# Patient Record
Sex: Female | Born: 1960 | Race: White | Hispanic: No | Smoking: Current every day smoker
Health system: Southern US, Community
[De-identification: ages and names within clinical notes are randomized; demographics above are authoritative.]

## PROBLEM LIST (undated history)

## (undated) DIAGNOSIS — C801 Malignant (primary) neoplasm, unspecified: Secondary | ICD-10-CM

## (undated) DIAGNOSIS — M1990 Unspecified osteoarthritis, unspecified site: Secondary | ICD-10-CM

## (undated) DIAGNOSIS — J4489 Other specified chronic obstructive pulmonary disease: Secondary | ICD-10-CM

## (undated) DIAGNOSIS — Z720 Tobacco use: Secondary | ICD-10-CM

## (undated) DIAGNOSIS — I10 Essential (primary) hypertension: Secondary | ICD-10-CM

## (undated) DIAGNOSIS — N951 Menopausal and female climacteric states: Secondary | ICD-10-CM

## (undated) DIAGNOSIS — Z8709 Personal history of other diseases of the respiratory system: Secondary | ICD-10-CM

## (undated) DIAGNOSIS — D649 Anemia, unspecified: Secondary | ICD-10-CM

## (undated) DIAGNOSIS — J449 Chronic obstructive pulmonary disease, unspecified: Secondary | ICD-10-CM

## (undated) DIAGNOSIS — F419 Anxiety disorder, unspecified: Secondary | ICD-10-CM

## (undated) DIAGNOSIS — R0600 Dyspnea, unspecified: Secondary | ICD-10-CM

## (undated) DIAGNOSIS — R011 Cardiac murmur, unspecified: Secondary | ICD-10-CM

## (undated) DIAGNOSIS — F329 Major depressive disorder, single episode, unspecified: Secondary | ICD-10-CM

## (undated) DIAGNOSIS — F32A Depression, unspecified: Secondary | ICD-10-CM

## (undated) DIAGNOSIS — E7800 Pure hypercholesterolemia, unspecified: Secondary | ICD-10-CM

## (undated) HISTORY — PX: LYMPH NODE BIOPSY: SHX201

## (undated) HISTORY — DX: Tobacco use: Z72.0

## (undated) HISTORY — DX: Anxiety disorder, unspecified: F41.9

## (undated) HISTORY — DX: Other specified chronic obstructive pulmonary disease: J44.89

## (undated) HISTORY — DX: Essential (primary) hypertension: I10

## (undated) HISTORY — PX: CERVICAL CONE BIOPSY: SUR198

## (undated) HISTORY — PX: TONSILLECTOMY: SUR1361

## (undated) HISTORY — PX: LIPOMA EXCISION: SHX5283

## (undated) HISTORY — DX: Chronic obstructive pulmonary disease, unspecified: J44.9

## (undated) HISTORY — PX: DILATION AND CURETTAGE OF UTERUS: SHX78

## (undated) HISTORY — DX: Pure hypercholesterolemia, unspecified: E78.00

## (undated) HISTORY — DX: Menopausal and female climacteric states: N95.1

---

## 2005-07-07 ENCOUNTER — Emergency Department: Payer: Self-pay | Admitting: Internal Medicine

## 2007-11-13 ENCOUNTER — Ambulatory Visit: Payer: Self-pay | Admitting: Internal Medicine

## 2007-11-13 LAB — PULMONARY FUNCTION TEST

## 2010-05-11 ENCOUNTER — Ambulatory Visit: Payer: Self-pay | Admitting: Internal Medicine

## 2010-10-03 DIAGNOSIS — E119 Type 2 diabetes mellitus without complications: Secondary | ICD-10-CM | POA: Insufficient documentation

## 2011-05-23 ENCOUNTER — Encounter: Payer: Self-pay | Admitting: Internal Medicine

## 2011-05-23 ENCOUNTER — Ambulatory Visit (INDEPENDENT_AMBULATORY_CARE_PROVIDER_SITE_OTHER): Payer: PRIVATE HEALTH INSURANCE | Admitting: Internal Medicine

## 2011-05-23 DIAGNOSIS — I10 Essential (primary) hypertension: Secondary | ICD-10-CM

## 2011-05-23 DIAGNOSIS — R5381 Other malaise: Secondary | ICD-10-CM

## 2011-05-23 DIAGNOSIS — J449 Chronic obstructive pulmonary disease, unspecified: Secondary | ICD-10-CM

## 2011-05-23 DIAGNOSIS — E785 Hyperlipidemia, unspecified: Secondary | ICD-10-CM

## 2011-05-23 DIAGNOSIS — R5383 Other fatigue: Secondary | ICD-10-CM

## 2011-05-23 MED ORDER — CITALOPRAM HYDROBROMIDE 20 MG PO TABS: 20.0000 mg | ORAL_TABLET | Freq: Every day | ORAL | Status: DC

## 2011-05-23 MED ORDER — PREDNISONE (PAK) 10 MG PO TABS: ORAL_TABLET | ORAL | Status: AC

## 2011-05-23 MED ORDER — RAMIPRIL 10 MG PO TABS: 10.0000 mg | ORAL_TABLET | Freq: Every day | ORAL | Status: DC

## 2011-05-23 MED ORDER — METHYLPREDNISOLONE SODIUM SUCC 40 MG IJ SOLR: 40.0000 mg | Freq: Once | INTRAMUSCULAR | Status: DC

## 2011-05-23 MED ORDER — ALBUTEROL SULFATE HFA 108 (90 BASE) MCG/ACT IN AERS: 2.0000 | INHALATION_SPRAY | Freq: Four times a day (QID) | RESPIRATORY_TRACT | Status: DC | PRN

## 2011-05-23 MED ORDER — METHYLPREDNISOLONE ACETATE 40 MG/ML IJ SUSP
40.0000 mg | Freq: Once | INTRAMUSCULAR | Status: AC
  Administered 2011-05-23: 40 mg via INTRAMUSCULAR

## 2011-05-23 MED ORDER — FLUTICASONE-SALMETEROL 250-50 MCG/DOSE IN AEPB: 1.0000 | INHALATION_SPRAY | Freq: Two times a day (BID) | RESPIRATORY_TRACT | Status: DC

## 2011-05-23 MED ORDER — ALBUTEROL SULFATE (5 MG/ML) 0.5% IN NEBU: 2.5000 mg | INHALATION_SOLUTION | Freq: Four times a day (QID) | RESPIRATORY_TRACT | Status: DC | PRN

## 2011-05-23 MED ORDER — DOXYCYCLINE HYCLATE 100 MG PO TABS: 100.0000 mg | ORAL_TABLET | Freq: Two times a day (BID) | ORAL | Status: AC

## 2011-05-23 NOTE — Progress Notes (Signed)
  Subjective:    Patient ID: Christine Sparks, female    DOB: 12-Aug-1961, 50 y.o.   MRN: [id number]  HPI Current symptoms include acute dyspnea, cough productive of yellow sputum in small amounts and wheezing. Symptoms have been present since several weeks ago and have been unchanged. She denies dyspnea, cough, fatigue and increased sputum. Associated symptoms include poor exercise tolerance and wheezing.  This episode appears to have been triggered by heat. Treatments tried for the current exacerbation: albuterol inhaler. The patient has been having similar episodes for approximately 5 years.   Review of Systems  Constitutional: Positive for activity change and fatigue.  HENT: Negative for ear pain, congestion, neck pain and sinus pressure.   Eyes: Negative for pain.  Respiratory: Positive for cough, chest tightness, shortness of breath and wheezing.   Cardiovascular: Negative for chest pain, palpitations and leg swelling.  Gastrointestinal: Negative for constipation, blood in stool and abdominal distention.  Genitourinary: Negative for urgency and frequency.  Musculoskeletal: Negative for myalgias and back pain.  Skin: Negative for pallor and rash.  Neurological: Negative for dizziness, syncope and light-headedness.  Hematological: Negative for adenopathy. Does not bruise/bleed easily.  Psychiatric/Behavioral: Negative for confusion and sleep disturbance.       Objective:   Physical Exam  Constitutional: She is oriented to person, place, and time. She appears well-developed and well-nourished. No distress.  HENT:  Head: Normocephalic.  Eyes: EOM are normal. Pupils are equal, round, and reactive to light.  Neck: Neck supple. No JVD present. No thyromegaly present.  Cardiovascular: Normal rate, regular rhythm and normal heart sounds.   Pulmonary/Chest: Effort normal. No stridor. No respiratory distress. She has wheezes. She has no rales.       Diminished bs bilaterally  Abdominal: Soft.  Bowel sounds are normal.  Musculoskeletal: She exhibits no edema.  Lymphadenopathy:    She has no cervical adenopathy.  Neurological: She is alert and oriented to person, place, and time.  Skin: Skin is warm and dry. She is not diaphoretic.  Psychiatric: She has a normal mood and affect.          Assessment & Plan:  COPD  Exacerbation:  Steroids, bronchodilators, and doxycycline rx/d.  CXR ordered due to tobacco history.  Hypertension:  continue ramipril for goal 130/80. No changes today.

## 2011-05-27 ENCOUNTER — Ambulatory Visit: Payer: Self-pay | Admitting: Internal Medicine

## 2011-05-27 ENCOUNTER — Telehealth: Payer: Self-pay | Admitting: Internal Medicine

## 2011-05-27 MED ORDER — AZITHROMYCIN 500 MG PO TABS: 500.0000 mg | ORAL_TABLET | Freq: Every day | ORAL | Status: AC

## 2011-05-27 NOTE — Telephone Encounter (Signed)
Instead of the dixycyline, you can call her in azithromycin 500 mg one tablet daily for 7 days  #7

## 2011-05-27 NOTE — Telephone Encounter (Signed)
Patient notified- Rx called to pharmacy. 

## 2011-05-27 NOTE — Telephone Encounter (Signed)
Patient called and stated she can't take anymore of the doxycycline you prescribed on Monday because it has made her extremely constipated, then today she has had diarrhea.  She stated she still needs something called in for the sinus infection it was originally prescribed for.  Please advise

## 2011-05-30 ENCOUNTER — Encounter: Payer: Self-pay | Admitting: Internal Medicine

## 2011-06-17 ENCOUNTER — Encounter: Payer: Self-pay | Admitting: Internal Medicine

## 2011-07-06 ENCOUNTER — Other Ambulatory Visit: Payer: Self-pay | Admitting: Internal Medicine

## 2011-07-06 MED ORDER — LORAZEPAM 0.5 MG PO TABS: 0.5000 mg | ORAL_TABLET | Freq: Two times a day (BID) | ORAL | Status: DC | PRN

## 2011-07-27 ENCOUNTER — Other Ambulatory Visit: Payer: Self-pay | Admitting: Internal Medicine

## 2011-09-08 ENCOUNTER — Ambulatory Visit: Payer: Self-pay | Admitting: General Practice

## 2011-09-23 ENCOUNTER — Other Ambulatory Visit: Payer: Self-pay | Admitting: *Deleted

## 2011-09-23 MED ORDER — LORAZEPAM 0.5 MG PO TABS: 0.5000 mg | ORAL_TABLET | Freq: Two times a day (BID) | ORAL | Status: DC | PRN

## 2011-09-23 NOTE — Telephone Encounter (Signed)
rx called in to pharamcy 

## 2011-09-23 NOTE — Telephone Encounter (Signed)
Yes you can refill the ativan as listed in chart with #3 refills

## 2011-09-23 NOTE — Telephone Encounter (Signed)
Patient is requesting a refill of Ativan.  Is this okay to call in?

## 2011-11-07 ENCOUNTER — Other Ambulatory Visit: Payer: Self-pay | Admitting: *Deleted

## 2011-11-07 ENCOUNTER — Encounter: Payer: Self-pay | Admitting: Internal Medicine

## 2011-11-07 ENCOUNTER — Ambulatory Visit (INDEPENDENT_AMBULATORY_CARE_PROVIDER_SITE_OTHER): Payer: PRIVATE HEALTH INSURANCE | Admitting: Internal Medicine

## 2011-11-07 DIAGNOSIS — F419 Anxiety disorder, unspecified: Secondary | ICD-10-CM | POA: Insufficient documentation

## 2011-11-07 DIAGNOSIS — I10 Essential (primary) hypertension: Secondary | ICD-10-CM | POA: Insufficient documentation

## 2011-11-07 DIAGNOSIS — A499 Bacterial infection, unspecified: Secondary | ICD-10-CM

## 2011-11-07 DIAGNOSIS — N951 Menopausal and female climacteric states: Secondary | ICD-10-CM

## 2011-11-07 DIAGNOSIS — J329 Chronic sinusitis, unspecified: Secondary | ICD-10-CM | POA: Insufficient documentation

## 2011-11-07 DIAGNOSIS — B9689 Other specified bacterial agents as the cause of diseases classified elsewhere: Secondary | ICD-10-CM

## 2011-11-07 DIAGNOSIS — J4489 Other specified chronic obstructive pulmonary disease: Secondary | ICD-10-CM | POA: Insufficient documentation

## 2011-11-07 DIAGNOSIS — F17200 Nicotine dependence, unspecified, uncomplicated: Secondary | ICD-10-CM

## 2011-11-07 DIAGNOSIS — Z720 Tobacco use: Secondary | ICD-10-CM | POA: Insufficient documentation

## 2011-11-07 DIAGNOSIS — J449 Chronic obstructive pulmonary disease, unspecified: Secondary | ICD-10-CM | POA: Insufficient documentation

## 2011-11-07 DIAGNOSIS — E119 Type 2 diabetes mellitus without complications: Secondary | ICD-10-CM

## 2011-11-07 MED ORDER — AMOXICILLIN-POT CLAVULANATE 875-125 MG PO TABS: 1.0000 | ORAL_TABLET | Freq: Two times a day (BID) | ORAL | Status: AC

## 2011-11-07 MED ORDER — RAMIPRIL 10 MG PO TABS: 10.0000 mg | ORAL_TABLET | Freq: Every day | ORAL | Status: DC

## 2011-11-07 MED ORDER — BECLOMETHASONE DIPROPIONATE 80 MCG/ACT IN AERS: 1.0000 | INHALATION_SPRAY | Freq: Two times a day (BID) | RESPIRATORY_TRACT | Status: DC

## 2011-11-07 MED ORDER — PREDNISONE (PAK) 10 MG PO TABS: ORAL_TABLET | ORAL | Status: AC

## 2011-11-07 MED ORDER — LORAZEPAM 0.5 MG PO TABS: 0.5000 mg | ORAL_TABLET | Freq: Two times a day (BID) | ORAL | Status: DC | PRN

## 2011-11-07 NOTE — Assessment & Plan Note (Signed)
With congestion rhinitis conjunctivitis lalsting several weeks. Dictated by ongoing tobacco use. Prednisone given concurrent wheezing, Augmentin, and decongestants.

## 2011-11-07 NOTE — Progress Notes (Signed)
  Subjective:    Patient ID: Christine Sparks, female    DOB: 07/15/1961, 51 y.o.   MRN: [id number]  HPI  Christine Sparks is a 51 year old white female with a history of tobacco abuse and chronic bronchitis who presents with cough productive of clear sputum bowels and new onset conjunctivitis she was treated. She was treated in November for influenza by the health department doctor with apparently several rounds of antibiotics but not with Tamiflu. Patient does not remember what the final antibiotic was. Her other symptoms have improved but she continues to have a productive cough and rales. Since then she has become intolerant of all things scented and has had to remove them from her work area tissue resulting congestion and sneezing. She has no prior history of allergy testing. Current Outpatient Prescriptions on File Prior to Visit  Medication Sig Dispense Refill  . albuterol (PROVENTIL) (5 MG/ML) 0.5% nebulizer solution Take 2.5 mg by nebulization every 6 (six) hours as needed for wheezing.  20 mL  12  . albuterol (VENTOLIN HFA) 108 (90 BASE) MCG/ACT inhaler Inhale 2 puffs into the lungs every 6 (six) hours as needed.  3 Inhaler  3  . Norethindrone ([name] PO) Take 1 tablet by mouth daily.           Review of Systems  HENT: Positive for congestion, sneezing and postnasal drip.   Respiratory: Positive for cough and chest tightness.        Objective:   Physical Exam  Constitutional: She is oriented to person, place, and time. She appears well-developed and well-nourished.  HENT:  Mouth/Throat: Oropharynx is clear and moist.  Eyes: EOM are normal. Pupils are equal, round, and reactive to light. No scleral icterus.  Neck: Normal range of motion. Neck supple. No JVD present. No thyromegaly present.  Cardiovascular: Normal rate, regular rhythm, normal heart sounds and intact distal pulses.   Pulmonary/Chest: Effort normal. She has wheezes. She has rales.  Abdominal: Soft. Bowel sounds are normal. She  exhibits no mass. There is no tenderness.  Musculoskeletal: Normal range of motion. She exhibits no edema.  Lymphadenopathy:    She has no cervical adenopathy.  Neurological: She is alert and oriented to person, place, and time.  Skin: Skin is warm and dry.  Psychiatric: She has a normal mood and affect.          Assessment & Plan:  a

## 2011-11-07 NOTE — Assessment & Plan Note (Signed)
She was diagnosed with diabetes with a fasting glucose of 155 back in January 2012. However the time her hemoglobin A1c was 5.7. This will need to be repeated.

## 2011-11-07 NOTE — Assessment & Plan Note (Signed)
She continues to take the mellitus chronic by her gynecologist, for menopausal symptoms and understands the increased risk in using birth control/hormonal therapy while using tobacco products after age 51; specifically, DVTs and stroke.

## 2011-11-07 NOTE — Telephone Encounter (Signed)
Ativan called to pharmacy.

## 2011-11-07 NOTE — Assessment & Plan Note (Signed)
Secondary to chronic tobacco abuse with a history of asthma as an adolescent. She uses Symbicort but not regularly. Has not had pulmonary function tests in several years. She's not interested in pursuing this at this time

## 2011-11-07 NOTE — Patient Instructions (Signed)
You have chronic bronchitis and sinusitis/otitis   Try generic zyrtec (cetirizine) once daily at bedtime for allergies  Prednisone taper and augmentin for the sinuses and ears  Spiriva once daily and Qvar (twice daily) for the lungs

## 2011-11-07 NOTE — Assessment & Plan Note (Signed)
She has been repeatedly counseled on the risks of tobacco use and is not interested in quitting.

## 2011-11-10 ENCOUNTER — Telehealth: Payer: Self-pay | Admitting: Internal Medicine

## 2011-11-10 ENCOUNTER — Other Ambulatory Visit: Payer: Self-pay | Admitting: Internal Medicine

## 2011-11-10 DIAGNOSIS — E569 Vitamin deficiency, unspecified: Secondary | ICD-10-CM

## 2011-11-10 MED ORDER — ERGOCALCIFEROL 1.25 MG (50000 UT) PO CAPS: 50000.0000 [IU] | ORAL_CAPSULE | ORAL | Status: AC

## 2011-11-10 NOTE — Telephone Encounter (Signed)
Recent labs were fine except vitamin D which was very low at 14 normal is 30 or above I would like to prescribe him a dose of vitamin D for her to take weekly for the next 2 months and basically throughout the winter followed by over-the-counter vitamin D 1000 units daily after a two-month period I will send a prescription to her drugstore.

## 2011-11-11 NOTE — Telephone Encounter (Signed)
Left message asking patient to return my call.

## 2011-11-23 NOTE — Telephone Encounter (Signed)
Patient notified

## 2012-01-23 ENCOUNTER — Telehealth: Payer: Self-pay | Admitting: Internal Medicine

## 2012-01-23 ENCOUNTER — Other Ambulatory Visit: Payer: Self-pay | Admitting: Internal Medicine

## 2012-01-23 MED ORDER — LORAZEPAM 0.5 MG PO TABS: 0.5000 mg | ORAL_TABLET | Freq: Two times a day (BID) | ORAL | Status: DC | PRN

## 2012-01-23 NOTE — Telephone Encounter (Signed)
Refill in epic,  Please phone in

## 2012-01-23 NOTE — Telephone Encounter (Signed)
Rx called to pharmacy

## 2012-01-23 NOTE — Telephone Encounter (Signed)
[phone] Pt called needs refill on adivant she has been out since yesterday She contacted her pharmacy   [facility] Please advise pt Pt is completely out of meds

## 2012-05-31 ENCOUNTER — Other Ambulatory Visit: Payer: Self-pay | Admitting: *Deleted

## 2012-05-31 NOTE — Telephone Encounter (Signed)
Refill on ventolin HFA 108 (90 base MCG act inhaler),Lexapro 20 mg, Ativan 0.5 mg and Ramipril 10 mg.

## 2012-06-01 ENCOUNTER — Other Ambulatory Visit: Payer: Self-pay | Admitting: Internal Medicine

## 2012-06-01 MED ORDER — CITALOPRAM HYDROBROMIDE 20 MG PO TABS: 20.0000 mg | ORAL_TABLET | Freq: Every day | ORAL | Status: DC

## 2012-06-01 MED ORDER — ALBUTEROL SULFATE HFA 108 (90 BASE) MCG/ACT IN AERS: 2.0000 | INHALATION_SPRAY | Freq: Four times a day (QID) | RESPIRATORY_TRACT | Status: DC | PRN

## 2012-06-01 MED ORDER — RAMIPRIL 10 MG PO TABS: 10.0000 mg | ORAL_TABLET | Freq: Every day | ORAL | Status: DC

## 2012-06-01 MED ORDER — LORAZEPAM 0.5 MG PO TABS: 0.5000 mg | ORAL_TABLET | Freq: Two times a day (BID) | ORAL | Status: DC | PRN

## 2012-06-01 NOTE — Addendum Note (Signed)
Addended by: Duncan Dull on: 06/01/2012 05:21 PM   Modules accepted: Orders

## 2012-06-01 NOTE — Telephone Encounter (Signed)
All refills authorized,  Lorazepam will print out

## 2012-08-09 ENCOUNTER — Ambulatory Visit (INDEPENDENT_AMBULATORY_CARE_PROVIDER_SITE_OTHER): Payer: PRIVATE HEALTH INSURANCE | Admitting: Internal Medicine

## 2012-08-09 ENCOUNTER — Ambulatory Visit: Payer: Self-pay | Admitting: Internal Medicine

## 2012-08-09 ENCOUNTER — Encounter: Payer: Self-pay | Admitting: Internal Medicine

## 2012-08-09 VITALS — BP 120/60 | HR 84 | Temp 98.4°F | Resp 12 | Ht 65.0 in | Wt 154.0 lb

## 2012-08-09 DIAGNOSIS — I10 Essential (primary) hypertension: Secondary | ICD-10-CM

## 2012-08-09 DIAGNOSIS — R059 Cough, unspecified: Secondary | ICD-10-CM

## 2012-08-09 DIAGNOSIS — F17200 Nicotine dependence, unspecified, uncomplicated: Secondary | ICD-10-CM

## 2012-08-09 DIAGNOSIS — R05 Cough: Secondary | ICD-10-CM

## 2012-08-09 DIAGNOSIS — M2550 Pain in unspecified joint: Secondary | ICD-10-CM

## 2012-08-09 DIAGNOSIS — F411 Generalized anxiety disorder: Secondary | ICD-10-CM

## 2012-08-09 DIAGNOSIS — E119 Type 2 diabetes mellitus without complications: Secondary | ICD-10-CM

## 2012-08-09 DIAGNOSIS — Z720 Tobacco use: Secondary | ICD-10-CM

## 2012-08-09 DIAGNOSIS — F419 Anxiety disorder, unspecified: Secondary | ICD-10-CM

## 2012-08-09 DIAGNOSIS — J449 Chronic obstructive pulmonary disease, unspecified: Secondary | ICD-10-CM

## 2012-08-09 MED ORDER — LORAZEPAM 0.5 MG PO TABS: 0.5000 mg | ORAL_TABLET | Freq: Two times a day (BID) | ORAL | Status: DC | PRN

## 2012-08-09 NOTE — Progress Notes (Addendum)
Patient ID: Christine Sparks, female   DOB: 08-24-1961, 51 y.o.   MRN: [id number]  Subjective:     Christine Sparks is a 51 y.o. female and is here for a comprehensive physical exam. The patient reports Having mammogram  and PAP to be done by DeFrancesco.  Last PAP 2012,  On progesterone [name] by DeFrancesco.  Some joint pain knees and PIPS and DIPS since painting her hosue.   Marland Kitchen  History   Social History  . Marital Status: Divorced    Spouse Name: N/A    Number of Children: N/A  . Years of Education: N/A   Occupational History  . Not on file.   Social History Main Topics  . Smoking status: Current Every Day Smoker -- 1.0 packs/day    Types: Cigarettes  . Smokeless tobacco: Never Used  . Alcohol Use: Yes     Comment: occasionally  . Drug Use: No  . Sexually Active: Not on file   Other Topics Concern  . Not on file   Social History Narrative  . No narrative on file   Health Maintenance  Topic Date Due  . Pap Smear  02/22/1977  . Mammogram  02/22/2009  . Colonoscopy  02/22/2009  . Influenza Vaccine  06/03/2013  . Tetanus/tdap  06/09/2022    The following portions of the patient's history were reviewed and updated as appropriate: allergies, current medications, past family history, past medical history, past social history, past surgical history and problem list.  Review of Systems A comprehensive review of systems was negative except for: Musculoskeletal: positive for stiff joints   Objective:      General appearance: alert, cooperative and appears stated age Ears: normal TM's and external ear canals both ears Throat: lips, mucosa, and tongue normal; teeth and gums normal Neck: no adenopathy, no carotid bruit, supple, symmetrical, trachea midline and thyroid not enlarged, symmetric, no tenderness/mass/nodules Back: symmetric, no curvature. ROM normal. No CVA tenderness. Lungs: clear to auscultation bilaterally Heart: regular rate and rhythm, S1, S2 normal, no murmur, click, rub or  gallop Abdomen: soft, non-tender; bowel sounds normal; no masses,  no organomegaly Pulses: 2+ and symmetric Skin: Skin color, texture, turgor normal. No rashes or lesions Lymph nodes: Cervical, supraclavicular, and axillary nodes normal.   Assessment:    Joint pain She has no signs of synovitis on exam. She does have Heberden's nodes and enlarged DIPs of both hands. Reassurance provided that her current symptoms appear to OA and rheumatoid arthritis  Hypertension Controlled on current regimen. No changes today.  Diabetes mellitus type 2, uncomplicated Well-controlled on diet alone. No changes today.  COPD (chronic obstructive pulmonary disease) The pulmonary function tests several years ago, with ongoing tobacco use. Chest x-ray done today shows COPD but no focal infiltrates are masses. Patient again counseled the risks of continued tobacco abuse urged to quit smoking  Anxiety Managed with citalopram and lorazepam.  Tobacco abuse Risks of continued tobacco use were discussed. She is not currently interested in tobacco cessation.    Updated Medication List Outpatient Encounter Prescriptions as of 08/09/2012  Medication Sig Dispense Refill  . albuterol (VENTOLIN HFA) 108 (90 BASE) MCG/ACT inhaler Inhale 2 puffs into the lungs every 6 (six) hours as needed.  3 Inhaler  3  . beclomethasone (QVAR) 80 MCG/ACT inhaler Inhale 1 puff into the lungs 2 (two) times daily.  1 Inhaler  12  . citalopram (CELEXA) 20 MG tablet Take 1 tablet (20 mg total) by mouth daily.  90 tablet  3  . citalopram (CELEXA) 20 MG tablet Take 1 tablet (20 mg total) by mouth daily.  30 tablet  3  . ergocalciferol (DRISDOL) 50000 UNITS capsule Take 1 capsule (50,000 Units total) by mouth once a week.  4 capsule  1  . LORazepam (ATIVAN) 0.5 MG tablet Take 1 tablet (0.5 mg total) by mouth 2 (two) times daily as needed. For anxiety  60 tablet  5  . Norethindrone ([name] PO) Take 1 tablet by mouth daily.        .  ramipril (ALTACE) 10 MG tablet Take 1 tablet (10 mg total) by mouth daily.  90 tablet  3  . [DISCONTINUED] LORazepam (ATIVAN) 0.5 MG tablet Take 1 tablet (0.5 mg total) by mouth 2 (two) times daily as needed. For anxiety  60 tablet  5  . albuterol (PROVENTIL) (5 MG/ML) 0.5% nebulizer solution Take 2.5 mg by nebulization every 6 (six) hours as needed for wheezing.  20 mL  12

## 2012-08-10 ENCOUNTER — Telehealth: Payer: Self-pay | Admitting: Internal Medicine

## 2012-08-10 NOTE — Telephone Encounter (Signed)
Patient notified via work phone .

## 2012-08-10 NOTE — Telephone Encounter (Signed)
Her chest x ray  Suggests COPD/emphysema,  i recommend she quit smoking

## 2012-08-11 ENCOUNTER — Encounter: Payer: Self-pay | Admitting: Internal Medicine

## 2012-08-11 DIAGNOSIS — M2550 Pain in unspecified joint: Secondary | ICD-10-CM | POA: Insufficient documentation

## 2012-08-11 NOTE — Assessment & Plan Note (Signed)
Managed with citalopram and lorazepam.

## 2012-08-11 NOTE — Assessment & Plan Note (Signed)
Risks of continued tobacco use were discussed. She is not currently interested in tobacco cessation.    

## 2012-08-11 NOTE — Assessment & Plan Note (Addendum)
The pulmonary function tests several years ago, with ongoing tobacco use. Chest x-ray done today shows COPD but no focal infiltrates are masses. Patient again counseled the risks of continued tobacco abuse urged to quit smoking

## 2012-08-11 NOTE — Assessment & Plan Note (Signed)
Well-controlled on diet alone. No changes today.

## 2012-08-11 NOTE — Assessment & Plan Note (Signed)
Controlled on current regimen. No changes today 

## 2012-08-11 NOTE — Assessment & Plan Note (Signed)
She has no signs of synovitis on exam. She does have Heberden's nodes and enlarged DIPs of both hands. Reassurance provided that her current symptoms appear to OA and rheumatoid arthritis

## 2012-08-14 ENCOUNTER — Telehealth: Payer: Self-pay | Admitting: Internal Medicine

## 2012-08-14 MED ORDER — LORAZEPAM 0.5 MG PO TABS
0.5000 mg | ORAL_TABLET | Freq: Two times a day (BID) | ORAL | Status: DC | PRN
Start: 2012-08-14 — End: 2012-12-03

## 2012-08-14 MED ORDER — RAMIPRIL 10 MG PO TABS: 10.0000 mg | ORAL_TABLET | Freq: Every day | ORAL | Status: DC

## 2012-08-14 MED ORDER — CITALOPRAM HYDROBROMIDE 20 MG PO TABS: 20.0000 mg | ORAL_TABLET | Freq: Every day | ORAL | Status: DC

## 2012-08-14 NOTE — Telephone Encounter (Signed)
Ok to refill,  Authorized in epic 

## 2012-08-14 NOTE — Telephone Encounter (Signed)
Refill on Celexa 20 mg, Ramipril 10 mg and Lorazepam 0.5 mg

## 2012-08-23 ENCOUNTER — Telehealth: Payer: Self-pay | Admitting: Internal Medicine

## 2012-08-23 NOTE — Telephone Encounter (Signed)
Her labs were normal except for her vitamin D level which was low at 23. He needs to take 2000 units of vitamin D 3 daily throughout the winter

## 2012-08-23 NOTE — Telephone Encounter (Signed)
Called patient home number, it was disconnected. Letter sent to patient.

## 2012-08-24 ENCOUNTER — Encounter: Payer: Self-pay | Admitting: Internal Medicine

## 2012-11-20 ENCOUNTER — Telehealth: Payer: Self-pay | Admitting: Internal Medicine

## 2012-11-20 MED ORDER — CITALOPRAM HYDROBROMIDE 20 MG PO TABS: 20.0000 mg | ORAL_TABLET | Freq: Every day | ORAL | Status: DC

## 2012-11-20 MED ORDER — RAMIPRIL 10 MG PO TABS: 10.0000 mg | ORAL_TABLET | Freq: Every day | ORAL | Status: DC

## 2012-11-20 NOTE — Telephone Encounter (Signed)
Rx sent 

## 2012-11-20 NOTE — Telephone Encounter (Signed)
Refill on Ramapril and Generic of Celexa. [facility] [place]

## 2012-11-26 ENCOUNTER — Telehealth: Payer: Self-pay | Admitting: Internal Medicine

## 2012-11-26 MED ORDER — RAMIPRIL 10 MG PO TABS: 10.0000 mg | ORAL_TABLET | Freq: Every day | ORAL | Status: DC

## 2012-11-26 NOTE — Telephone Encounter (Signed)
Pt calling to set up appt for her 6 mth BP follow up.  Pt states she needs refill on her BP medication now.  Did advise pt protocol is for her to call her pharmacy and have them fax the request for the refill.

## 2012-11-26 NOTE — Telephone Encounter (Signed)
Med filled.  

## 2012-12-03 ENCOUNTER — Other Ambulatory Visit: Payer: Self-pay | Admitting: Internal Medicine

## 2012-12-03 NOTE — Telephone Encounter (Signed)
Ok to refill,  Authorized in epic 

## 2012-12-03 NOTE — Telephone Encounter (Signed)
Pt called back to see about her lorazapam   [facility] Pt is completely out of her meds

## 2012-12-03 NOTE — Telephone Encounter (Signed)
Pt is completely out of Lorazapam and is needing a refill. [facility] [place]

## 2012-12-03 NOTE — Telephone Encounter (Signed)
Called into pharmacy as directed.

## 2012-12-03 NOTE — Telephone Encounter (Signed)
Received refill electronically from pharmacy. Is it okay to refill medication? 

## 2013-02-22 ENCOUNTER — Ambulatory Visit (INDEPENDENT_AMBULATORY_CARE_PROVIDER_SITE_OTHER): Payer: PRIVATE HEALTH INSURANCE | Admitting: Internal Medicine

## 2013-02-22 ENCOUNTER — Other Ambulatory Visit: Payer: Self-pay | Admitting: *Deleted

## 2013-02-22 ENCOUNTER — Encounter: Payer: Self-pay | Admitting: Internal Medicine

## 2013-02-22 VITALS — BP 138/76 | HR 81 | Temp 98.6°F | Resp 16 | Wt 155.5 lb

## 2013-02-22 DIAGNOSIS — N951 Menopausal and female climacteric states: Secondary | ICD-10-CM

## 2013-02-22 DIAGNOSIS — D649 Anemia, unspecified: Secondary | ICD-10-CM

## 2013-02-22 DIAGNOSIS — Z8541 Personal history of malignant neoplasm of cervix uteri: Secondary | ICD-10-CM

## 2013-02-22 DIAGNOSIS — Z720 Tobacco use: Secondary | ICD-10-CM

## 2013-02-22 DIAGNOSIS — F17200 Nicotine dependence, unspecified, uncomplicated: Secondary | ICD-10-CM

## 2013-02-22 DIAGNOSIS — R51 Headache: Secondary | ICD-10-CM

## 2013-02-22 DIAGNOSIS — R5383 Other fatigue: Secondary | ICD-10-CM

## 2013-02-22 DIAGNOSIS — J449 Chronic obstructive pulmonary disease, unspecified: Secondary | ICD-10-CM

## 2013-02-22 DIAGNOSIS — E538 Deficiency of other specified B group vitamins: Secondary | ICD-10-CM

## 2013-02-22 DIAGNOSIS — E119 Type 2 diabetes mellitus without complications: Secondary | ICD-10-CM

## 2013-02-22 DIAGNOSIS — I10 Essential (primary) hypertension: Secondary | ICD-10-CM

## 2013-02-22 DIAGNOSIS — D62 Acute posthemorrhagic anemia: Secondary | ICD-10-CM

## 2013-02-22 LAB — CBC WITH DIFFERENTIAL/PLATELET
Basophils Absolute: 0.1 10*3/uL (ref 0.0–0.1)
Eosinophils Relative: 3 % (ref 0.0–5.0)
Monocytes Absolute: 0.7 10*3/uL (ref 0.1–1.0)
Monocytes Relative: 8.8 % (ref 3.0–12.0)
Neutrophils Relative %: 57.7 % (ref 43.0–77.0)
Platelets: 412 10*3/uL — ABNORMAL HIGH (ref 150.0–400.0)
RDW: 13.8 % (ref 11.5–14.6)
WBC: 7.6 10*3/uL (ref 4.5–10.5)

## 2013-02-22 LAB — COMPREHENSIVE METABOLIC PANEL
Albumin: 4.1 g/dL (ref 3.5–5.2)
BUN: 11 mg/dL (ref 6–23)
CO2: 27 mEq/L (ref 19–32)
Calcium: 9.5 mg/dL (ref 8.4–10.5)
Chloride: 104 mEq/L (ref 96–112)
Glucose, Bld: 90 mg/dL (ref 70–99)
Potassium: 4.5 mEq/L (ref 3.5–5.1)
Total Protein: 6.6 g/dL (ref 6.0–8.3)

## 2013-02-22 LAB — SEDIMENTATION RATE: Sed Rate: 9 mm/hr (ref 0–22)

## 2013-02-22 LAB — TSH: TSH: 1.14 u[IU]/mL (ref 0.35–5.50)

## 2013-02-22 LAB — VITAMIN B12: Vitamin B-12: 139 pg/mL — ABNORMAL LOW (ref 211–911)

## 2013-02-22 MED ORDER — VARENICLINE TARTRATE 0.5 MG X 11 & 1 MG X 42 PO MISC: ORAL | Status: DC

## 2013-02-22 NOTE — Progress Notes (Signed)
Patient ID: Christine Sparks, female   DOB: 07/12/1961, 52 y.o.   MRN: [id number]   Patient Active Problem List   Diagnosis Date Noted  . History of cervical cancer 02/24/2013  . Joint pain 08/11/2012  . COPD (chronic obstructive pulmonary disease) 11/07/2011  . Anxiety   . Tobacco abuse   . Hypertension   . Menopausal syndrome   . Diabetes mellitus type 2, uncomplicated 10/03/2010    Subjective:  CC:   Chief Complaint  Patient presents with  . Follow-up     6 month    HPI:   Christine Sparks a 52 y.o. female who presents 6 month checkup.  Multiple issues to discuss.  Had GYN evaluation by DeFrancesco for pelvic exam and because of bloating had an ultrasound of ovaries,  which was normal.  Did not get her concerns addressed , however about her bi monthly weeklong episodes of bloating and fatigue  Accompanied by headaches, , which she reports feel similar to her prior premenstrual dysphoric episodes. The week occurs every other month  During the last week of her (progesterone only) birth control  Pack.   She has been taking  Camilla since 2006 for management of endometriosis and continues to smoke   2) Nonhealing skin lesion on right buttock near the natal cleft.  Shehad a skin biopsy of a  nonhealing lesion on right natal cleft that has been present for 3 years. She was initially treated by Dr. Orson Aloe with abx which caused diarrhea x 3 weeks,  And is now on her second round of famcyclovir that he has prescribed for herpes simplex.  She has not been siexaully active for years and has never had a genital ulcer or oral ulcer  3) Tobacco abuse.  She had a chest x ray several months ago whic noted hyperinflammation of lungs. For persistent cough.  She is contemplating quitting smoking.       Past Medical History  Diagnosis Date  . Anxiety   . Bronchitis, chronic obstructive   . Tobacco abuse   . Hypertension   . Menopausal syndrome   . Diabetes mellitus jan 2012    a1c 5.7, fasting  glu 155    History reviewed. No pertinent past surgical history.     The following portions of the patient's history were reviewed and updated as appropriate: Allergies, current medications, and problem list.    Review of Systems:   12 Pt  review of systems was negative except those addressed in the HPI,     History   Social History  . Marital Status: Divorced    Spouse Name: N/A    Number of Children: N/A  . Years of Education: N/A   Occupational History  . Not on file.   Social History Main Topics  . Smoking status: Current Every Day Smoker -- 1.00 packs/day    Types: Cigarettes  . Smokeless tobacco: Never Used  . Alcohol Use: Yes     Comment: occasionally  . Drug Use: No  . Sexually Active: Not on file   Other Topics Concern  . Not on file   Social History Narrative  . No narrative on file    Objective:  BP 138/76  Pulse 81  Temp(Src) 98.6 F (37 C) (Oral)  Resp 16  Wt 155 lb 8 oz (70.534 kg)  BMI 25.88 kg/m2  SpO2 98%  General appearance: alert, cooperative and appears stated age Ears: normal TM's and external ear canals both ears Throat: lips, mucosa,  and tongue normal; teeth and gums normal Neck: no adenopathy, no carotid bruit, supple, symmetrical, trachea midline and thyroid not enlarged, symmetric, no tenderness/mass/nodules Back: symmetric, no curvature. ROM normal. No CVA tenderness. Lungs: clear to auscultation bilaterally Heart: regular rate and rhythm, S1, S2 normal, no murmur, click, rub or gallop Abdomen: soft, non-tender; bowel sounds normal; no masses,  no organomegaly Pulses: 2+ and symmetric Skin: Skin color, texture, turgor normal. One small shallow ulcer right buttock lymph nodes: Cervical, supraclavicular, and axillary nodes normal.  Assessment and Plan:  Tobacco abuse The patient was counseled on the dangers of tobacco use, and was advised to quit.  Reviewed strategies to maximize success, including pharmacotherapy (with  Chantix).  COPD (chronic obstructive pulmonary disease) Suggested by CXR, no recent PFTS done per patient ambivalence. She does not have daily symptoms and uses albuterol rarely.   Menopausal syndrome Trial of diuretic for bloating and fatigue discussed.  If no imrovement suggest a trial of camilla suspension.   History of cervical cancer Annual PAP smears done by Dr. Audery Amel Dec 2013  Diabetes mellitus type 2, uncomplicated She has no had no subsequent fastings over 125,  And A1c remains low.  No meds . contineu ACE iNhibitor, annual eye exams, and monitor lipids.   Hypertension Well controlled on current regimen. Renal function stable, no changes today.   A total of 40 minutes was spent with patient more than half of which was spent in counseling, reviewing records from other prviders and coordination of care. Updated Medication List Outpatient Encounter Prescriptions as of 02/22/2013  Medication Sig Dispense Refill  . albuterol (VENTOLIN HFA) 108 (90 BASE) MCG/ACT inhaler Inhale 2 puffs into the lungs every 6 (six) hours as needed.  3 Inhaler  3  . citalopram (CELEXA) 20 MG tablet Take 1 tablet (20 mg total) by mouth daily.  30 tablet  3  . citalopram (CELEXA) 20 MG tablet Take 1 tablet (20 mg total) by mouth daily.  90 tablet  0  . LORazepam (ATIVAN) 0.5 MG tablet TAKE 1 TABLET BY MOUTH TWICE A DAY AS NEEDED  60 tablet  4  . Norethindrone ([name] PO) Take 1 tablet by mouth daily.        . ramipril (ALTACE) 10 MG tablet Take 1 tablet (10 mg total) by mouth daily.  90 tablet  0  . albuterol (PROVENTIL) (5 MG/ML) 0.5% nebulizer solution Take 2.5 mg by nebulization every 6 (six) hours as needed for wheezing.  20 mL  12  . beclomethasone (QVAR) 80 MCG/ACT inhaler Inhale 1 puff into the lungs 2 (two) times daily.  1 Inhaler  12  . varenicline (CHANTIX STARTING MONTH PAK) 0.5 MG X 11 & 1 MG X 42 tablet Take one 0.5 mg tablet by mouth once daily for 3 days, then increase to one 0.5 mg  tablet twice daily for 4 days, then increase to one 1 mg tablet twice daily.  53 tablet  0   No facility-administered encounter medications on file as of 02/22/2013.     Orders Placed This Encounter  Procedures  . TSH  . Iron and TIBC  . Ferritin  . Comprehensive metabolic panel  . Vitamin B12  . CBC with Differential  . Sedimentation rate  . Estradiol  . HM PAP SMEAR    No Follow-up on file.

## 2013-02-22 NOTE — Telephone Encounter (Signed)
Please advise to Ativan refill.

## 2013-02-22 NOTE — Patient Instructions (Addendum)
Before we stop the [place],  Try taking a diuretic (TBD) the week you anticipated the bloating and fatigue.  If this works,  We will continjue, If not,  We will suspend the [place]  For two months if tolerated

## 2013-02-23 LAB — ESTRADIOL: Estradiol: <11.8 pg/mL

## 2013-02-24 ENCOUNTER — Encounter: Payer: Self-pay | Admitting: Internal Medicine

## 2013-02-24 DIAGNOSIS — E538 Deficiency of other specified B group vitamins: Secondary | ICD-10-CM | POA: Insufficient documentation

## 2013-02-24 DIAGNOSIS — Z8541 Personal history of malignant neoplasm of cervix uteri: Secondary | ICD-10-CM | POA: Insufficient documentation

## 2013-02-24 NOTE — Addendum Note (Signed)
Addended by: Sherlene Shams on: 02/24/2013 01:53 PM   Modules accepted: Orders

## 2013-02-24 NOTE — Assessment & Plan Note (Signed)
She has no had no subsequent fastings over 125,  And A1c remains low.  No meds . contineu ACE iNhibitor, annual eye exams, and monitor lipids.

## 2013-02-24 NOTE — Assessment & Plan Note (Signed)
Trial of diuretic for bloating and fatigue discussed.  If no imrovement suggest a trial of camilla suspension.

## 2013-02-24 NOTE — Assessment & Plan Note (Signed)
Without anemia.  Needs supplementation and folic acid level checked.

## 2013-02-24 NOTE — Assessment & Plan Note (Signed)
The patient was counseled on the dangers of tobacco use, and was advised to quit.  Reviewed strategies to maximize success, including pharmacotherapy (with Chantix). 

## 2013-02-24 NOTE — Assessment & Plan Note (Signed)
Annual PAP smears done by Dr. Audery Amel Dec 2013

## 2013-02-24 NOTE — Assessment & Plan Note (Addendum)
Suggested by CXR, no recent PFTS done per patient ambivalence. She does not have daily symptoms and uses albuterol rarely.

## 2013-02-24 NOTE — Assessment & Plan Note (Signed)
Well controlled on current regimen. Renal function stable, no changes today. 

## 2013-02-25 MED ORDER — LORAZEPAM 0.5 MG PO TABS: ORAL_TABLET | ORAL | Status: DC

## 2013-02-25 MED ORDER — ALBUTEROL SULFATE HFA 108 (90 BASE) MCG/ACT IN AERS: 2.0000 | INHALATION_SPRAY | Freq: Four times a day (QID) | RESPIRATORY_TRACT | Status: DC | PRN

## 2013-02-25 MED ORDER — RAMIPRIL 10 MG PO TABS: 10.0000 mg | ORAL_TABLET | Freq: Every day | ORAL | Status: DC

## 2013-02-25 MED ORDER — CITALOPRAM HYDROBROMIDE 20 MG PO TABS: 20.0000 mg | ORAL_TABLET | Freq: Every day | ORAL | Status: DC

## 2013-02-26 NOTE — Telephone Encounter (Signed)
Medication faxed

## 2013-03-19 ENCOUNTER — Other Ambulatory Visit (INDEPENDENT_AMBULATORY_CARE_PROVIDER_SITE_OTHER): Payer: PRIVATE HEALTH INSURANCE

## 2013-03-19 ENCOUNTER — Ambulatory Visit (INDEPENDENT_AMBULATORY_CARE_PROVIDER_SITE_OTHER): Payer: PRIVATE HEALTH INSURANCE | Admitting: *Deleted

## 2013-03-19 DIAGNOSIS — E538 Deficiency of other specified B group vitamins: Secondary | ICD-10-CM

## 2013-03-19 MED ORDER — CYANOCOBALAMIN 1000 MCG/ML IJ SOLN
1000.0000 ug | Freq: Once | INTRAMUSCULAR | Status: AC
  Administered 2013-03-19: 1000 ug via INTRAMUSCULAR

## 2013-03-20 LAB — FOLATE RBC: RBC Folate: 731 ng/mL (ref >=366)

## 2013-03-26 ENCOUNTER — Ambulatory Visit (INDEPENDENT_AMBULATORY_CARE_PROVIDER_SITE_OTHER): Payer: PRIVATE HEALTH INSURANCE | Admitting: *Deleted

## 2013-03-26 DIAGNOSIS — E538 Deficiency of other specified B group vitamins: Secondary | ICD-10-CM

## 2013-03-26 MED ORDER — CYANOCOBALAMIN 1000 MCG/ML IJ SOLN
1000.0000 ug | Freq: Once | INTRAMUSCULAR | Status: AC
  Administered 2013-03-26: 1000 ug via INTRAMUSCULAR

## 2013-04-02 ENCOUNTER — Ambulatory Visit (INDEPENDENT_AMBULATORY_CARE_PROVIDER_SITE_OTHER): Payer: PRIVATE HEALTH INSURANCE | Admitting: *Deleted

## 2013-04-02 DIAGNOSIS — D649 Anemia, unspecified: Secondary | ICD-10-CM

## 2013-04-02 MED ORDER — CYANOCOBALAMIN 1000 MCG/ML IJ SOLN
1000.0000 ug | Freq: Once | INTRAMUSCULAR | Status: AC
  Administered 2013-04-02: 1000 ug via INTRAMUSCULAR

## 2013-04-22 ENCOUNTER — Telehealth: Payer: Self-pay | Admitting: Internal Medicine

## 2013-04-22 MED ORDER — RAMIPRIL 10 MG PO TABS: 10.0000 mg | ORAL_TABLET | Freq: Every day | ORAL | Status: DC

## 2013-04-22 MED ORDER — LORAZEPAM 0.5 MG PO TABS: ORAL_TABLET | ORAL | Status: DC

## 2013-04-22 MED ORDER — SPIRONOLACTONE 25 MG PO TABS: ORAL_TABLET | ORAL | Status: DC

## 2013-04-22 NOTE — Telephone Encounter (Signed)
Pt stated she went to drug store yesterday to pick up meds and [facility] they didn't have any refills for her and told her to call dr office Pt needs refill on all meds Also needed new rx for water retnention

## 2013-04-22 NOTE — Telephone Encounter (Signed)
Diuretic,  Spironolactone  sent to pharmacy. Directions once a day  as needed for fluid retention  Ok to refill  Lorazepam Authorized in epic and printed

## 2013-04-22 NOTE — Telephone Encounter (Signed)
Patient states she needs refill on ativan pharmacy states one refill remaining. Refill on BP med completed. Patient also stated you were going to prescribe an diuretic her last visit read in AV notes (Trial of diuretic for bloating and fatigue discussed. If no imrovement suggest a trial of camilla suspension) this is note from 02/22/13 please advise as to fill.

## 2013-04-23 NOTE — Telephone Encounter (Signed)
Patient notified as requested. 

## 2013-06-15 ENCOUNTER — Other Ambulatory Visit: Payer: Self-pay | Admitting: Internal Medicine

## 2013-08-12 ENCOUNTER — Telehealth: Payer: Self-pay | Admitting: Internal Medicine

## 2013-08-12 MED ORDER — LORAZEPAM 0.5 MG PO TABS: ORAL_TABLET | ORAL | Status: DC

## 2013-08-12 MED ORDER — SPIRONOLACTONE 25 MG PO TABS: ORAL_TABLET | ORAL | Status: DC

## 2013-08-12 MED ORDER — CITALOPRAM HYDROBROMIDE 20 MG PO TABS: 20.0000 mg | ORAL_TABLET | Freq: Every day | ORAL | Status: DC

## 2013-08-12 MED ORDER — RAMIPRIL 10 MG PO CAPS: 10.0000 mg | ORAL_CAPSULE | Freq: Every day | ORAL | Status: DC

## 2013-08-12 NOTE — Telephone Encounter (Signed)
Last OV 5/14 can we refill meds till CPE .

## 2013-08-12 NOTE — Telephone Encounter (Signed)
Patient notified

## 2013-08-12 NOTE — Telephone Encounter (Signed)
Pt just scheduled CPE for December but will need refills on B/P meds and Antidepressants now because she will run out by the end of this month.

## 2013-08-12 NOTE — Telephone Encounter (Signed)
Ok to refill,,  i sent in most meds per note

## 2013-09-04 ENCOUNTER — Telehealth: Payer: Self-pay | Admitting: *Deleted

## 2013-09-04 NOTE — Telephone Encounter (Signed)
Patient called and stated pharmacy did not have refills called pharmacy refills are at pharmacy waiting on patient.

## 2013-09-30 ENCOUNTER — Encounter: Payer: PRIVATE HEALTH INSURANCE | Admitting: Internal Medicine

## 2013-10-21 ENCOUNTER — Telehealth: Payer: Self-pay | Admitting: *Deleted

## 2013-10-22 ENCOUNTER — Encounter: Payer: PRIVATE HEALTH INSURANCE | Admitting: Internal Medicine

## 2013-10-22 NOTE — Telephone Encounter (Signed)
Error

## 2013-11-06 ENCOUNTER — Encounter: Payer: PRIVATE HEALTH INSURANCE | Admitting: Internal Medicine

## 2013-11-25 ENCOUNTER — Encounter: Payer: Self-pay | Admitting: Internal Medicine

## 2013-11-25 ENCOUNTER — Ambulatory Visit (INDEPENDENT_AMBULATORY_CARE_PROVIDER_SITE_OTHER): Payer: PRIVATE HEALTH INSURANCE | Admitting: Internal Medicine

## 2013-11-25 ENCOUNTER — Encounter (INDEPENDENT_AMBULATORY_CARE_PROVIDER_SITE_OTHER): Payer: Self-pay

## 2013-11-25 VITALS — BP 148/78 | HR 74 | Temp 97.7°F | Resp 16 | Wt 156.5 lb

## 2013-11-25 DIAGNOSIS — F419 Anxiety disorder, unspecified: Secondary | ICD-10-CM

## 2013-11-25 DIAGNOSIS — I10 Essential (primary) hypertension: Secondary | ICD-10-CM

## 2013-11-25 DIAGNOSIS — N951 Menopausal and female climacteric states: Secondary | ICD-10-CM

## 2013-11-25 DIAGNOSIS — Z0000 Encounter for general adult medical examination without abnormal findings: Secondary | ICD-10-CM

## 2013-11-25 DIAGNOSIS — Z1239 Encounter for other screening for malignant neoplasm of breast: Secondary | ICD-10-CM

## 2013-11-25 DIAGNOSIS — F17200 Nicotine dependence, unspecified, uncomplicated: Secondary | ICD-10-CM

## 2013-11-25 DIAGNOSIS — E119 Type 2 diabetes mellitus without complications: Secondary | ICD-10-CM

## 2013-11-25 DIAGNOSIS — Z23 Encounter for immunization: Secondary | ICD-10-CM

## 2013-11-25 DIAGNOSIS — Z1211 Encounter for screening for malignant neoplasm of colon: Secondary | ICD-10-CM

## 2013-11-25 DIAGNOSIS — F411 Generalized anxiety disorder: Secondary | ICD-10-CM

## 2013-11-25 DIAGNOSIS — Z720 Tobacco use: Secondary | ICD-10-CM

## 2013-11-25 MED ORDER — CITALOPRAM HYDROBROMIDE 40 MG PO TABS: 20.0000 mg | ORAL_TABLET | Freq: Every day | ORAL | Status: DC

## 2013-11-25 NOTE — Progress Notes (Signed)
Patient ID: Christine Sparks, female   DOB: [birth year], 53 y.o.   MRN: [id number]  Patient Active Problem List   Diagnosis Date Noted  . History of cervical cancer 02/24/2013  . B12 deficiency 02/24/2013  . Joint pain 08/11/2012  . COPD (chronic obstructive pulmonary disease) 11/07/2011  . Anxiety   . Tobacco abuse   . Hypertension   . Menopausal syndrome   . Diabetes mellitus type 2, uncomplicated 25/63/8937    Subjective:  CC:   Chief Complaint  Patient presents with  . Annual Exam    HPI:   Christine Sparks is a 53 y.o. female who presents for   Annual nongyn exam today. Had pelvic and breast exam by Dr D eFrancesco who also ordered her mammogram.  HOwever her  mammogram postponed due to work issues.   Had an emotional breakdown at work 2 week ago.  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  She changed desks which has helped. Became tearful today discussing the amount of stress she feels when recipients yell at her.  Anxiety/Depression:  Started Lexapro since 01/13/2005 when brother died .  currenlty on citalopram.  Not sure it is helping anymore.  Having to use lorazepam tw to three times daily  lately citalopram want s to increase it to 40 mg daily   Past Medical History  Diagnosis Date  . Anxiety   . Bronchitis, chronic obstructive   . Tobacco abuse   . Hypertension   . Menopausal syndrome   . Diabetes mellitus jan 2012    a1c 5.7, fasting glu 155    History reviewed. No pertinent past surgical history.     The following portions of the patient's history were reviewed and updated as appropriate: Allergies, current medications, and problem list.    Review of Systems:   Patient denies headache, fevers, malaise, unintentional weight loss, skin rash, Sparks pain, sinus congestion and sinus pain, sore throat, dysphagia,  hemoptysis , cough, dyspnea, wheezing, chest pain, palpitations, orthopnea, edema, abdominal pain, nausea, melena, diarrhea, constipation, flank  pain, dysuria, hematuria, urinary  Frequency, nocturia, numbness, tingling, seizures,  Focal weakness, Loss of consciousness,  Tremor, insomnia, depression, anxiety, and suicidal ideation.     History   Social History  . Marital Status: Divorced    Spouse Name: N/A    Number of Children: N/A  . Years of Education: N/A   Occupational History  . Not on file.   Social History Main Topics  . Smoking status: Current Every Day Smoker -- 1.00 packs/day    Types: Cigarettes  . Smokeless tobacco: Never Used  . Alcohol Use: Yes     Comment: occasionally  . Drug Use: No  . Sexual Activity: Not on file   Other Topics Concern  . Not on file   Social History Narrative  . No narrative on file    Objective:  Filed Vitals:   11/25/13 1608  BP: 148/78  Pulse: 74  Temp: 97.7 F (36.5 C)  Resp: 16     General appearance: alert, cooperative and appears stated age Ears: normal TM's and external ear canals both ears Throat: lips, mucosa, and tongue normal; teeth and gums normal Neck: no adenopathy, no carotid bruit, supple, symmetrical, trachea midline and thyroid not enlarged, symmetric, no tenderness/mass/nodules Back: symmetric, no curvature. ROM normal. No CVA tenderness. Lungs: clear to auscultation bilaterally Heart: regular rate and rhythm, S1, S2 normal, no murmur, click, rub or gallop Abdomen: soft, non-tender; bowel sounds normal; no masses,  no organomegaly Pulses: 2+ and symmetric Skin: Skin color, texture, turgor normal. No rashes or lesions Lymph nodes: Cervical, supraclavicular, and axillary nodes normal.  Assessment and Plan:  Tobacco abuse She was givne Chanitx rx after last visit but never started it.  Too anxious currently to consider quitting smoking .  Encouragement given   Menopausal syndrome Not a candidate for hormonal therapy due to tobacco abuse.   Anxiety Recommended trial of buspar given increased use of lorazepam,  But wants to try increasing  citalopram first to 40 mg daily for 30 days.   Diabetes mellitus type 2, uncomplicated By fasting glucose previously.  Low glycemic diet discussed,  Repeat A1c due.   Hypertension Well controlled on current regimen. Renal function due, no changes today.    Encounter for preventive health examination Annual comprehensive exam was done excluding breast, pelvic and PAP smear. All screenings have been addressed .    Updated Medication List Outpatient Encounter Prescriptions as of 11/25/2013  Medication Sig  . albuterol (VENTOLIN HFA) 108 (90 BASE) MCG/ACT inhaler Inhale 2 puffs into the lungs every 6 (six) hours as needed.  . citalopram (CELEXA) 40 MG tablet Take 0.5 tablets (20 mg total) by mouth daily.  Marland Kitchen LORazepam (ATIVAN) 0.5 MG tablet TAKE 1 TABLET BY MOUTH TWICE A DAY AS NEEDED  . Norethindrone ([name] PO) Take 1 tablet by mouth daily.    . ramipril (ALTACE) 10 MG capsule Take 1 capsule (10 mg total) by mouth daily.  Marland Kitchen spironolactone (ALDACTONE) 25 MG tablet TAKE 1 TABLET BY MOUTH EVERY DAY AS NEEDED FOR FLUID RETENTION  . [DISCONTINUED] citalopram (CELEXA) 20 MG tablet Take 1 tablet (20 mg total) by mouth daily.  . [DISCONTINUED] albuterol (PROVENTIL) (5 MG/ML) 0.5% nebulizer solution Take 2.5 mg by nebulization every 6 (six) hours as needed for wheezing.  . [DISCONTINUED] beclomethasone (QVAR) 80 MCG/ACT inhaler Inhale 1 puff into the lungs 2 (two) times daily.  . [DISCONTINUED] citalopram (CELEXA) 20 MG tablet Take 1 tablet (20 mg total) by mouth daily.  . [DISCONTINUED] ramipril (ALTACE) 10 MG tablet Take 1 tablet (10 mg total) by mouth daily.  . [DISCONTINUED] varenicline (CHANTIX STARTING MONTH PAK) 0.5 MG X 11 & 1 MG X 42 tablet Take one 0.5 mg tablet by mouth once daily for 3 days, then increase to one 0.5 mg tablet twice daily for 4 days, then increase to one 1 mg tablet twice daily.

## 2013-11-25 NOTE — Patient Instructions (Addendum)
We are increasing your citalopram to 40 mg daily immediately for your anxiety .   If you anxiety level does not respond in 30 days,  call me and we will try Plan B (buspirone [place] for anxiety)  Mammogram ordered    You are due for fasting labs ASAP

## 2013-11-26 ENCOUNTER — Encounter: Payer: Self-pay | Admitting: Internal Medicine

## 2013-11-26 ENCOUNTER — Telehealth: Payer: Self-pay | Admitting: Internal Medicine

## 2013-11-26 DIAGNOSIS — Z0000 Encounter for general adult medical examination without abnormal findings: Secondary | ICD-10-CM | POA: Insufficient documentation

## 2013-11-26 NOTE — Assessment & Plan Note (Signed)
She was givne Chanitx rx after last visit but never started it.  Too anxious currently to consider quitting smoking .  Encouragement given

## 2013-11-26 NOTE — Telephone Encounter (Signed)
Relevant patient education mailed to patient.  

## 2013-11-26 NOTE — Assessment & Plan Note (Signed)
Annual comprehensive exam was done excluding breast, pelvic and PAP smear. All screenings have been addressed .  

## 2013-11-26 NOTE — Assessment & Plan Note (Signed)
Recommended trial of buspar given increased use of lorazepam,  But wants to try increasing citalopram first to 40 mg daily for 30 days.

## 2013-11-26 NOTE — Assessment & Plan Note (Signed)
By fasting glucose previously.  Low glycemic diet discussed,  Repeat A1c due.

## 2013-11-26 NOTE — Assessment & Plan Note (Signed)
Well controlled on current regimen. Renal function due.,   no changes today. 

## 2013-11-26 NOTE — Assessment & Plan Note (Signed)
Not a candidate for hormonal therapy due to tobacco abuse.

## 2013-12-02 ENCOUNTER — Encounter: Payer: Self-pay | Admitting: Emergency Medicine

## 2013-12-20 ENCOUNTER — Telehealth: Payer: Self-pay | Admitting: Internal Medicine

## 2013-12-20 DIAGNOSIS — E559 Vitamin D deficiency, unspecified: Secondary | ICD-10-CM | POA: Insufficient documentation

## 2013-12-20 MED ORDER — ERGOCALCIFEROL 1.25 MG (50000 UT) PO CAPS: 50000.0000 [IU] | ORAL_CAPSULE | ORAL | Status: DC

## 2013-12-20 NOTE — Telephone Encounter (Signed)
No, the enema explains it. No more labs needed

## 2013-12-20 NOTE — Telephone Encounter (Signed)
Patient notified.,

## 2013-12-20 NOTE — Telephone Encounter (Signed)
Left message for patient to return call to office. 

## 2013-12-20 NOTE — Telephone Encounter (Signed)
Left message for patient to return my call.

## 2013-12-20 NOTE — Telephone Encounter (Signed)
Patient notified of results and said she has been taking enema for constipation , would like to know if she still needs repeat labs.

## 2013-12-20 NOTE — Telephone Encounter (Signed)
Labs are mostly normal including cholesterol,  But her phosphate is a little bit elevated AND her vitamin D is low.  Has she been taking laxatives or working out really strenuously? If not,  I would like to repeat the panel in [redacted] weeks along with the A1c they did not run (does she need  letter for CMET and A1c  For county to run tests?).  I will send rx for the low Vit D:  Drisdol 50,000 units weekly for one month, then start OTC D3,  1000 units units daily

## 2014-01-13 ENCOUNTER — Ambulatory Visit: Payer: Self-pay | Admitting: Internal Medicine

## 2014-01-18 ENCOUNTER — Other Ambulatory Visit: Payer: Self-pay | Admitting: Internal Medicine

## 2014-01-24 ENCOUNTER — Other Ambulatory Visit: Payer: Self-pay | Admitting: Internal Medicine

## 2014-02-18 ENCOUNTER — Encounter: Payer: Self-pay | Admitting: Internal Medicine

## 2014-02-24 ENCOUNTER — Other Ambulatory Visit: Payer: Self-pay | Admitting: Internal Medicine

## 2014-02-25 ENCOUNTER — Telehealth: Payer: Self-pay | Admitting: Internal Medicine

## 2014-02-25 NOTE — Telephone Encounter (Signed)
Pt states she has been without her lorazepam x2 days and is having side effects.  States pharmacy has faxed request and she recently had her cpe with Dr. Derrel Nip.  Asking if she can pick up lorazepam by lunchtime today.

## 2014-02-25 NOTE — Telephone Encounter (Signed)
Refill request received today. It has already been sent to Dr. Derrel Nip for approval, see other refill encounter note

## 2014-02-25 NOTE — Telephone Encounter (Signed)
Last visit 11/25/13, ok refill?

## 2014-02-25 NOTE — Telephone Encounter (Signed)
error 

## 2014-02-25 NOTE — Telephone Encounter (Signed)
Patient called to check on refill status. Patient stated that she is completely out of meds and really needs to get this refill today. Please call patient when rx ready/msn

## 2014-02-26 NOTE — Telephone Encounter (Signed)
Ok to refill,  printed rx .  Patient is free to find another doctor. They will all have the same 48 hour refill policy

## 2014-02-26 NOTE — Telephone Encounter (Signed)
Please advise ok to fill 

## 2014-02-26 NOTE — Telephone Encounter (Signed)
The patient stated she is feeling withdrawals and she is needing her medication called to the pharmacy today. She stated after this is over she will be changing doctors she will not go through this wait again.

## 2014-02-27 NOTE — Telephone Encounter (Signed)
Refill faxed 02/27/14

## 2014-03-06 ENCOUNTER — Encounter: Payer: Self-pay | Admitting: Internal Medicine

## 2014-05-20 ENCOUNTER — Telehealth: Payer: Self-pay | Admitting: Internal Medicine

## 2014-05-20 NOTE — Telephone Encounter (Addendum)
Patient dismissed from [facility] by Deborra Medina MD , effective March 06, 2014. Dismissal letter sent out by certified / registered mail. DAJ  Received signed domestic return receipt verifying delivery of certified letter on May 26, 2014. Article number [id number] DAJ

## 2014-06-17 ENCOUNTER — Other Ambulatory Visit: Payer: Self-pay | Admitting: Internal Medicine

## 2014-08-20 ENCOUNTER — Other Ambulatory Visit: Payer: Self-pay | Admitting: Internal Medicine

## 2014-09-29 ENCOUNTER — Other Ambulatory Visit: Payer: Self-pay | Admitting: Internal Medicine

## 2014-09-30 ENCOUNTER — Other Ambulatory Visit: Payer: Self-pay | Admitting: Internal Medicine

## 2015-05-06 ENCOUNTER — Other Ambulatory Visit: Payer: Self-pay | Admitting: Nurse Practitioner

## 2015-05-06 DIAGNOSIS — R319 Hematuria, unspecified: Secondary | ICD-10-CM

## 2015-05-12 ENCOUNTER — Ambulatory Visit
Admission: RE | Admit: 2015-05-12 | Discharge: 2015-05-12 | Disposition: A | Discharge status: Home / Self Care | Payer: PRIVATE HEALTH INSURANCE | Source: Ambulatory Visit | Attending: Nurse Practitioner | Admitting: Nurse Practitioner

## 2015-05-12 DIAGNOSIS — R319 Hematuria, unspecified: Secondary | ICD-10-CM | POA: Diagnosis not present

## 2015-06-15 ENCOUNTER — Other Ambulatory Visit: Payer: Self-pay | Admitting: Internal Medicine

## 2015-06-15 ENCOUNTER — Other Ambulatory Visit: Payer: Self-pay | Admitting: Nurse Practitioner

## 2015-06-15 DIAGNOSIS — Z1231 Encounter for screening mammogram for malignant neoplasm of breast: Secondary | ICD-10-CM

## 2015-06-22 ENCOUNTER — Ambulatory Visit
Admission: RE | Admit: 2015-06-22 | Discharge: 2015-06-22 | Disposition: A | Discharge status: Home / Self Care | Payer: PRIVATE HEALTH INSURANCE | Source: Ambulatory Visit | Attending: Nurse Practitioner | Admitting: Nurse Practitioner

## 2015-06-22 DIAGNOSIS — Z1231 Encounter for screening mammogram for malignant neoplasm of breast: Secondary | ICD-10-CM | POA: Insufficient documentation

## 2015-10-01 ENCOUNTER — Encounter: Payer: Self-pay | Admitting: Obstetrics and Gynecology

## 2015-12-02 ENCOUNTER — Ambulatory Visit: Payer: Self-pay

## 2015-12-02 DIAGNOSIS — Z299 Encounter for prophylactic measures, unspecified: Secondary | ICD-10-CM

## 2015-12-02 NOTE — Progress Notes (Unsigned)
Patient ID: Christine Sparks, female   DOB: 10/14/1960, 55 y.o.   MRN: [id number] Patient came in to have blood drawn per [facility] order.  Blood was drawn from the right arm without any incident.

## 2015-12-03 LAB — CMP12+LP+TP+TSH+6AC+CBC/D/PLT
ALBUMIN: 4.2 g/dL (ref 3.5–5.5)
ALK PHOS: 73 IU/L (ref 39–117)
ALT: 16 IU/L (ref 0–32)
AST: 20 IU/L (ref 0–40)
Albumin/Globulin Ratio: 1.8 (ref 1.1–2.5)
BASOS: 2 %
BILIRUBIN TOTAL: 0.3 mg/dL (ref 0.0–1.2)
BUN/Creatinine Ratio: 10 (ref 9–23)
BUN: 7 mg/dL (ref 6–24)
Basophils Absolute: 0.1 10*3/uL (ref 0.0–0.2)
Calcium: 9.7 mg/dL (ref 8.7–10.2)
Chloride: 97 mmol/L (ref 96–106)
Chol/HDL Ratio: 3.5 ratio units (ref 0.0–4.4)
Cholesterol, Total: 219 mg/dL — ABNORMAL HIGH (ref 100–199)
Creatinine, Ser: 0.68 mg/dL (ref 0.57–1.00)
EOS (ABSOLUTE): 0.3 10*3/uL (ref 0.0–0.4)
EOS: 5 %
Estimated CHD Risk: 0.6 times avg. (ref 0.0–1.0)
Free Thyroxine Index: 2.5 (ref 1.2–4.9)
GFR calc non Af Amer: 98 mL/min/{1.73_m2} (ref >59)
GFR, EST AFRICAN AMERICAN: 113 mL/min/{1.73_m2} (ref >59)
GGT: 33 IU/L (ref 0–60)
GLOBULIN, TOTAL: 2.4 g/dL (ref 1.5–4.5)
Glucose: 78 mg/dL (ref 65–99)
HDL: 63 mg/dL (ref >39)
HEMATOCRIT: 41.3 % (ref 34.0–46.6)
Hemoglobin: 13.9 g/dL (ref 11.1–15.9)
IMMATURE GRANS (ABS): 0 10*3/uL (ref 0.0–0.1)
IRON: 158 ug/dL (ref 27–159)
Immature Granulocytes: 0 %
LDH: 175 IU/L (ref 119–226)
LDL CALC: 117 mg/dL — AB (ref 0–99)
LYMPHS: 35 %
Lymphocytes Absolute: 1.9 10*3/uL (ref 0.7–3.1)
MCH: 31.9 pg (ref 26.6–33.0)
MCHC: 33.7 g/dL (ref 31.5–35.7)
MCV: 95 fL (ref 79–97)
MONOCYTES: 9 %
Monocytes Absolute: 0.5 10*3/uL (ref 0.1–0.9)
NEUTROS ABS: 2.5 10*3/uL (ref 1.4–7.0)
Neutrophils: 49 %
PLATELETS: 498 10*3/uL — AB (ref 150–379)
Phosphorus: 4.3 mg/dL (ref 2.5–4.5)
Potassium: 5.4 mmol/L — ABNORMAL HIGH (ref 3.5–5.2)
RBC: 4.36 x10E6/uL (ref 3.77–5.28)
RDW: 13.2 % (ref 12.3–15.4)
SODIUM: 138 mmol/L (ref 134–144)
T3 UPTAKE RATIO: 33 % (ref 24–39)
T4, Total: 7.6 ug/dL (ref 4.5–12.0)
TRIGLYCERIDES: 194 mg/dL — AB (ref 0–149)
TSH: 1.34 u[IU]/mL (ref 0.450–4.500)
Total Protein: 6.6 g/dL (ref 6.0–8.5)
Uric Acid: 4.9 mg/dL (ref 2.5–7.1)
VLDL CHOLESTEROL CAL: 39 mg/dL (ref 5–40)
WBC: 5.2 10*3/uL (ref 3.4–10.8)

## 2015-12-04 NOTE — Progress Notes (Signed)
Lab results were faxed to Kasandra Knudsen, NP at [facility] per patient's request.

## 2015-12-16 ENCOUNTER — Encounter: Payer: Self-pay | Admitting: Obstetrics and Gynecology

## 2016-03-04 ENCOUNTER — Other Ambulatory Visit: Payer: Self-pay

## 2016-03-04 DIAGNOSIS — Z299 Encounter for prophylactic measures, unspecified: Secondary | ICD-10-CM

## 2016-03-05 LAB — CMP12+LP+TP+TSH+6AC+CBC/D/PLT
A/G RATIO: 1.8 (ref 1.2–2.2)
ALK PHOS: 52 IU/L (ref 39–117)
ALT: 15 IU/L (ref 0–32)
AST: 15 IU/L (ref 0–40)
Albumin: 4.2 g/dL (ref 3.5–5.5)
BILIRUBIN TOTAL: 0.4 mg/dL (ref 0.0–1.2)
BUN / CREAT RATIO: 13 (ref 9–23)
BUN: 11 mg/dL (ref 6–24)
Basophils Absolute: 0.1 10*3/uL (ref 0.0–0.2)
Basos: 1 %
CHOL/HDL RATIO: 3 ratio (ref 0.0–4.4)
CREATININE: 0.88 mg/dL (ref 0.57–1.00)
Calcium: 9.4 mg/dL (ref 8.7–10.2)
Chloride: 99 mmol/L (ref 96–106)
Cholesterol, Total: 194 mg/dL (ref 100–199)
EOS (ABSOLUTE): 0.2 10*3/uL (ref 0.0–0.4)
EOS: 5 %
Estimated CHD Risk: <0.5 times avg. (ref 0.0–1.0)
Free Thyroxine Index: 2 (ref 1.2–4.9)
GFR, EST AFRICAN AMERICAN: 84 mL/min/{1.73_m2} (ref >59)
GFR, EST NON AFRICAN AMERICAN: 73 mL/min/{1.73_m2} (ref >59)
GGT: 18 IU/L (ref 0–60)
GLUCOSE: 84 mg/dL (ref 65–99)
Globulin, Total: 2.3 g/dL (ref 1.5–4.5)
HDL: 65 mg/dL (ref >39)
HEMATOCRIT: 42.6 % (ref 34.0–46.6)
HEMOGLOBIN: 13.8 g/dL (ref 11.1–15.9)
IMMATURE GRANS (ABS): 0 10*3/uL (ref 0.0–0.1)
Immature Granulocytes: 0 %
Iron: 122 ug/dL (ref 27–159)
LDH: 161 IU/L (ref 119–226)
LDL CALC: 107 mg/dL — AB (ref 0–99)
Lymphocytes Absolute: 1.5 10*3/uL (ref 0.7–3.1)
Lymphs: 28 %
MCH: 31.6 pg (ref 26.6–33.0)
MCHC: 32.4 g/dL (ref 31.5–35.7)
MCV: 98 fL — AB (ref 79–97)
MONOCYTES: 9 %
Monocytes Absolute: 0.5 10*3/uL (ref 0.1–0.9)
NEUTROS ABS: 3.1 10*3/uL (ref 1.4–7.0)
Neutrophils: 57 %
PHOSPHORUS: 4 mg/dL (ref 2.5–4.5)
POTASSIUM: 4.5 mmol/L (ref 3.5–5.2)
Platelets: 497 10*3/uL — ABNORMAL HIGH (ref 150–379)
RBC: 4.37 x10E6/uL (ref 3.77–5.28)
RDW: 13.5 % (ref 12.3–15.4)
Sodium: 139 mmol/L (ref 134–144)
T3 Uptake Ratio: 30 % (ref 24–39)
T4, Total: 6.7 ug/dL (ref 4.5–12.0)
TSH: 1.23 u[IU]/mL (ref 0.450–4.500)
Total Protein: 6.5 g/dL (ref 6.0–8.5)
Triglycerides: 109 mg/dL (ref 0–149)
URIC ACID: 3.2 mg/dL (ref 2.5–7.1)
VLDL CHOLESTEROL CAL: 22 mg/dL (ref 5–40)
WBC: 5.4 10*3/uL (ref 3.4–10.8)

## 2016-03-22 NOTE — Progress Notes (Signed)
Patient came in to have labs done per [facility] request.

## 2016-05-25 ENCOUNTER — Other Ambulatory Visit: Payer: Self-pay | Admitting: Nurse Practitioner

## 2016-05-25 DIAGNOSIS — Z1231 Encounter for screening mammogram for malignant neoplasm of breast: Secondary | ICD-10-CM

## 2016-06-22 ENCOUNTER — Ambulatory Visit
Admission: RE | Admit: 2016-06-22 | Discharge: 2016-06-22 | Disposition: A | Discharge status: Home / Self Care | Payer: Managed Care, Other (non HMO) | Source: Ambulatory Visit | Attending: Nurse Practitioner | Admitting: Nurse Practitioner

## 2016-06-22 ENCOUNTER — Other Ambulatory Visit: Payer: Self-pay | Admitting: Nurse Practitioner

## 2016-06-22 DIAGNOSIS — Z1231 Encounter for screening mammogram for malignant neoplasm of breast: Secondary | ICD-10-CM | POA: Diagnosis not present

## 2016-06-22 DIAGNOSIS — R928 Other abnormal and inconclusive findings on diagnostic imaging of breast: Secondary | ICD-10-CM | POA: Insufficient documentation

## 2016-06-30 ENCOUNTER — Other Ambulatory Visit: Payer: Self-pay | Admitting: Nurse Practitioner

## 2016-06-30 DIAGNOSIS — R922 Inconclusive mammogram: Secondary | ICD-10-CM

## 2016-06-30 DIAGNOSIS — N6022 Fibroadenosis of left breast: Secondary | ICD-10-CM

## 2016-07-04 NOTE — Progress Notes (Signed)
ANNUAL PREVENTATIVE CARE GYN  ENCOUNTER NOTE  Subjective:       Christine Sparks is a 55 y.o. No obstetric history on file. female here for a routine annual gynecologic exam.  Current complaints: 1.   None 2. Severe hot flashes  Patient presents for annual physical. She was last seen in December 2015. No major interval health issues except for worsening hot flashes which are intolerable. She is interested in proceeding with a trial of HRT therapy. Recent mammography was abnormal and she is scheduled for follow-up ultrasound and possible biopsy.  Bowel function is normal. Bladder function is notable for occasional stress incontinence as well as some urinary urgency. She does have nocturia 2. Patient continues to smoke.   Gynecologic History No LMP recorded. Patient is postmenopausal. Contraception: post menopausal status Last Pap: 09/2014 neg/neg. Results were: normal Last mammogram: birad 0 06/2016 possible distortion left breast u/s and dx scheduled for 07/13/2016. Results were: abn  Obstetric History OB History  No data available    Past Medical History:  Diagnosis Date  . Anxiety   . Bronchitis, chronic obstructive ([place])   . Diabetes mellitus jan 2012   a1c 5.7, fasting glu 155  . Hypertension   . Menopausal syndrome   . Tobacco abuse     No past surgical history on file.  Current Outpatient Prescriptions on File Prior to Visit  Medication Sig Dispense Refill  . citalopram (CELEXA) 40 MG tablet TAKE 0.5 TABLETS (20 MG TOTAL) BY MOUTH DAILY. 30 tablet 2  . LORazepam (ATIVAN) 0.5 MG tablet TAKE 1 TABLET BY MOUTH TWICE A DAY AS NEEDED 60 tablet 3  . Norethindrone ([name] PO) Take 1 tablet by mouth daily.      . ramipril (ALTACE) 10 MG capsule TAKE 1 CAPSULE (10 MG TOTAL) BY MOUTH DAILY. 90 capsule 2  . spironolactone (ALDACTONE) 25 MG tablet TAKE 1 TABLET BY MOUTH EVERY DAY AS NEEDED FOR FLUID RETENTION 30 tablet 1  . VENTOLIN HFA 108 (90 BASE) MCG/ACT inhaler INHALE 2 PUFFS  BY MOUTH EVERY 6 HOURS ASNEEDED 18 g 0  . Vitamin D, Ergocalciferol, (DRISDOL) 50000 UNITS CAPS capsule TAKE 1 CAPSULE (50,000 UNITS TOTAL) BY MOUTH ONCE A WEEK. 4 capsule 5   No current facility-administered medications on file prior to visit.     No Known Allergies  Social History   Social History  . Marital status: Divorced    Spouse name: N/A  . Number of children: N/A  . Years of education: N/A   Occupational History  . Not on file.   Social History Main Topics  . Smoking status: Current Every Day Smoker    Packs/day: 1.00    Types: Cigarettes  . Smokeless tobacco: Never Used  . Alcohol use Yes     Comment: occasionally  . Drug use: No  . Sexual activity: Not on file   Other Topics Concern  . Not on file   Social History Narrative  . No narrative on file    Family History  Problem Relation Age of Onset  . Cancer Mother     melanoma  . Cancer Father     Lung Cancer  . Early death Brother   . Heart disease Brother   . Breast cancer Paternal Aunt 51    The following portions of the patient's history were reviewed and updated as appropriate: allergies, current medications, past family history, past medical history, past social history, past surgical history and problem list.  Review  of Systems ROS Review of Systems - General ROS: negative for - chills, fatigue, fever, hot flashes, night sweats, weight gain or weight loss Psychological ROS: negative for - anxiety, decreased libido, depression, mood swings, physical abuse or sexual abuse Ophthalmic ROS: negative for - blurry vision, eye pain or loss of vision ENT ROS: negative for - headaches, hearing change, visual changes or vocal changes Allergy and Immunology ROS: negative for - hives, itchy/watery eyes or seasonal allergies Hematological and Lymphatic ROS: negative for - bleeding problems, bruising, swollen lymph nodes or weight loss Endocrine ROS: negative for - galactorrhea, hair pattern changes, hot  flashes, malaise/lethargy, mood swings, palpitations, polydipsia/polyuria, skin changes, temperature intolerance or unexpected weight changes Breast ROS: negative for - new or changing breast lumps or nipple discharge Respiratory ROS: negative for - cough or shortness of breath Cardiovascular ROS: negative for - chest pain, irregular heartbeat, palpitations or shortness of breath Gastrointestinal ROS: no abdominal pain, change in bowel habits, or black or bloody stools Genito-Urinary ROS: no dysuria, trouble voiding, or hematuria Musculoskeletal ROS: negative for - joint pain or joint stiffness Neurological ROS: negative for - bowel and bladder control changes Dermatological ROS: negative for rash and skin lesion changes   Objective:   BP 131/67   Pulse 76   Ht 5\' 6"  (1.676 m)   Wt 146 lb 8 oz (66.5 kg)   BMI 23.65 kg/m  CONSTITUTIONAL: Well-developed, well-nourished female in no acute distress.  PSYCHIATRIC: Normal mood and affect. Normal behavior. Normal judgment and thought content. [place]: Alert and oriented to person, place, and time. Normal muscle tone coordination. No cranial nerve deficit noted. HENT:  Normocephalic, atraumatic, External right and left ear normal. Oropharynx is clear and moist EYES: Conjunctivae and EOM are normal. Pupils are equal, round, and reactive to light. No scleral icterus.  NECK: Normal range of motion, supple, no masses.  Normal thyroid.  SKIN: Skin is warm and dry. No rash noted. Not diaphoretic. No erythema. No pallor. CARDIOVASCULAR: Normal heart rate noted, regular rhythm, no murmur. RESPIRATORY: Right lower lobe wheezes; breath sounds distant. Effort and breath sounds normal, no problems with respiration noted. BREASTS: Symmetric in size. No masses, skin changes, nipple drainage, or lymphadenopathy. ABDOMEN: Soft, normal bowel sounds, no distention noted.  No tenderness, rebound or guarding.  BLADDER: Normal PELVIC:  External Genitalia:  Normal  BUS: Normal  Vagina: Moderate to severe atrophy; introitus stenotic, single digit exam done  Cervix: Post-cone changes noted; nontender; mobile  Uterus: Normal; midplane, normal size and shape, nontender  Adnexa: Normal; nonpalpable and nontender  RV: External hemorrhoids, nonthrombosed, No Rectal Masses and Normal Sphincter tone  MUSCULOSKELETAL: Normal range of motion. No tenderness.  No cyanosis, clubbing, or edema.  2+ distal pulses. LYMPHATIC: No Axillary, Supraclavicular, or Inguinal Adenopathy.    Assessment:   Annual gynecologic examination 55 y.o. Contraception: post menopausal status Overweight Problem List Items Addressed This Visit    Tobacco abuse   Menopausal syndrome    Other Visit Diagnoses    Well woman exam with routine gynecological exam    -  Primary   Screening for colon cancer       History of endometriosis       Uterine leiomyoma, unspecified location       History of cone biopsy of cervix       Vaginal atrophy        External hemorrhoids, asymptomatic Vasomotor symptoms, moderate to severe; patient requests HRT trial Plan:  Pap: due 2018 Mammogram: thru  pcp Stool Guaiac Testing:  Ordered Labs: thru pcp Routine preventative health maintenance measures emphasized: Exercise/Diet/Weight control, Tobacco Warnings and Alcohol/Substance use risks Trial of estradiol 0.5 mg daily and Prometrium 100 mg daily Return in 6 weeks for follow-up Return to [place], CMA  Brayton Mars, MD  Note: This dictation was prepared with Dragon dictation along with smaller phrase technology. Any transcriptional errors that result from this process are unintentional.

## 2016-07-07 ENCOUNTER — Encounter: Payer: Self-pay | Admitting: Obstetrics and Gynecology

## 2016-07-07 ENCOUNTER — Ambulatory Visit (INDEPENDENT_AMBULATORY_CARE_PROVIDER_SITE_OTHER): Payer: Managed Care, Other (non HMO) | Admitting: Obstetrics and Gynecology

## 2016-07-07 VITALS — BP 131/67 | HR 76 | Ht 66.0 in | Wt 146.5 lb

## 2016-07-07 DIAGNOSIS — Z01419 Encounter for gynecological examination (general) (routine) without abnormal findings: Secondary | ICD-10-CM | POA: Insufficient documentation

## 2016-07-07 DIAGNOSIS — Z9889 Other specified postprocedural states: Secondary | ICD-10-CM | POA: Insufficient documentation

## 2016-07-07 DIAGNOSIS — Z720 Tobacco use: Secondary | ICD-10-CM

## 2016-07-07 DIAGNOSIS — K644 Residual hemorrhoidal skin tags: Secondary | ICD-10-CM | POA: Diagnosis not present

## 2016-07-07 DIAGNOSIS — D259 Leiomyoma of uterus, unspecified: Secondary | ICD-10-CM | POA: Diagnosis not present

## 2016-07-07 DIAGNOSIS — N952 Postmenopausal atrophic vaginitis: Secondary | ICD-10-CM | POA: Insufficient documentation

## 2016-07-07 DIAGNOSIS — Z1211 Encounter for screening for malignant neoplasm of colon: Secondary | ICD-10-CM | POA: Diagnosis not present

## 2016-07-07 DIAGNOSIS — Z8742 Personal history of other diseases of the female genital tract: Secondary | ICD-10-CM | POA: Diagnosis not present

## 2016-07-07 DIAGNOSIS — N951 Menopausal and female climacteric states: Secondary | ICD-10-CM

## 2016-07-07 MED ORDER — ESTRADIOL 0.5 MG PO TABS: 0.5000 mg | ORAL_TABLET | Freq: Every day | ORAL | 6 refills | Status: DC

## 2016-07-07 MED ORDER — PROGESTERONE MICRONIZED 100 MG PO CAPS: 100.0000 mg | ORAL_CAPSULE | Freq: Every day | ORAL | 6 refills | Status: DC

## 2016-07-07 NOTE — Patient Instructions (Signed)
1. No Pap smear is performed today 2. Mammogram follow-up is already scheduled 3. Stool guaiac cards are given for colon cancer screening 4. Recommend calcium with vitamin D supplementation 600 mg twice a day 5. Continue with healthy eating and exercise 6. Smoking cessation strongly encouraged 7. Trial of HRT in the form of estradiol 0.5 mg daily and Prometrium 100 mg daily 8. Return in 6 weeks for follow-up on HRT 9. Return in 1 year for annual exam

## 2016-07-11 ENCOUNTER — Other Ambulatory Visit: Payer: Self-pay

## 2016-07-11 DIAGNOSIS — Z299 Encounter for prophylactic measures, unspecified: Secondary | ICD-10-CM

## 2016-07-11 NOTE — Progress Notes (Signed)
Patient came in to have blood drawn for testing. 

## 2016-07-12 LAB — CMP12+LP+TP+TSH+6AC+CBC/D/PLT
A/G RATIO: 1.9 (ref 1.2–2.2)
ALK PHOS: 57 IU/L (ref 39–117)
ALT: 12 IU/L (ref 0–32)
AST: 19 IU/L (ref 0–40)
Albumin: 4.6 g/dL (ref 3.5–5.5)
BASOS: 1 %
BUN/Creatinine Ratio: 17 (ref 9–23)
BUN: 17 mg/dL (ref 6–24)
Basophils Absolute: 0.1 10*3/uL (ref 0.0–0.2)
Bilirubin Total: 0.3 mg/dL (ref 0.0–1.2)
CALCIUM: 9.4 mg/dL (ref 8.7–10.2)
CHOL/HDL RATIO: 3.5 ratio (ref 0.0–4.4)
Chloride: 96 mmol/L (ref 96–106)
Cholesterol, Total: 215 mg/dL — ABNORMAL HIGH (ref 100–199)
Creatinine, Ser: 0.98 mg/dL (ref 0.57–1.00)
EOS (ABSOLUTE): 0.3 10*3/uL (ref 0.0–0.4)
EOS: 3 %
Estimated CHD Risk: 0.6 times avg. (ref 0.0–1.0)
Free Thyroxine Index: 1.7 (ref 1.2–4.9)
GFR calc non Af Amer: 64 mL/min/{1.73_m2} (ref >59)
GFR, EST AFRICAN AMERICAN: 74 mL/min/{1.73_m2} (ref >59)
GGT: 18 IU/L (ref 0–60)
GLOBULIN, TOTAL: 2.4 g/dL (ref 1.5–4.5)
GLUCOSE: 95 mg/dL (ref 65–99)
HDL: 61 mg/dL (ref >39)
HEMATOCRIT: 41.4 % (ref 34.0–46.6)
HEMOGLOBIN: 13.5 g/dL (ref 11.1–15.9)
IMMATURE GRANS (ABS): 0 10*3/uL (ref 0.0–0.1)
Immature Granulocytes: 0 %
Iron: 118 ug/dL (ref 27–159)
LDH: 181 IU/L (ref 119–226)
LDL CALC: 126 mg/dL — AB (ref 0–99)
LYMPHS: 28 %
Lymphocytes Absolute: 2.5 10*3/uL (ref 0.7–3.1)
MCH: 31.3 pg (ref 26.6–33.0)
MCHC: 32.6 g/dL (ref 31.5–35.7)
MCV: 96 fL (ref 79–97)
MONOCYTES: 8 %
Monocytes Absolute: 0.7 10*3/uL (ref 0.1–0.9)
NEUTROS ABS: 5.4 10*3/uL (ref 1.4–7.0)
Neutrophils: 60 %
POTASSIUM: 4.7 mmol/L (ref 3.5–5.2)
Phosphorus: 5.1 mg/dL — ABNORMAL HIGH (ref 2.5–4.5)
Platelets: 500 10*3/uL — ABNORMAL HIGH (ref 150–379)
RBC: 4.32 x10E6/uL (ref 3.77–5.28)
RDW: 13.1 % (ref 12.3–15.4)
SODIUM: 138 mmol/L (ref 134–144)
T3 Uptake Ratio: 27 % (ref 24–39)
T4, Total: 6.4 ug/dL (ref 4.5–12.0)
TRIGLYCERIDES: 139 mg/dL (ref 0–149)
TSH: 1.03 u[IU]/mL (ref 0.450–4.500)
Total Protein: 7 g/dL (ref 6.0–8.5)
URIC ACID: 3.7 mg/dL (ref 2.5–7.1)
VLDL Cholesterol Cal: 28 mg/dL (ref 5–40)
WBC: 8.9 10*3/uL (ref 3.4–10.8)

## 2016-07-13 ENCOUNTER — Ambulatory Visit
Admission: RE | Admit: 2016-07-13 | Discharge: 2016-07-13 | Disposition: A | Discharge status: Home / Self Care | Payer: Managed Care, Other (non HMO) | Source: Ambulatory Visit | Attending: Nurse Practitioner | Admitting: Nurse Practitioner

## 2016-07-13 DIAGNOSIS — N6022 Fibroadenosis of left breast: Secondary | ICD-10-CM | POA: Diagnosis present

## 2016-07-14 ENCOUNTER — Other Ambulatory Visit: Payer: Self-pay | Admitting: Nurse Practitioner

## 2016-07-14 DIAGNOSIS — R928 Other abnormal and inconclusive findings on diagnostic imaging of breast: Secondary | ICD-10-CM

## 2016-07-26 ENCOUNTER — Ambulatory Visit
Admission: RE | Admit: 2016-07-26 | Discharge: 2016-07-26 | Disposition: A | Discharge status: Home / Self Care | Payer: Managed Care, Other (non HMO) | Source: Ambulatory Visit | Attending: Nurse Practitioner | Admitting: Nurse Practitioner

## 2016-07-26 DIAGNOSIS — R928 Other abnormal and inconclusive findings on diagnostic imaging of breast: Secondary | ICD-10-CM

## 2016-07-26 HISTORY — PX: BREAST BIOPSY: SHX20

## 2016-07-28 ENCOUNTER — Encounter: Payer: Self-pay | Admitting: *Deleted

## 2016-08-04 ENCOUNTER — Ambulatory Visit (INDEPENDENT_AMBULATORY_CARE_PROVIDER_SITE_OTHER): Payer: Managed Care, Other (non HMO) | Admitting: General Surgery

## 2016-08-04 ENCOUNTER — Encounter: Payer: Self-pay | Admitting: General Surgery

## 2016-08-04 ENCOUNTER — Other Ambulatory Visit: Payer: Self-pay | Admitting: General Surgery

## 2016-08-04 ENCOUNTER — Encounter: Payer: Self-pay | Admitting: *Deleted

## 2016-08-04 VITALS — BP 124/72 | HR 74 | Resp 12 | Ht 66.0 in | Wt 145.0 lb

## 2016-08-04 DIAGNOSIS — N6489 Other specified disorders of breast: Secondary | ICD-10-CM

## 2016-08-04 DIAGNOSIS — N6320 Unspecified lump in the left breast, unspecified quadrant: Secondary | ICD-10-CM | POA: Diagnosis not present

## 2016-08-04 NOTE — Progress Notes (Signed)
Patient ID: Christine Sparks, female   DOB: 08/25/1961, 55 y.o.   MRN: [id number]  Chief Complaint  Patient presents with  . Other    mammogram     HPI Christine Sparks is a 55 y.o. female who presents for a breast evaluation. The most recent mammogram was done on 06/22/2016 and added views on 07/13/2016. Left breast biopsy performed on 07/26/2016. Patient does perform regular self breast checks and gets regular mammograms.  No breast symptoms. Denies any abdominal complaints.     I have reviewed the history of present illness with the patient.  HPI  Past Medical History:  Diagnosis Date  . Anxiety   . Bronchitis, chronic obstructive ([place])   . Diabetes mellitus jan 2012   a1c 5.7, fasting glu 155  . High cholesterol   . Hypertension   . Menopausal syndrome   . Tobacco abuse     Past Surgical History:  Procedure Laterality Date  . CERVICAL CONE BIOPSY    . DILATION AND CURETTAGE OF UTERUS    . LYMPH NODE BIOPSY    . TONSILLECTOMY      Family History  Problem Relation Age of Onset  . Cancer Mother     melanoma  . Cancer Father     Lung Cancer  . Early death Brother   . Heart disease Brother   . Breast cancer Paternal Aunt 34  . Colon cancer Other   . Ovarian cancer Neg Hx   . Diabetes Neg Hx     Social History Social History  Substance Use Topics  . Smoking status: Current Every Day Smoker    Packs/day: 0.50    Types: Cigarettes  . Smokeless tobacco: Never Used  . Alcohol use Yes     Comment: occasionally    No Known Allergies  Current Outpatient Prescriptions  Medication Sig Dispense Refill  . citalopram (CELEXA) 40 MG tablet TAKE 0.5 TABLETS (20 MG TOTAL) BY MOUTH DAILY. 30 tablet 2  . estradiol (ESTRACE) 0.5 MG tablet Take 1 tablet (0.5 mg total) by mouth daily. 30 tablet 6  . fenofibrate (TRICOR) 145 MG tablet Take 145 mg by mouth daily.    Marland Kitchen LORazepam (ATIVAN) 0.5 MG tablet TAKE 1 TABLET BY MOUTH TWICE A DAY AS NEEDED 60 tablet 3  . progesterone  (PROMETRIUM) 100 MG capsule Take 1 capsule (100 mg total) by mouth daily. 30 capsule 6  . ramipril (ALTACE) 10 MG capsule TAKE 1 CAPSULE (10 MG TOTAL) BY MOUTH DAILY. 90 capsule 2  . VENTOLIN HFA 108 (90 BASE) MCG/ACT inhaler INHALE 2 PUFFS BY MOUTH EVERY 6 HOURS ASNEEDED 18 g 0   No current facility-administered medications for this visit.     Review of Systems Review of Systems  Constitutional: Negative.   Respiratory: Negative.   Cardiovascular: Negative.   Gastrointestinal: Negative.     Blood pressure 124/72, pulse 74, resp. rate 12, height 5\' 6"  (1.676 m), weight 145 lb (65.8 kg).  Physical Exam Physical Exam  Constitutional: She is oriented to person, place, and time. She appears well-developed and well-nourished.  Eyes: Conjunctivae are normal. No scleral icterus.  Neck: Neck supple.  Cardiovascular: Normal rate, regular rhythm and normal heart sounds.   Pulmonary/Chest: Effort normal. Right breast exhibits no inverted nipple, no mass, no nipple discharge, no skin change and no tenderness. Left breast exhibits no inverted nipple, no mass, no nipple discharge, no skin change and no tenderness.    Abdominal: Soft. Bowel sounds are normal.  There is no hepatomegaly. There is no tenderness.  Lymphadenopathy:    She has no cervical adenopathy.    She has no axillary adenopathy.  Neurological: She is alert and oriented to person, place, and time.  Skin: Skin is warm and dry.    Data Reviewed Mammogram results from 07/13/16 Breast biopsy results from 07/26/16  Assessment    Complex sclerosing lesion of left breast. Petra Kuba of this finding explained fully to pt.     Plan    Discussed risk of associated cancer with this type of lesion. Current recommendations state to perform excision, however, close follow-up is also an option.   Patient wishes to proceed with excision.    This information has been scribed by Gaspar Cola CMA.      Tajuan Dufault  G 08/04/2016, 12:17 PM

## 2016-08-04 NOTE — Progress Notes (Unsigned)
Patient's surgery has been scheduled for 08-17-16 at [facility].

## 2016-08-05 ENCOUNTER — Telehealth: Payer: Self-pay

## 2016-08-05 NOTE — Telephone Encounter (Signed)
Call to patient to give time and location for surgery. Unable to leave message.

## 2016-08-05 NOTE — Telephone Encounter (Signed)
-----   Message from Sherrie Sport sent at 08/05/2016 10:18 AM EDT ----- Christine Sparks is set to arrive at [facility] on 08/17/2016 at 11:15am  Thanks  ----- Message ----- From: Dominga Ferry, CMA Sent: 08/04/2016   3:29 PM To: Lesly Rubenstein, LPN, #  Patient needs to be scheduled for a left breast wire loc. Surgery is scheduled for 08-17-16 at 1 pm. Please let me know what time patient needs to arrive at [facility]. Thanks.

## 2016-08-09 NOTE — Telephone Encounter (Signed)
Patient notified of arrival time and location.  

## 2016-08-11 ENCOUNTER — Other Ambulatory Visit: Payer: Self-pay

## 2016-08-12 ENCOUNTER — Encounter
Admission: RE | Admit: 2016-08-12 | Discharge: 2016-08-12 | Disposition: A | Discharge status: Home / Self Care | Payer: Managed Care, Other (non HMO) | Source: Ambulatory Visit | Attending: General Surgery | Admitting: General Surgery

## 2016-08-12 DIAGNOSIS — E7800 Pure hypercholesterolemia, unspecified: Secondary | ICD-10-CM | POA: Diagnosis not present

## 2016-08-12 DIAGNOSIS — I10 Essential (primary) hypertension: Secondary | ICD-10-CM | POA: Diagnosis not present

## 2016-08-12 DIAGNOSIS — Z8249 Family history of ischemic heart disease and other diseases of the circulatory system: Secondary | ICD-10-CM | POA: Diagnosis not present

## 2016-08-12 DIAGNOSIS — F419 Anxiety disorder, unspecified: Secondary | ICD-10-CM | POA: Diagnosis not present

## 2016-08-12 DIAGNOSIS — F17210 Nicotine dependence, cigarettes, uncomplicated: Secondary | ICD-10-CM | POA: Diagnosis not present

## 2016-08-12 DIAGNOSIS — N6022 Fibroadenosis of left breast: Secondary | ICD-10-CM | POA: Diagnosis not present

## 2016-08-12 DIAGNOSIS — J449 Chronic obstructive pulmonary disease, unspecified: Secondary | ICD-10-CM | POA: Diagnosis not present

## 2016-08-12 DIAGNOSIS — Z801 Family history of malignant neoplasm of trachea, bronchus and lung: Secondary | ICD-10-CM | POA: Diagnosis not present

## 2016-08-12 DIAGNOSIS — M1990 Unspecified osteoarthritis, unspecified site: Secondary | ICD-10-CM | POA: Diagnosis not present

## 2016-08-12 DIAGNOSIS — D649 Anemia, unspecified: Secondary | ICD-10-CM | POA: Diagnosis not present

## 2016-08-12 DIAGNOSIS — Z803 Family history of malignant neoplasm of breast: Secondary | ICD-10-CM | POA: Diagnosis not present

## 2016-08-12 DIAGNOSIS — Z808 Family history of malignant neoplasm of other organs or systems: Secondary | ICD-10-CM | POA: Diagnosis not present

## 2016-08-12 DIAGNOSIS — F329 Major depressive disorder, single episode, unspecified: Secondary | ICD-10-CM | POA: Diagnosis not present

## 2016-08-12 DIAGNOSIS — E119 Type 2 diabetes mellitus without complications: Secondary | ICD-10-CM | POA: Diagnosis not present

## 2016-08-12 DIAGNOSIS — Z800 Family history of malignant neoplasm of digestive organs: Secondary | ICD-10-CM | POA: Diagnosis not present

## 2016-08-12 HISTORY — DX: Depression, unspecified: F32.A

## 2016-08-12 HISTORY — DX: Anemia, unspecified: D64.9

## 2016-08-12 HISTORY — DX: Personal history of other diseases of the respiratory system: Z87.09

## 2016-08-12 HISTORY — DX: Malignant (primary) neoplasm, unspecified: C80.1

## 2016-08-12 HISTORY — DX: Cardiac murmur, unspecified: R01.1

## 2016-08-12 HISTORY — DX: Unspecified osteoarthritis, unspecified site: M19.90

## 2016-08-12 HISTORY — DX: Dyspnea, unspecified: R06.00

## 2016-08-12 HISTORY — DX: Major depressive disorder, single episode, unspecified: F32.9

## 2016-08-12 NOTE — Patient Instructions (Signed)
  Your procedure is scheduled [id number] 15, 2017 (Wednesday) Report to [facility] ARRIVAL TIME 11:00 AM  Remember: Instructions that are not followed completely may result in serious medical risk, up to and including death, or upon the discretion of your surgeon and anesthesiologist your surgery may need to be rescheduled.    _x___ 1. Do not eat food or drink liquids after midnight. No gum chewing or hard candies.     __x__ 2. No Alcohol for 24 hours before or after surgery.   __x__3. No Smoking for 24 prior to surgery.   ____  4. Bring all medications with you on the day of surgery if instructed.    __x__ 5. Notify your doctor if there is any change in your medical condition     (cold, fever, infections).     Do not wear jewelry, make-up, hairpins, clips or nail polish.  Do not wear lotions, powders, or perfumes. You may wear deodorant.  Do not shave 48 hours prior to surgery. Men may shave face and neck.  Do not bring valuables to the hospital.    [facility] is not responsible for any belongings or valuables.               Contacts, dentures or bridgework may not be worn into surgery.  Leave your suitcase in the car. After surgery it may be brought to your room.  For patients admitted to the hospital, discharge time is determined by your treatment team.   Patients discharged the day of surgery will not be allowed to drive home.    Please read over the following fact sheets that you were given:   [employer] Preparing for Surgery and or MRSA Information   _x___ Take these medicines the morning of surgery with A SIP OF WATER:    1. Ramipril  2. Citalopram  3.  4.  5.  6.  ____Fleets enema or Magnesium Citrate as directed.   _x___ Use CHG Soap or sage wipes as directed on instruction sheet   _x___ Use inhalers on the day of surgery and bring to hospital day of surgery (Use Ventolin inhaler the morning of surgery and bring to hospital)  ____ Stop metformin  2 days prior to surgery    ____ Take 1/2 of usual insulin dose the night before surgery and none on the morning of surgery            _x___ Stop aspirin or coumadin, or plavix (NO ASPIRIN)  x__ Stop Anti-inflammatories such as Advil, Aleve, Ibuprofen, Motrin, Naproxen,          Naprosyn, Goodies powders or aspirin products. Ok to take Tylenol.   ____ Stop supplements until after surgery.    ____ Bring C-Pap to the hospital.

## 2016-08-15 ENCOUNTER — Encounter
Admission: RE | Admit: 2016-08-15 | Discharge: 2016-08-15 | Disposition: A | Discharge status: Home / Self Care | Payer: Managed Care, Other (non HMO) | Source: Ambulatory Visit | Attending: General Surgery | Admitting: General Surgery

## 2016-08-15 DIAGNOSIS — N6022 Fibroadenosis of left breast: Secondary | ICD-10-CM | POA: Diagnosis not present

## 2016-08-16 MED ORDER — CEFAZOLIN SODIUM-DEXTROSE 2-4 GM/100ML-% IV SOLN
2.0000 g | INTRAVENOUS | Status: AC
  Administered 2016-08-17: 2 g via INTRAVENOUS

## 2016-08-17 ENCOUNTER — Ambulatory Visit
Admission: RE | Admit: 2016-08-17 | Discharge: 2016-08-17 | Disposition: A | Discharge status: Home / Self Care | Payer: Managed Care, Other (non HMO) | Source: Ambulatory Visit | Attending: General Surgery | Admitting: General Surgery

## 2016-08-17 ENCOUNTER — Ambulatory Visit: Payer: Managed Care, Other (non HMO) | Admitting: Anesthesiology

## 2016-08-17 ENCOUNTER — Encounter
Admission: RE | Disposition: A | Discharge status: Home / Self Care | Payer: Self-pay | Source: Ambulatory Visit | Attending: General Surgery

## 2016-08-17 ENCOUNTER — Encounter: Payer: Self-pay | Admitting: General Surgery

## 2016-08-17 DIAGNOSIS — Z800 Family history of malignant neoplasm of digestive organs: Secondary | ICD-10-CM | POA: Insufficient documentation

## 2016-08-17 DIAGNOSIS — N6489 Other specified disorders of breast: Secondary | ICD-10-CM | POA: Diagnosis not present

## 2016-08-17 DIAGNOSIS — N6320 Unspecified lump in the left breast, unspecified quadrant: Secondary | ICD-10-CM

## 2016-08-17 DIAGNOSIS — I10 Essential (primary) hypertension: Secondary | ICD-10-CM | POA: Insufficient documentation

## 2016-08-17 DIAGNOSIS — E7800 Pure hypercholesterolemia, unspecified: Secondary | ICD-10-CM | POA: Insufficient documentation

## 2016-08-17 DIAGNOSIS — Z808 Family history of malignant neoplasm of other organs or systems: Secondary | ICD-10-CM | POA: Insufficient documentation

## 2016-08-17 DIAGNOSIS — Z803 Family history of malignant neoplasm of breast: Secondary | ICD-10-CM | POA: Insufficient documentation

## 2016-08-17 DIAGNOSIS — F419 Anxiety disorder, unspecified: Secondary | ICD-10-CM | POA: Insufficient documentation

## 2016-08-17 DIAGNOSIS — M1990 Unspecified osteoarthritis, unspecified site: Secondary | ICD-10-CM | POA: Insufficient documentation

## 2016-08-17 DIAGNOSIS — F17210 Nicotine dependence, cigarettes, uncomplicated: Secondary | ICD-10-CM | POA: Insufficient documentation

## 2016-08-17 DIAGNOSIS — N6022 Fibroadenosis of left breast: Secondary | ICD-10-CM | POA: Diagnosis not present

## 2016-08-17 DIAGNOSIS — F329 Major depressive disorder, single episode, unspecified: Secondary | ICD-10-CM | POA: Insufficient documentation

## 2016-08-17 DIAGNOSIS — J449 Chronic obstructive pulmonary disease, unspecified: Secondary | ICD-10-CM | POA: Insufficient documentation

## 2016-08-17 DIAGNOSIS — D649 Anemia, unspecified: Secondary | ICD-10-CM | POA: Insufficient documentation

## 2016-08-17 DIAGNOSIS — Z8249 Family history of ischemic heart disease and other diseases of the circulatory system: Secondary | ICD-10-CM | POA: Insufficient documentation

## 2016-08-17 DIAGNOSIS — E119 Type 2 diabetes mellitus without complications: Secondary | ICD-10-CM | POA: Insufficient documentation

## 2016-08-17 DIAGNOSIS — Z801 Family history of malignant neoplasm of trachea, bronchus and lung: Secondary | ICD-10-CM | POA: Insufficient documentation

## 2016-08-17 HISTORY — PX: EXCISION / BIOPSY BREAST / NIPPLE / DUCT: SUR469

## 2016-08-17 HISTORY — PX: BREAST LUMPECTOMY WITH NEEDLE LOCALIZATION: SHX5759

## 2016-08-17 HISTORY — PX: BREAST LUMPECTOMY: SHX2

## 2016-08-17 LAB — GLUCOSE, CAPILLARY: GLUCOSE-CAPILLARY: 68 mg/dL (ref 65–99)

## 2016-08-17 SURGERY — BREAST LUMPECTOMY WITH NEEDLE LOCALIZATION
Anesthesia: General | Laterality: Left | Wound class: Clean

## 2016-08-17 MED ORDER — ACETAMINOPHEN 10 MG/ML IV SOLN
INTRAVENOUS | Status: DC | PRN
  Administered 2016-08-17: 1000 mg via INTRAVENOUS

## 2016-08-17 MED ORDER — PROPOFOL 10 MG/ML IV BOLUS
INTRAVENOUS | Status: DC | PRN
  Administered 2016-08-17: 200 mg via INTRAVENOUS

## 2016-08-17 MED ORDER — OXYCODONE HCL 5 MG/5ML PO SOLN: 5.0000 mg | Freq: Once | ORAL | Status: AC | PRN

## 2016-08-17 MED ORDER — LIDOCAINE HCL (PF) 1 % IJ SOLN
INTRAMUSCULAR | Status: AC
Start: 2016-08-17 — End: 2016-08-17
  Filled 2016-08-17: qty 30

## 2016-08-17 MED ORDER — PROMETHAZINE HCL 25 MG/ML IJ SOLN: 6.2500 mg | INTRAMUSCULAR | Status: DC | PRN

## 2016-08-17 MED ORDER — CHLORHEXIDINE GLUCONATE CLOTH 2 % EX PADS
6.0000 | MEDICATED_PAD | Freq: Once | CUTANEOUS | Status: DC
Start: 2016-08-17 — End: 2016-08-17

## 2016-08-17 MED ORDER — BUPIVACAINE HCL (PF) 0.5 % IJ SOLN
INTRAMUSCULAR | Status: DC | PRN
  Administered 2016-08-17: 20 mL

## 2016-08-17 MED ORDER — TRAMADOL HCL 50 MG PO TABS: 50.0000 mg | ORAL_TABLET | Freq: Four times a day (QID) | ORAL | 0 refills | Status: DC | PRN

## 2016-08-17 MED ORDER — FENTANYL CITRATE (PF) 100 MCG/2ML IJ SOLN
25.0000 ug | INTRAMUSCULAR | Status: DC | PRN
  Administered 2016-08-17 (×2): 25 ug via INTRAVENOUS
  Administered 2016-08-17: 50 ug via INTRAVENOUS

## 2016-08-17 MED ORDER — ACETAMINOPHEN 10 MG/ML IV SOLN
INTRAVENOUS | Status: AC
  Filled 2016-08-17: qty 100

## 2016-08-17 MED ORDER — ONDANSETRON HCL 4 MG/2ML IJ SOLN
INTRAMUSCULAR | Status: DC | PRN
  Administered 2016-08-17: 4 mg via INTRAVENOUS

## 2016-08-17 MED ORDER — CEFAZOLIN SODIUM-DEXTROSE 2-4 GM/100ML-% IV SOLN
INTRAVENOUS | Status: AC
  Administered 2016-08-17: 2 g via INTRAVENOUS
  Filled 2016-08-17: qty 100

## 2016-08-17 MED ORDER — EPHEDRINE SULFATE 50 MG/ML IJ SOLN
INTRAMUSCULAR | Status: DC | PRN
  Administered 2016-08-17 (×2): 5 mg via INTRAVENOUS

## 2016-08-17 MED ORDER — SODIUM CHLORIDE 0.9 % IV SOLN
INTRAVENOUS | Status: DC
Start: 2016-08-17 — End: 2016-08-17

## 2016-08-17 MED ORDER — BUPIVACAINE HCL (PF) 0.5 % IJ SOLN
INTRAMUSCULAR | Status: AC
  Filled 2016-08-17: qty 30

## 2016-08-17 MED ORDER — FENTANYL CITRATE (PF) 100 MCG/2ML IJ SOLN
INTRAMUSCULAR | Status: AC
  Filled 2016-08-17: qty 2

## 2016-08-17 MED ORDER — FAMOTIDINE 20 MG PO TABS
ORAL_TABLET | ORAL | Status: AC
  Administered 2016-08-17: 20 mg via ORAL
  Filled 2016-08-17: qty 1

## 2016-08-17 MED ORDER — MEPERIDINE HCL 25 MG/ML IJ SOLN: 6.2500 mg | INTRAMUSCULAR | Status: DC | PRN

## 2016-08-17 MED ORDER — DEXAMETHASONE SODIUM PHOSPHATE 10 MG/ML IJ SOLN
INTRAMUSCULAR | Status: DC | PRN
  Administered 2016-08-17: 5 mg via INTRAVENOUS

## 2016-08-17 MED ORDER — LACTATED RINGERS IV SOLN
INTRAVENOUS | Status: DC
  Administered 2016-08-17: 13:00:00 via INTRAVENOUS

## 2016-08-17 MED ORDER — FENTANYL CITRATE (PF) 100 MCG/2ML IJ SOLN
INTRAMUSCULAR | Status: DC | PRN
  Administered 2016-08-17: 50 ug via INTRAVENOUS

## 2016-08-17 MED ORDER — FAMOTIDINE 20 MG PO TABS
20.0000 mg | ORAL_TABLET | Freq: Once | ORAL | Status: AC
  Administered 2016-08-17: 20 mg via ORAL

## 2016-08-17 MED ORDER — OXYCODONE HCL 5 MG PO TABS
ORAL_TABLET | ORAL | Status: AC
  Filled 2016-08-17: qty 1

## 2016-08-17 MED ORDER — OXYCODONE HCL 5 MG PO TABS
5.0000 mg | ORAL_TABLET | Freq: Once | ORAL | Status: AC | PRN
  Administered 2016-08-17: 5 mg via ORAL

## 2016-08-17 MED ORDER — MIDAZOLAM HCL 2 MG/2ML IJ SOLN
INTRAMUSCULAR | Status: DC | PRN
  Administered 2016-08-17: 1 mg via INTRAVENOUS

## 2016-08-17 MED ORDER — LIDOCAINE HCL (CARDIAC) 20 MG/ML IV SOLN
INTRAVENOUS | Status: DC | PRN
  Administered 2016-08-17: 30 mg via INTRAVENOUS

## 2016-08-17 SURGICAL SUPPLY — 36 items
BLADE SURG 15 STRL SS SAFETY (BLADE) ×3 IMPLANT
CANISTER SUCT 1200ML W/VALVE (MISCELLANEOUS) ×3 IMPLANT
CHLORAPREP W/TINT 26ML (MISCELLANEOUS) ×3 IMPLANT
CNTNR SPEC 2.5X3XGRAD LEK (MISCELLANEOUS)
CONT SPEC 4OZ STER OR WHT (MISCELLANEOUS)
CONTAINER SPEC 2.5X3XGRAD LEK (MISCELLANEOUS) IMPLANT
COVER PROBE FLX POLY STRL (MISCELLANEOUS) ×3 IMPLANT
DERMABOND ADVANCED (GAUZE/BANDAGES/DRESSINGS) ×2
DERMABOND ADVANCED .7 DNX12 (GAUZE/BANDAGES/DRESSINGS) ×1 IMPLANT
DEVICE DUBIN SPECIMEN MAMMOGRA (MISCELLANEOUS) ×3 IMPLANT
DEVICE LOCALIZATION ULTRAWIRE (WIRE) IMPLANT
DEVICE ULTRAWIRE LOCAL 19X9 (WIRE) IMPLANT
DRAPE LAPAROTOMY 100X77 ABD (DRAPES) ×3 IMPLANT
ELECT REM PT RETURN 9FT ADLT (ELECTROSURGICAL) ×3
ELECTRODE REM PT RTRN 9FT ADLT (ELECTROSURGICAL) ×1 IMPLANT
GLOVE BIO SURGEON STRL SZ7 (GLOVE) ×18 IMPLANT
GOWN STRL REUS W/ TWL LRG LVL3 (GOWN DISPOSABLE) ×3 IMPLANT
GOWN STRL REUS W/TWL LRG LVL3 (GOWN DISPOSABLE) ×6
HARMONIC SCALPEL FOCUS (MISCELLANEOUS) IMPLANT
KIT RM TURNOVER STRD PROC AR (KITS) ×3 IMPLANT
LABEL OR SOLS (LABEL) ×3 IMPLANT
MARGIN MAP 10MM (MISCELLANEOUS) ×3 IMPLANT
NDL SAFETY 22GX1.5 (NEEDLE) ×3 IMPLANT
NEEDLE HYPO 25X1 1.5 SAFETY (NEEDLE) ×3 IMPLANT
PACK BASIN MINOR ARMC (MISCELLANEOUS) ×3 IMPLANT
SUT ETH BLK MONO 3 0 FS 1 12/B (SUTURE) ×3 IMPLANT
SUT VIC AB 2-0 CT1 (SUTURE) ×3 IMPLANT
SUT VIC AB 3-0 54X BRD REEL (SUTURE) ×1 IMPLANT
SUT VIC AB 3-0 BRD 54 (SUTURE) ×2
SUT VIC AB 3-0 SH 27 (SUTURE) ×2
SUT VIC AB 3-0 SH 27X BRD (SUTURE) ×1 IMPLANT
SUT VIC AB 4-0 FS2 27 (SUTURE) ×3 IMPLANT
SYR CONTROL 10ML (SYRINGE) ×3 IMPLANT
ULTRAWIRE LOCAL DEVICE 19X9 (WIRE)
ULTRAWIRE LOCALIZATION DEVICE (WIRE)
WATER STERILE IRR 1000ML POUR (IV SOLUTION) ×3 IMPLANT

## 2016-08-17 NOTE — OR Nursing (Signed)
Patient's chart is marked as her having diabetes but patient denies being diabetic and has never been treated for diabetes.

## 2016-08-17 NOTE — Transfer of Care (Signed)
Immediate Anesthesia Transfer of Care Note  Patient: Christine Sparks  Procedure(s) Performed: Procedure(s): BREAST LUMPECTOMY WITH NEEDLE LOCALIZATION (Left)  Patient Location: PACU  Anesthesia Type:General  Level of Consciousness: awake, alert  and oriented  Airway & Oxygen Therapy: Patient Spontanous Breathing and Patient connected to face mask oxygen  Post-op Assessment: Report given to RN and Post -op Vital signs reviewed and stable  Post vital signs: Reviewed and stable  Last Vitals:  Vitals:   08/17/16 1221 08/17/16 1408  BP: 111/68 (!) (P) 119/97  Pulse: 68 (P) 76  Resp: 14 (P) 11  Temp: 36.5 C (P) 36.3 C    Last Pain:  Vitals:   08/17/16 1221  TempSrc: Tympanic         Complications: No apparent anesthesia complications

## 2016-08-17 NOTE — Discharge Instructions (Signed)

## 2016-08-17 NOTE — Anesthesia Procedure Notes (Signed)
Procedure Name: LMA Insertion Date/Time: 08/17/2016 1:12 PM Performed by: Kennon Holter Pre-anesthesia Checklist: Patient identified, Patient being monitored, Timeout performed, Emergency Drugs available and Suction available Patient Re-evaluated:Patient Re-evaluated prior to inductionOxygen Delivery Method: Circle system utilized Preoxygenation: Pre-oxygenation with 100% oxygen Intubation Type: IV induction Ventilation: Mask ventilation without difficulty LMA: LMA inserted LMA Size: 4.0 Tube type: Oral Number of attempts: 1 Placement Confirmation: positive ETCO2 and breath sounds checked- equal and bilateral Tube secured with: Tape Dental Injury: Teeth and Oropharynx as per pre-operative assessment

## 2016-08-17 NOTE — H&P (View-Only) (Signed)
Patient ID: Christine Sparks, female   DOB: [birth year], 55 y.o.   MRN: [id number]  Chief Complaint  Patient presents with  . Other    mammogram     HPI Christine Sparks is a 55 y.o. female who presents for a breast evaluation. The most recent mammogram was done on 06/22/2016 and added views on 07/13/2016. Left breast biopsy performed on 07/26/2016. Patient does perform regular self breast checks and gets regular mammograms.  No breast symptoms. Denies any abdominal complaints.     I have reviewed the history of present illness with the patient.  HPI  Past Medical History:  Diagnosis Date  . Anxiety   . Bronchitis, chronic obstructive ([place])   . Diabetes mellitus jan 2012   a1c 5.7, fasting glu 155  . High cholesterol   . Hypertension   . Menopausal syndrome   . Tobacco abuse     Past Surgical History:  Procedure Laterality Date  . CERVICAL CONE BIOPSY    . DILATION AND CURETTAGE OF UTERUS    . LYMPH NODE BIOPSY    . TONSILLECTOMY      Family History  Problem Relation Age of Onset  . Cancer Mother     melanoma  . Cancer Father     Lung Cancer  . Early death Brother   . Heart disease Brother   . Breast cancer Paternal Aunt 53  . Colon cancer Other   . Ovarian cancer Neg Hx   . Diabetes Neg Hx     Social History Social History  Substance Use Topics  . Smoking status: Current Every Day Smoker    Packs/day: 0.50    Types: Cigarettes  . Smokeless tobacco: Never Used  . Alcohol use Yes     Comment: occasionally    No Known Allergies  Current Outpatient Prescriptions  Medication Sig Dispense Refill  . citalopram (CELEXA) 40 MG tablet TAKE 0.5 TABLETS (20 MG TOTAL) BY MOUTH DAILY. 30 tablet 2  . estradiol (ESTRACE) 0.5 MG tablet Take 1 tablet (0.5 mg total) by mouth daily. 30 tablet 6  . fenofibrate (TRICOR) 145 MG tablet Take 145 mg by mouth daily.    Marland Kitchen LORazepam (ATIVAN) 0.5 MG tablet TAKE 1 TABLET BY MOUTH TWICE A DAY AS NEEDED 60 tablet 3  . progesterone  (PROMETRIUM) 100 MG capsule Take 1 capsule (100 mg total) by mouth daily. 30 capsule 6  . ramipril (ALTACE) 10 MG capsule TAKE 1 CAPSULE (10 MG TOTAL) BY MOUTH DAILY. 90 capsule 2  . VENTOLIN HFA 108 (90 BASE) MCG/ACT inhaler INHALE 2 PUFFS BY MOUTH EVERY 6 HOURS ASNEEDED 18 g 0   No current facility-administered medications for this visit.     Review of Systems Review of Systems  Constitutional: Negative.   Respiratory: Negative.   Cardiovascular: Negative.   Gastrointestinal: Negative.     Blood pressure 124/72, pulse 74, resp. rate 12, height 5\' 6"  (1.676 m), weight 145 lb (65.8 kg).  Physical Exam Physical Exam  Constitutional: She is oriented to person, place, and time. She appears well-developed and well-nourished.  Eyes: Conjunctivae are normal. No scleral icterus.  Neck: Neck supple.  Cardiovascular: Normal rate, regular rhythm and normal heart sounds.   Pulmonary/Chest: Effort normal. Right breast exhibits no inverted nipple, no mass, no nipple discharge, no skin change and no tenderness. Left breast exhibits no inverted nipple, no mass, no nipple discharge, no skin change and no tenderness.    Abdominal: Soft. Bowel sounds are normal.  There is no hepatomegaly. There is no tenderness.  Lymphadenopathy:    She has no cervical adenopathy.    She has no axillary adenopathy.  Neurological: She is alert and oriented to person, place, and time.  Skin: Skin is warm and dry.    Data Reviewed Mammogram results from 07/13/16 Breast biopsy results from 07/26/16  Assessment    Complex sclerosing lesion of left breast. Petra Kuba of this finding explained fully to pt.     Plan    Discussed risk of associated cancer with this type of lesion. Current recommendations state to perform excision, however, close follow-up is also an option.   Patient wishes to proceed with excision.    This information has been scribed by Gaspar Cola CMA.      SANKAR,SEEPLAPUTHUR  G 08/04/2016, 12:17 PM

## 2016-08-17 NOTE — Anesthesia Preprocedure Evaluation (Signed)
Anesthesia Evaluation  Patient identified by MRN, date of birth, ID band Patient awake    Reviewed: Allergy & Precautions, NPO status , Patient's Chart, lab work & pertinent test results  History of Anesthesia Complications Negative for: history of anesthetic complications  Airway Mallampati: II  TM Distance: >3 FB Neck ROM: Full    Dental  (+) Poor Dentition   Pulmonary neg sleep apnea, COPD,  COPD inhaler, Current Smoker,    breath sounds clear to auscultation- rhonchi (-) wheezing      Cardiovascular Exercise Tolerance: Good hypertension, Pt. on medications (-) CAD and (-) Past MI  Rhythm:Regular Rate:Normal - Systolic murmurs and - Diastolic murmurs    Neuro/Psych Anxiety Depression negative neurological ROS     GI/Hepatic negative GI ROS, Neg liver ROS,   Endo/Other  neg diabetes  Renal/GU negative Renal ROS     Musculoskeletal  (+) Arthritis ,   Abdominal (+) - obese,   Peds  Hematology  (+) anemia ,   Anesthesia Other Findings Past Medical History: No date: Anemia No date: Anxiety No date: Arthritis No date: Bronchitis, chronic obstructive (HCC) No date: Cancer [facility])     Comment: pre-cancerous cells No date: Depression jan 2012: Diabetes mellitus     Comment: a1c 5.7, fasting glu 155 No date: Dyspnea     Comment: with exertion No date: H/O bronchitis No date: Heart murmur No date: High cholesterol No date: Hypertension No date: Menopausal syndrome No date: Tobacco abuse   Reproductive/Obstetrics                             Anesthesia Physical Anesthesia Plan  ASA: II  Anesthesia Plan: General   Post-op Pain Management:    Induction: Intravenous  Airway Management Planned: LMA  Additional Equipment:   Intra-op Plan:   Post-operative Plan:   Informed Consent: I have reviewed the patients History and Physical, chart, labs and discussed the procedure  including the risks, benefits and alternatives for the proposed anesthesia with the patient or authorized representative who has indicated his/her understanding and acceptance.   Dental advisory given  Plan Discussed with: CRNA and Anesthesiologist  Anesthesia Plan Comments:         Anesthesia Quick Evaluation

## 2016-08-17 NOTE — Interval H&P Note (Signed)
History and Physical Interval Note:  08/17/2016 12:35 PM  Christine Sparks  has presented today for surgery, with the diagnosis of left breast complex lesion.  The various methods of treatment have been discussed with the patient and family. After consideration of risks, benefits and other options for treatment, the patient has consented to  Procedure(s): BREAST LUMPECTOMY WITH NEEDLE LOCALIZATION (Left) as a surgical intervention .  The patient's history has been reviewed, patient examined, no change in status, stable for surgery.  I have reviewed the patient's chart and labs.  Questions were answered to the patient's satisfaction.     SANKAR,SEEPLAPUTHUR G

## 2016-08-17 NOTE — Anesthesia Postprocedure Evaluation (Signed)
Anesthesia Post Note  Patient: Christine Sparks  Procedure(s) Performed: Procedure(s) (LRB): BREAST LUMPECTOMY WITH NEEDLE LOCALIZATION (Left)  Patient location during evaluation: PACU Anesthesia Type: General Level of consciousness: awake and alert and oriented Pain management: pain level controlled Vital Signs Assessment: post-procedure vital signs reviewed and stable Respiratory status: spontaneous breathing, nonlabored ventilation and respiratory function stable Cardiovascular status: blood pressure returned to baseline and stable Postop Assessment: no signs of nausea or vomiting Anesthetic complications: no    Last Vitals:  Vitals:   08/17/16 1408 08/17/16 1424  BP: (!) 119/97 107/77  Pulse: 76 67  Resp: 11 20  Temp: 36.3 C     Last Pain:  Vitals:   08/17/16 1424  TempSrc:   PainSc: 4                  Mackson Botz

## 2016-08-17 NOTE — Progress Notes (Signed)
Pt states pain is better only hurts when she moves   No drainage noted

## 2016-08-17 NOTE — Op Note (Signed)
Preop diagnosis: Complex sclerosing lesion left breast  Post op diagnosis: Same  Operation: Left breast lumpectomy with wire localization  Surgeon: Mckinley Jewel  Assistant:     Anesthesia: Gen.  Complications: None  EBL: Minimal  Drains: None  Description: Patient underwent localization of the biopsy site in the inner aspect of the left breast. She was subsequently brought the operating room and put to sleep with an LMA and timeout performed. Left breast were prepped and draped as sterile field. The wire was placed with the notation that the actual biopsy site was biceps sent centimeter above the wire was noted to discuss displacement of the previously placed clip. This was noted. A curvilinear incision was mapped out towards the medial end of the left breast but lateral to the wire entrance. 20 mL of 0.5% Marcaine was instilled for analgesia. Skin incision was made in the skin and subcutaneous tissue were elevated on both sides. The subcutaneous space the wire was freed from the skin. Using this as a guide and based on the images sent with the localization lumpectomy was completed to include the upper all the tissue above the wire. An biopsy cavity was palpable within the excised tissue. Pathology is reviewed the specimen mammogram subsequently and felt that the biopsy side was adequately removed. The wound was irrigated and hemostasis obtained with cautery. The deeper tissues were closed with interrupted 2-0 Vicryl. Subcutaneous tissue was closed with running 3-0 Vicryl. Skin was closed with a running subcuticular 4-0 Vicryl stitch covered with Dermabond. Procedure was well-tolerated and she was subsequently extubated and returned recovery room stable condition.

## 2016-08-18 ENCOUNTER — Encounter: Payer: Managed Care, Other (non HMO) | Admitting: Obstetrics and Gynecology

## 2016-08-19 LAB — SURGICAL PATHOLOGY

## 2016-08-23 ENCOUNTER — Telehealth: Payer: Self-pay | Admitting: *Deleted

## 2016-08-23 NOTE — Telephone Encounter (Signed)
Patient called and wanted to know results from surgery on 08/17/16. Thanks.

## 2016-08-30 ENCOUNTER — Ambulatory Visit (INDEPENDENT_AMBULATORY_CARE_PROVIDER_SITE_OTHER): Payer: Managed Care, Other (non HMO) | Admitting: General Surgery

## 2016-08-30 ENCOUNTER — Encounter: Payer: Self-pay | Admitting: General Surgery

## 2016-08-30 VITALS — BP 126/60 | HR 78 | Resp 14 | Ht 66.0 in | Wt 146.0 lb

## 2016-08-30 DIAGNOSIS — N6092 Unspecified benign mammary dysplasia of left breast: Secondary | ICD-10-CM

## 2016-08-30 DIAGNOSIS — N6320 Unspecified lump in the left breast, unspecified quadrant: Secondary | ICD-10-CM

## 2016-08-30 NOTE — Patient Instructions (Signed)
Return in 5 months with left diagnostic mammogram

## 2016-08-30 NOTE — Progress Notes (Signed)
Patient ID: Christine Sparks, female   DOB: 1960/10/17, 55 y.o.   MRN: [id number]  Chief Complaint  Patient presents with  . Follow-up    left breast lumpectomy    HPI Christine Sparks is a 55 y.o. female here today for her follow up left breast lumpectomy done on 08/17/16. Patient states area was sore but currently has no other complaints.  I have reviewed the history of present illness with the patient.  HPI  Past Medical History:  Diagnosis Date  . Anemia   . Anxiety   . Arthritis   . Bronchitis, chronic obstructive ([place])   . Cancer (HCC)    pre-cancerous cells  . Depression   . Diabetes mellitus jan 2012   a1c 5.7, fasting glu 155  . Dyspnea    with exertion  . H/O bronchitis   . Heart murmur   . High cholesterol   . Hypertension   . Menopausal syndrome   . Tobacco abuse     Past Surgical History:  Procedure Laterality Date  . BREAST LUMPECTOMY WITH NEEDLE LOCALIZATION Left 08/17/2016   Procedure: BREAST LUMPECTOMY WITH NEEDLE LOCALIZATION;  Surgeon: Christene Lye, MD;  Location: [facility] ORS;  Service: General;  Laterality: Left;  . BREAST SURGERY Left   . CERVICAL CONE BIOPSY    . DILATION AND CURETTAGE OF UTERUS    . LIPOMA EXCISION Right    benign tumor right leg  . LYMPH NODE BIOPSY    . TONSILLECTOMY      Family History  Problem Relation Age of Onset  . Cancer Mother     melanoma  . Cancer Father     Lung Cancer  . Early death Brother   . Heart disease Brother   . Breast cancer Paternal Aunt 47  . Colon cancer Other   . Ovarian cancer Neg Hx   . Diabetes Neg Hx     Social History Social History  Substance Use Topics  . Smoking status: Current Every Day Smoker    Packs/day: 0.50    Types: Cigarettes  . Smokeless tobacco: Never Used  . Alcohol use Yes     Comment: occasionally    No Known Allergies  Current Outpatient Prescriptions  Medication Sig Dispense Refill  . citalopram (CELEXA) 40 MG tablet TAKE 0.5 TABLETS (20 MG TOTAL) BY  MOUTH DAILY. (Patient taking differently: Take 40mg s every morning) 30 tablet 2  . estradiol (ESTRACE) 0.5 MG tablet Take 1 tablet (0.5 mg total) by mouth daily. 30 tablet 6  . fenofibrate (TRICOR) 145 MG tablet Take 145 mg by mouth daily.    Marland Kitchen ibuprofen (ADVIL,MOTRIN) 200 MG tablet Take 200 mg by mouth every 6 (six) hours as needed for headache or moderate pain.    Marland Kitchen LORazepam (ATIVAN) 0.5 MG tablet TAKE 1 TABLET BY MOUTH TWICE A DAY AS NEEDED (Patient taking differently: Take 0.5mg s every morning) 60 tablet 3  . progesterone (PROMETRIUM) 100 MG capsule Take 1 capsule (100 mg total) by mouth daily. 30 capsule 6  . ramipril (ALTACE) 10 MG capsule TAKE 1 CAPSULE (10 MG TOTAL) BY MOUTH DAILY. 90 capsule 2  . traMADol (ULTRAM) 50 MG tablet Take 1 tablet (50 mg total) by mouth every 6 (six) hours as needed. 20 tablet 0  . VENTOLIN HFA 108 (90 BASE) MCG/ACT inhaler INHALE 2 PUFFS BY MOUTH EVERY 6 HOURS ASNEEDED 18 g 0   No current facility-administered medications for this visit.     Review of Systems  Review of Systems  Constitutional: Negative.   Respiratory: Negative.   Cardiovascular: Negative.     Blood pressure 126/60, pulse 78, resp. rate 14, height 5\' 6"  (1.676 m), weight 146 lb (66.2 kg).  Physical Exam Physical Exam  Constitutional: She is oriented to person, place, and time. She appears well-developed and well-nourished.  Pulmonary/Chest:    Neurological: She is alert and oriented to person, place, and time.  Skin: Skin is warm and dry.    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 Anti-hormonal therapy risks and benefits discussed. Given history of severe hot flashes will hold on instituting antihormonal therapy for now Advised to stop current hormonal replacement. Dr  Enzo Bi was informed of this and he agrees     This information has been scribed by Gaspar Cola CMA.   Jesusmanuel Erbes G 08/30/2016, 9:29 AM

## 2016-11-21 ENCOUNTER — Other Ambulatory Visit: Payer: Self-pay

## 2016-11-21 DIAGNOSIS — N6092 Unspecified benign mammary dysplasia of left breast: Secondary | ICD-10-CM

## 2016-11-21 DIAGNOSIS — Z299 Encounter for prophylactic measures, unspecified: Secondary | ICD-10-CM

## 2016-11-21 NOTE — Progress Notes (Signed)
Patient came in to have blood drawn for testing per Evern Bio, NP-C orders.

## 2016-11-22 LAB — CMP12+LP+TP+TSH+6AC+CBC/D/PLT
ALK PHOS: 65 IU/L (ref 39–117)
ALT: 14 IU/L (ref 0–32)
AST: 17 IU/L (ref 0–40)
Albumin/Globulin Ratio: 2 (ref 1.2–2.2)
Albumin: 4.3 g/dL (ref 3.5–5.5)
BASOS: 1 %
BILIRUBIN TOTAL: 0.3 mg/dL (ref 0.0–1.2)
BUN / CREAT RATIO: 12 (ref 9–23)
BUN: 10 mg/dL (ref 6–24)
Basophils Absolute: 0.1 10*3/uL (ref 0.0–0.2)
CHLORIDE: 101 mmol/L (ref 96–106)
CHOLESTEROL TOTAL: 197 mg/dL (ref 100–199)
Calcium: 9.7 mg/dL (ref 8.7–10.2)
Chol/HDL Ratio: 3 ratio units (ref 0.0–4.4)
Creatinine, Ser: 0.81 mg/dL (ref 0.57–1.00)
EOS (ABSOLUTE): 0.3 10*3/uL (ref 0.0–0.4)
EOS: 4 %
FREE THYROXINE INDEX: 1.7 (ref 1.2–4.9)
GFR calc non Af Amer: 81 mL/min/{1.73_m2} (ref >59)
GFR, EST AFRICAN AMERICAN: 93 mL/min/{1.73_m2} (ref >59)
GGT: 18 IU/L (ref 0–60)
GLUCOSE: 79 mg/dL (ref 65–99)
Globulin, Total: 2.1 g/dL (ref 1.5–4.5)
HDL: 65 mg/dL (ref >39)
HEMATOCRIT: 41.6 % (ref 34.0–46.6)
HEMOGLOBIN: 14 g/dL (ref 11.1–15.9)
IMMATURE GRANS (ABS): 0 10*3/uL (ref 0.0–0.1)
Immature Granulocytes: 0 %
Iron: 127 ug/dL (ref 27–159)
LDH: 175 IU/L (ref 119–226)
LDL CALC: 104 mg/dL — AB (ref 0–99)
LYMPHS ABS: 1.7 10*3/uL (ref 0.7–3.1)
Lymphs: 27 %
MCH: 31.5 pg (ref 26.6–33.0)
MCHC: 33.7 g/dL (ref 31.5–35.7)
MCV: 94 fL (ref 79–97)
MONOCYTES: 10 %
Monocytes Absolute: 0.6 10*3/uL (ref 0.1–0.9)
Neutrophils Absolute: 3.6 10*3/uL (ref 1.4–7.0)
Neutrophils: 58 %
Phosphorus: 4.4 mg/dL (ref 2.5–4.5)
Platelets: 448 10*3/uL — ABNORMAL HIGH (ref 150–379)
Potassium: 5.3 mmol/L — ABNORMAL HIGH (ref 3.5–5.2)
RBC: 4.44 x10E6/uL (ref 3.77–5.28)
RDW: 13.5 % (ref 12.3–15.4)
SODIUM: 143 mmol/L (ref 134–144)
T3 Uptake Ratio: 28 % (ref 24–39)
T4, Total: 6 ug/dL (ref 4.5–12.0)
TSH: 1.97 u[IU]/mL (ref 0.450–4.500)
Total Protein: 6.4 g/dL (ref 6.0–8.5)
Triglycerides: 140 mg/dL (ref 0–149)
URIC ACID: 3.9 mg/dL (ref 2.5–7.1)
VLDL CHOLESTEROL CAL: 28 mg/dL (ref 5–40)
WBC: 6.3 10*3/uL (ref 3.4–10.8)

## 2016-11-22 LAB — VITAMIN D 25 HYDROXY (VIT D DEFICIENCY, FRACTURES): VIT D 25 HYDROXY: 13.7 ng/mL — AB (ref 30.0–100.0)

## 2017-01-30 ENCOUNTER — Ambulatory Visit
Admission: RE | Admit: 2017-01-30 | Discharge: 2017-01-30 | Disposition: A | Discharge status: Home / Self Care | Payer: Managed Care, Other (non HMO) | Source: Ambulatory Visit | Attending: General Surgery | Admitting: General Surgery

## 2017-01-30 DIAGNOSIS — N6092 Unspecified benign mammary dysplasia of left breast: Secondary | ICD-10-CM

## 2017-01-31 ENCOUNTER — Ambulatory Visit: Payer: Self-pay | Admitting: Physician Assistant

## 2017-01-31 VITALS — BP 140/60 | HR 80 | Ht 66.0 in | Wt 147.0 lb

## 2017-01-31 DIAGNOSIS — Z0189 Encounter for other specified special examinations: Principal | ICD-10-CM

## 2017-01-31 DIAGNOSIS — Z008 Encounter for other general examination: Secondary | ICD-10-CM

## 2017-01-31 NOTE — Progress Notes (Signed)
Patient came in to have her Biometric screening done.  Patient expressed that she already had her physical done with her primary physician.

## 2017-02-07 ENCOUNTER — Ambulatory Visit: Payer: Managed Care, Other (non HMO) | Admitting: General Surgery

## 2017-02-28 ENCOUNTER — Encounter: Payer: Self-pay | Admitting: General Surgery

## 2017-02-28 ENCOUNTER — Ambulatory Visit (INDEPENDENT_AMBULATORY_CARE_PROVIDER_SITE_OTHER): Payer: Managed Care, Other (non HMO) | Admitting: General Surgery

## 2017-02-28 VITALS — BP 140/78 | HR 82 | Resp 14 | Ht 67.0 in | Wt 151.0 lb

## 2017-02-28 DIAGNOSIS — N6092 Unspecified benign mammary dysplasia of left breast: Secondary | ICD-10-CM

## 2017-02-28 MED ORDER — TAMOXIFEN CITRATE 20 MG PO TABS: 20.0000 mg | ORAL_TABLET | Freq: Every day | ORAL | 3 refills | Status: DC

## 2017-02-28 NOTE — Patient Instructions (Signed)
Patient to return in four months bilateral screening mammogram.

## 2017-02-28 NOTE — Progress Notes (Signed)
Patient ID: Christine Sparks, female   DOB: 12/30/1960, 56 y.o.   MRN: [id number]  Chief Complaint  Patient presents with  . Follow-up    HPI Christine Sparks is a 56 y.o. female who presents for a breast evaluation. The most recent mammogram was done on 01/30/2017.  Patient does perform regular self breast checks and gets regular mammograms done.   Patient reports doing well, reports stopping hormones before her surgery, complains of continued hot flashes.  HPI  Past Medical History:  Diagnosis Date  . Anemia   . Anxiety   . Arthritis   . Bronchitis, chronic obstructive ([place])   . Cancer (HCC)    pre-cancerous cells  . Depression   . Diabetes mellitus jan 2012   a1c 5.7, fasting glu 155  . Dyspnea    with exertion  . H/O bronchitis   . Heart murmur   . High cholesterol   . Hypertension   . Menopausal syndrome   . Tobacco abuse     Past Surgical History:  Procedure Laterality Date  . BREAST BIOPSY Left 07/26/2016   neg/ ADH/stereo  . BREAST LUMPECTOMY Left 08/17/2016   ADH  . BREAST LUMPECTOMY WITH NEEDLE LOCALIZATION Left 08/17/2016   Procedure: BREAST LUMPECTOMY WITH NEEDLE LOCALIZATION;  Surgeon: Christene Lye, MD;  Location: [facility] ORS;  Service: General;  Laterality: Left;  . CERVICAL CONE BIOPSY    . DILATION AND CURETTAGE OF UTERUS    . LIPOMA EXCISION Right    benign tumor right leg  . LYMPH NODE BIOPSY    . TONSILLECTOMY      Family History  Problem Relation Age of Onset  . Cancer Mother        melanoma  . Cancer Father        Lung Cancer  . Early death Brother   . Heart disease Brother   . Breast cancer Paternal Aunt 55  . Colon cancer Other   . Ovarian cancer Neg Hx   . Diabetes Neg Hx     Social History Social History  Substance Use Topics  . Smoking status: Current Every Day Smoker    Packs/day: 0.50    Types: Cigarettes  . Smokeless tobacco: Never Used  . Alcohol use Yes     Comment: occasionally    No Known Allergies  Current  Outpatient Prescriptions  Medication Sig Dispense Refill  . citalopram (CELEXA) 40 MG tablet TAKE 0.5 TABLETS (20 MG TOTAL) BY MOUTH DAILY. (Patient taking differently: Take 40mg s every morning) 30 tablet 2  . estradiol (ESTRACE) 0.5 MG tablet Take 1 tablet (0.5 mg total) by mouth daily. 30 tablet 6  . fenofibrate (TRICOR) 145 MG tablet Take 145 mg by mouth daily.    Marland Kitchen ibuprofen (ADVIL,MOTRIN) 200 MG tablet Take 200 mg by mouth every 6 (six) hours as needed for headache or moderate pain.    Marland Kitchen LORazepam (ATIVAN) 0.5 MG tablet TAKE 1 TABLET BY MOUTH TWICE A DAY AS NEEDED (Patient taking differently: Take 0.5mg s every morning) 60 tablet 3  . progesterone (PROMETRIUM) 100 MG capsule Take 1 capsule (100 mg total) by mouth daily. 30 capsule 6  . ramipril (ALTACE) 10 MG capsule TAKE 1 CAPSULE (10 MG TOTAL) BY MOUTH DAILY. 90 capsule 2  . traMADol (ULTRAM) 50 MG tablet Take 1 tablet (50 mg total) by mouth every 6 (six) hours as needed. 20 tablet 0  . VENTOLIN HFA 108 (90 BASE) MCG/ACT inhaler INHALE 2 PUFFS BY MOUTH EVERY  6 HOURS ASNEEDED 18 g 0  . tamoxifen (NOLVADEX) 20 MG tablet Take 1 tablet (20 mg total) by mouth daily. 90 tablet 3   No current facility-administered medications for this visit.     Review of Systems Review of Systems  Constitutional: Negative.   Respiratory: Negative.   Cardiovascular: Negative.     Blood pressure 140/78, pulse 82, resp. rate 14, height 5\' 7"  (1.702 m), weight 151 lb (68.5 kg).  Physical Exam Physical Exam  Constitutional: She is oriented to person, place, and time. She appears well-developed and well-nourished.  Eyes: Conjunctivae are normal. No scleral icterus.  Neck: Neck supple.  Cardiovascular: Normal rate, regular rhythm and normal heart sounds.   Pulmonary/Chest: Effort normal and breath sounds normal. Right breast exhibits no inverted nipple, no mass, no nipple discharge, no skin change and no tenderness. Left breast exhibits no inverted nipple, no  mass, no nipple discharge, no skin change and no tenderness.  Abdominal: Soft. Bowel sounds are normal. There is no tenderness.  Lymphadenopathy:    She has no cervical adenopathy.    She has no axillary adenopathy.  Neurological: She is alert and oriented to person, place, and time.  Skin: Skin is warm and dry.    Data Reviewed Mammogram reviewed   Assessment    History of atypical ductal hyperplasia. She has decided to try Tamoxifen as preventive Rx. Risks and benefits discussed     Plan    Patient to return in four months bilateral screening mammogram. The patient is aware to call back for any questions or concerns. Tamoxifen daily RX sent.  HPI, Physical Exam, Assessment and Plan have been scribed under the direction and in the presence of Mckinley Jewel, MD  Gaspar Cola, CMA I have completed the exam and reviewed the above documentation for accuracy and completeness.  I agree with the above.  Haematologist has been used and any errors in dictation or transcription are unintentional.  Seeplaputhur G. Jamal Collin, M.D., F.A.C.S.   Junie Panning G 03/02/2017, 9:12 AM

## 2017-03-10 ENCOUNTER — Other Ambulatory Visit: Payer: Self-pay

## 2017-03-10 DIAGNOSIS — Z299 Encounter for prophylactic measures, unspecified: Secondary | ICD-10-CM

## 2017-03-10 NOTE — Progress Notes (Signed)
Patient came in to have blood drawn for testing per Chelsa Boswell's orders.

## 2017-03-11 LAB — CMP12+LP+TP+TSH+6AC+CBC/D/PLT
ALK PHOS: 66 IU/L (ref 39–117)
ALT: 13 IU/L (ref 0–32)
AST: 19 IU/L (ref 0–40)
Albumin/Globulin Ratio: 2 (ref 1.2–2.2)
Albumin: 4.3 g/dL (ref 3.5–5.5)
BASOS: 1 %
BUN/Creatinine Ratio: 19 (ref 9–23)
BUN: 13 mg/dL (ref 6–24)
Basophils Absolute: 0 10*3/uL (ref 0.0–0.2)
Bilirubin Total: 0.3 mg/dL (ref 0.0–1.2)
CALCIUM: 9.3 mg/dL (ref 8.7–10.2)
CHLORIDE: 98 mmol/L (ref 96–106)
CHOL/HDL RATIO: 3.1 ratio (ref 0.0–4.4)
CREATININE: 0.69 mg/dL (ref 0.57–1.00)
Cholesterol, Total: 191 mg/dL (ref 100–199)
EOS (ABSOLUTE): 0.2 10*3/uL (ref 0.0–0.4)
Eos: 3 %
Estimated CHD Risk: <0.5 times avg. (ref 0.0–1.0)
Free Thyroxine Index: 1.6 (ref 1.2–4.9)
GFR, EST AFRICAN AMERICAN: 111 mL/min/{1.73_m2} (ref >59)
GFR, EST NON AFRICAN AMERICAN: 96 mL/min/{1.73_m2} (ref >59)
GGT: 18 IU/L (ref 0–60)
GLUCOSE: 104 mg/dL — AB (ref 65–99)
Globulin, Total: 2.1 g/dL (ref 1.5–4.5)
HDL: 61 mg/dL (ref >39)
HEMATOCRIT: 40.9 % (ref 34.0–46.6)
Hemoglobin: 13.4 g/dL (ref 11.1–15.9)
IMMATURE GRANS (ABS): 0 10*3/uL (ref 0.0–0.1)
Immature Granulocytes: 0 %
Iron: 102 ug/dL (ref 27–159)
LDH: 182 IU/L (ref 119–226)
LDL CALC: 102 mg/dL — AB (ref 0–99)
LYMPHS: 26 %
Lymphocytes Absolute: 2.1 10*3/uL (ref 0.7–3.1)
MCH: 31.5 pg (ref 26.6–33.0)
MCHC: 32.8 g/dL (ref 31.5–35.7)
MCV: 96 fL (ref 79–97)
MONOCYTES: 8 %
Monocytes Absolute: 0.6 10*3/uL (ref 0.1–0.9)
NEUTROS ABS: 5.2 10*3/uL (ref 1.4–7.0)
Neutrophils: 62 %
PHOSPHORUS: 4.5 mg/dL (ref 2.5–4.5)
POTASSIUM: 4.6 mmol/L (ref 3.5–5.2)
Platelets: 445 10*3/uL — ABNORMAL HIGH (ref 150–379)
RBC: 4.25 x10E6/uL (ref 3.77–5.28)
RDW: 13.6 % (ref 12.3–15.4)
SODIUM: 134 mmol/L (ref 134–144)
T3 Uptake Ratio: 29 % (ref 24–39)
T4 TOTAL: 5.4 ug/dL (ref 4.5–12.0)
TSH: 1.13 u[IU]/mL (ref 0.450–4.500)
Total Protein: 6.4 g/dL (ref 6.0–8.5)
Triglycerides: 138 mg/dL (ref 0–149)
URIC ACID: 3.7 mg/dL (ref 2.5–7.1)
VLDL Cholesterol Cal: 28 mg/dL (ref 5–40)
WBC: 8.2 10*3/uL (ref 3.4–10.8)

## 2017-03-11 LAB — HGB A1C W/O EAG: Hgb A1c MFr Bld: 5.4 % (ref 4.8–5.6)

## 2017-03-11 LAB — VITAMIN D 25 HYDROXY (VIT D DEFICIENCY, FRACTURES): VIT D 25 HYDROXY: 22.1 ng/mL — AB (ref 30.0–100.0)

## 2017-04-24 ENCOUNTER — Other Ambulatory Visit: Payer: Self-pay

## 2017-04-24 DIAGNOSIS — Z1231 Encounter for screening mammogram for malignant neoplasm of breast: Secondary | ICD-10-CM

## 2017-06-21 ENCOUNTER — Other Ambulatory Visit: Payer: Self-pay

## 2017-06-21 DIAGNOSIS — Z299 Encounter for prophylactic measures, unspecified: Secondary | ICD-10-CM

## 2017-06-21 NOTE — Progress Notes (Signed)
Patient came in to have blood drawn for testing per [facility] orders.

## 2017-06-23 LAB — CMP12+LP+TP+TSH+6AC+CBC/D/PLT
ALBUMIN: 4.6 g/dL (ref 3.5–5.5)
ALT: 13 IU/L (ref 0–32)
AST: 22 IU/L (ref 0–40)
Albumin/Globulin Ratio: 2.2 (ref 1.2–2.2)
Alkaline Phosphatase: 71 IU/L (ref 39–117)
BASOS ABS: 0 10*3/uL (ref 0.0–0.2)
BUN/Creatinine Ratio: 11 (ref 9–23)
BUN: 8 mg/dL (ref 6–24)
Basos: 1 %
Bilirubin Total: 0.3 mg/dL (ref 0.0–1.2)
CREATININE: 0.71 mg/dL (ref 0.57–1.00)
Calcium: 9.2 mg/dL (ref 8.7–10.2)
Chloride: 103 mmol/L (ref 96–106)
Chol/HDL Ratio: 3.3 ratio (ref 0.0–4.4)
Cholesterol, Total: 207 mg/dL — ABNORMAL HIGH (ref 100–199)
EOS (ABSOLUTE): 0.2 10*3/uL (ref 0.0–0.4)
Eos: 4 %
Estimated CHD Risk: <0.5 times avg. (ref 0.0–1.0)
Free Thyroxine Index: 1.9 (ref 1.2–4.9)
GFR calc Af Amer: 109 mL/min/{1.73_m2} (ref >59)
GFR, EST NON AFRICAN AMERICAN: 94 mL/min/{1.73_m2} (ref >59)
GGT: 22 IU/L (ref 0–60)
GLOBULIN, TOTAL: 2.1 g/dL (ref 1.5–4.5)
GLUCOSE: 79 mg/dL (ref 65–99)
HDL: 62 mg/dL (ref >39)
Hematocrit: 41.7 % (ref 34.0–46.6)
Hemoglobin: 14.1 g/dL (ref 11.1–15.9)
IRON: 115 ug/dL (ref 27–159)
Immature Grans (Abs): 0 10*3/uL (ref 0.0–0.1)
Immature Granulocytes: 0 %
LDH: 182 IU/L (ref 119–226)
LDL Calculated: 118 mg/dL — ABNORMAL HIGH (ref 0–99)
LYMPHS ABS: 1.6 10*3/uL (ref 0.7–3.1)
Lymphs: 29 %
MCH: 32 pg (ref 26.6–33.0)
MCHC: 33.8 g/dL (ref 31.5–35.7)
MCV: 95 fL (ref 79–97)
MONOS ABS: 0.6 10*3/uL (ref 0.1–0.9)
Monocytes: 11 %
Neutrophils Absolute: 3.1 10*3/uL (ref 1.4–7.0)
Neutrophils: 55 %
PHOSPHORUS: 3.8 mg/dL (ref 2.5–4.5)
PLATELETS: 483 10*3/uL — AB (ref 150–379)
POTASSIUM: 4.8 mmol/L (ref 3.5–5.2)
RBC: 4.41 x10E6/uL (ref 3.77–5.28)
RDW: 13.6 % (ref 12.3–15.4)
Sodium: 140 mmol/L (ref 134–144)
T3 UPTAKE RATIO: 29 % (ref 24–39)
T4 TOTAL: 6.5 ug/dL (ref 4.5–12.0)
TOTAL PROTEIN: 6.7 g/dL (ref 6.0–8.5)
TSH: 1.81 u[IU]/mL (ref 0.450–4.500)
Triglycerides: 133 mg/dL (ref 0–149)
URIC ACID: 3.7 mg/dL (ref 2.5–7.1)
VLDL Cholesterol Cal: 27 mg/dL (ref 5–40)
WBC: 5.5 10*3/uL (ref 3.4–10.8)

## 2017-06-23 LAB — VITAMIN D 25 HYDROXY (VIT D DEFICIENCY, FRACTURES): Vit D, 25-Hydroxy: 21.8 ng/mL — ABNORMAL LOW (ref 30.0–100.0)

## 2017-06-23 LAB — HGB A1C W/O EAG: HEMOGLOBIN A1C: 5.2 % (ref 4.8–5.6)

## 2017-06-26 ENCOUNTER — Ambulatory Visit
Admission: RE | Admit: 2017-06-26 | Discharge: 2017-06-26 | Disposition: A | Discharge status: Home / Self Care | Payer: Managed Care, Other (non HMO) | Source: Ambulatory Visit | Attending: General Surgery | Admitting: General Surgery

## 2017-06-26 DIAGNOSIS — Z1231 Encounter for screening mammogram for malignant neoplasm of breast: Secondary | ICD-10-CM | POA: Insufficient documentation

## 2017-07-04 ENCOUNTER — Telehealth: Payer: Self-pay | Admitting: *Deleted

## 2017-07-04 NOTE — Telephone Encounter (Signed)
Patient called stated that she had her mammogram and everything was fine. I did advise her that Dr.Sankar still likes to follow up with his patients to do a breast exam and scheduled future mammogram appointments but patient still wanted to cancel appointment and not reschedule.

## 2017-07-06 ENCOUNTER — Ambulatory Visit: Payer: Managed Care, Other (non HMO) | Admitting: General Surgery

## 2017-12-29 ENCOUNTER — Other Ambulatory Visit: Payer: Self-pay

## 2017-12-29 DIAGNOSIS — E785 Hyperlipidemia, unspecified: Secondary | ICD-10-CM

## 2017-12-29 DIAGNOSIS — R5383 Other fatigue: Secondary | ICD-10-CM

## 2017-12-29 DIAGNOSIS — I10 Essential (primary) hypertension: Secondary | ICD-10-CM

## 2017-12-29 DIAGNOSIS — R7301 Impaired fasting glucose: Secondary | ICD-10-CM

## 2017-12-30 LAB — CBC WITH DIFFERENTIAL/PLATELET
BASOS ABS: 0.1 10*3/uL (ref 0.0–0.2)
BASOS: 1 %
EOS (ABSOLUTE): 0.3 10*3/uL (ref 0.0–0.4)
Eos: 4 %
Hematocrit: 39.7 % (ref 34.0–46.6)
Hemoglobin: 13.2 g/dL (ref 11.1–15.9)
IMMATURE GRANULOCYTES: 0 %
Immature Grans (Abs): 0 10*3/uL (ref 0.0–0.1)
LYMPHS: 30 %
Lymphocytes Absolute: 2.1 10*3/uL (ref 0.7–3.1)
MCH: 31.4 pg (ref 26.6–33.0)
MCHC: 33.2 g/dL (ref 31.5–35.7)
MCV: 95 fL (ref 79–97)
Monocytes Absolute: 0.6 10*3/uL (ref 0.1–0.9)
Monocytes: 9 %
NEUTROS PCT: 56 %
Neutrophils Absolute: 3.8 10*3/uL (ref 1.4–7.0)
PLATELETS: 522 10*3/uL — AB (ref 150–379)
RBC: 4.2 x10E6/uL (ref 3.77–5.28)
RDW: 13.7 % (ref 12.3–15.4)
WBC: 6.8 10*3/uL (ref 3.4–10.8)

## 2017-12-30 LAB — COMPREHENSIVE METABOLIC PANEL
A/G RATIO: 2 (ref 1.2–2.2)
ALT: 9 IU/L (ref 0–32)
AST: 14 IU/L (ref 0–40)
Albumin: 4.2 g/dL (ref 3.5–5.5)
Alkaline Phosphatase: 66 IU/L (ref 39–117)
BUN/Creatinine Ratio: 10 (ref 9–23)
BUN: 8 mg/dL (ref 6–24)
Bilirubin Total: 0.4 mg/dL (ref 0.0–1.2)
CALCIUM: 9.6 mg/dL (ref 8.7–10.2)
CO2: 24 mmol/L (ref 20–29)
CREATININE: 0.84 mg/dL (ref 0.57–1.00)
Chloride: 102 mmol/L (ref 96–106)
GFR, EST AFRICAN AMERICAN: 89 mL/min/{1.73_m2} (ref >59)
GFR, EST NON AFRICAN AMERICAN: 77 mL/min/{1.73_m2} (ref >59)
GLOBULIN, TOTAL: 2.1 g/dL (ref 1.5–4.5)
Glucose: 104 mg/dL — ABNORMAL HIGH (ref 65–99)
POTASSIUM: 4.4 mmol/L (ref 3.5–5.2)
Sodium: 137 mmol/L (ref 134–144)
TOTAL PROTEIN: 6.3 g/dL (ref 6.0–8.5)

## 2017-12-30 LAB — LIPID PANEL
CHOL/HDL RATIO: 2.9 ratio (ref 0.0–4.4)
CHOLESTEROL TOTAL: 182 mg/dL (ref 100–199)
HDL: 62 mg/dL (ref >39)
LDL Calculated: 106 mg/dL — ABNORMAL HIGH (ref 0–99)
TRIGLYCERIDES: 70 mg/dL (ref 0–149)
VLDL Cholesterol Cal: 14 mg/dL (ref 5–40)

## 2017-12-30 LAB — HGB A1C W/O EAG: HEMOGLOBIN A1C: 5.5 % (ref 4.8–5.6)

## 2017-12-30 LAB — TSH: TSH: 1.98 u[IU]/mL (ref 0.450–4.500)

## 2018-02-06 ENCOUNTER — Other Ambulatory Visit: Payer: Self-pay | Admitting: Nurse Practitioner

## 2018-02-06 DIAGNOSIS — Z1231 Encounter for screening mammogram for malignant neoplasm of breast: Secondary | ICD-10-CM

## 2018-04-28 IMAGING — MG 2D DIGITAL SCREENING BILATERAL MAMMOGRAM WITH CAD AND ADJUNCT TO
8 of 12 series · 8 of 28 positions shown · non-contrast
Comparison: Previous exam(s).

CLINICAL DATA: Screening.

EXAM:
2D DIGITAL SCREENING BILATERAL MAMMOGRAM WITH CAD AND ADJUNCT TOMO

[L MLO]
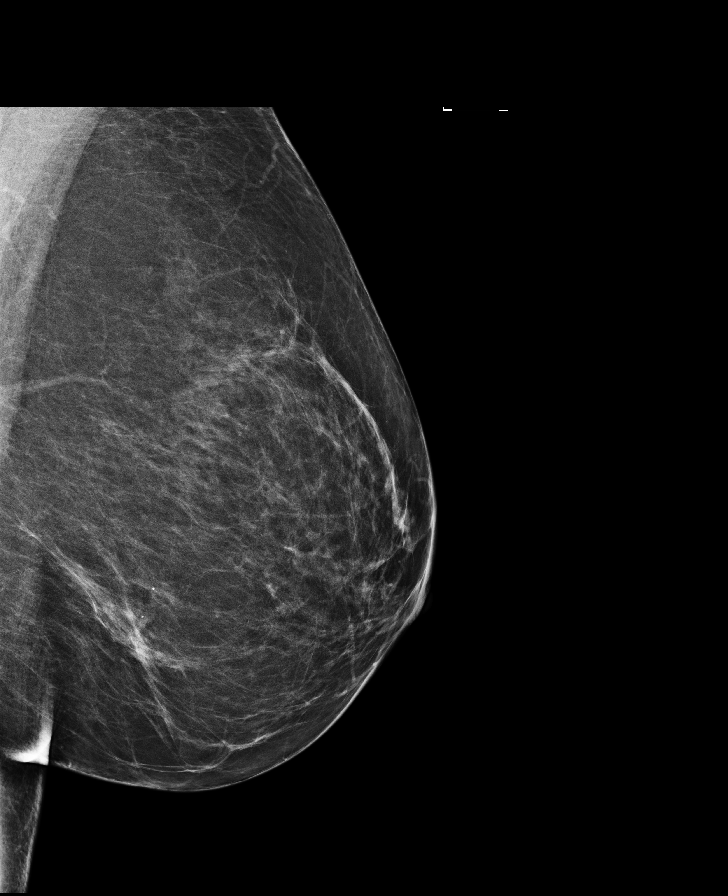

[R CC]
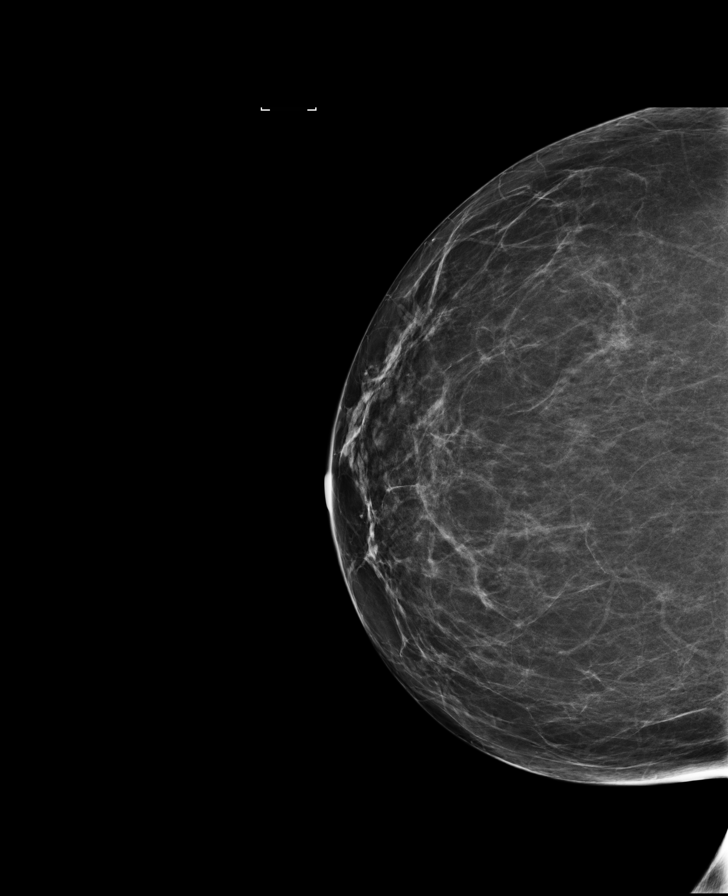

[L CC synth-2D]
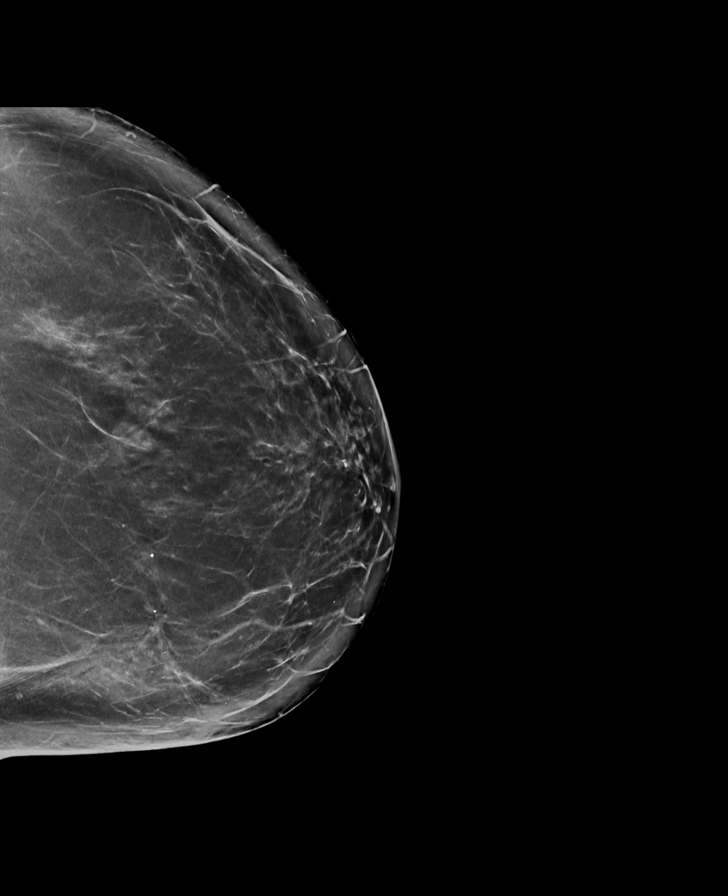

[L CC]
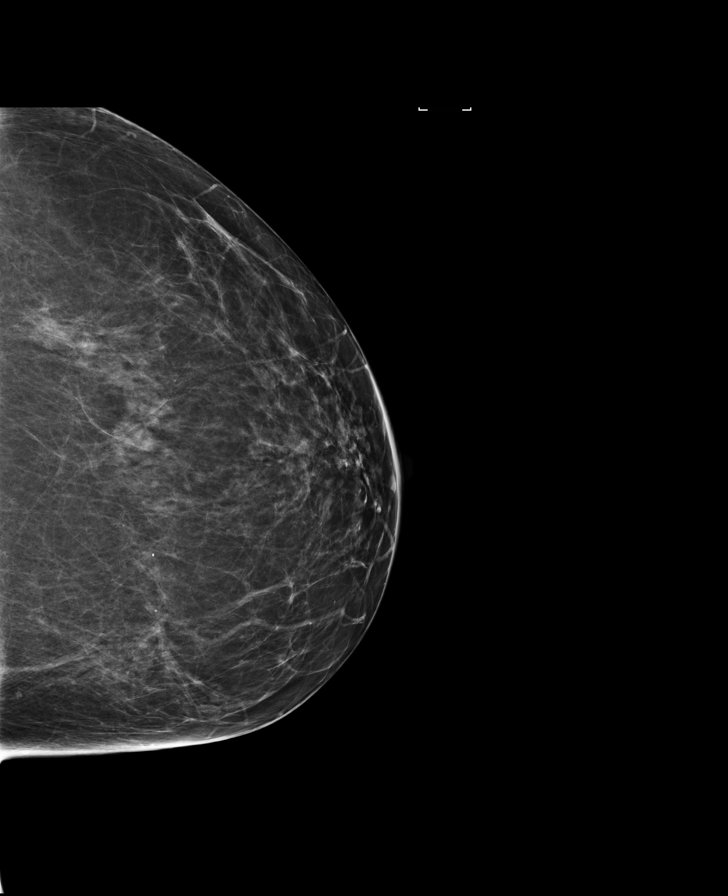

[R MLO synth-2D]
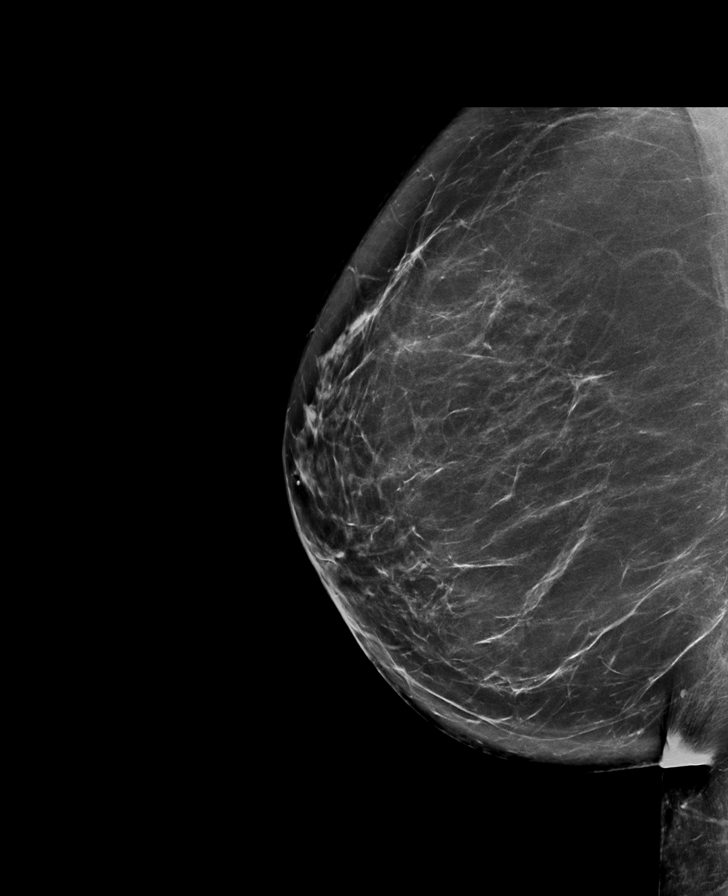

[R MLO]
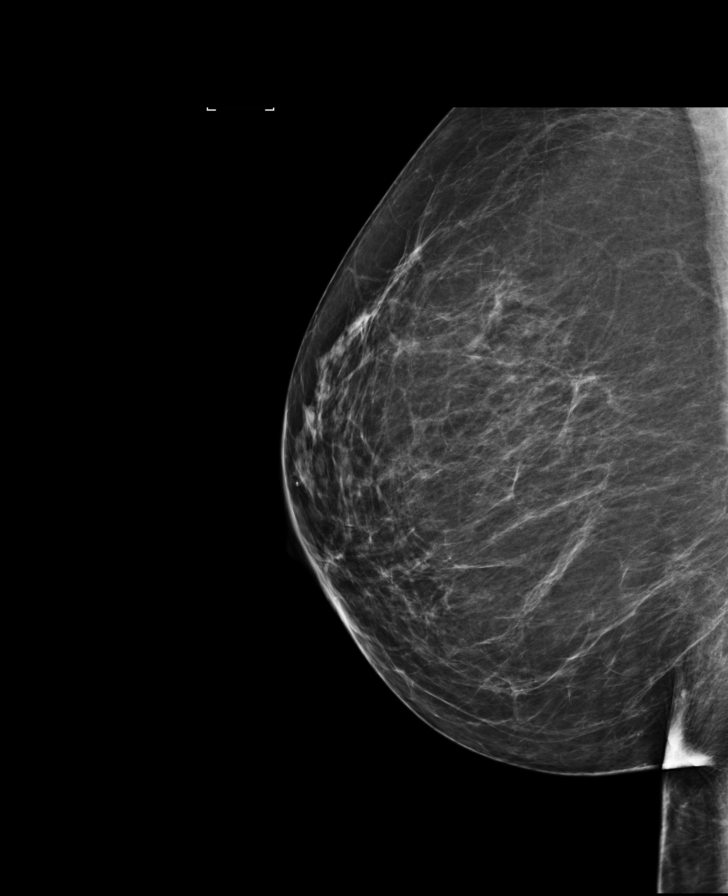

[L MLO synth-2D]
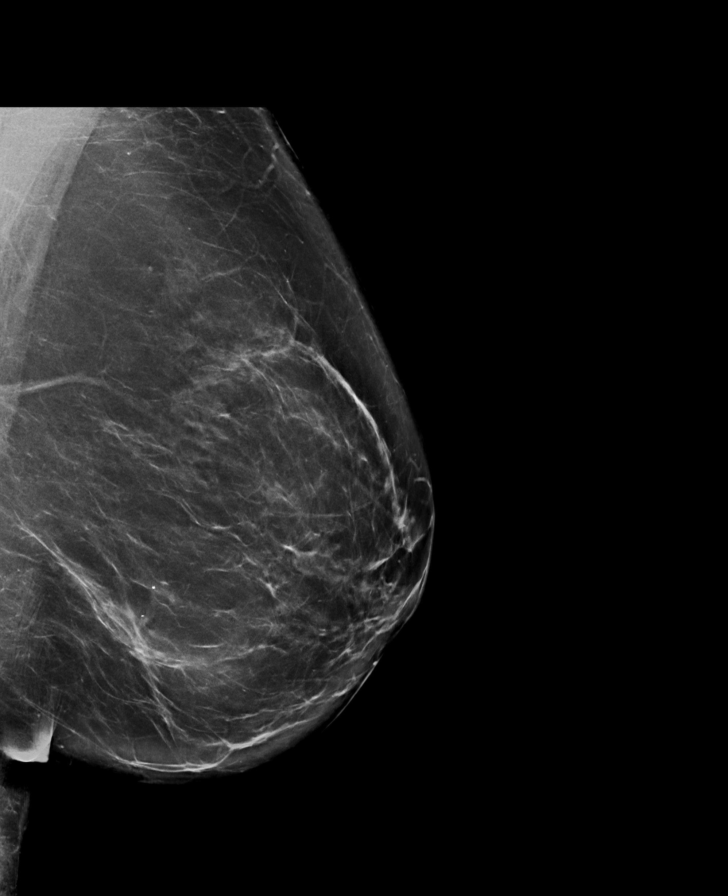

[R CC synth-2D]
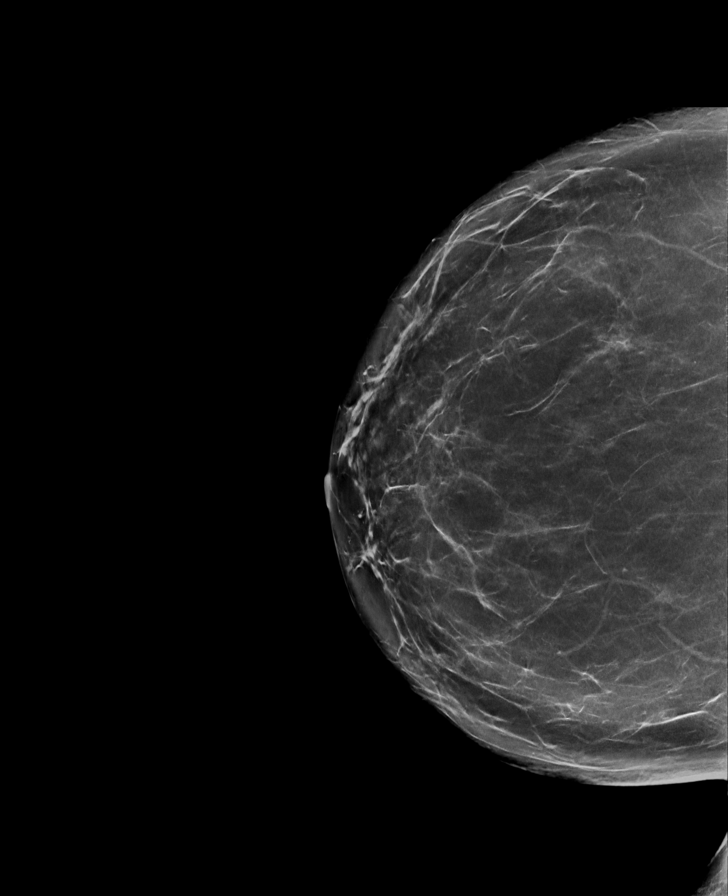

[8 of 28 positions shown; findings below may reference images not displayed]

ACR Breast Density Category b: There are scattered areas of
fibroglandular density.
FINDINGS: In the left breast, possible distortion warrants further evaluation.
In the right breast, no findings suspicious for malignancy. Images
were processed with CAD.
IMPRESSION: Further evaluation is suggested for possible distortion in the left
breast.

RECOMMENDATION:
Diagnostic mammogram and possibly ultrasound of the left breast.
(Code:[id number])

The patient will be contacted regarding the findings, and additional
imaging will be scheduled.

BI-RADS CATEGORY  0: Incomplete. Need additional imaging evaluation
and/or prior mammograms for comparison.

## 2018-05-11 ENCOUNTER — Other Ambulatory Visit: Payer: Self-pay

## 2018-05-11 DIAGNOSIS — R5383 Other fatigue: Secondary | ICD-10-CM

## 2018-05-11 DIAGNOSIS — E785 Hyperlipidemia, unspecified: Secondary | ICD-10-CM

## 2018-05-11 DIAGNOSIS — R7301 Impaired fasting glucose: Secondary | ICD-10-CM

## 2018-05-12 LAB — CBC WITH DIFFERENTIAL/PLATELET
BASOS ABS: 0.1 10*3/uL (ref 0.0–0.2)
Basos: 1 %
EOS (ABSOLUTE): 0.2 10*3/uL (ref 0.0–0.4)
Eos: 4 %
Hematocrit: 41.5 % (ref 34.0–46.6)
Hemoglobin: 13.7 g/dL (ref 11.1–15.9)
Immature Grans (Abs): 0 10*3/uL (ref 0.0–0.1)
Immature Granulocytes: 1 %
LYMPHS ABS: 1.5 10*3/uL (ref 0.7–3.1)
Lymphs: 26 %
MCH: 30.6 pg (ref 26.6–33.0)
MCHC: 33 g/dL (ref 31.5–35.7)
MCV: 93 fL (ref 79–97)
MONOS ABS: 0.5 10*3/uL (ref 0.1–0.9)
Monocytes: 9 %
Neutrophils Absolute: 3.4 10*3/uL (ref 1.4–7.0)
Neutrophils: 59 %
Platelets: 481 10*3/uL — ABNORMAL HIGH (ref 150–450)
RBC: 4.47 x10E6/uL (ref 3.77–5.28)
RDW: 12.5 % (ref 12.3–15.4)
WBC: 5.8 10*3/uL (ref 3.4–10.8)

## 2018-05-12 LAB — LIPID PANEL
Chol/HDL Ratio: 3.6 ratio (ref 0.0–4.4)
Cholesterol, Total: 206 mg/dL — ABNORMAL HIGH (ref 100–199)
HDL: 57 mg/dL (ref >39)
LDL Calculated: 117 mg/dL — ABNORMAL HIGH (ref 0–99)
Triglycerides: 162 mg/dL — ABNORMAL HIGH (ref 0–149)
VLDL CHOLESTEROL CAL: 32 mg/dL (ref 5–40)

## 2018-05-12 LAB — COMPREHENSIVE METABOLIC PANEL
ALK PHOS: 64 IU/L (ref 39–117)
ALT: 16 IU/L (ref 0–32)
AST: 20 IU/L (ref 0–40)
Albumin/Globulin Ratio: 1.9 (ref 1.2–2.2)
Albumin: 4.2 g/dL (ref 3.5–5.5)
BUN/Creatinine Ratio: 13 (ref 9–23)
BUN: 10 mg/dL (ref 6–24)
Bilirubin Total: 0.3 mg/dL (ref 0.0–1.2)
CO2: 24 mmol/L (ref 20–29)
CREATININE: 0.8 mg/dL (ref 0.57–1.00)
Calcium: 9.2 mg/dL (ref 8.7–10.2)
Chloride: 103 mmol/L (ref 96–106)
GFR calc Af Amer: 93 mL/min/{1.73_m2} (ref >59)
GFR calc non Af Amer: 81 mL/min/{1.73_m2} (ref >59)
GLUCOSE: 79 mg/dL (ref 65–99)
Globulin, Total: 2.2 g/dL (ref 1.5–4.5)
Potassium: 4.5 mmol/L (ref 3.5–5.2)
SODIUM: 139 mmol/L (ref 134–144)
Total Protein: 6.4 g/dL (ref 6.0–8.5)

## 2018-05-12 LAB — HGB A1C W/O EAG: HEMOGLOBIN A1C: 5.6 % (ref 4.8–5.6)

## 2018-05-12 LAB — TSH: TSH: 1.35 u[IU]/mL (ref 0.450–4.500)

## 2018-06-27 ENCOUNTER — Ambulatory Visit
Admission: RE | Admit: 2018-06-27 | Discharge: 2018-06-27 | Disposition: A | Discharge status: Home / Self Care | Payer: Managed Care, Other (non HMO) | Source: Ambulatory Visit | Attending: Nurse Practitioner | Admitting: Nurse Practitioner

## 2018-06-27 DIAGNOSIS — Z1231 Encounter for screening mammogram for malignant neoplasm of breast: Secondary | ICD-10-CM | POA: Insufficient documentation

## 2018-08-24 ENCOUNTER — Other Ambulatory Visit: Payer: Self-pay

## 2018-08-24 DIAGNOSIS — I10 Essential (primary) hypertension: Secondary | ICD-10-CM

## 2018-08-24 DIAGNOSIS — R5383 Other fatigue: Secondary | ICD-10-CM

## 2018-08-24 DIAGNOSIS — E785 Hyperlipidemia, unspecified: Secondary | ICD-10-CM

## 2018-08-24 DIAGNOSIS — R7301 Impaired fasting glucose: Secondary | ICD-10-CM

## 2018-08-25 LAB — LIPID PANEL WITH LDL/HDL RATIO
Cholesterol, Total: 209 mg/dL — ABNORMAL HIGH (ref 100–199)
HDL: 63 mg/dL (ref >39)
LDL Calculated: 122 mg/dL — ABNORMAL HIGH (ref 0–99)
LDl/HDL Ratio: 1.9 ratio (ref 0.0–3.2)
Triglycerides: 118 mg/dL (ref 0–149)
VLDL CHOLESTEROL CAL: 24 mg/dL (ref 5–40)

## 2018-08-25 LAB — COMPREHENSIVE METABOLIC PANEL
ALT: 13 IU/L (ref 0–32)
AST: 19 IU/L (ref 0–40)
Albumin/Globulin Ratio: 2 (ref 1.2–2.2)
Albumin: 4.5 g/dL (ref 3.5–5.5)
Alkaline Phosphatase: 72 IU/L (ref 39–117)
BILIRUBIN TOTAL: 0.3 mg/dL (ref 0.0–1.2)
BUN/Creatinine Ratio: 10 (ref 9–23)
BUN: 8 mg/dL (ref 6–24)
CHLORIDE: 100 mmol/L (ref 96–106)
CO2: 22 mmol/L (ref 20–29)
CREATININE: 0.77 mg/dL (ref 0.57–1.00)
Calcium: 9.7 mg/dL (ref 8.7–10.2)
GFR calc non Af Amer: 85 mL/min/{1.73_m2} (ref >59)
GFR, EST AFRICAN AMERICAN: 98 mL/min/{1.73_m2} (ref >59)
GLUCOSE: 71 mg/dL (ref 65–99)
Globulin, Total: 2.2 g/dL (ref 1.5–4.5)
Potassium: 5 mmol/L (ref 3.5–5.2)
Sodium: 140 mmol/L (ref 134–144)
TOTAL PROTEIN: 6.7 g/dL (ref 6.0–8.5)

## 2018-08-25 LAB — HGB A1C W/O EAG: HEMOGLOBIN A1C: 5.5 % (ref 4.8–5.6)

## 2018-08-25 LAB — TSH: TSH: 1.7 u[IU]/mL (ref 0.450–4.500)

## 2018-08-27 NOTE — Progress Notes (Signed)
Lab Results from 08/24/18 have been routed to the ordering Provider Evern Bio NP 08/27/18 -[place]

## 2018-12-31 ENCOUNTER — Other Ambulatory Visit: Payer: Managed Care, Other (non HMO)

## 2019-01-01 ENCOUNTER — Other Ambulatory Visit: Payer: Managed Care, Other (non HMO)

## 2019-01-01 ENCOUNTER — Other Ambulatory Visit: Payer: Self-pay

## 2019-01-01 DIAGNOSIS — R5383 Other fatigue: Secondary | ICD-10-CM | POA: Diagnosis not present

## 2019-01-01 DIAGNOSIS — E785 Hyperlipidemia, unspecified: Secondary | ICD-10-CM | POA: Diagnosis not present

## 2019-01-01 DIAGNOSIS — E119 Type 2 diabetes mellitus without complications: Secondary | ICD-10-CM

## 2019-01-01 DIAGNOSIS — I10 Essential (primary) hypertension: Secondary | ICD-10-CM

## 2019-01-02 LAB — CBC WITH DIFFERENTIAL/PLATELET
Basophils Absolute: 0.1 10*3/uL (ref 0.0–0.2)
Basos: 1 %
EOS (ABSOLUTE): 0.2 10*3/uL (ref 0.0–0.4)
EOS: 3 %
HEMOGLOBIN: 14.5 g/dL (ref 11.1–15.9)
Hematocrit: 43.1 % (ref 34.0–46.6)
Immature Grans (Abs): 0 10*3/uL (ref 0.0–0.1)
Immature Granulocytes: 0 %
LYMPHS: 17 %
Lymphocytes Absolute: 1.4 10*3/uL (ref 0.7–3.1)
MCH: 31.5 pg (ref 26.6–33.0)
MCHC: 33.6 g/dL (ref 31.5–35.7)
MCV: 94 fL (ref 79–97)
MONOS ABS: 0.7 10*3/uL (ref 0.1–0.9)
Monocytes: 9 %
NEUTROS PCT: 70 %
Neutrophils Absolute: 5.7 10*3/uL (ref 1.4–7.0)
PLATELETS: 500 10*3/uL — AB (ref 150–450)
RBC: 4.61 x10E6/uL (ref 3.77–5.28)
RDW: 12.6 % (ref 11.7–15.4)
WBC: 8.2 10*3/uL (ref 3.4–10.8)

## 2019-01-02 LAB — COMPREHENSIVE METABOLIC PANEL
ALBUMIN: 4.6 g/dL (ref 3.8–4.9)
ALT: 18 IU/L (ref 0–32)
AST: 21 IU/L (ref 0–40)
Albumin/Globulin Ratio: 2 (ref 1.2–2.2)
Alkaline Phosphatase: 72 IU/L (ref 39–117)
BUN/Creatinine Ratio: 12 (ref 9–23)
BUN: 9 mg/dL (ref 6–24)
Bilirubin Total: 0.6 mg/dL (ref 0.0–1.2)
CO2: 20 mmol/L (ref 20–29)
Calcium: 9.6 mg/dL (ref 8.7–10.2)
Chloride: 99 mmol/L (ref 96–106)
Creatinine, Ser: 0.73 mg/dL (ref 0.57–1.00)
GFR calc non Af Amer: 90 mL/min/{1.73_m2} (ref >59)
GFR, EST AFRICAN AMERICAN: 104 mL/min/{1.73_m2} (ref >59)
Globulin, Total: 2.3 g/dL (ref 1.5–4.5)
Glucose: 76 mg/dL (ref 65–99)
Potassium: 5.3 mmol/L — ABNORMAL HIGH (ref 3.5–5.2)
Sodium: 139 mmol/L (ref 134–144)
Total Protein: 6.9 g/dL (ref 6.0–8.5)

## 2019-01-02 LAB — LIPID PANEL WITH LDL/HDL RATIO
Cholesterol, Total: 220 mg/dL — ABNORMAL HIGH (ref 100–199)
HDL: 78 mg/dL (ref >39)
LDL Calculated: 129 mg/dL — ABNORMAL HIGH (ref 0–99)
LDL/HDL RATIO: 1.7 ratio (ref 0.0–3.2)
Triglycerides: 67 mg/dL (ref 0–149)
VLDL Cholesterol Cal: 13 mg/dL (ref 5–40)

## 2019-01-02 LAB — TSH: TSH: 1.67 u[IU]/mL (ref 0.450–4.500)

## 2019-01-02 LAB — HGB A1C W/O EAG: Hgb A1c MFr Bld: 5.2 % (ref 4.8–5.6)

## 2019-01-24 NOTE — Addendum Note (Signed)
Addended by: Judie Petit on: 01/24/2019 11:49 AM   Modules accepted: Level of Service

## 2019-03-06 ENCOUNTER — Ambulatory Visit: Payer: Self-pay | Admitting: Adult Health

## 2019-03-06 ENCOUNTER — Ambulatory Visit: Payer: Managed Care, Other (non HMO) | Admitting: Adult Health

## 2019-05-03 ENCOUNTER — Encounter: Payer: Self-pay | Admitting: Adult Health

## 2019-05-03 ENCOUNTER — Ambulatory Visit: Payer: Managed Care, Other (non HMO) | Admitting: Adult Health

## 2019-05-03 ENCOUNTER — Other Ambulatory Visit: Payer: Managed Care, Other (non HMO)

## 2019-05-03 ENCOUNTER — Other Ambulatory Visit: Payer: Self-pay

## 2019-05-03 VITALS — BP 103/76 | HR 72 | Temp 97.8°F | Resp 18 | Ht 66.0 in | Wt 140.0 lb

## 2019-05-03 DIAGNOSIS — E785 Hyperlipidemia, unspecified: Secondary | ICD-10-CM | POA: Diagnosis not present

## 2019-05-03 DIAGNOSIS — R5383 Other fatigue: Secondary | ICD-10-CM | POA: Diagnosis not present

## 2019-05-03 DIAGNOSIS — H60391 Other infective otitis externa, right ear: Secondary | ICD-10-CM | POA: Diagnosis not present

## 2019-05-03 DIAGNOSIS — Z008 Encounter for other general examination: Secondary | ICD-10-CM | POA: Diagnosis not present

## 2019-05-03 DIAGNOSIS — R7303 Prediabetes: Secondary | ICD-10-CM | POA: Diagnosis not present

## 2019-05-03 DIAGNOSIS — Z0189 Encounter for other specified special examinations: Secondary | ICD-10-CM | POA: Diagnosis not present

## 2019-05-03 DIAGNOSIS — E559 Vitamin D deficiency, unspecified: Secondary | ICD-10-CM

## 2019-05-03 MED ORDER — NEOMYCIN-POLYMYXIN-HC 3.5-10000-1 OT SOLN: 3.0000 [drp] | Freq: Three times a day (TID) | OTIC | 0 refills | Status: DC

## 2019-05-03 NOTE — Progress Notes (Signed)
Lab draw with orders only. Results will be sent to the ordering provider.

## 2019-05-03 NOTE — Progress Notes (Signed)
Labs completed in lab draw from PCP orders.

## 2019-05-03 NOTE — Progress Notes (Signed)
[facility]  Christine Sparks DOB: 58 y.o. MRN: [id number]  Subjective:  Here for Biometric Screen/brief exam Patient is a 58 year old female in no acute distress who comes to the clinic for her biometric screening with brief physical.  She reports that she is doing well, she does have pain in her right ear on the skin when she touches it.  She thinks that maybe she scratched her ear last week.  She denies drainage or any moderate to severe pain. She is a smoker and does not desire to stop at this time. Patient works as a Research scientist (physical sciences) at the [employer] and reports that it is much lower now since they are not seeing clients due to COVID-19. She tries to eat a healthy diet and maintain a healthy lifestyle though reports that this could use improvement.  Patient  denies any fever, body aches,chills, rash, chest pain, shortness of breath, nausea, vomiting, or diarrhea.  Patient reports that she has a primary care provider Margurite Auerbach nurse practitioner who she sees for her female health maintenance and other routine labs, she sees this person regularly.  She denies any other concerns at this visit.  Objective: Blood pressure 103/76, pulse 72, temperature 97.8 F (36.6 C), temperature source Temporal, resp. rate 18, height 5\' 6"  (1.676 m), weight 140 lb (63.5 kg), SpO2 97 %. NAD, well-developed well-nourished HEENT: Within normal limits except right ear with mild edema and ear canal and mild erythema, there is a small scratch mark evident on lower canal.  Tympanic membranes bilaterally are within normal limits. Neck: Normal, supple, no adenopathy Heart: Regular rate and rhythm Lungs: Clear to auscultation without any adventitious lung sounds  Assessment: 1. Encounter for other general examination   2. Encounter for biometric screening   3. Infective otitis externa of right ear       Plan:  Meds ordered this encounter   Medications  . neomycin-polymyxin-hydrocortisone (CORTISPORIN) OTIC solution    Sig: Place 3 drops into the right ear 3 (three) times daily.    Dispense:  10 mL    Refill:  0  Keep ear clean and dry. No Q-tips.  Advised patient call the office or your primary care doctor for an appointment if no improvement within 72 hours or if any symptoms change or worsen at any time  Advised ER or urgent Care if after hours or on weekend. Call 911 for emergency symptoms at any time.Patinet verbalized understanding of all instructions given/reviewed and treatment plan and has no further questions or concerns at this time.     Fasting glucose and lipids. Discussed with patient that today's visit here is a limited biometric screening visit (not a comprehensive exam or management of any chronic problems) Discussed some health issues, including healthy eating habits and exercise. Encouraged to follow-up with PCP for annual comprehensive preventive and wellness care (and if applicable, any chronic issues). Questions invited and answered.  Follow up with primary care as needed for chronic and maintenance health care- can be seen in this employee clinic for acute care.

## 2019-05-03 NOTE — Patient Instructions (Signed)
Hydrocortisone; Neomycin; Polymyxin B ear solution What is this medicine? HYDROCORTISONE; NEOMYCIN; and POLYMYXIN B (hye droe KOR ti sone; nee oh MYE sin; pol i MIX in B) is used to treat ear infections. This medicine may be used for other purposes; ask your health care provider or pharmacist if you have questions. COMMON BRAND NAME(S): AK-Spore HC, AK-Spore HC Otic, Antibiotic Otic, Cortisporin, Cortomycin, Oti-Sone, Oticin HC, Otimar, Otocidin What should I tell my health care provider before I take this medicine? They need to know if you have any of these conditions:  any other active infections  chronic ear infections or fluid in the ear  perforated ear drum  an unusual or allergic reaction to hydrocortisone, neomycin, polymyxin B, sulfites, other medicines, foods, dyes, or preservatives  pregnant or trying to get pregnant  breast-feeding How should I use this medicine? This medicine is only for use in the ears. Follow the directions on the prescription label. Wash hands before and after use. Clean your ear of any fluid that can be easily removed. Do not insert any object or swab into the ear canal. Gently warm the bottle by holding it in the hand for 1 to 2 minutes. Lie down on your side with the infected ear facing upward. Try not to touch the tip of the dropper to your ear, fingertips, or other surface. Squeeze the bottle gently to put the prescribed number of drops in the ear canal. Stay in this position for 30 to 60 seconds to help the drops soak into the ear. Repeat the steps for the other ear if both ears are infected. Do not use your medicine more often than directed. Finish the full course of medicine prescribed by your doctor or health care professional even if you think your condition is better. Talk to your pediatrician regarding the use of this medicine in children. While this drug may be prescribed for selected conditions, precautions do apply. Overdosage: If you think you  have taken too much of this medicine contact a poison control center or emergency room at once. NOTE: This medicine is only for you. Do not share this medicine with others. What if I miss a dose? If you miss a dose, use it as soon as you can. If it is almost time for your next dose, use only that dose. Do not take double or extra doses. What may interact with this medicine? Interactions are not expected. Do not use other ear products without talking to your doctor or health care professional. This list may not describe all possible interactions. Give your health care provider a list of all the medicines, herbs, non-prescription drugs, or dietary supplements you use. Also tell them if you smoke, drink alcohol, or use illegal drugs. Some items may interact with your medicine. What should I watch for while using this medicine? Tell your doctor or health care professional if your ear infection does not get better in a few days. Do not use longer than 10 days unless instructed by your doctor or health care professional. If rash or allergic reaction occurs, stop the product immediately and contact your physician. It is important that you keep the infected ear(s) clean and dry. When bathing, try not to get the infected ear(s) wet. Do not go swimming unless your doctor or health care professional has told you otherwise. To prevent the spread of infection, do not share ear products, or share towels and washcloths with anyone else. What side effects may I notice from receiving this  medicine? Side effects that you should report to your doctor or health care professional as soon as possible:  rash  red, itchy, dry scaly skin at the affected site  worsening ear pain Side effects that usually do not require medical attention (report to your doctor or health care professional if they continue or are bothersome):  abnormal sensation in the ear  burning or stinging while putting the drops in the ear This  list may not describe all possible side effects. Call your doctor for medical advice about side effects. You may report side effects to FDA at 1-800-FDA-1088. Where should I keep my medicine? Keep out of the reach of children. Store at room temperature between 15 and 25 degrees C (59 and 77 degrees F). Do not freeze. Throw away any unused medicine after the expiration date. NOTE: This sheet is a summary. It may not cover all possible information. If you have questions about this medicine, talk to your doctor, pharmacist, or health care provider.  2020 Elsevier/Gold Standard (2015-03-10 15:13:48) Health Maintenance, Female Adopting a healthy lifestyle and getting preventive care are important in promoting health and wellness. Ask your health care provider about:  The right schedule for you to have regular tests and exams.  Things you can do on your own to prevent diseases and keep yourself healthy. What should I know about diet, weight, and exercise? Eat a healthy diet   Eat a diet that includes plenty of vegetables, fruits, low-fat dairy products, and lean protein.  Do not eat a lot of foods that are high in solid fats, added sugars, or sodium. Maintain a healthy weight Body mass index (BMI) is used to identify weight problems. It estimates body fat based on height and weight. Your health care provider can help determine your BMI and help you achieve or maintain a healthy weight. Get regular exercise Get regular exercise. This is one of the most important things you can do for your health. Most adults should:  Exercise for at least 150 minutes each week. The exercise should increase your heart rate and make you sweat (moderate-intensity exercise).  Do strengthening exercises at least twice a week. This is in addition to the moderate-intensity exercise.  Spend less time sitting. Even light physical activity can be beneficial. Watch cholesterol and blood lipids Have your blood tested for  lipids and cholesterol at 60 years of age, then have this test every 5 years. Have your cholesterol levels checked more often if:  Your lipid or cholesterol levels are high.  You are older than 60 years of age.  You are at high risk for heart disease. What should I know about cancer screening? Depending on your health history and family history, you may need to have cancer screening at various ages. This may include screening for:  Breast cancer.  Cervical cancer.  Colorectal cancer.  Skin cancer.  Lung cancer. What should I know about heart disease, diabetes, and high blood pressure? Blood pressure and heart disease  High blood pressure causes heart disease and increases the risk of stroke. This is more likely to develop in people who have high blood pressure readings, are of African descent, or are overweight.  Have your blood pressure checked: ? Every 3-5 years if you are 7-16 years of age. ? Every year if you are 49 years old or older. Diabetes Have regular diabetes screenings. This checks your fasting blood sugar level. Have the screening done:  Once every three years after age 67 if  you are at a normal weight and have a low risk for diabetes.  More often and at a younger age if you are overweight or have a high risk for diabetes. What should I know about preventing infection? Hepatitis B If you have a higher risk for hepatitis B, you should be screened for this virus. Talk with your health care provider to find out if you are at risk for hepatitis B infection. Hepatitis C Testing is recommended for:  Everyone born from [birth year] through 1967.  Anyone with known risk factors for hepatitis C. Sexually transmitted infections (STIs)  Get screened for STIs, including gonorrhea and chlamydia, if: ? You are sexually active and are younger than 60 years of age. ? You are older than 60 years of age and your health care provider tells you that you are at risk for this type of  infection. ? Your sexual activity has changed since you were last screened, and you are at increased risk for chlamydia or gonorrhea. Ask your health care provider if you are at risk.  Ask your health care provider about whether you are at high risk for HIV. Your health care provider may recommend a prescription medicine to help prevent HIV infection. If you choose to take medicine to prevent HIV, you should first get tested for HIV. You should then be tested every 3 months for as long as you are taking the medicine. Pregnancy  If you are about to stop having your period (premenopausal) and you may become pregnant, seek counseling before you get pregnant.  Take 400 to 800 micrograms (mcg) of folic acid every day if you become pregnant.  Ask for birth control (contraception) if you want to prevent pregnancy. Osteoporosis and menopause Osteoporosis is a disease in which the bones lose minerals and strength with aging. This can result in bone fractures. If you are 57 years old or older, or if you are at risk for osteoporosis and fractures, ask your health care provider if you should:  Be screened for bone loss.  Take a calcium or vitamin D supplement to lower your risk of fractures.  Be given hormone replacement therapy (HRT) to treat symptoms of menopause. Follow these instructions at home: Lifestyle  Do not use any products that contain nicotine or tobacco, such as cigarettes, e-cigarettes, and chewing tobacco. If you need help quitting, ask your health care provider.  Do not use street drugs.  Do not share needles.  Ask your health care provider for help if you need support or information about quitting drugs. Alcohol use  Do not drink alcohol if: ? Your health care provider tells you not to drink. ? You are pregnant, may be pregnant, or are planning to become pregnant.  If you drink alcohol: ? Limit how much you use to 0-1 drink a day. ? Limit intake if you are breastfeeding.   Be aware of how much alcohol is in your drink. In the [place] one drink equals one 12 oz bottle of beer (355 mL), one 5 oz glass of wine (148 mL), or one 1 oz glass of hard liquor (44 mL). General instructions  Schedule regular health, dental, and eye exams.  Stay current with your vaccines.  Tell your health care provider if: ? You often feel depressed. ? You have ever been abused or do not feel safe at home. Summary  Adopting a healthy lifestyle and getting preventive care are important in promoting health and wellness.  Follow your health care provider's  instructions about healthy diet, exercising, and getting tested or screened for diseases.  Follow your health care provider's instructions on monitoring your cholesterol and blood pressure. This information is not intended to replace advice given to you by your health care provider. Make sure you discuss any questions you have with your health care provider. Document Released: 04/04/2011 Document Revised: 09/12/2018 Document Reviewed: 09/12/2018 Elsevier Patient Education  2020 [place]. Otitis Externa  Otitis externa is an infection of the outer ear canal. The outer ear canal is the area between the outside of the ear and the eardrum. Otitis externa is sometimes called swimmer's ear. What are the causes? Common causes of this condition include:  Swimming in dirty water.  Moisture in the ear.  An injury to the inside of the ear.  An object stuck in the ear.  A cut or scrape on the outside of the ear. What increases the risk? You are more likely to get this condition if you go swimming often. What are the signs or symptoms?  Itching in the ear. This is often the first symptom.  Swelling of the ear.  Redness in the ear.  Ear pain. The pain may get worse when you pull on your ear.  Pus coming from the ear. How is this treated? This condition may be treated with:  Antibiotic ear drops. These are often given for  10-14 days.  Medicines to reduce itching and swelling. Follow these instructions at home:  If you were given antibiotic ear drops, use them as told by your doctor. Do not stop using them even if your condition gets better.  Take over-the-counter and prescription medicines only as told by your doctor.  Avoid getting water in your ears as told by your doctor. You may be told to avoid swimming or water sports for a few days.  Keep all follow-up visits as told by your doctor. This is important. How is this prevented?  Keep your ears dry. Use the corner of a towel to dry your ears after you swim or bathe.  Try not to scratch or put things in your ear. Doing these things makes it easier for germs to grow in your ear.  Avoid swimming in lakes, dirty water, or pools that may not have the right amount of a chemical called chlorine. Contact a doctor if:  You have a fever.  Your ear is still red, swollen, or painful after 3 days.  You still have pus coming from your ear after 3 days.  Your redness, swelling, or pain gets worse.  You have a really bad headache.  You have redness, swelling, pain, or tenderness behind your ear. Summary  Otitis externa is an infection of the outer ear canal.  Symptoms include pain, redness, and swelling of the ear.  If you were given antibiotic ear drops, use them as told by your doctor. Do not stop using them even if your condition gets better.  Try not to scratch or put things in your ear. This information is not intended to replace advice given to you by your health care provider. Make sure you discuss any questions you have with your health care provider. Document Released: 03/07/2008 Document Revised: 02/23/2018 Document Reviewed: 02/23/2018 Elsevier Patient Education  2020 [employer].

## 2019-05-04 LAB — COMPREHENSIVE METABOLIC PANEL
ALT: 22 IU/L (ref 0–32)
AST: 25 IU/L (ref 0–40)
Albumin/Globulin Ratio: 2.6 — ABNORMAL HIGH (ref 1.2–2.2)
Albumin: 4.7 g/dL (ref 3.8–4.9)
Alkaline Phosphatase: 96 IU/L (ref 39–117)
BUN/Creatinine Ratio: 10 — ABNORMAL LOW (ref 12–28)
BUN: 6 mg/dL — ABNORMAL LOW (ref 8–27)
Bilirubin Total: 0.3 mg/dL (ref 0.0–1.2)
CO2: 22 mmol/L (ref 20–29)
Calcium: 9.6 mg/dL (ref 8.7–10.3)
Chloride: 99 mmol/L (ref 96–106)
Creatinine, Ser: 0.63 mg/dL (ref 0.57–1.00)
GFR calc Af Amer: 113 mL/min/{1.73_m2} (ref >59)
GFR calc non Af Amer: 98 mL/min/{1.73_m2} (ref >59)
Globulin, Total: 1.8 g/dL (ref 1.5–4.5)
Glucose: 70 mg/dL (ref 65–99)
Potassium: 4.7 mmol/L (ref 3.5–5.2)
Sodium: 139 mmol/L (ref 134–144)
Total Protein: 6.5 g/dL (ref 6.0–8.5)

## 2019-05-04 LAB — LIPID PANEL
Chol/HDL Ratio: 2.3 ratio (ref 0.0–4.4)
Cholesterol, Total: 144 mg/dL (ref 100–199)
HDL: 62 mg/dL (ref >39)
LDL Calculated: 59 mg/dL (ref 0–99)
Triglycerides: 117 mg/dL (ref 0–149)
VLDL Cholesterol Cal: 23 mg/dL (ref 5–40)

## 2019-05-04 LAB — VITAMIN D 25 HYDROXY (VIT D DEFICIENCY, FRACTURES): Vit D, 25-Hydroxy: 33.8 ng/mL (ref 30.0–100.0)

## 2019-05-04 LAB — TSH: TSH: 1.12 u[IU]/mL (ref 0.450–4.500)

## 2019-05-04 LAB — HGB A1C W/O EAG: Hgb A1c MFr Bld: 5.5 % (ref 4.8–5.6)

## 2019-05-10 ENCOUNTER — Other Ambulatory Visit: Payer: Self-pay | Admitting: Nurse Practitioner

## 2019-05-10 DIAGNOSIS — Z1231 Encounter for screening mammogram for malignant neoplasm of breast: Secondary | ICD-10-CM

## 2019-08-06 ENCOUNTER — Ambulatory Visit
Admission: RE | Admit: 2019-08-06 | Discharge: 2019-08-06 | Disposition: A | Discharge status: Home / Self Care | Payer: Managed Care, Other (non HMO) | Source: Ambulatory Visit | Attending: Nurse Practitioner | Admitting: Nurse Practitioner

## 2019-08-06 DIAGNOSIS — Z1231 Encounter for screening mammogram for malignant neoplasm of breast: Secondary | ICD-10-CM | POA: Diagnosis present

## 2019-10-02 ENCOUNTER — Other Ambulatory Visit: Payer: Managed Care, Other (non HMO)

## 2019-10-02 ENCOUNTER — Other Ambulatory Visit: Payer: Self-pay

## 2019-10-02 DIAGNOSIS — R7301 Impaired fasting glucose: Secondary | ICD-10-CM | POA: Diagnosis not present

## 2019-10-02 DIAGNOSIS — I10 Essential (primary) hypertension: Secondary | ICD-10-CM

## 2019-10-02 DIAGNOSIS — R5383 Other fatigue: Secondary | ICD-10-CM | POA: Diagnosis not present

## 2019-10-02 DIAGNOSIS — E785 Hyperlipidemia, unspecified: Secondary | ICD-10-CM | POA: Diagnosis not present

## 2019-10-02 NOTE — Addendum Note (Signed)
Addended by: Margurite Auerbach B on: 10/02/2019 09:06 AM   Modules accepted: Level of Service

## 2019-10-24 LAB — CBC WITH DIFFERENTIAL/PLATELET
Basophils Absolute: 0.1 10*3/uL (ref 0.0–0.2)
Basos: 1 %
EOS (ABSOLUTE): 0.2 10*3/uL (ref 0.0–0.4)
Eos: 1 %
Hematocrit: 42.8 % (ref 34.0–46.6)
Hemoglobin: 14.7 g/dL (ref 11.1–15.9)
Immature Grans (Abs): 0.1 10*3/uL (ref 0.0–0.1)
Immature Granulocytes: 1 %
Lymphocytes Absolute: 1.7 10*3/uL (ref 0.7–3.1)
Lymphs: 12 %
MCH: 32.9 pg (ref 26.6–33.0)
MCHC: 34.3 g/dL (ref 31.5–35.7)
MCV: 96 fL (ref 79–97)
Monocytes Absolute: 1 10*3/uL — ABNORMAL HIGH (ref 0.1–0.9)
Monocytes: 7 %
Neutrophils Absolute: 10.5 10*3/uL — ABNORMAL HIGH (ref 1.4–7.0)
Neutrophils: 78 %
Platelets: 461 10*3/uL — ABNORMAL HIGH (ref 150–450)
RBC: 4.47 x10E6/uL (ref 3.77–5.28)
RDW: 12.6 % (ref 11.7–15.4)
WBC: 13.5 10*3/uL — ABNORMAL HIGH (ref 3.4–10.8)

## 2019-10-24 LAB — COMPREHENSIVE METABOLIC PANEL
ALT: 24 IU/L (ref 0–32)
AST: 29 IU/L (ref 0–40)
Albumin/Globulin Ratio: 2.3 — ABNORMAL HIGH (ref 1.2–2.2)
Albumin: 4.5 g/dL (ref 3.8–4.9)
Alkaline Phosphatase: 102 IU/L (ref 39–117)
BUN/Creatinine Ratio: 13 (ref 12–28)
BUN: 9 mg/dL (ref 8–27)
Bilirubin Total: 0.5 mg/dL (ref 0.0–1.2)
CO2: 23 mmol/L (ref 20–29)
Calcium: 9.5 mg/dL (ref 8.7–10.3)
Chloride: 100 mmol/L (ref 96–106)
Creatinine, Ser: 0.71 mg/dL (ref 0.57–1.00)
GFR calc Af Amer: 107 mL/min/{1.73_m2} (ref >59)
GFR calc non Af Amer: 93 mL/min/{1.73_m2} (ref >59)
Globulin, Total: 2 g/dL (ref 1.5–4.5)
Glucose: 89 mg/dL (ref 65–99)
Potassium: 4.6 mmol/L (ref 3.5–5.2)
Sodium: 136 mmol/L (ref 134–144)
Total Protein: 6.5 g/dL (ref 6.0–8.5)

## 2019-10-24 LAB — HGB A1C W/O EAG: Hgb A1c MFr Bld: 5.4 % (ref 4.8–5.6)

## 2019-10-24 LAB — LIPID PANEL
Chol/HDL Ratio: 2.4 ratio (ref 0.0–4.4)
Cholesterol, Total: 178 mg/dL (ref 100–199)
HDL: 73 mg/dL (ref >39)
LDL Chol Calc (NIH): 85 mg/dL (ref 0–99)
Triglycerides: 116 mg/dL (ref 0–149)
VLDL Cholesterol Cal: 20 mg/dL (ref 5–40)

## 2019-10-24 LAB — TSH: TSH: 1.34 u[IU]/mL (ref 0.450–4.500)

## 2020-02-05 ENCOUNTER — Other Ambulatory Visit: Payer: Managed Care, Other (non HMO)

## 2020-02-05 ENCOUNTER — Other Ambulatory Visit: Payer: Self-pay

## 2020-02-05 DIAGNOSIS — E785 Hyperlipidemia, unspecified: Secondary | ICD-10-CM | POA: Diagnosis not present

## 2020-02-05 DIAGNOSIS — E559 Vitamin D deficiency, unspecified: Secondary | ICD-10-CM

## 2020-02-05 DIAGNOSIS — E119 Type 2 diabetes mellitus without complications: Secondary | ICD-10-CM | POA: Diagnosis not present

## 2020-02-06 LAB — COMPREHENSIVE METABOLIC PANEL
ALT: 22 IU/L (ref 0–32)
AST: 24 IU/L (ref 0–40)
Albumin/Globulin Ratio: 2 (ref 1.2–2.2)
Albumin: 4.3 g/dL (ref 3.8–4.9)
Alkaline Phosphatase: 95 IU/L (ref 39–117)
BUN/Creatinine Ratio: 11 — ABNORMAL LOW (ref 12–28)
BUN: 8 mg/dL (ref 8–27)
Bilirubin Total: 0.4 mg/dL (ref 0.0–1.2)
CO2: 21 mmol/L (ref 20–29)
Calcium: 9.3 mg/dL (ref 8.7–10.3)
Chloride: 101 mmol/L (ref 96–106)
Creatinine, Ser: 0.7 mg/dL (ref 0.57–1.00)
GFR calc Af Amer: 109 mL/min/{1.73_m2} (ref >59)
GFR calc non Af Amer: 94 mL/min/{1.73_m2} (ref >59)
Globulin, Total: 2.1 g/dL (ref 1.5–4.5)
Glucose: 93 mg/dL (ref 65–99)
Potassium: 4.7 mmol/L (ref 3.5–5.2)
Sodium: 138 mmol/L (ref 134–144)
Total Protein: 6.4 g/dL (ref 6.0–8.5)

## 2020-02-06 LAB — LIPID PANEL
Chol/HDL Ratio: 2.3 ratio (ref 0.0–4.4)
Cholesterol, Total: 141 mg/dL (ref 100–199)
HDL: 62 mg/dL (ref >39)
LDL Chol Calc (NIH): 64 mg/dL (ref 0–99)
Triglycerides: 77 mg/dL (ref 0–149)
VLDL Cholesterol Cal: 15 mg/dL (ref 5–40)

## 2020-02-06 LAB — TSH: TSH: 1.16 u[IU]/mL (ref 0.450–4.500)

## 2020-02-06 LAB — VITAMIN D 25 HYDROXY (VIT D DEFICIENCY, FRACTURES): Vit D, 25-Hydroxy: 21.4 ng/mL — ABNORMAL LOW (ref 30.0–100.0)

## 2020-02-06 LAB — HGB A1C W/O EAG: Hgb A1c MFr Bld: 5.5 % (ref 4.8–5.6)

## 2020-02-17 ENCOUNTER — Other Ambulatory Visit: Payer: Self-pay | Admitting: Nurse Practitioner

## 2020-02-17 DIAGNOSIS — Z1231 Encounter for screening mammogram for malignant neoplasm of breast: Secondary | ICD-10-CM

## 2020-03-23 ENCOUNTER — Ambulatory Visit: Payer: Managed Care, Other (non HMO) | Admitting: Physician Assistant

## 2020-03-23 ENCOUNTER — Other Ambulatory Visit: Payer: Self-pay

## 2020-03-23 VITALS — BP 144/70 | HR 76 | Temp 98.1°F | Resp 16 | Ht 65.0 in | Wt 132.0 lb

## 2020-03-23 DIAGNOSIS — S80812A Abrasion, left lower leg, initial encounter: Secondary | ICD-10-CM

## 2020-03-23 MED ORDER — MUPIROCIN 2 % EX OINT: 1.0000 "application " | TOPICAL_OINTMENT | Freq: Three times a day (TID) | CUTANEOUS | 0 refills | Status: DC

## 2020-03-23 NOTE — Progress Notes (Signed)
° °  Subjective:    Patient ID: Christine Sparks, female    DOB: 02/28/1961, 59 y.o.   MRN: [id number]  HPI  59 yo F walking in the yard over the weekend. Later recognized tiny skin injury left medial lower leg she suspects was in insect bite. Has been irritated and itchy- trying not to scratch . Peripheral mild tenderness. No fever or malaise, drainage or discharge from injury site.  No hx of recognized allergies. No systemic response Questions need for antibiotics  Obvious smoker- clears chest with cough. Smoked on way here in the car. No SOB exacerbation  Review of Systems As noted above    Objective:   Physical Exam  Female patient slightly anxious about less than 0.5 cm injury medial surface left lower leg. Appears to have been scratched with abrasion resulting, and now healing. Minimal pale pink halo surrounding . No discharge or draining. Mild tenderness to palpation, no swelling Skin is dry on bilateral LEs , no edema  Ambulatory without hesitation- wearing ballet flats   Lungs bilateral mild wheeze, requested cough non-productive- stats per usual per patient      Assessment & Plan:  Skin injury left lower extremity- insect bite suspected, scratch abrasion  Benadryl or comparable Itch stick prn Tylenol or ibuprofen if desired  Mupirocin 2 % oint with occlusive bandaid to minimize scratch trauma for a few days.  skin injury  Discussed buy back, 1/2 cigarette techniques to cut back cigarettes encouraged.  To notify PCP if increase in irritation, evidence of infection or swelling , streaking , discomfort develops left lower leg

## 2020-03-23 NOTE — Progress Notes (Deleted)
59 yo F working in the yard over the weekend Became aware of tiny skin injury medial left lower leg late in the day- suspects insect bite origin Has been itchy and mildly tender. Has used no topical Rx Complains of localized tenderness Has not used tylenol or ibuprofen

## 2020-03-23 NOTE — Progress Notes (Deleted)
Patient ID: Christine Sparks, female   DOB: March 21, 1961, 59 y.o.   MRN: [id number]

## 2020-03-23 NOTE — Progress Notes (Deleted)
  Subjective:    Patient ID: Christine Sparks, female    DOB: 1961-09-24, 59 y.o.   MRN: [id number]      Review of Systems     Objective:   Physical Exam        Assessment & Plan:

## 2020-03-23 NOTE — Progress Notes (Deleted)
  Subjective:     Patient ID: Christine Sparks, female   DOB: 1961-04-16, 59 y.o.   MRN: [id number]  HPI   Review of Systems     Objective:   Physical Exam     Assessment:     ***    Plan:     ***

## 2020-09-02 ENCOUNTER — Other Ambulatory Visit: Payer: Self-pay

## 2020-09-02 ENCOUNTER — Ambulatory Visit
Admission: RE | Admit: 2020-09-02 | Discharge: 2020-09-02 | Disposition: A | Discharge status: Home / Self Care | Payer: Managed Care, Other (non HMO) | Source: Ambulatory Visit | Attending: Nurse Practitioner | Admitting: Nurse Practitioner

## 2020-09-02 DIAGNOSIS — Z1231 Encounter for screening mammogram for malignant neoplasm of breast: Secondary | ICD-10-CM | POA: Diagnosis present

## 2020-10-07 ENCOUNTER — Telehealth: Payer: Managed Care, Other (non HMO) | Admitting: Nurse Practitioner

## 2020-10-07 ENCOUNTER — Encounter: Payer: Self-pay | Admitting: Nurse Practitioner

## 2020-10-07 DIAGNOSIS — J209 Acute bronchitis, unspecified: Secondary | ICD-10-CM | POA: Diagnosis not present

## 2020-10-07 MED ORDER — BENZONATATE 200 MG PO CAPS: 200.0000 mg | ORAL_CAPSULE | Freq: Two times a day (BID) | ORAL | 0 refills | Status: DC | PRN

## 2020-10-07 MED ORDER — FLUTICASONE PROPIONATE 50 MCG/ACT NA SUSP: 2.0000 | Freq: Every day | NASAL | 6 refills | Status: DC

## 2020-10-07 MED ORDER — AZITHROMYCIN 250 MG PO TABS: ORAL_TABLET | ORAL | 0 refills | Status: DC

## 2020-10-07 NOTE — Progress Notes (Signed)
Virtual Visit via Video Note  I connected with Christine Sparks on 10/07/20 at  9:00 AM EST by a video enabled telemedicine application and verified that I am speaking with the correct person using two identifiers. (name and DOB)  Location: Patient: home Provider: [facility]   I discussed the limitations of evaluation and management by telemedicine. Patient can not come to the clinic due to her symptoms and the Clinic being inside the hospital and not allowing anyone with Covid symptoms to enter. The patient expressed understanding and agreed to proceed.  Past Medical History:  Diagnosis Date  . Anemia   . Anxiety   . Arthritis   . Bronchitis, chronic obstructive (HCC)   . Depression   . Diabetes mellitus jan 2012   a1c 5.7, fasting glu 155  . Dyspnea    with exertion  . H/O bronchitis   . Heart murmur   . High cholesterol   . Hypertension   . Menopausal syndrome   . Tobacco abuse    Past Surgical History:  Procedure Laterality Date  . BREAST BIOPSY Left 07/26/2016   complex sclerosing lesion. Coil shape marker  . BREAST LUMPECTOMY Left 08/17/2016   complex sclerosing lesion with focal ADH. Clear margins. The coil shaped biopsy marker remains   . BREAST LUMPECTOMY WITH NEEDLE LOCALIZATION Left 08/17/2016   Procedure: BREAST LUMPECTOMY WITH NEEDLE LOCALIZATION;  Surgeon: Kieth Brightly, MD;  Location: [facility];  Service: General;  Laterality: Left;  . CERVICAL CONE BIOPSY    . DILATION AND CURETTAGE OF UTERUS    . EXCISION / BIOPSY BREAST / NIPPLE / DUCT Left 08/17/2016   complex sclerosing lesion removed  . LIPOMA EXCISION Right    benign tumor right leg  . LYMPH NODE BIOPSY    . TONSILLECTOMY     Current Outpatient Medications on File Prior to Visit  Medication Sig Dispense Refill  . cetirizine (ZYRTEC) 10 MG chewable tablet Chew 10 mg by mouth daily. (Patient not taking: Reported on 03/23/2020)    . citalopram (CELEXA) 40 MG tablet TAKE 0.5 TABLETS (20 MG TOTAL)  BY MOUTH DAILY. (Patient taking differently: Take 40mg s every morning) 30 tablet 2  . escitalopram (LEXAPRO) 5 MG tablet     . INCRUSE ELLIPTA 62.5 MCG/INH AEPB     . LORazepam (ATIVAN) 0.5 MG tablet TAKE 1 TABLET BY MOUTH TWICE A DAY AS NEEDED (Patient taking differently: Take 0.5mg s every morning) 60 tablet 3  . mupirocin ointment (BACTROBAN) 2 % Apply 1 application topically 3 (three) times daily. 22 g 0  . neomycin-polymyxin-hydrocortisone (CORTISPORIN) OTIC solution Place 3 drops into the right ear 3 (three) times daily. (Patient not taking: Reported on 03/23/2020) 10 mL 0  . ramipril (ALTACE) 10 MG capsule TAKE 1 CAPSULE (10 MG TOTAL) BY MOUTH DAILY. 90 capsule 2  . rosuvastatin (CRESTOR) 5 MG tablet Take by mouth.    . VENTOLIN HFA 108 (90 BASE) MCG/ACT inhaler INHALE 2 PUFFS BY MOUTH EVERY 6 HOURS ASNEEDED 18 g 0   No current facility-administered medications on file prior to visit.   No Known Allergies   History of Present Illness: Sx onset 6 days ago and include cough, aching, congestion, sinus congestion. She describes the cough as productive with yellow sputum. Patient denies fever, chills, n/v/d, chest pain or shortness of breath. She reports treating her sx with ibuprofen, albuterol inhaler, increasing fluid intake.    Observations/Objective: Patient reports having had her Covid vaccine and the booster. On  video she appears in NAD, she is alert and oriented. She answers questions appropriately. She did have to cough during the visit and it was a congested cough.   Assessment and Plan: 60 y.o. female with congestion and productive cough that started 6 days ago and has had mild improvement but now sputum is yellow. Will Rx antibiotic, Flonase and cough medication.   Follow Up Instructions:    I discussed the assessment and treatment plan with the patient. The patient was provided an opportunity to ask questions and all were answered. The patient agreed with the plan and  demonstrated an understanding of the instructions.   The patient was advised to call back or seek an in-person evaluation if the symptoms worsen or if the condition fails to improve as anticipated.  I provided 10 minutes of video time during this encounter.   Debroah Baller, NP

## 2020-10-07 NOTE — Patient Instructions (Addendum)
Thank you for allowing me to do a virtual visit with you today. I hope you will be feeling better very soon. Be sure you are drinking plenty of fluids. Take the antibiotic with food. If your symptoms worsen call the Clinic or go to Urgent Care for further evaluation.  Acute Bronchitis, Adult  Acute bronchitis is when air tubes in the lungs (bronchi) suddenly get swollen. The condition can make it hard for you to breathe. In adults, acute bronchitis usually goes away within 2 weeks. A cough caused by bronchitis may last up to 3 weeks. Smoking, allergies, and asthma can make the condition worse. What are the causes? This condition is caused by:  Cold and flu viruses. The most common cause of this condition is the virus that causes the common cold.  Bacteria.  Substances that irritate the lungs, including: ? Smoke from cigarettes and other types of tobacco. ? Dust and pollen. ? Fumes from chemicals, gases, or burned fuel. ? Other materials that pollute indoor or outdoor air.  Close contact with someone who has acute bronchitis. What increases the risk? The following factors may make you more likely to develop this condition:  A weak body's defense system. This is also called the immune system.  Any condition that affects your lungs and breathing, such as asthma. What are the signs or symptoms? Symptoms of this condition include:  A cough.  Coughing up clear, yellow, or green mucus.  Wheezing.  Chest congestion.  Shortness of breath.  A fever.  Body aches.  Chills.  A sore throat. How is this treated? Acute bronchitis may go away over time without treatment. Your doctor may recommend:  Drinking more fluids.  Taking a medicine for a fever or cough.  Using a device that gets medicine into your lungs (inhaler).  Using a vaporizer or a humidifier. These are machines that add water or moisture in the air to help with coughing and poor breathing. Follow these instructions  at home:  Activity  Get a lot of rest.  Avoid places where there are fumes from chemicals.  Return to your normal activities as told by your doctor. Ask your doctor what activities are safe for you. Lifestyle  Drink enough fluids to keep your pee (urine) pale yellow.  Do not drink alcohol.  Do not use any products that contain nicotine or tobacco, such as cigarettes, e-cigarettes, and chewing tobacco. If you need help quitting, ask your doctor. Be aware that: ? Your bronchitis will get worse if you smoke or breathe in other people's smoke (secondhand smoke). ? Your lungs will heal faster if you quit smoking. General instructions  Take over-the-counter and prescription medicines only as told by your doctor.  Use an inhaler, cool mist vaporizer, or humidifier as told by your doctor.  Rinse your mouth often with salt water. To make salt water, dissolve -1 tsp (3-6 g) of salt in 1 cup (237 mL) of warm water.  Keep all follow-up visits as told by your doctor. This is important. How is this prevented? To lower your risk of getting this condition again:  Wash your hands often with soap and water. If soap and water are not available, use hand sanitizer.  Avoid contact with people who have cold symptoms.  Try not to touch your mouth, nose, or eyes with your hands.  Make sure to get the flu shot every year. Contact a doctor if:  Your symptoms do not get better in 2 weeks.  You vomit  more than once or twice.  You have symptoms of loss of fluid from your body (dehydration). These include: ? Dark urine. ? Dry skin or eyes. ? Increased thirst. ? Headaches. ? Confusion. ? Muscle cramps. Get help right away if:  You cough up blood.  You have chest pain.  You have very bad shortness of breath.  You become dehydrated.  You faint or keep feeling like you are going to faint.  You keep vomiting.  You have a very bad headache.  Your fever or chills get worse. These  symptoms may be an emergency. Do not wait to see if the symptoms will go away. Get medical help right away. Call your local emergency services (911 in the [place] Do not drive yourself to the hospital. Summary  Acute bronchitis is when air tubes in the lungs (bronchi) suddenly get swollen. In adults, acute bronchitis usually goes away within 2 weeks.  Take over-the-counter and prescription medicines only as told by your doctor.  Drink enough fluid to keep your pee (urine) pale yellow.  Contact a doctor if your symptoms do not improve after 2 weeks of treatment.  Get help right away if you cough up blood, faint, or have chest pain or shortness of breath. This information is not intended to replace advice given to you by your health care provider. Make sure you discuss any questions you have with your health care provider. Document Revised: 04/12/2019 Document Reviewed: 04/12/2019 Elsevier Patient Education  2020 Elsevier Inc.  Antibiotic Medicine, Adult  Antibiotic medicines treat infections caused by a type of germ called bacteria. They work by killing the bacteria that make you sick. When do I need to take antibiotics? You often need these medicines to treat bacterial infections, such as:  A urinary tract infection (UTI).  Strep throat.  Meningitis. This affects the spinal cord and brain.  A bad lung infection. You may start the medicines while your doctor waits for tests to come back. When the tests come back, your doctor may change or stop your medicine. When are antibiotics not needed? You do not need these medicines for most common illnesses, such as:  A cold.  The flu.  A sore throat. Antibiotics are not always needed for all infections caused by bacteria. Do not ask for these medicines, or take them, when they are not needed. What are the risks of taking antibiotics? Most antibiotics can cause an infection called Clostridioides difficile (C. diff). This causes watery  poop (diarrhea). Let your doctor know right away if:  You have watery poop while taking an antibiotic.  You have watery poop after you stop taking an antibiotic. The illness can happen weeks after you stop the medicine. You also have a risk of getting an infection in the future that antibiotics cannot treat (antibiotic-resistant infection). This type of infection can be dangerous. What else should I know about taking antibiotics?   You need to take the entire prescription. ? Take the medicine for as long as told by your doctor. ? Do not stop taking it even if you start to feel better.  Try not to miss any doses. If you miss a dose, call your doctor.  Birth control pills may not work. If you take birth control pills: ? Keep on taking them. ? Use a second form of birth control, such as a condom. Do this for as long as told by your doctor.  Ask your doctor: ? How long to wait in between doses. ?  If you should take the medicine with food. ? If there is anything you should stay away from while taking the antibiotic, such as:  Food.  Drinks.  Medicines. ? If there are any side effects you should watch for.  Only take the medicines that your doctor told you to take. Do not take medicines that were given to someone else.  Drink a large glass of water with the medicine.  Ask the pharmacist for a tool to measure the medicine, such as: ? A syringe. ? A cup. ? A spoon.  Throw away any extra medicine. Contact a doctor if:  You get worse.  You have new joint pain or muscle aches after starting the medicine.  You have side effects from the medicine, such as: ? Stomach pain. ? Watery poop. ? Feeling sick to your stomach (nausea). Get help right away if:  You have signs of a very bad allergic reaction. If this happens, stop taking the medicine right away. Signs may include: ? Hives. These are raised, itchy, red bumps on the skin. ? Skin rash. ? Trouble  breathing. ? Wheezing. ? Swelling. ? Feeling dizzy. ? Throwing up (vomiting).  Your pee (urine) is dark, or is the color of blood.  Your skin turns yellow.  You bruise easily.  You bleed easily.  You have very bad watery poop and cramps in your belly.  You have a very bad headache. Summary  Antibiotics are often used to treat infections caused by bacteria.  Only take these medicines when needed.  Let your doctor know if you have watery poop while taking an antibiotic.  You need to take the entire prescription. This information is not intended to replace advice given to you by your health care provider. Make sure you discuss any questions you have with your health care provider. Document Revised: 10/26/2018 Document Reviewed: 09/21/2016 Elsevier Patient Education  [place].  Probiotics Probiotics are the good bacteria and yeasts that live in your body and keep your digestive system healthy. Probiotics also help your body's defense system (immune system) and protect your body against the growth of harmful bacteria. Your health care provider may recommend taking a probiotic if you are taking antibiotics or have certain medical conditions, such as:  Diarrhea.  Constipation.  Irritable bowel syndrome.  Lung infections.  Yeast infections.  Acne, eczema, and other skin conditions.  Frequent urinary tract infections. What affects the balance of bacteria in my body? The balance of good bacteria in your body can be affected by:  Antibiotic medicines. These medicines treat infections caused by bacteria. Unfortunately, they may kill the good bacteria in your body as well as the bad bacteria.  Certain medical conditions. Conditions related to an imbalance of bacteria include: ? Stomach and intestine (gastrointestinal) infections. ? Lung infections. ? Skin infections. ? Vaginal infections. ? Inflammatory bowel diseases. ? Stomach ulcers (gastric ulcers). ? Tooth  decay and gum disease (periodontal disease).  Stress.  Poor diet. What type of probiotic is right for me? Probiotics contain different types of bacteria (strains). Strains commonly found in probiotics include:  Lactobacillus.  Saccharomyces.  Bifidobacterium. Specific strains have been shown to be more effective for certain health conditions. Ask your health care provider which strain or strains you should use and how often. Probiotics come in many different forms, strain combinations, and strengths. Some may need to be refrigerated. Always read the label for storage and usage instructions. Certain foods, such as yogurt, contain probiotics. Probiotics can  also be bought as a supplement at a pharmacy, health food store, or grocery store. Talk to your health care provider before starting any supplement. What are the side effects of probiotics? Some people have side effects when taking probiotics. Side effects are usually temporary and may include:  Gas.  Bloating.  Cramping. Serious side effects are rare. Follow these instructions at home:   If you are taking probiotics with antibiotics: ? Wait at least 2 hours between taking your medicine and the probiotic. ? Eat foods high in fiber, such as whole grains, beans, and vegetables. These foods can help good bacteria grow. ? Avoid certain foods as told by your health care provider. Summary  Probiotics are the good bacteria and yeasts that live in your body and keep you and your digestive system healthy.  Certain foods, such as yogurt, contain probiotics.  Probiotics can be taken as supplements. They can be bought at a pharmacy, health food store, or grocery store. They come in many different forms, strain combinations, and strengths.  Be sure to talk with your health care provider before taking a probiotic supplement. This information is not intended to replace advice given to you by your health care provider. Make sure you discuss  any questions you have with your health care provider. Document Revised: 06/08/2018 Document Reviewed: 10/04/2017 Elsevier Patient Education  2020 [employer].

## 2020-11-23 ENCOUNTER — Other Ambulatory Visit: Payer: Managed Care, Other (non HMO)

## 2020-11-27 ENCOUNTER — Other Ambulatory Visit: Payer: Managed Care, Other (non HMO)

## 2020-11-27 ENCOUNTER — Other Ambulatory Visit: Payer: Self-pay

## 2020-11-27 DIAGNOSIS — R5383 Other fatigue: Secondary | ICD-10-CM

## 2020-11-27 DIAGNOSIS — I10 Essential (primary) hypertension: Secondary | ICD-10-CM

## 2020-11-27 DIAGNOSIS — R7303 Prediabetes: Secondary | ICD-10-CM | POA: Diagnosis not present

## 2020-11-27 DIAGNOSIS — E785 Hyperlipidemia, unspecified: Secondary | ICD-10-CM | POA: Diagnosis not present

## 2020-11-27 DIAGNOSIS — E559 Vitamin D deficiency, unspecified: Secondary | ICD-10-CM

## 2020-11-30 LAB — VITAMIN D 25 HYDROXY (VIT D DEFICIENCY, FRACTURES): Vit D, 25-Hydroxy: 18.9 ng/mL — ABNORMAL LOW (ref 30.0–100.0)

## 2020-11-30 LAB — CBC WITH DIFFERENTIAL/PLATELET
Basophils Absolute: 0.1 10*3/uL (ref 0.0–0.2)
Basos: 1 %
EOS (ABSOLUTE): 0.2 10*3/uL (ref 0.0–0.4)
Eos: 3 %
Hematocrit: 41.7 % (ref 34.0–46.6)
Hemoglobin: 14 g/dL (ref 11.1–15.9)
Immature Grans (Abs): 0 10*3/uL (ref 0.0–0.1)
Immature Granulocytes: 0 %
Lymphocytes Absolute: 1.7 10*3/uL (ref 0.7–3.1)
Lymphs: 20 %
MCH: 32.7 pg (ref 26.6–33.0)
MCHC: 33.6 g/dL (ref 31.5–35.7)
MCV: 97 fL (ref 79–97)
Monocytes Absolute: 0.8 10*3/uL (ref 0.1–0.9)
Monocytes: 9 %
Neutrophils Absolute: 5.4 10*3/uL (ref 1.4–7.0)
Neutrophils: 67 %
Platelets: 464 10*3/uL — ABNORMAL HIGH (ref 150–450)
RBC: 4.28 x10E6/uL (ref 3.77–5.28)
RDW: 12.2 % (ref 11.7–15.4)
WBC: 8.1 10*3/uL (ref 3.4–10.8)

## 2020-11-30 LAB — COMPREHENSIVE METABOLIC PANEL
ALT: 23 IU/L (ref 0–32)
AST: 32 IU/L (ref 0–40)
Albumin/Globulin Ratio: 2.4 — ABNORMAL HIGH (ref 1.2–2.2)
Albumin: 4.4 g/dL (ref 3.8–4.8)
Alkaline Phosphatase: 93 IU/L (ref 44–121)
BUN/Creatinine Ratio: 12 (ref 12–28)
BUN: 8 mg/dL (ref 8–27)
Bilirubin Total: 0.2 mg/dL (ref 0.0–1.2)
CO2: 21 mmol/L (ref 20–29)
Calcium: 9.5 mg/dL (ref 8.7–10.3)
Chloride: 98 mmol/L (ref 96–106)
Creatinine, Ser: 0.66 mg/dL (ref 0.57–1.00)
GFR calc Af Amer: 110 mL/min/{1.73_m2} (ref >59)
GFR calc non Af Amer: 96 mL/min/{1.73_m2} (ref >59)
Globulin, Total: 1.8 g/dL (ref 1.5–4.5)
Glucose: 75 mg/dL (ref 65–99)
Potassium: 4.6 mmol/L (ref 3.5–5.2)
Sodium: 136 mmol/L (ref 134–144)
Total Protein: 6.2 g/dL (ref 6.0–8.5)

## 2020-11-30 LAB — LIPID PANEL
Chol/HDL Ratio: 2.3 ratio (ref 0.0–4.4)
Cholesterol, Total: 172 mg/dL (ref 100–199)
HDL: 75 mg/dL (ref >39)
LDL Chol Calc (NIH): 80 mg/dL (ref 0–99)
Triglycerides: 92 mg/dL (ref 0–149)
VLDL Cholesterol Cal: 17 mg/dL (ref 5–40)

## 2020-11-30 LAB — TSH: TSH: 1.14 u[IU]/mL (ref 0.450–4.500)

## 2020-11-30 LAB — HGB A1C W/O EAG: Hgb A1c MFr Bld: 5.1 % (ref 4.8–5.6)

## 2021-09-14 ENCOUNTER — Other Ambulatory Visit: Payer: Self-pay | Admitting: Nurse Practitioner

## 2021-09-14 DIAGNOSIS — Z1231 Encounter for screening mammogram for malignant neoplasm of breast: Secondary | ICD-10-CM

## 2021-12-23 ENCOUNTER — Other Ambulatory Visit: Payer: Self-pay

## 2021-12-23 ENCOUNTER — Ambulatory Visit
Admission: RE | Admit: 2021-12-23 | Discharge: 2021-12-23 | Disposition: A | Discharge status: Home / Self Care | Payer: Managed Care, Other (non HMO) | Source: Ambulatory Visit | Attending: Nurse Practitioner | Admitting: Nurse Practitioner

## 2021-12-23 DIAGNOSIS — Z1231 Encounter for screening mammogram for malignant neoplasm of breast: Secondary | ICD-10-CM | POA: Diagnosis not present

## 2021-12-24 ENCOUNTER — Other Ambulatory Visit: Payer: Self-pay | Admitting: Nurse Practitioner

## 2021-12-24 DIAGNOSIS — N6489 Other specified disorders of breast: Secondary | ICD-10-CM

## 2021-12-24 DIAGNOSIS — R928 Other abnormal and inconclusive findings on diagnostic imaging of breast: Secondary | ICD-10-CM

## 2022-01-12 ENCOUNTER — Other Ambulatory Visit: Payer: Managed Care, Other (non HMO)

## 2022-01-12 ENCOUNTER — Inpatient Hospital Stay: Admission: RE | Admit: 2022-01-12 | Payer: Managed Care, Other (non HMO) | Source: Ambulatory Visit

## 2022-01-28 ENCOUNTER — Ambulatory Visit
Admission: RE | Admit: 2022-01-28 | Discharge: 2022-01-28 | Disposition: A | Discharge status: Home / Self Care | Payer: Managed Care, Other (non HMO) | Source: Ambulatory Visit | Attending: Nurse Practitioner | Admitting: Nurse Practitioner

## 2022-01-28 DIAGNOSIS — N6489 Other specified disorders of breast: Secondary | ICD-10-CM | POA: Insufficient documentation

## 2022-01-28 DIAGNOSIS — R928 Other abnormal and inconclusive findings on diagnostic imaging of breast: Secondary | ICD-10-CM | POA: Insufficient documentation

## 2022-09-15 LAB — LIPID PANEL
Cholesterol: 162 (ref 0–200)
HDL: 85 — AB (ref 35–70)
LDL Cholesterol: 65
Triglycerides: 59 (ref 40–160)

## 2022-09-15 LAB — BASIC METABOLIC PANEL WITH GFR
BUN: 6 (ref 4–21)
CO2: 22 (ref 13–22)
Chloride: 95 — AB (ref 99–108)
Creatinine: 0.7 (ref 0.5–1.1)
Glucose: 133
Potassium: 4.9 meq/L (ref 3.5–5.1)
Sodium: 133 — AB (ref 137–147)

## 2022-09-15 LAB — HEMOGLOBIN A1C: Hemoglobin A1C: 5.3

## 2022-09-15 LAB — CBC: RBC: 4.36 (ref 3.87–5.11)

## 2022-09-15 LAB — HEPATIC FUNCTION PANEL
ALT: 37 U/L — AB (ref 7–35)
AST: 45 — AB (ref 13–35)
Alkaline Phosphatase: 116 (ref 25–125)
Bilirubin, Total: 0.8

## 2022-09-15 LAB — CBC AND DIFFERENTIAL
HCT: 42 (ref 36–46)
Hemoglobin: 14.8 (ref 12.0–16.0)
Platelets: 463 10*3/uL — AB (ref 150–400)
WBC: 6.2

## 2022-09-15 LAB — TSH: TSH: 1.05 (ref 0.41–5.90)

## 2022-09-15 LAB — COMPREHENSIVE METABOLIC PANEL WITH GFR: Calcium: 9.8 (ref 8.7–10.7)

## 2022-12-13 ENCOUNTER — Other Ambulatory Visit: Payer: Self-pay | Admitting: Nurse Practitioner

## 2023-01-04 ENCOUNTER — Other Ambulatory Visit: Payer: Self-pay | Admitting: Nurse Practitioner

## 2023-01-09 ENCOUNTER — Other Ambulatory Visit: Payer: Managed Care, Other (non HMO)

## 2023-01-09 DIAGNOSIS — E119 Type 2 diabetes mellitus without complications: Secondary | ICD-10-CM

## 2023-01-09 DIAGNOSIS — E785 Hyperlipidemia, unspecified: Secondary | ICD-10-CM

## 2023-01-09 DIAGNOSIS — I10 Essential (primary) hypertension: Secondary | ICD-10-CM

## 2023-01-10 ENCOUNTER — Ambulatory Visit: Payer: Managed Care, Other (non HMO) | Admitting: Nurse Practitioner

## 2023-01-10 ENCOUNTER — Encounter: Payer: Self-pay | Admitting: Nurse Practitioner

## 2023-01-10 VITALS — BP 130/70 | HR 106 | Ht 64.0 in | Wt 126.8 lb

## 2023-01-10 DIAGNOSIS — F419 Anxiety disorder, unspecified: Secondary | ICD-10-CM | POA: Diagnosis not present

## 2023-01-10 DIAGNOSIS — K5790 Diverticulosis of intestine, part unspecified, without perforation or abscess without bleeding: Secondary | ICD-10-CM | POA: Diagnosis not present

## 2023-01-10 DIAGNOSIS — J301 Allergic rhinitis due to pollen: Secondary | ICD-10-CM

## 2023-01-10 DIAGNOSIS — E871 Hypo-osmolality and hyponatremia: Secondary | ICD-10-CM | POA: Diagnosis not present

## 2023-01-10 HISTORY — DX: Allergic rhinitis due to pollen: J30.1

## 2023-01-10 LAB — LIPID PANEL
Chol/HDL Ratio: 1.8 ratio (ref 0.0–4.4)
Cholesterol, Total: 161 mg/dL (ref 100–199)
HDL: 90 mg/dL (ref >39)
LDL Chol Calc (NIH): 59 mg/dL (ref 0–99)
Triglycerides: 59 mg/dL (ref 0–149)
VLDL Cholesterol Cal: 12 mg/dL (ref 5–40)

## 2023-01-10 LAB — CMP14+EGFR
ALT: 20 IU/L (ref 0–32)
AST: 20 IU/L (ref 0–40)
Albumin/Globulin Ratio: 2.3 — ABNORMAL HIGH (ref 1.2–2.2)
Albumin: 4.2 g/dL (ref 3.9–4.9)
Alkaline Phosphatase: 95 IU/L (ref 44–121)
BUN/Creatinine Ratio: 8 — ABNORMAL LOW (ref 12–28)
BUN: 5 mg/dL — ABNORMAL LOW (ref 8–27)
Bilirubin Total: 0.6 mg/dL (ref 0.0–1.2)
CO2: 23 mmol/L (ref 20–29)
Calcium: 9.6 mg/dL (ref 8.7–10.3)
Chloride: 94 mmol/L — ABNORMAL LOW (ref 96–106)
Creatinine, Ser: 0.62 mg/dL (ref 0.57–1.00)
Globulin, Total: 1.8 g/dL (ref 1.5–4.5)
Glucose: 128 mg/dL — ABNORMAL HIGH (ref 70–99)
Potassium: 4.8 mmol/L (ref 3.5–5.2)
Sodium: 131 mmol/L — ABNORMAL LOW (ref 134–144)
Total Protein: 6 g/dL (ref 6.0–8.5)
eGFR: 100 mL/min/{1.73_m2} (ref >59)

## 2023-01-10 LAB — CBC WITH DIFFERENTIAL
Basophils Absolute: 0.1 10*3/uL (ref 0.0–0.2)
Basos: 1 %
EOS (ABSOLUTE): 0.1 10*3/uL (ref 0.0–0.4)
Eos: 1 %
Hematocrit: 40.4 % (ref 34.0–46.6)
Hemoglobin: 13.9 g/dL (ref 11.1–15.9)
Immature Grans (Abs): 0 10*3/uL (ref 0.0–0.1)
Immature Granulocytes: 0 %
Lymphocytes Absolute: 1.4 10*3/uL (ref 0.7–3.1)
Lymphs: 19 %
MCH: 33.4 pg — ABNORMAL HIGH (ref 26.6–33.0)
MCHC: 34.4 g/dL (ref 31.5–35.7)
MCV: 97 fL (ref 79–97)
Monocytes Absolute: 0.6 10*3/uL (ref 0.1–0.9)
Monocytes: 9 %
Neutrophils Absolute: 5.1 10*3/uL (ref 1.4–7.0)
Neutrophils: 70 %
RBC: 4.16 x10E6/uL (ref 3.77–5.28)
RDW: 12.3 % (ref 11.7–15.4)
WBC: 7.3 10*3/uL (ref 3.4–10.8)

## 2023-01-10 LAB — TSH: TSH: 1.11 u[IU]/mL (ref 0.450–4.500)

## 2023-01-10 LAB — HEMOGLOBIN A1C
Est. average glucose Bld gHb Est-mCnc: 103 mg/dL
Hgb A1c MFr Bld: 5.2 % (ref 4.8–5.6)

## 2023-01-10 MED ORDER — IPRATROPIUM-ALBUTEROL 0.5-2.5 (3) MG/3ML IN SOLN: 3.0000 mL | Freq: Four times a day (QID) | RESPIRATORY_TRACT | 3 refills | Status: DC | PRN

## 2023-01-10 MED ORDER — NICOTINE 21 MG/24HR TD PT24: 21.0000 mg | MEDICATED_PATCH | TRANSDERMAL | 1 refills | Status: DC

## 2023-01-10 MED ORDER — METHYLPREDNISOLONE 4 MG PO TBPK: ORAL_TABLET | ORAL | 0 refills | Status: DC

## 2023-01-10 MED ORDER — AZITHROMYCIN 500 MG PO TABS: 500.0000 mg | ORAL_TABLET | Freq: Every day | ORAL | 0 refills | Status: DC

## 2023-01-10 NOTE — Progress Notes (Signed)
Established Patient Office Visit  Subjective:  Patient ID: Christine Sparks, female    DOB: 12-27-1960  Age: 62 y.o. MRN: [id number]  Chief Complaint  Patient presents with   Annual Exam    CPE    6 month follow up and we will discuss labs.  Pt having bronchitis symptoms.      No other concerns at this time.   Past Medical History:  Diagnosis Date   Anemia    Anxiety    Arthritis    Bronchitis, chronic obstructive    Depression    Diabetes mellitus jan 2012   a1c 5.7, fasting glu 155   Dyspnea    with exertion   H/O bronchitis    Heart murmur    High cholesterol    Hypertension    Menopausal syndrome    Tobacco abuse     Past Surgical History:  Procedure Laterality Date   BREAST BIOPSY Left 07/26/2016   complex sclerosing lesion. Coil shape marker   BREAST LUMPECTOMY Left 08/17/2016   complex sclerosing lesion with focal ADH. Clear margins. The coil shaped biopsy marker remains    BREAST LUMPECTOMY WITH NEEDLE LOCALIZATION Left 08/17/2016   Procedure: BREAST LUMPECTOMY WITH NEEDLE LOCALIZATION;  Surgeon: Kieth Brightly, MD;  Location: [facility];  Service: General;  Laterality: Left;   CERVICAL CONE BIOPSY     DILATION AND CURETTAGE OF UTERUS     EXCISION / BIOPSY BREAST / NIPPLE / DUCT Left 08/17/2016   complex sclerosing lesion removed   LIPOMA EXCISION Right    benign tumor right leg   LYMPH NODE BIOPSY     TONSILLECTOMY      Social History   Socioeconomic History   Marital status: Divorced    Spouse name: Not on file   Number of children: Not on file   Years of education: Not on file   Highest education level: Not on file  Occupational History   Not on file  Tobacco Use   Smoking status: Every Day    Packs/day: .5    Types: Cigarettes   Smokeless tobacco: Never  Substance and Sexual Activity   Alcohol use: Yes    Comment: occasionally   Drug use: No   Sexual activity: Not Currently    Birth control/protection: Post-menopausal  Other  Topics Concern   Not on file  Social History Narrative   Not on file   Social Determinants of Health   Financial Resource Strain: Not on file  Food Insecurity: Not on file  Transportation Needs: Not on file  Physical Activity: Not on file  Stress: Not on file  Social Connections: Not on file  Intimate Partner Violence: Not on file    Family History  Problem Relation Age of Onset   Cancer Mother        melanoma   Cancer Father        Lung Cancer   Early death Brother    Heart disease Brother    Breast cancer Paternal Aunt 61   Colon cancer Other    Ovarian cancer Neg Hx    Diabetes Neg Hx     No Known Allergies  Review of Systems  Constitutional: Negative.   HENT:  Positive for congestion.   Eyes: Negative.   Respiratory:  Positive for cough, sputum production, shortness of breath and wheezing.   Cardiovascular: Negative.   Gastrointestinal: Negative.   Genitourinary: Negative.   Musculoskeletal: Negative.   Skin: Negative.   Neurological:  Negative.   Endo/Heme/Allergies:  Bruises/bleeds easily.  Psychiatric/Behavioral:  The patient is nervous/anxious and has insomnia.        Objective:   BP 130/70   Pulse (!) 106   Ht 5\' 4"  (1.626 m)   Wt 126 lb 12.8 oz (57.5 kg)   SpO2 94%   BMI 21.77 kg/m   Vitals:   01/10/23 1333  BP: 130/70  Pulse: (!) 106  Height: 5\' 4"  (1.626 m)  Weight: 126 lb 12.8 oz (57.5 kg)  SpO2: 94%  BMI (Calculated): 21.75    Physical Exam Vitals reviewed.  Constitutional:      Appearance: Normal appearance.  HENT:     Head: Normocephalic.     Nose: Congestion present.     Mouth/Throat:     Mouth: Mucous membranes are moist.  Eyes:     Pupils: Pupils are equal, round, and reactive to light.  Cardiovascular:     Rate and Rhythm: Normal rate and regular rhythm.  Pulmonary:     Effort: Pulmonary effort is normal.     Breath sounds: Normal breath sounds.  Abdominal:     General: Bowel sounds are normal.     Palpations:  Abdomen is soft.  Musculoskeletal:        General: Normal range of motion.     Cervical back: Normal range of motion and neck supple.  Skin:    General: Skin is warm and dry.  Neurological:     Mental Status: She is alert and oriented to person, place, and time.  Psychiatric:        Mood and Affect: Mood normal.        Behavior: Behavior normal.      No results found for any visits on 01/10/23.      Assessment & Plan:   Problem List Items Addressed This Visit   None   No follow-ups on file.   Total time spent: 35 minutes  Orson Eva, NP  01/10/2023

## 2023-01-10 NOTE — Addendum Note (Signed)
Addended by: Orson Eva on: 01/10/2023 01:58 PM   Modules accepted: Orders

## 2023-01-10 NOTE — Patient Instructions (Signed)
1) Bronchitis tx,  pt will call back CXR 2) Include gatorade for sodium x 7 days, repeat BMP in 7 days 3) Follow up appt in 6 months, fasting labs prior

## 2023-01-10 NOTE — Addendum Note (Signed)
Addended by: Orson Eva on: 01/10/2023 02:09 PM   Modules accepted: Orders

## 2023-01-12 ENCOUNTER — Telehealth: Payer: Self-pay

## 2023-01-12 NOTE — Telephone Encounter (Signed)
Pt called and left vm regarding rx zithromax causing severe diarrhea & asked if she can cut rx in half to take it? She also asked about refill on rx lorazepam, please advise

## 2023-02-10 ENCOUNTER — Other Ambulatory Visit: Payer: Self-pay | Admitting: Nurse Practitioner

## 2023-04-05 ENCOUNTER — Other Ambulatory Visit: Payer: Self-pay | Admitting: Nurse Practitioner

## 2023-04-05 ENCOUNTER — Telehealth: Payer: Self-pay | Admitting: Nurse Practitioner

## 2023-04-05 MED ORDER — RAMIPRIL 10 MG PO CAPS: 10.0000 mg | ORAL_CAPSULE | Freq: Every day | ORAL | 1 refills | Status: DC

## 2023-04-05 NOTE — Telephone Encounter (Signed)
Patient called needing ramipril sent to [facility].

## 2023-04-05 NOTE — Telephone Encounter (Signed)
Duplicate req

## 2023-04-18 ENCOUNTER — Other Ambulatory Visit: Payer: Self-pay | Admitting: Nurse Practitioner

## 2023-05-10 ENCOUNTER — Other Ambulatory Visit: Payer: Self-pay | Admitting: Nurse Practitioner

## 2023-05-10 DIAGNOSIS — F419 Anxiety disorder, unspecified: Secondary | ICD-10-CM

## 2023-05-11 ENCOUNTER — Other Ambulatory Visit: Payer: Self-pay | Admitting: Nurse Practitioner

## 2023-05-11 MED ORDER — LORAZEPAM 0.5 MG PO TABS
0.5000 mg | ORAL_TABLET | Freq: Two times a day (BID) | ORAL | 0 refills | Status: DC | PRN
Start: 2023-05-11 — End: 2023-05-26

## 2023-05-12 ENCOUNTER — Telehealth: Payer: Self-pay | Admitting: Nurse Practitioner

## 2023-05-12 NOTE — Telephone Encounter (Signed)
Patient left VM questioning why her medicine was sent in for #30 when she usually gets #60 a month. I assume she is referring to her lorazepam as this was the most recently sent in medication. Chelsa is no longer at our practice and Dr. Welton Flakes sent in the #30. Need to call patient and explain this to her.

## 2023-05-25 ENCOUNTER — Other Ambulatory Visit: Payer: Self-pay | Admitting: Internal Medicine

## 2023-05-25 DIAGNOSIS — F419 Anxiety disorder, unspecified: Secondary | ICD-10-CM

## 2023-05-26 ENCOUNTER — Other Ambulatory Visit: Payer: Self-pay

## 2023-05-26 ENCOUNTER — Other Ambulatory Visit: Payer: Self-pay | Admitting: Internal Medicine

## 2023-05-26 DIAGNOSIS — F419 Anxiety disorder, unspecified: Secondary | ICD-10-CM

## 2023-05-30 ENCOUNTER — Ambulatory Visit: Payer: Managed Care, Other (non HMO) | Admitting: Internal Medicine

## 2023-05-31 ENCOUNTER — Ambulatory Visit: Payer: Managed Care, Other (non HMO) | Admitting: Cardiology

## 2023-06-01 ENCOUNTER — Encounter: Payer: Self-pay | Admitting: Internal Medicine

## 2023-06-01 ENCOUNTER — Ambulatory Visit: Payer: Managed Care, Other (non HMO) | Admitting: Internal Medicine

## 2023-06-01 VITALS — BP 132/74 | HR 66 | Ht 64.5 in | Wt 123.8 lb

## 2023-06-01 DIAGNOSIS — J438 Other emphysema: Secondary | ICD-10-CM

## 2023-06-01 DIAGNOSIS — F419 Anxiety disorder, unspecified: Secondary | ICD-10-CM | POA: Diagnosis not present

## 2023-06-01 DIAGNOSIS — F17210 Nicotine dependence, cigarettes, uncomplicated: Secondary | ICD-10-CM | POA: Insufficient documentation

## 2023-06-01 DIAGNOSIS — Z1231 Encounter for screening mammogram for malignant neoplasm of breast: Secondary | ICD-10-CM

## 2023-06-01 DIAGNOSIS — E782 Mixed hyperlipidemia: Secondary | ICD-10-CM | POA: Insufficient documentation

## 2023-06-01 DIAGNOSIS — I10 Essential (primary) hypertension: Secondary | ICD-10-CM | POA: Diagnosis not present

## 2023-06-01 DIAGNOSIS — J301 Allergic rhinitis due to pollen: Secondary | ICD-10-CM

## 2023-06-01 MED ORDER — LORAZEPAM 0.5 MG PO TABS: ORAL_TABLET | ORAL | 0 refills | Status: DC

## 2023-06-01 MED ORDER — CITALOPRAM HYDROBROMIDE 40 MG PO TABS
40.0000 mg | ORAL_TABLET | Freq: Every day | ORAL | 1 refills | Status: DC
Start: 2023-06-01 — End: 2023-06-28

## 2023-06-01 MED ORDER — BUSPIRONE HCL 15 MG PO TABS
15.0000 mg | ORAL_TABLET | Freq: Three times a day (TID) | ORAL | 3 refills | Status: DC
Start: 2023-06-01 — End: 2023-06-22

## 2023-06-01 NOTE — Progress Notes (Signed)
Established Patient Office Visit  Subjective:  Patient ID: Christine Sparks, female    DOB: 08-18-1961  Age: 62 y.o. MRN: [id number]  Chief Complaint  Patient presents with   Follow-up    Discuss medications    Patient comes in for her follow-up and to discuss her medications.  Patient has longstanding history of anxiety and reports that she has been taking lorazepam 0.5 mg twice a day for several years.  She is also on citalopram 40 mg and BuSpar.  But reports that she has to take her lorazepam, understands the habit-forming and addiction potential of this medicine.  Patient agrees to taper it down to only 1 tablet/day and only for severe breakthrough anxiety. She agrees to take her citalopram 40 mg/day regularly and increase her BuSpar to 15 mg twice a day. To schedule her mammogram. She continues to smoke cigarettes. Denies chest pain or palpitations, uses her inhalers regularly.    No other concerns at this time.   Past Medical History:  Diagnosis Date   Anemia    Anxiety    Arthritis    Bronchitis, chronic obstructive    Depression    Diabetes mellitus jan 2012   a1c 5.7, fasting glu 155   Dyspnea    with exertion   H/O bronchitis    Heart murmur    High cholesterol    Hypertension    Menopausal syndrome    Tobacco abuse     Past Surgical History:  Procedure Laterality Date   BREAST BIOPSY Left 07/26/2016   complex sclerosing lesion. Coil shape marker   BREAST LUMPECTOMY Left 08/17/2016   complex sclerosing lesion with focal ADH. Clear margins. The coil shaped biopsy marker remains    BREAST LUMPECTOMY WITH NEEDLE LOCALIZATION Left 08/17/2016   Procedure: BREAST LUMPECTOMY WITH NEEDLE LOCALIZATION;  Surgeon: Kieth Brightly, MD;  Location: [facility] ORS;  Service: General;  Laterality: Left;   CERVICAL CONE BIOPSY     DILATION AND CURETTAGE OF UTERUS     EXCISION / BIOPSY BREAST / NIPPLE / DUCT Left 08/17/2016   complex sclerosing lesion removed   LIPOMA  EXCISION Right    benign tumor right leg   LYMPH NODE BIOPSY     TONSILLECTOMY      Social History   Socioeconomic History   Marital status: Divorced    Spouse name: Not on file   Number of children: Not on file   Years of education: Not on file   Highest education level: Not on file  Occupational History   Not on file  Tobacco Use   Smoking status: Every Day    Current packs/day: 0.50    Types: Cigarettes   Smokeless tobacco: Never  Substance and Sexual Activity   Alcohol use: Yes    Comment: occasionally   Drug use: No   Sexual activity: Not Currently    Birth control/protection: Post-menopausal  Other Topics Concern   Not on file  Social History Narrative   Not on file   Social Determinants of Health   Financial Resource Strain: Not on file  Food Insecurity: Not on file  Transportation Needs: Not on file  Physical Activity: Not on file  Stress: Not on file  Social Connections: Not on file  Intimate Partner Violence: Not on file    Family History  Problem Relation Age of Onset   Cancer Mother        melanoma   Cancer Father  Lung Cancer   Early death Brother    Heart disease Brother    Breast cancer Paternal Aunt 22   Colon cancer Other    Ovarian cancer Neg Hx    Diabetes Neg Hx     No Known Allergies  Review of Systems  Constitutional: Negative.  Negative for chills, fever, malaise/fatigue and weight loss.  HENT: Negative.    Eyes: Negative.   Respiratory: Negative.  Negative for cough and shortness of breath.   Cardiovascular: Negative.  Negative for chest pain, palpitations and leg swelling.  Gastrointestinal: Negative.  Negative for abdominal pain, constipation, diarrhea, heartburn, nausea and vomiting.  Genitourinary: Negative.  Negative for dysuria and flank pain.  Musculoskeletal: Negative.  Negative for joint pain and myalgias.  Skin: Negative.   Neurological: Negative.  Negative for dizziness, sensory change, speech change and  headaches.  Endo/Heme/Allergies: Negative.   Psychiatric/Behavioral:  Negative for depression, substance abuse and suicidal ideas. The patient is nervous/anxious.        Objective:   BP 132/74   Pulse 66   Ht 5' 4.5" (1.638 m)   Wt 123 lb 12.8 oz (56.2 kg)   SpO2 100%   BMI 20.92 kg/m   Vitals:   06/01/23 1106  BP: 132/74  Pulse: 66  Height: 5' 4.5" (1.638 m)  Weight: 123 lb 12.8 oz (56.2 kg)  SpO2: 100%  BMI (Calculated): 20.93    Physical Exam Vitals and nursing note reviewed.  Constitutional:      Appearance: Normal appearance.  HENT:     Head: Normocephalic and atraumatic.     Nose: Nose normal.     Mouth/Throat:     Mouth: Mucous membranes are moist.     Pharynx: Oropharynx is clear.  Eyes:     Conjunctiva/sclera: Conjunctivae normal.     Pupils: Pupils are equal, round, and reactive to light.  Cardiovascular:     Rate and Rhythm: Normal rate and regular rhythm.     Pulses: Normal pulses.     Heart sounds: Normal heart sounds. No murmur heard. Pulmonary:     Effort: Pulmonary effort is normal.     Breath sounds: Normal breath sounds. No wheezing.  Abdominal:     General: Bowel sounds are normal.     Palpations: Abdomen is soft.     Tenderness: There is no abdominal tenderness. There is no right CVA tenderness or left CVA tenderness.  Musculoskeletal:        General: Normal range of motion.     Cervical back: Normal range of motion.     Right lower leg: No edema.     Left lower leg: No edema.  Skin:    General: Skin is warm and dry.  Neurological:     General: No focal deficit present.     Mental Status: She is alert and oriented to person, place, and time.  Psychiatric:        Mood and Affect: Mood normal.        Behavior: Behavior normal.      No results found for any visits on 06/01/23.  No results found for this or any previous visit (from the past 2160 hour(s)).    Assessment & Plan:  Meds refilled. Will monitor closely for lorazepam  use. Schedule mammogram. Will check labs at next visit. Problem List Items Addressed This Visit     COPD (chronic obstructive pulmonary disease) (HCC)   Anxiety - Primary   Relevant Medications   busPIRone (BUSPAR)  15 MG tablet   citalopram (CELEXA) 40 MG tablet   LORazepam (ATIVAN) 0.5 MG tablet   Essential hypertension, benign   Allergic rhinitis due to pollen   Mixed hyperlipidemia   Cigarette nicotine dependence without complication   Other Visit Diagnoses     Breast cancer screening by mammogram       Relevant Orders   MM 3D SCREENING MAMMOGRAM BILATERAL BREAST       Return in about 2 weeks (around 06/15/2023).   Total time spent: 30 minutes  Margaretann Loveless, MD  06/01/2023   This document may have been prepared by [employer] [facility] Voice Recognition software and as such may include unintentional dictation errors.

## 2023-06-13 ENCOUNTER — Telehealth: Payer: Self-pay | Admitting: Internal Medicine

## 2023-06-13 NOTE — Telephone Encounter (Signed)
Patient called and reports dizziness since starting the buspirone. States she is not taking the lorazepam anymore either. She stopped the buspirone. Reports bupropion made her dizzy as well. Would you like to try something else before her appt next week or just wait and discuss next steps at the appt?

## 2023-06-15 NOTE — Telephone Encounter (Signed)
Spoke to patient who informed that she is taking a half pill at this time of a lorazepam and has stop taking the buspirone at this time due to dizziness. Was unable to offer patient an earlier appointment but confirmed patient upcoming appointment on 06/19/23 at 11:30 am and advised patient to continue to take the lorazepam until appointment when medications can be reviewed.

## 2023-06-19 ENCOUNTER — Ambulatory Visit: Payer: Managed Care, Other (non HMO) | Admitting: Internal Medicine

## 2023-06-20 ENCOUNTER — Emergency Department: Payer: Managed Care, Other (non HMO)

## 2023-06-20 ENCOUNTER — Ambulatory Visit: Payer: Managed Care, Other (non HMO) | Admitting: Cardiology

## 2023-06-20 ENCOUNTER — Observation Stay
Admission: EM | Admit: 2023-06-20 | Discharge: 2023-06-22 | Disposition: A | Discharge status: Home / Self Care | Payer: Managed Care, Other (non HMO) | Attending: Internal Medicine | Admitting: Internal Medicine

## 2023-06-20 ENCOUNTER — Other Ambulatory Visit: Payer: Self-pay

## 2023-06-20 DIAGNOSIS — J441 Chronic obstructive pulmonary disease with (acute) exacerbation: Secondary | ICD-10-CM

## 2023-06-20 DIAGNOSIS — F419 Anxiety disorder, unspecified: Secondary | ICD-10-CM | POA: Diagnosis present

## 2023-06-20 DIAGNOSIS — E871 Hypo-osmolality and hyponatremia: Secondary | ICD-10-CM | POA: Diagnosis present

## 2023-06-20 DIAGNOSIS — I10 Essential (primary) hypertension: Secondary | ICD-10-CM | POA: Diagnosis present

## 2023-06-20 DIAGNOSIS — F17210 Nicotine dependence, cigarettes, uncomplicated: Secondary | ICD-10-CM | POA: Diagnosis not present

## 2023-06-20 DIAGNOSIS — E782 Mixed hyperlipidemia: Secondary | ICD-10-CM | POA: Diagnosis not present

## 2023-06-20 DIAGNOSIS — Z79899 Other long term (current) drug therapy: Secondary | ICD-10-CM | POA: Diagnosis not present

## 2023-06-20 DIAGNOSIS — Z1152 Encounter for screening for COVID-19: Secondary | ICD-10-CM | POA: Insufficient documentation

## 2023-06-20 DIAGNOSIS — J449 Chronic obstructive pulmonary disease, unspecified: Secondary | ICD-10-CM | POA: Diagnosis present

## 2023-06-20 DIAGNOSIS — Z23 Encounter for immunization: Secondary | ICD-10-CM | POA: Insufficient documentation

## 2023-06-20 DIAGNOSIS — Z720 Tobacco use: Secondary | ICD-10-CM | POA: Diagnosis present

## 2023-06-20 DIAGNOSIS — R0602 Shortness of breath: Secondary | ICD-10-CM | POA: Diagnosis present

## 2023-06-20 DIAGNOSIS — E119 Type 2 diabetes mellitus without complications: Secondary | ICD-10-CM | POA: Insufficient documentation

## 2023-06-20 HISTORY — DX: Chronic obstructive pulmonary disease with (acute) exacerbation: J44.1

## 2023-06-20 LAB — BASIC METABOLIC PANEL
Anion gap: 12 (ref 5–15)
BUN: <5 mg/dL — ABNORMAL LOW (ref 8–23)
CO2: 21 mmol/L — ABNORMAL LOW (ref 22–32)
Calcium: 9.2 mg/dL (ref 8.9–10.3)
Chloride: 98 mmol/L (ref 98–111)
Creatinine, Ser: 0.59 mg/dL (ref 0.44–1.00)
GFR, Estimated: >60 mL/min (ref >60)
Glucose, Bld: 82 mg/dL (ref 70–99)
Potassium: 4.2 mmol/L (ref 3.5–5.1)
Sodium: 131 mmol/L — ABNORMAL LOW (ref 135–145)

## 2023-06-20 LAB — CBC
HCT: 38.6 % (ref 36.0–46.0)
Hemoglobin: 13.6 g/dL (ref 12.0–15.0)
MCH: 33.6 pg (ref 26.0–34.0)
MCHC: 35.2 g/dL (ref 30.0–36.0)
MCV: 95.3 fL (ref 80.0–100.0)
Platelets: 476 10*3/uL — ABNORMAL HIGH (ref 150–400)
RBC: 4.05 MIL/uL (ref 3.87–5.11)
RDW: 12.4 % (ref 11.5–15.5)
WBC: 7.8 10*3/uL (ref 4.0–10.5)
nRBC: 0 % (ref 0.0–0.2)

## 2023-06-20 LAB — RESP PANEL BY RT-PCR (RSV, FLU A&B, COVID)  RVPGX2
Influenza A by PCR: NEGATIVE
Influenza B by PCR: NEGATIVE
Resp Syncytial Virus by PCR: NEGATIVE
SARS Coronavirus 2 by RT PCR: NEGATIVE

## 2023-06-20 MED ORDER — PREDNISONE 20 MG PO TABS
40.0000 mg | ORAL_TABLET | Freq: Every day | ORAL | Status: AC
  Administered 2023-06-21: 40 mg via ORAL
  Filled 2023-06-20: qty 2

## 2023-06-20 MED ORDER — IPRATROPIUM-ALBUTEROL 0.5-2.5 (3) MG/3ML IN SOLN
3.0000 mL | Freq: Once | RESPIRATORY_TRACT | Status: AC
  Administered 2023-06-20: 3 mL via RESPIRATORY_TRACT
  Filled 2023-06-20: qty 3

## 2023-06-20 MED ORDER — FLUTICASONE FUROATE-VILANTEROL 100-25 MCG/ACT IN AEPB
1.0000 | INHALATION_SPRAY | Freq: Every day | RESPIRATORY_TRACT | Status: DC
  Administered 2023-06-21: 1 via RESPIRATORY_TRACT
  Filled 2023-06-20: qty 28

## 2023-06-20 MED ORDER — CITALOPRAM HYDROBROMIDE 10 MG PO TABS
40.0000 mg | ORAL_TABLET | Freq: Every day | ORAL | Status: DC
  Administered 2023-06-21 – 2023-06-22 (×2): 40 mg via ORAL
  Filled 2023-06-20 (×2): qty 4

## 2023-06-20 MED ORDER — RAMIPRIL 5 MG PO CAPS
10.0000 mg | ORAL_CAPSULE | Freq: Every day | ORAL | Status: DC
  Administered 2023-06-22: 10 mg via ORAL
  Filled 2023-06-20: qty 1
  Filled 2023-06-20 (×2): qty 2
  Filled 2023-06-20: qty 1

## 2023-06-20 MED ORDER — LORAZEPAM 2 MG/ML IJ SOLN: 1.0000 mg | Freq: Four times a day (QID) | INTRAMUSCULAR | Status: DC | PRN

## 2023-06-20 MED ORDER — ONDANSETRON HCL 4 MG/2ML IJ SOLN: 4.0000 mg | Freq: Four times a day (QID) | INTRAMUSCULAR | Status: DC | PRN

## 2023-06-20 MED ORDER — LORAZEPAM 1 MG PO TABS
1.0000 mg | ORAL_TABLET | Freq: Once | ORAL | Status: AC
  Administered 2023-06-20: 1 mg via ORAL
  Filled 2023-06-20: qty 1

## 2023-06-20 MED ORDER — ACETAMINOPHEN 325 MG PO TABS: 650.0000 mg | ORAL_TABLET | Freq: Four times a day (QID) | ORAL | Status: DC | PRN

## 2023-06-20 MED ORDER — SENNOSIDES-DOCUSATE SODIUM 8.6-50 MG PO TABS: 1.0000 | ORAL_TABLET | Freq: Every evening | ORAL | Status: DC | PRN

## 2023-06-20 MED ORDER — LORAZEPAM 0.5 MG PO TABS
0.5000 mg | ORAL_TABLET | Freq: Two times a day (BID) | ORAL | Status: DC
  Administered 2023-06-20 – 2023-06-22 (×4): 0.5 mg via ORAL
  Filled 2023-06-20 (×4): qty 1

## 2023-06-20 MED ORDER — NICOTINE 21 MG/24HR TD PT24: 21.0000 mg | MEDICATED_PATCH | Freq: Every day | TRANSDERMAL | Status: DC | PRN

## 2023-06-20 MED ORDER — BUSPIRONE HCL 10 MG PO TABS
15.0000 mg | ORAL_TABLET | Freq: Three times a day (TID) | ORAL | Status: DC
  Filled 2023-06-20 (×2): qty 2

## 2023-06-20 MED ORDER — ROSUVASTATIN CALCIUM 5 MG PO TABS
5.0000 mg | ORAL_TABLET | Freq: Every day | ORAL | Status: DC
  Administered 2023-06-21 – 2023-06-22 (×2): 5 mg via ORAL
  Filled 2023-06-20 (×2): qty 1

## 2023-06-20 MED ORDER — UMECLIDINIUM BROMIDE 62.5 MCG/ACT IN AEPB
1.0000 | INHALATION_SPRAY | Freq: Every day | RESPIRATORY_TRACT | Status: DC
  Administered 2023-06-21: 1 via RESPIRATORY_TRACT
  Filled 2023-06-20: qty 7

## 2023-06-20 MED ORDER — METHYLPREDNISOLONE SODIUM SUCC 125 MG IJ SOLR
125.0000 mg | Freq: Once | INTRAMUSCULAR | Status: AC
  Administered 2023-06-20: 125 mg via INTRAVENOUS
  Filled 2023-06-20: qty 2

## 2023-06-20 MED ORDER — ALBUTEROL SULFATE (2.5 MG/3ML) 0.083% IN NEBU
2.5000 mg | INHALATION_SOLUTION | Freq: Four times a day (QID) | RESPIRATORY_TRACT | Status: DC
  Administered 2023-06-21: 2.5 mg via RESPIRATORY_TRACT
  Filled 2023-06-20 (×2): qty 3

## 2023-06-20 MED ORDER — INFLUENZA VIRUS VACC SPLIT PF (FLUZONE) 0.5 ML IM SUSY
0.5000 mL | PREFILLED_SYRINGE | INTRAMUSCULAR | Status: AC
  Administered 2023-06-21: 0.5 mL via INTRAMUSCULAR
  Filled 2023-06-20: qty 0.5

## 2023-06-20 MED ORDER — LORAZEPAM 0.5 MG PO TABS: 0.5000 mg | ORAL_TABLET | Freq: Two times a day (BID) | ORAL | Status: DC

## 2023-06-20 MED ORDER — ENOXAPARIN SODIUM 40 MG/0.4ML IJ SOSY
40.0000 mg | PREFILLED_SYRINGE | INTRAMUSCULAR | Status: DC
  Administered 2023-06-20 – 2023-06-21 (×2): 40 mg via SUBCUTANEOUS
  Filled 2023-06-20 (×2): qty 0.4

## 2023-06-20 MED ORDER — MELATONIN 5 MG PO TABS: 5.0000 mg | ORAL_TABLET | Freq: Every evening | ORAL | Status: DC | PRN

## 2023-06-20 MED ORDER — HYDRALAZINE HCL 10 MG PO TABS: 10.0000 mg | ORAL_TABLET | Freq: Four times a day (QID) | ORAL | Status: DC | PRN

## 2023-06-20 MED ORDER — ACETAMINOPHEN 650 MG RE SUPP: 650.0000 mg | Freq: Four times a day (QID) | RECTAL | Status: DC | PRN

## 2023-06-20 MED ORDER — ONDANSETRON HCL 4 MG PO TABS: 4.0000 mg | ORAL_TABLET | Freq: Four times a day (QID) | ORAL | Status: DC | PRN

## 2023-06-20 NOTE — ED Triage Notes (Signed)
Pt comes with c/o sob and coughing. Pt has copd. Pt has wheezing and rhonchi per EMS. Pt given 2 duonebs. Pt on 2L [place] and at 99%. Pt has productive raspy cough.

## 2023-06-20 NOTE — Assessment & Plan Note (Addendum)
Mild Albuterol nebulizer 3 times daily, 1 day ordered Patient is status post Solu-Medrol 125 mg IV one-time dose per EDP I ordered prednisone 40 mg p.o. one-time dose for 9/18

## 2023-06-20 NOTE — Assessment & Plan Note (Addendum)
Home ramipril 10 mg daily resumed Hydralazine 10 mg p.o. every 6 hours as needed for SBP greater than 160, 4 days ordered

## 2023-06-20 NOTE — Plan of Care (Signed)
Problem: Education: Goal: Knowledge of General Education information will improve Description: Including pain rating scale, medication(s)/side effects and non-pharmacologic comfort measures Outcome: Progressing   Problem: Health Behavior/Discharge Planning: Goal: Ability to manage health-related needs will improve Outcome: Progressing   Problem: Clinical Measurements: Goal: Ability to maintain clinical measurements within normal limits will improve Outcome: Progressing Goal: Respiratory complications will improve Outcome: Progressing Goal: Cardiovascular complication will be avoided Outcome: Progressing   Problem: Pain Managment: Goal: General experience of comfort will improve Outcome: Progressing   Problem: Safety: Goal: Ability to remain free from injury will improve Outcome: Progressing

## 2023-06-20 NOTE — Hospital Course (Addendum)
Ms. Christine Sparks is a 62 year old female with history of COPD, anemia, anxiety, depression, hypertension, hyperlipidemia, who presents to the emergency department for chief concerns of shortness of breath and cough for 4 days.  Per ED documentation, patient received 2 treatments of DuoNeb's via EMS.  Vitals in the ED showed temperature of 97.6, Heart rate of 86, blood pressure 138/71, SpO2 97% on room air.  Respiration rate obtained on medicine floor was 15.  Serum sodium is 131, potassium 4.2, chloride 98, bicarb 21, BUN of less than 5, serum creatinine of 0.59, EGFR of 82, WBC 7.8, hemoglobin 13.6, platelets of 476.  ED treatment: DuoNebs x 2, Solu-Medrol 125 mg IV one-time dose, Ativan 1 mg p.o.

## 2023-06-20 NOTE — Assessment & Plan Note (Signed)
-  As needed nicotine patch ordered ?

## 2023-06-20 NOTE — Assessment & Plan Note (Addendum)
Suspect acute withdrawal secondary to change in lorazepam outpatient Resumed prior regimen of lorazepam 0.5 mg BID,  PDMP reviewed I discussed with patient to ensure follow-up with PCP and discuss with her PCP that she will not operate heavy machinery and that she has been on lorazepam for over 20 years.  Prior to 20 years ago, she used to have panic attacks all the time. She will need to either resume her prior lorazepam dosing and or make further changes to her panic attack/anti-anxiety regimen

## 2023-06-20 NOTE — Assessment & Plan Note (Signed)
-  Rosuvastatin 5 mg daily resumed

## 2023-06-20 NOTE — ED Provider Triage Note (Signed)
Emergency Medicine Provider Triage Evaluation Note  Christine Sparks , a 62 y.o. female  was evaluated in triage.  Pt complains of SOB that began 4 days ago. Her chest feels tight and she has had an occasional productive cough.  Review of Systems  Positive: Anxiety, shakiness, SOB, hot flashes, cough Negative: Chest pain  Physical Exam  BP 138/71 (BP Location: Left Arm)   Pulse 86   Temp 97.6 F (36.4 C) (Oral)   Ht 5\' 4"  (1.626 m)   Wt 56.2 kg   SpO2 97%   BMI 21.25 kg/m  Gen:   Awake, no distress   Resp:  Normal effort  MSK:   Moves extremities without difficulty  Other:  Patient is on 2L of O2, she does not normally wear oxygen  Medical Decision Making  Medically screening exam initiated at 12:26 PM.  Appropriate orders placed.  Christine Sparks was informed that the remainder of the evaluation will be completed by another provider, this initial triage assessment does not replace that evaluation, and the importance of remaining in the ED until their evaluation is complete.    Cameron Ali, PA-C 06/20/23 1229

## 2023-06-20 NOTE — ED Provider Notes (Signed)
[facility] Provider Note    Event Date/Time   First MD Initiated Contact with Patient 06/20/23 1514     (approximate)  History   Chief Complaint: Shortness of Breath  HPI  Christine Sparks is a 62 y.o. female with a past medical history of anemia, anxiety, diabetes, COPD, hypertension, presents to the emergency department for shortness of breath.  According to the patient she has COPD has been experiencing worsening shortness of breath over the last several days to the point where she can no longer walk 20 feet without having to rest to breathe.  Patient denies any chest pain at any point.  No leg pain or swelling, no pleuritic pain.  No fever or congestion.  Physical Exam   Triage Vital Signs: ED Triage Vitals  Encounter Vitals Group     BP 06/20/23 1225 138/71     Systolic BP Percentile --      Diastolic BP Percentile --      Pulse Rate 06/20/23 1225 86     Resp --      Temp 06/20/23 1225 97.6 F (36.4 C)     Temp Source 06/20/23 1225 Oral     SpO2 06/20/23 1223 96 %     Weight 06/20/23 1223 123 lb 12.8 oz (56.2 kg)     Height 06/20/23 1223 5\' 4"  (1.626 m)     Head Circumference --      Peak Flow --      Pain Score 06/20/23 1214 0     Pain Loc --      Pain Education --      Exclude from Growth Chart --     Most recent vital signs: Vitals:   06/20/23 1223 06/20/23 1225  BP:  138/71  Pulse:  86  Temp:  97.6 F (36.4 C)  SpO2: 96% 97%    General: Awake, no distress.  CV:  Good peripheral perfusion.  Regular rate and rhythm  Resp:  Mild tachypnea with decreased air movement bilaterally, wheeze auscultated bilaterally. Abd:  No distention.  Soft, nontender.  No rebound or guarding.  ED Results / Procedures / Treatments   RADIOLOGY  I reviewed and interpreted chest x-ray images.  No consolidation on my evaluation. Radiology is read the x-ray is negative   MEDICATIONS ORDERED IN ED: Medications  methylPREDNISolone sodium succinate  (SOLU-MEDROL) 125 mg/2 mL injection 125 mg (has no administration in time range)  LORazepam (ATIVAN) tablet 1 mg (has no administration in time range)  ipratropium-albuterol (DUONEB) 0.5-2.5 (3) MG/3ML nebulizer solution 3 mL (has no administration in time range)  ipratropium-albuterol (DUONEB) 0.5-2.5 (3) MG/3ML nebulizer solution 3 mL (has no administration in time range)     IMPRESSION / MDM / ASSESSMENT AND PLAN / ED COURSE  I reviewed the triage vital signs and the nursing notes.  Patient's presentation is most consistent with acute presentation with potential threat to life or bodily function.  Patient presents to the emergency department for shortness of breath.  History of COPD states shortness of breath has been worsening over the last several days to the point where she can only walk a very short distance without becoming short of breath and needing to rest.  Patient has mild tachypnea in the low 20s with diminished breath sounds bilaterally with expiratory and inspiratory wheeze.  Satting in the upper 80s on room air with no O2 requirement placed on 2 L currently satting in the upper 90s.  Patient is very  anxious we will dose Ativan, we will dose IV Solu-Medrol, DuoNebs and continue to closely monitor.  Patient agreeable to plan of care.  Given the patient's new oxygen requirement likely related to COPD will admit to the hospital service for further workup and treatment.  FINAL CLINICAL IMPRESSION(S) / ED DIAGNOSES   COPD exacerbation Dyspnea Hypoxia   Note:  This document was prepared using Dragon voice recognition software and may include unintentional dictation errors.   Minna Antis, MD 06/20/23 2246

## 2023-06-20 NOTE — H&P (Signed)
History and Physical   Christine Sparks DDU:[id number] DOB: 23-Jun-1961 DOA: 06/20/2023  PCP: Margaretann Loveless, MD  Patient coming from: Home  I have personally briefly reviewed patient's old medical records in [facility] EMR.  Chief Concern: Shortness of breath, anxiety  HPI: Christine Sparks is a 62 year old female with history of COPD, anemia, anxiety, depression, hypertension, hyperlipidemia, who presents to the emergency department for chief concerns of shortness of breath and cough for 4 days.  Per ED documentation, patient received 2 treatments of DuoNeb's via EMS.  Vitals in the ED showed temperature of 97.6, Heart rate of 86, blood pressure 138/71, SpO2 97% on room air.  Respiration rate obtained on medicine floor was 15.  Serum sodium is 131, potassium 4.2, chloride 98, bicarb 21, BUN of less than 5, serum creatinine of 0.59, EGFR of 82, WBC 7.8, hemoglobin 13.6, platelets of 476.  ED treatment: DuoNebs x 2, Solu-Medrol 125 mg IV one-time dose, Ativan 1 mg p.o. --------------------------- At bedside, paitent was able to tell me her name, age, location, current year.  She reports shortness of breath worse with exertion and cough. She reprots the cough is productive to yellow sputum. She denies known sick contacts other than her visit to [facility] to refill her lorazepam.   Over the past 20 years, she has had lorazepam 0.5 mg twice a day.  However her PCP retired and 2 weeks ago she was seen by a new provider and she was started on buspar for about two weeks and was given only 15 tablets of her lorazepam.    She finished the lorazepam has not slept at all since being off the lorazepam.  She has been anxious and tremulous.  She feels like she is about to have a panic attack over the last couple of days.  Social history: She lives at home on her own. She smokes tobacco and states she will quit now. She infrequently drinks etoh, last drink was two ago (drinking of two glasses of  wine). She denies drug use. 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  ROS: Constitutional: no weight change, no fever ENT/Mouth: no sore throat, no rhinorrhea Eyes: no eye pain, no vision changes Cardiovascular: no chest pain, + dyspnea,  no edema, no palpitations Respiratory: + cough, no sputum, no wheezing Gastrointestinal: no nausea, no vomiting, no diarrhea, no constipation Genitourinary: no urinary incontinence, no dysuria, no hematuria Musculoskeletal: no arthralgias, no myalgias Skin: no skin lesions, no pruritus, Neuro: + weakness, no loss of consciousness, no syncope Psych: no anxiety, no depression, + decrease appetite Heme/Lymph: no bruising, no bleeding  ED Course: Discussed with emergency medicine provider, patient requiring hospitalization for chief concerns of COPD exacerbation.  Assessment/Plan  Principal Problem:   COPD exacerbation (HCC) Active Problems:   Anxiety   COPD (chronic obstructive pulmonary disease) (HCC)   Tobacco abuse   Essential hypertension, benign   Hyponatremia   Mixed hyperlipidemia   Assessment and Plan:  * COPD exacerbation (HCC) Mild Albuterol nebulizer 3 times daily, 1 day ordered Patient is status post Solu-Medrol 125 mg IV one-time dose per EDP I ordered prednisone 40 mg p.o. one-time dose for 9/18  Anxiety Suspect acute withdrawal secondary to change in lorazepam outpatient Resumed prior regimen of lorazepam 0.5 mg BID,  PDMP reviewed I discussed with patient to ensure follow-up with PCP and discuss with her PCP that she will not operate heavy machinery and that she has been on lorazepam  for over 20 years.  Prior to 20 years ago, she used to have panic attacks all the time. She will need to either resume her prior lorazepam dosing and or make further changes to her panic attack/anti-anxiety regimen  Mixed hyperlipidemia Rosuvastatin 5 mg daily resumed  Essential hypertension, benign Home ramipril  10 mg daily resumed Hydralazine 10 mg p.o. every 6 hours as needed for SBP greater than 160, 4 days ordered  Tobacco abuse As needed nicotine patch ordered  Chart reviewed.   DVT prophylaxis: Enoxaparin Code Status: Full code Diet: Heart healthy Family Communication: No, she states that she updated her family already Disposition Plan: Pending clinical course Consults called: No Admission status: Telemetry medical, observation anticipate discharge on 9/18  Past Medical History:  Diagnosis Date   Anemia    Anxiety    Arthritis    Bronchitis, chronic obstructive    Depression    Diabetes mellitus jan 2012   a1c 5.7, fasting glu 155   Dyspnea    with exertion   H/O bronchitis    Heart murmur    High cholesterol    Hypertension    Menopausal syndrome    Tobacco abuse    Past Surgical History:  Procedure Laterality Date   BREAST BIOPSY Left 07/26/2016   complex sclerosing lesion. Coil shape marker   BREAST LUMPECTOMY Left 08/17/2016   complex sclerosing lesion with focal ADH. Clear margins. The coil shaped biopsy marker remains    BREAST LUMPECTOMY WITH NEEDLE LOCALIZATION Left 08/17/2016   Procedure: BREAST LUMPECTOMY WITH NEEDLE LOCALIZATION;  Surgeon: Kieth Brightly, MD;  Location: [facility] ORS;  Service: General;  Laterality: Left;   CERVICAL CONE BIOPSY     DILATION AND CURETTAGE OF UTERUS     EXCISION / BIOPSY BREAST / NIPPLE / DUCT Left 08/17/2016   complex sclerosing lesion removed   LIPOMA EXCISION Right    benign tumor right leg   LYMPH NODE BIOPSY     TONSILLECTOMY     Social History:  reports that she has been smoking cigarettes. She has never used smokeless tobacco. She reports current alcohol use. She reports that she does not use drugs.  No Known Allergies Family History  Problem Relation Age of Onset   Cancer Mother        melanoma   Cancer Father        Lung Cancer   Early death Brother    Heart disease Brother    Breast cancer Paternal Aunt  61   Colon cancer Other    Ovarian cancer Neg Hx    Diabetes Neg Hx    Family history: Family history reviewed and not pertinent  Prior to Admission medications   Medication Sig Start Date End Date Taking? Authorizing Provider  albuterol (VENTOLIN HFA) 108 (90 Base) MCG/ACT inhaler Inhale 2 puffs into the lungs every 6 (six) hours as needed for shortness of breath. 06/14/23  Yes [provider]  busPIRone (BUSPAR) 15 MG tablet Take 1 tablet (15 mg total) by mouth 3 (three) times daily. 06/01/23  Yes Margaretann Loveless, MD  citalopram (CELEXA) 40 MG tablet Take 1 tablet (40 mg total) by mouth daily. 06/01/23  Yes Margaretann Loveless, MD  Fluticasone-Umeclidin-Vilant (TRELEGY ELLIPTA) 100-62.5-25 MCG/ACT AEPB USE ONE INHALATION INTO THE LUNGS ONCE A DAY RINSE MOUTH AFTER EACH USE 02/10/23  Yes Boswell, Chelsa, NP  ipratropium-albuterol (DUONEB) 0.5-2.5 (3) MG/3ML SOLN Take 3 mLs by nebulization every 6 (six) hours as needed. 01/10/23  Yes Orson Eva, NP  ramipril (ALTACE) 10 MG capsule Take 1 capsule (10 mg total) by mouth daily. 04/05/23  Yes Miki Kins, FNP  rosuvastatin (CRESTOR) 5 MG tablet Take by mouth.   Yes [provider]  cetirizine (ZYRTEC) 10 MG chewable tablet Chew 10 mg by mouth daily. Patient not taking: Reported on 03/23/2020    [provider]  fluticasone (FLONASE) 50 MCG/ACT nasal spray Place 2 sprays into both nostrils daily. Patient not taking: Reported on 06/01/2023 10/07/20   Janne Napoleon, NP  INCRUSE ELLIPTA 62.5 MCG/INH AEPB  03/29/19   [provider]  LORazepam (ATIVAN) 0.5 MG tablet Once a day PRN only - Patient not taking: Reported on 06/20/2023 06/01/23   Margaretann Loveless, MD   Physical Exam: Vitals:   06/20/23 1223 06/20/23 1225 06/20/23 1640 06/20/23 1700  BP:  138/71  (!) 149/77  Pulse:  86  84  Resp:    15  Temp:  97.6 F (36.4 C) 97.7 F (36.5 C) 98 F (36.7 C)  TempSrc:  Oral Oral   SpO2: 96% 97%  98%  Weight:      Height:        Constitutional: appears age-appropriate, frail, NAD, calm Eyes: PERRL, lids and conjunctivae normal ENMT: Mucous membranes are moist. Posterior pharynx clear of any exudate or lesions. Age-appropriate dentition. Hearing appropriate Neck: normal, supple, no masses, no thyromegaly Respiratory: clear to auscultation bilaterally, no wheezing, no crackles. Normal respiratory effort. No accessory muscle use.  Cardiovascular: Regular rate and rhythm, no murmurs / rubs / gallops. No extremity edema. 2+ pedal pulses. No carotid bruits.  Abdomen: no tenderness, no masses palpated, no hepatosplenomegaly. Bowel sounds positive.  Musculoskeletal: no clubbing / cyanosis. No joint deformity upper and lower extremities. Good ROM, no contractures, no atrophy. Normal muscle tone.  Skin: no rashes, lesions, ulcers. No induration Neurologic: Sensation intact. Strength 5/5 in all 4.  Psychiatric: Normal judgment and insight. Alert and oriented x 3. Normal mood.   EKG: independently reviewed, showing sinus rhythm with rate of 82, QTc 474  Chest x-ray on Admission: I personally reviewed and I agree with radiologist reading as below.  DG Chest 2 View  Result Date: 06/20/2023 CLINICAL DATA:  Shortness of breath.  Cough. EXAM: CHEST - 2 VIEW COMPARISON:  08/09/2012. FINDINGS: Bilateral lung fields are clear. Bilateral costophrenic angles are clear. Normal cardio-mediastinal silhouette. No acute osseous abnormalities. The soft tissues are within normal limits. IMPRESSION: 1. No active cardiopulmonary disease. Electronically Signed   By: Jules Schick M.D.   On: 06/20/2023 14:39    Labs on Admission: I have personally reviewed following labs CBC: Recent Labs  Lab 06/20/23 1220  WBC 7.8  HGB 13.6  HCT 38.6  MCV 95.3  PLT 476*   Basic Metabolic Panel: Recent Labs  Lab 06/20/23 1220  NA 131*  K 4.2  CL 98  CO2 21*  GLUCOSE 82  BUN <5*  CREATININE 0.59  CALCIUM 9.2   GFR: Estimated Creatinine  Clearance: 61.3 mL/min (by C-G formula based on SCr of 0.59 mg/dL).  This document was prepared using Dragon Voice Recognition software and may include unintentional dictation errors.  Dr. Sedalia Muta [facility]  If 7PM-7AM, please contact overnight-coverage provider If 7AM-7PM, please contact day attending provider [email or web address]  06/20/2023, 7:13 PM

## 2023-06-21 ENCOUNTER — Telehealth: Payer: Self-pay | Admitting: Internal Medicine

## 2023-06-21 DIAGNOSIS — J441 Chronic obstructive pulmonary disease with (acute) exacerbation: Secondary | ICD-10-CM | POA: Diagnosis not present

## 2023-06-21 MED ORDER — IPRATROPIUM-ALBUTEROL 0.5-2.5 (3) MG/3ML IN SOLN
3.0000 mL | Freq: Four times a day (QID) | RESPIRATORY_TRACT | Status: DC
  Administered 2023-06-21 – 2023-06-22 (×3): 3 mL via RESPIRATORY_TRACT
  Filled 2023-06-21 (×4): qty 3

## 2023-06-21 MED ORDER — ALBUTEROL SULFATE (2.5 MG/3ML) 0.083% IN NEBU: 2.5000 mg | INHALATION_SOLUTION | Freq: Three times a day (TID) | RESPIRATORY_TRACT | Status: DC

## 2023-06-21 MED ORDER — ARFORMOTEROL TARTRATE 15 MCG/2ML IN NEBU
15.0000 ug | INHALATION_SOLUTION | Freq: Two times a day (BID) | RESPIRATORY_TRACT | Status: DC
  Administered 2023-06-21 – 2023-06-22 (×3): 15 ug via RESPIRATORY_TRACT
  Filled 2023-06-21 (×4): qty 2

## 2023-06-21 MED ORDER — ALBUTEROL SULFATE (2.5 MG/3ML) 0.083% IN NEBU
2.5000 mg | INHALATION_SOLUTION | RESPIRATORY_TRACT | Status: DC | PRN
  Administered 2023-06-22: 2.5 mg via RESPIRATORY_TRACT
  Filled 2023-06-21: qty 3

## 2023-06-21 MED ORDER — METHYLPREDNISOLONE SODIUM SUCC 125 MG IJ SOLR
60.0000 mg | Freq: Every day | INTRAMUSCULAR | Status: DC
  Administered 2023-06-21 – 2023-06-22 (×2): 60 mg via INTRAVENOUS
  Filled 2023-06-21 (×2): qty 2

## 2023-06-21 MED ORDER — BUDESONIDE 0.25 MG/2ML IN SUSP
0.2500 mg | Freq: Two times a day (BID) | RESPIRATORY_TRACT | Status: DC
  Administered 2023-06-21 – 2023-06-22 (×3): 0.25 mg via RESPIRATORY_TRACT
  Filled 2023-06-21 (×3): qty 2

## 2023-06-21 NOTE — TOC Progression Note (Signed)
Transition of Care [facility]) - Progression Note    Patient Details  Name: Christine Sparks MRN: [id number] Date of Birth: December 08, 1960  Transition of Care [facility]) CM/SW Contact  Marlowe Sax, RN Phone Number: 06/21/2023, 1:56 PM  Clinical Narrative:    Met with the patient in the room, she stated that she lives alone and is independent She drives but is interested in having information from [employer], I added the transportation information and resources to the AVS to print at DC She stated that she has no additional needs   Expected Discharge Plan: Home/Self Care Barriers to Discharge: Continued Medical Work up  Expected Discharge Plan and Services In-house Referral: NA Discharge Planning Services: CM Consult   Living arrangements for the past 2 months: Single Family Home                   DME Agency: NA       HH Arranged: NA           Social Determinants of Health (SDOH) Interventions SDOH Screenings   Food Insecurity: No Food Insecurity (06/20/2023)  Housing: Low Risk  (06/20/2023)  Transportation Needs: No Transportation Needs (06/20/2023)  Utilities: Not At Risk (06/20/2023)  Tobacco Use: High Risk (06/20/2023)    Readmission Risk Interventions     No data to display

## 2023-06-21 NOTE — Progress Notes (Signed)
PROGRESS NOTE    Christine Sparks  ION:[id number] DOB: 05-02-1961 DOA: 06/20/2023 PCP: Margaretann Loveless, MD    Brief Narrative:  62 year old female with history of COPD, anemia, anxiety, depression, hypertension, hyperlipidemia, who presents to the emergency department for chief concerns of shortness of breath and cough for 4 days.    She reports shortness of breath worse with exertion and cough. She reprots the cough is productive to yellow sputum. She denies known sick contacts other than her visit to [facility] to refill her lorazepam.    Over the past 20 years, she has had lorazepam 0.5 mg twice a day.  However her PCP retired and 2 weeks ago she was seen by a new provider and she was started on buspar for about two weeks and was given only 15 tablets of her lorazepam.     She finished the lorazepam has not slept at all since being off the lorazepam.  She has been anxious and tremulous.  She feels like she is about to have a panic attack over the last couple of days.   Assessment & Plan:   Principal Problem:   COPD exacerbation (HCC) Active Problems:   Anxiety   COPD (chronic obstructive pulmonary disease) (HCC)   Tobacco abuse   Essential hypertension, benign   Hyponatremia   Mixed hyperlipidemia  * COPD exacerbation (HCC) Unclear trigger.  Suspect triggered by severe anxiety in the setting of not having her chronic lorazepam. Plan: Solu-Medrol 60 mg IV daily Scheduled DuoNebs Twice daily Brovana Twice daily Pulmicort Wean oxygen as tolerated Anticipate discharge 9/19    Generalized anxiety disorder Patient was recently seen by a new primary care physician.  Patient has been on lorazepam 0.5 mg twice daily x 20 years.  Recently weaned off of lorazepam and started on BuSpar.  Patient states that she did not tolerate BuSpar and had severe anxiety since finishing the remainder of her lorazepam prescription. Plan: Lorazepam restarted.  0.5 mg twice daily Patient does  not tolerate BuSpar, will discontinue Patient will need follow-up appointment with PCP to discuss need for lorazepam.  Patient has been on this medication chronically for 20 years and I suspect she triggered a COPD exacerbation in the setting of benzodiazepine withdrawal  Mixed hyperlipidemia Rosuvastatin 5 mg daily resumed   Essential hypertension, benign Home ramipril 10 mg daily resumed IV hydralazine as needed   Tobacco abuse As needed nicotine patch ordered    DVT prophylaxis: Lovenox Code Status: Full Family Communication: None Disposition Plan: Status is: Observation The patient will require care spanning > 2 midnights and should be moved to inpatient because: COPD exacerbation on scheduled nebulizers and intravenous steroid.  Anticipate discharge 9/19.   Level of care: Telemetry Medical  Consultants:  None  Procedures:  None  Antimicrobials: None   Subjective: Seen and examined.  Resting in bed.  Reports anxiety symptoms much improved.  Still having some shortness of breath and wheezing.  Objective: Vitals:   06/20/23 1700 06/21/23 0003 06/21/23 0909 06/21/23 1204  BP: (!) 149/77 129/72 (!) 142/65   Pulse: 84 88 77   Resp: 15 18 18    Temp: 98 F (36.7 C) 98.6 F (37 C) 98 F (36.7 C)   TempSrc:      SpO2: 98% 97% 98% 95%  Weight:      Height:       No intake or output data in the 24 hours ending 06/21/23 1405 Filed Weights   06/20/23 1223  Weight:  56.2 kg    Examination:  General exam: Appears calm and comfortable  Respiratory system: Normal respiratory rate.  Air entry equal.  End expiratory wheeze.  2 L Cardiovascular system: S1-S2, RRR, no murmurs, no pedal edema Gastrointestinal system: Soft, NT/ND, normal bowel sounds Central nervous system: Alert and oriented. No focal neurological deficits. Extremities: Symmetric 5 x 5 power. Skin: No rashes, lesions or ulcers Psychiatry: Judgement and insight appear normal. Mood & affect appropriate.      Data Reviewed: I have personally reviewed following labs and imaging studies  CBC: Recent Labs  Lab 06/20/23 1220 06/21/23 0315  WBC 7.8 4.5  HGB 13.6 12.4  HCT 38.6 36.3  MCV 95.3 95.5  PLT 476* 431*   Basic Metabolic Panel: Recent Labs  Lab 06/20/23 1220 06/21/23 0315  NA 131* 129*  K 4.2 4.7  CL 98 95*  CO2 21* 24  GLUCOSE 82 136*  BUN <5* 9  CREATININE 0.59 0.54  CALCIUM 9.2 9.0   GFR: Estimated Creatinine Clearance: 61.3 mL/min (by C-G formula based on SCr of 0.54 mg/dL). Liver Function Tests: No results for input(s): "AST", "ALT", "ALKPHOS", "BILITOT", "PROT", "ALBUMIN" in the last 168 hours. No results for input(s): "LIPASE", "AMYLASE" in the last 168 hours. No results for input(s): "AMMONIA" in the last 168 hours. Coagulation Profile: No results for input(s): "INR", "PROTIME" in the last 168 hours. Cardiac Enzymes: No results for input(s): "CKTOTAL", "CKMB", "CKMBINDEX", "TROPONINI" in the last 168 hours. BNP (last 3 results) No results for input(s): "PROBNP" in the last 8760 hours. HbA1C: No results for input(s): "HGBA1C" in the last 72 hours. CBG: No results for input(s): "GLUCAP" in the last 168 hours. Lipid Profile: No results for input(s): "CHOL", "HDL", "LDLCALC", "TRIG", "CHOLHDL", "LDLDIRECT" in the last 72 hours. Thyroid Function Tests: No results for input(s): "TSH", "T4TOTAL", "FREET4", "T3FREE", "THYROIDAB" in the last 72 hours. Anemia Panel: No results for input(s): "VITAMINB12", "FOLATE", "FERRITIN", "TIBC", "IRON", "RETICCTPCT" in the last 72 hours. Sepsis Labs: No results for input(s): "PROCALCITON", "LATICACIDVEN" in the last 168 hours.  Recent Results (from the past 240 hour(s))  Resp panel by RT-PCR (RSV, Flu A&B, Covid) Anterior Nasal Swab     Status: None   Collection Time: 06/20/23  3:44 PM   Specimen: Anterior Nasal Swab  Result Value Ref Range Status   SARS Coronavirus 2 by RT PCR NEGATIVE NEGATIVE Final    Comment:  (NOTE) SARS-CoV-2 target nucleic acids are NOT DETECTED.  The SARS-CoV-2 RNA is generally detectable in upper respiratory specimens during the acute phase of infection. The lowest concentration of SARS-CoV-2 viral copies this assay can detect is 138 copies/mL. A negative result does not preclude SARS-Cov-2 infection and should not be used as the sole basis for treatment or other patient management decisions. A negative result may occur with  improper specimen collection/handling, submission of specimen other than nasopharyngeal swab, presence of viral mutation(s) within the areas targeted by this assay, and inadequate number of viral copies(<138 copies/mL). A negative result must be combined with clinical observations, patient history, and epidemiological information. The expected result is Negative.  Fact Sheet for Patients:  BloggerCourse.com  Fact Sheet for Healthcare Providers:  SeriousBroker.it  This test is no t yet approved or cleared by the [place] FDA and  has been authorized for detection and/or diagnosis of SARS-CoV-2 by FDA under an Emergency Use Authorization (EUA). This EUA will remain  in effect (meaning this test can be used) for the duration of the COVID-19  declaration under Section 564(b)(1) of the Act, 21 U.S.C.section 360bbb-3(b)(1), unless the authorization is terminated  or revoked sooner.       Influenza A by PCR NEGATIVE NEGATIVE Final   Influenza B by PCR NEGATIVE NEGATIVE Final    Comment: (NOTE) The Xpert Xpress SARS-CoV-2/FLU/RSV plus assay is intended as an aid in the diagnosis of influenza from Nasopharyngeal swab specimens and should not be used as a sole basis for treatment. Nasal washings and aspirates are unacceptable for Xpert Xpress SARS-CoV-2/FLU/RSV testing.  Fact Sheet for Patients: BloggerCourse.com  Fact Sheet for Healthcare  Providers: SeriousBroker.it  This test is not yet approved or cleared by the [place] FDA and has been authorized for detection and/or diagnosis of SARS-CoV-2 by FDA under an Emergency Use Authorization (EUA). This EUA will remain in effect (meaning this test can be used) for the duration of the COVID-19 declaration under Section 564(b)(1) of the Act, 21 U.S.C. section 360bbb-3(b)(1), unless the authorization is terminated or revoked.     Resp Syncytial Virus by PCR NEGATIVE NEGATIVE Final    Comment: (NOTE) Fact Sheet for Patients: BloggerCourse.com  Fact Sheet for Healthcare Providers: SeriousBroker.it  This test is not yet approved or cleared by the [place] FDA and has been authorized for detection and/or diagnosis of SARS-CoV-2 by FDA under an Emergency Use Authorization (EUA). This EUA will remain in effect (meaning this test can be used) for the duration of the COVID-19 declaration under Section 564(b)(1) of the Act, 21 U.S.C. section 360bbb-3(b)(1), unless the authorization is terminated or revoked.  Performed at [facility], [address] [place], [place] [address]          Radiology Studies: DG Chest 2 View  Result Date: 06/20/2023 CLINICAL DATA:  Shortness of breath.  Cough. EXAM: CHEST - 2 VIEW COMPARISON:  08/09/2012. FINDINGS: Bilateral lung fields are clear. Bilateral costophrenic angles are clear. Normal cardio-mediastinal silhouette. No acute osseous abnormalities. The soft tissues are within normal limits. IMPRESSION: 1. No active cardiopulmonary disease. Electronically Signed   By: Jules Schick M.D.   On: 06/20/2023 14:39        Scheduled Meds:  arformoterol  15 mcg Nebulization BID   budesonide (PULMICORT) nebulizer solution  0.25 mg Nebulization BID   citalopram  40 mg Oral Daily   enoxaparin (LOVENOX) injection  40 mg Subcutaneous Q24H    ipratropium-albuterol  3 mL Nebulization Q6H   LORazepam  0.5 mg Oral BID   methylPREDNISolone (SOLU-MEDROL) injection  60 mg Intravenous Daily   ramipril  10 mg Oral Daily   rosuvastatin  5 mg Oral Daily   Continuous Infusions:   LOS: 0 days      Tresa Moore, MD [facility]   If 7PM-7AM, please contact night-coverage  06/21/2023, 2:05 PM

## 2023-06-21 NOTE — Care Management Obs Status (Signed)
MEDICARE OBSERVATION STATUS NOTIFICATION   Patient Details  Name: Christine Sparks MRN: [id number] Date of Birth: 09-27-1961   Medicare Observation Status Notification Given:  Yes    Marlowe Sax, RN 06/21/2023, 1:40 PM

## 2023-06-21 NOTE — Telephone Encounter (Signed)
Patient left VM that she is currently admitted at the hospital for shaking/COPD attack. She states it was triggered from not having her lorazepam. She states the doctors at the hospital told her that she needs to continue lorazepam for her anxiety since she has been on it for 20 years. It looks as though the hospital has restarted her on lorazepam. Please advise on if you will continue to write her lorazepam or not and if she needs to schedule an appt to discuss.

## 2023-06-21 NOTE — Plan of Care (Signed)

## 2023-06-21 NOTE — Discharge Instructions (Signed)
Transportation Resources  Agency Name: [facility] Agency Address: [address] [place], [place] [address] Phone: [phone] Email: [email or web address] Website: [email or web address] Service(s) Offered: Housing services, self-sufficiency, congregate meal program, weatherization program, Field seismologist program, emergency food assistance,  housing counseling, home ownership program, wheels-towork program.  Agency Name: [facility] [employer] [phone]) Address: [address] [place], [place] [address] Phone: [phone] Website: [email or web address] Service(s) Offered: Transportation for [employer], subscription and demand response; Dial-a-Ride for citizens 10 years of age or older.  Agency Name: [facility] Address: [address] [place], [place] [address] Phone: [phone] Service(s) Offered: Child support services; child welfare services; food stamps; Medicaid; work first family assistance; and aid with fuel,  rent, food and medicine, transportation assistance.  Agency Name: [employer] (DAV) Transportation  Network Phone: [phone] Service(s) Offered: Transports veterans to the [facility] medical center. Call  forty-eight hours in advance and leave the name, telephone  number, date, and time of appointment. Veteran will be  contacted by the driver the day before the appointment to  arrange a pick up point   Transportation Resources  Agency Name: [facility] Agency Address: [address] [place], [place] [address] Phone: [phone] Email: [email or web address] Website: [email or web address] Service(s) Offered: Housing services, self-sufficiency, congregate meal program, weatherization program, Field seismologist program, emergency food assistance,  housing counseling, home ownership program, wheels-towork  program.  Agency Name: [facility] [employer] [phone]) Address: [address], [place], [place] [address] Phone: [phone] Website: [email or web address] Service(s) Offered: Transportation for [employer], subscription and demand response; Dial-a-Ride for citizens 40 years of age or older.  Agency Name: Department of Social Services Address: [address] [place], [place] [address] Phone: [phone] Service(s) Offered: Child support services; child welfare services; food stamps; Medicaid; work first family assistance; and aid with fuel,  rent, food and medicine, transportation assistance.  Agency Name: [employer] (DAV) Transportation  Network Phone: [phone] Service(s) Offered: Transports veterans to the [facility] medical center. Call  forty-eight hours in advance and leave the name, telephone  number, date, and time of appointment. Veteran will be  contacted by the driver the day before the appointment to  arrange a pick up point    [employer] currently provides door to door services. ACTA connects with PART daily for services to [facility]. ACTA also performs contract services to [employer] operates 27 vehicles, all but 3 mini-vans are equipped with lifts for special needs as well as the general public. [employer] drivers are each CDL certified and trained in First Aid and CPR. ACT[employer] was established in 2002 by [employer]. An independent Industrial/product designer. ACTA operates via Cytogeneticist with required Research scientist (physical sciences) from [place]. [employer] provides over 80,000 passenger trips each year, including [facility] and [employer] sites.  Call at least by 11 AM one business day prior to needing transportation  [employer].                      [place], [place] [address]     Office  Hours: Monday-Friday  8 AM - 5 PM

## 2023-06-22 DIAGNOSIS — J441 Chronic obstructive pulmonary disease with (acute) exacerbation: Secondary | ICD-10-CM | POA: Diagnosis not present

## 2023-06-22 MED ORDER — PREDNISONE 10 MG PO TABS: 40.0000 mg | ORAL_TABLET | Freq: Every day | ORAL | 0 refills | Status: AC

## 2023-06-22 MED ORDER — GUAIFENESIN ER 600 MG PO TB12: 600.0000 mg | ORAL_TABLET | Freq: Two times a day (BID) | ORAL | Status: DC

## 2023-06-22 MED ORDER — LORAZEPAM 0.5 MG PO TABS: 0.5000 mg | ORAL_TABLET | Freq: Two times a day (BID) | ORAL | 1 refills | Status: AC

## 2023-06-22 MED ORDER — GUAIFENESIN ER 600 MG PO TB12: 600.0000 mg | ORAL_TABLET | Freq: Two times a day (BID) | ORAL | 0 refills | Status: AC

## 2023-06-22 NOTE — Progress Notes (Signed)
SATURATION QUALIFICATIONS: (This note is used to comply with regulatory documentation for home oxygen)  Patient Saturations on Room Air at Rest = 98%  Patient Saturations on Room Air while Ambulating = 94%  Patient Saturations on 0 Liters of oxygen while Ambulating = 94%  Please briefly explain why patient needs home oxygen: Pt ambulated in hall and her oxygen saturation dropped from 98 to 94. It fluctuated between mid to high 90's and never dropped below 90. She did a lap around the nurses station and MD was notified.

## 2023-06-22 NOTE — Telephone Encounter (Signed)
Patient called and left another VM this morning about she is still in the hospital from her COPD attack and benzo withdrawal. States she needs to find a PCP that will Rx her the Xanax is what she kept saying but she was taking Ativan previously. She mentioned a Dr. Valentina Lucks and that she was supposed to see that doctor yesterday but was in the hospital. Asked if Dr. Valentina Lucks was going to prescribe this for her. Dr. Valentina Lucks is not at our practice so I will call patient and see if she meant Dr. Welton Flakes.

## 2023-06-22 NOTE — Plan of Care (Signed)

## 2023-06-22 NOTE — Discharge Summary (Signed)
Physician Discharge Summary  Christine Sparks UUV:[id number] DOB: [birth year] DOA: 06/20/2023  PCP: Margaretann Loveless, MD  Admit date: 06/20/2023 Discharge date: 06/22/2023  Admitted From: Home Disposition:  Home  Recommendations for Outpatient Follow-up:  Follow up with PCP in 1-2 weeks   Home Health:No Equipment/Devices:None   Discharge Condition:Stable  CODE STATUS:FULL  Diet recommendation: Heart healthy  Brief/Interim Summary:  62 year old female with history of COPD, anemia, anxiety, depression, hypertension, hyperlipidemia, who presents to the emergency department for chief concerns of shortness of breath and cough for 4 days.    She reports shortness of breath worse with exertion and cough. She reprots the cough is productive to yellow sputum. She denies known sick contacts other than her visit to [facility] to refill her lorazepam.    Over the past 20 years, she has had lorazepam 0.5 mg twice a day.  However her PCP retired and 2 weeks ago she was seen by a new provider and she was started on buspar for about two weeks and was given only 15 tablets of her lorazepam.     She finished the lorazepam has not slept at all since being off the lorazepam.  She has been anxious and tremulous.  She feels like she is about to have a panic attack over the last couple of days.   Patient was treated for decompensated COPD with intravenous steroids and aggressive bronchodilator regimen.  Lung sounds improved.  Patient ambulated on day of discharge and he did not desaturate beyond 88%.  At time of discharge had a lengthy discussion regarding her home lorazepam.  I have agreed to refill her lorazepam at her previous prescription of 0.5 mg twice daily with conversation that I would not be able to provide any refills and that she would have to seek further consultation with an outpatient primary care physician to discuss any potential refills of this medication.  She expressed  understanding.    Discharge Diagnoses:  Principal Problem:   COPD exacerbation (HCC) Active Problems:   Anxiety   COPD (chronic obstructive pulmonary disease) (HCC)   Tobacco abuse   Essential hypertension, benign   Hyponatremia   Mixed hyperlipidemia  * COPD exacerbation (HCC) Unclear trigger.  Suspect triggered by severe anxiety in the setting of not having her chronic lorazepam.  Lung sounds improved at time of discharge.  No wheezing noted.  Air movement improved.  Ambulated without desaturation.  No indication for home oxygen. Plan: Prednisone 40 mg daily x 4 days to start 9/20.  Resume home bronchodilator regimen.  Smoking cessation advised.  Follow-up outpatient PCP.     Generalized anxiety disorder Patient was recently seen by a new primary care physician.  Patient has been on lorazepam 0.5 mg twice daily x 20 years.  Recently weaned off of lorazepam and started on BuSpar.  Patient states that she did not tolerate BuSpar and had severe anxiety since finishing the remainder of her lorazepam prescription. Plan: Lorazepam restarted.  0.5 mg twice daily Patient does not tolerate BuSpar, will discontinue Patient will need follow-up appointment with PCP to discuss need for lorazepam.  Patient has been on this medication chronically for 20 years and I suspect she triggered a COPD exacerbation in the setting of benzodiazepine withdrawal.  I discussed this at length with patient at bedside.  I will refill her lorazepam at previous dose of 0.5 mg twice daily.  She understands that she needs to seek follow-up with her primary care physician to discuss any potential  refills of this medication beyond what I have provided   Mixed hyperlipidemia Rosuvastatin 5 mg daily resumed   Essential hypertension, benign Home ramipril 10 mg daily resumed    Tobacco abuse Counseled on cessation  Discharge Instructions  Discharge Instructions     Diet - low sodium heart healthy   Complete by: As  directed    Increase activity slowly   Complete by: As directed       Allergies as of 06/22/2023   No Known Allergies      Medication List     STOP taking these medications    busPIRone 15 MG tablet Commonly known as: BUSPAR   cetirizine 10 MG chewable tablet Commonly known as: ZYRTEC   fluticasone 50 MCG/ACT nasal spray Commonly known as: FLONASE   Incruse Ellipta 62.5 MCG/INH Aepb Generic drug: umeclidinium bromide       TAKE these medications    albuterol 108 (90 Base) MCG/ACT inhaler Commonly known as: VENTOLIN HFA Inhale 2 puffs into the lungs every 6 (six) hours as needed for shortness of breath.   citalopram 40 MG tablet Commonly known as: CELEXA Take 1 tablet (40 mg total) by mouth daily.   guaiFENesin 600 MG 12 hr tablet Commonly known as: MUCINEX Take 1 tablet (600 mg total) by mouth 2 (two) times daily for 7 days.   ipratropium-albuterol 0.5-2.5 (3) MG/3ML Soln Commonly known as: DUONEB Take 3 mLs by nebulization every 6 (six) hours as needed.   LORazepam 0.5 MG tablet Commonly known as: ATIVAN Take 1 tablet (0.5 mg total) by mouth 2 (two) times daily. What changed:  how much to take how to take this when to take this additional instructions   predniSONE 10 MG tablet Commonly known as: DELTASONE Take 4 tablets (40 mg total) by mouth daily for 4 days. Start taking on: June 23, 2023   ramipril 10 MG capsule Commonly known as: ALTACE Take 1 capsule (10 mg total) by mouth daily.   rosuvastatin 5 MG tablet Commonly known as: CRESTOR Take by mouth.   Trelegy Ellipta 100-62.5-25 MCG/ACT Aepb Generic drug: Fluticasone-Umeclidin-Vilant USE ONE INHALATION INTO THE LUNGS ONCE A DAY RINSE MOUTH AFTER EACH USE        No Known Allergies  Consultations: None   Procedures/Studies: DG Chest 2 View  Result Date: 06/20/2023 CLINICAL DATA:  Shortness of breath.  Cough. EXAM: CHEST - 2 VIEW COMPARISON:  08/09/2012. FINDINGS: Bilateral  lung fields are clear. Bilateral costophrenic angles are clear. Normal cardio-mediastinal silhouette. No acute osseous abnormalities. The soft tissues are within normal limits. IMPRESSION: 1. No active cardiopulmonary disease. Electronically Signed   By: Jules Schick M.D.   On: 06/20/2023 14:39      Subjective: Seen and examined on the day of discharge.  Stable no distress.  Appropriate for discharge home.  Discharge Exam: Vitals:   06/22/23 0000 06/22/23 0732  BP: (!) 106/55 138/74  Pulse: 74 79  Resp: 16 18  Temp: 98.1 F (36.7 C) 97.7 F (36.5 C)  SpO2: 95% 98%   Vitals:   06/21/23 1549 06/21/23 2118 06/22/23 0000 06/22/23 0732  BP: (!) 148/82  (!) 106/55 138/74  Pulse: 95  74 79  Resp: 15  16 18   Temp: 97.6 F (36.4 C)  98.1 F (36.7 C) 97.7 F (36.5 C)  TempSrc:   Oral Oral  SpO2: 93% 90% 95% 98%  Weight:      Height:        General: Pt is  alert, awake, not in acute distress Cardiovascular: RRR, S1/S2 +, no rubs, no gallops Respiratory: CTA bilaterally, no wheezing, no rhonchi Abdominal: Soft, NT, ND, bowel sounds + Extremities: no edema, no cyanosis    The results of significant diagnostics from this hospitalization (including imaging, microbiology, ancillary and laboratory) are listed below for reference.     Microbiology: Recent Results (from the past 240 hour(s))  Resp panel by RT-PCR (RSV, Flu A&B, Covid) Anterior Nasal Swab     Status: None   Collection Time: 06/20/23  3:44 PM   Specimen: Anterior Nasal Swab  Result Value Ref Range Status   SARS Coronavirus 2 by RT PCR NEGATIVE NEGATIVE Final    Comment: (NOTE) SARS-CoV-2 target nucleic acids are NOT DETECTED.  The SARS-CoV-2 RNA is generally detectable in upper respiratory specimens during the acute phase of infection. The lowest concentration of SARS-CoV-2 viral copies this assay can detect is 138 copies/mL. A negative result does not preclude SARS-Cov-2 infection and should not be used as the  sole basis for treatment or other patient management decisions. A negative result may occur with  improper specimen collection/handling, submission of specimen other than nasopharyngeal swab, presence of viral mutation(s) within the areas targeted by this assay, and inadequate number of viral copies(<138 copies/mL). A negative result must be combined with clinical observations, patient history, and epidemiological information. The expected result is Negative.  Fact Sheet for Patients:  BloggerCourse.com  Fact Sheet for Healthcare Providers:  SeriousBroker.it  This test is no t yet approved or cleared by the [place] FDA and  has been authorized for detection and/or diagnosis of SARS-CoV-2 by FDA under an Emergency Use Authorization (EUA). This EUA will remain  in effect (meaning this test can be used) for the duration of the COVID-19 declaration under Section 564(b)(1) of the Act, 21 U.S.C.section 360bbb-3(b)(1), unless the authorization is terminated  or revoked sooner.       Influenza A by PCR NEGATIVE NEGATIVE Final   Influenza B by PCR NEGATIVE NEGATIVE Final    Comment: (NOTE) The Xpert Xpress SARS-CoV-2/FLU/RSV plus assay is intended as an aid in the diagnosis of influenza from Nasopharyngeal swab specimens and should not be used as a sole basis for treatment. Nasal washings and aspirates are unacceptable for Xpert Xpress SARS-CoV-2/FLU/RSV testing.  Fact Sheet for Patients: BloggerCourse.com  Fact Sheet for Healthcare Providers: SeriousBroker.it  This test is not yet approved or cleared by the [place] FDA and has been authorized for detection and/or diagnosis of SARS-CoV-2 by FDA under an Emergency Use Authorization (EUA). This EUA will remain in effect (meaning this test can be used) for the duration of the COVID-19 declaration under Section 564(b)(1) of the  Act, 21 U.S.C. section 360bbb-3(b)(1), unless the authorization is terminated or revoked.     Resp Syncytial Virus by PCR NEGATIVE NEGATIVE Final    Comment: (NOTE) Fact Sheet for Patients: BloggerCourse.com  Fact Sheet for Healthcare Providers: SeriousBroker.it  This test is not yet approved or cleared by the [place] FDA and has been authorized for detection and/or diagnosis of SARS-CoV-2 by FDA under an Emergency Use Authorization (EUA). This EUA will remain in effect (meaning this test can be used) for the duration of the COVID-19 declaration under Section 564(b)(1) of the Act, 21 U.S.C. section 360bbb-3(b)(1), unless the authorization is terminated or revoked.  Performed at [facility], [address] [place], [place] [address]      Labs: BNP (last 3 results) No results for input(s): "  BNP" in the last 8760 hours. Basic Metabolic Panel: Recent Labs  Lab 06/20/23 1220 06/21/23 0315  NA 131* 129*  K 4.2 4.7  CL 98 95*  CO2 21* 24  GLUCOSE 82 136*  BUN <5* 9  CREATININE 0.59 0.54  CALCIUM 9.2 9.0   Liver Function Tests: No results for input(s): "AST", "ALT", "ALKPHOS", "BILITOT", "PROT", "ALBUMIN" in the last 168 hours. No results for input(s): "LIPASE", "AMYLASE" in the last 168 hours. No results for input(s): "AMMONIA" in the last 168 hours. CBC: Recent Labs  Lab 06/20/23 1220 06/21/23 0315  WBC 7.8 4.5  HGB 13.6 12.4  HCT 38.6 36.3  MCV 95.3 95.5  PLT 476* 431*   Cardiac Enzymes: No results for input(s): "CKTOTAL", "CKMB", "CKMBINDEX", "TROPONINI" in the last 168 hours. BNP: Invalid input(s): "POCBNP" CBG: No results for input(s): "GLUCAP" in the last 168 hours. D-Dimer No results for input(s): "DDIMER" in the last 72 hours. Hgb A1c No results for input(s): "HGBA1C" in the last 72 hours. Lipid Profile No results for input(s): "CHOL", "HDL", "LDLCALC", "TRIG", "CHOLHDL",  "LDLDIRECT" in the last 72 hours. Thyroid function studies No results for input(s): "TSH", "T4TOTAL", "T3FREE", "THYROIDAB" in the last 72 hours.  Invalid input(s): "FREET3" Anemia work up No results for input(s): "VITAMINB12", "FOLATE", "FERRITIN", "TIBC", "IRON", "RETICCTPCT" in the last 72 hours. Urinalysis No results found for: "COLORURINE", "APPEARANCEUR", "LABSPEC", "PHURINE", "GLUCOSEU", "HGBUR", "BILIRUBINUR", "KETONESUR", "PROTEINUR", "UROBILINOGEN", "NITRITE", "LEUKOCYTESUR" Sepsis Labs Recent Labs  Lab 06/20/23 1220 06/21/23 0315  WBC 7.8 4.5   Microbiology Recent Results (from the past 240 hour(s))  Resp panel by RT-PCR (RSV, Flu A&B, Covid) Anterior Nasal Swab     Status: None   Collection Time: 06/20/23  3:44 PM   Specimen: Anterior Nasal Swab  Result Value Ref Range Status   SARS Coronavirus 2 by RT PCR NEGATIVE NEGATIVE Final    Comment: (NOTE) SARS-CoV-2 target nucleic acids are NOT DETECTED.  The SARS-CoV-2 RNA is generally detectable in upper respiratory specimens during the acute phase of infection. The lowest concentration of SARS-CoV-2 viral copies this assay can detect is 138 copies/mL. A negative result does not preclude SARS-Cov-2 infection and should not be used as the sole basis for treatment or other patient management decisions. A negative result may occur with  improper specimen collection/handling, submission of specimen other than nasopharyngeal swab, presence of viral mutation(s) within the areas targeted by this assay, and inadequate number of viral copies(<138 copies/mL). A negative result must be combined with clinical observations, patient history, and epidemiological information. The expected result is Negative.  Fact Sheet for Patients:  BloggerCourse.com  Fact Sheet for Healthcare Providers:  SeriousBroker.it  This test is no t yet approved or cleared by the [place] FDA and   has been authorized for detection and/or diagnosis of SARS-CoV-2 by FDA under an Emergency Use Authorization (EUA). This EUA will remain  in effect (meaning this test can be used) for the duration of the COVID-19 declaration under Section 564(b)(1) of the Act, 21 U.S.C.section 360bbb-3(b)(1), unless the authorization is terminated  or revoked sooner.       Influenza A by PCR NEGATIVE NEGATIVE Final   Influenza B by PCR NEGATIVE NEGATIVE Final    Comment: (NOTE) The Xpert Xpress SARS-CoV-2/FLU/RSV plus assay is intended as an aid in the diagnosis of influenza from Nasopharyngeal swab specimens and should not be used as a sole basis for treatment. Nasal washings and aspirates are unacceptable for Xpert Xpress SARS-CoV-2/FLU/RSV testing.  Fact  Sheet for Patients: BloggerCourse.com  Fact Sheet for Healthcare Providers: SeriousBroker.it  This test is not yet approved or cleared by the [place] FDA and has been authorized for detection and/or diagnosis of SARS-CoV-2 by FDA under an Emergency Use Authorization (EUA). This EUA will remain in effect (meaning this test can be used) for the duration of the COVID-19 declaration under Section 564(b)(1) of the Act, 21 U.S.C. section 360bbb-3(b)(1), unless the authorization is terminated or revoked.     Resp Syncytial Virus by PCR NEGATIVE NEGATIVE Final    Comment: (NOTE) Fact Sheet for Patients: BloggerCourse.com  Fact Sheet for Healthcare Providers: SeriousBroker.it  This test is not yet approved or cleared by the [place] FDA and has been authorized for detection and/or diagnosis of SARS-CoV-2 by FDA under an Emergency Use Authorization (EUA). This EUA will remain in effect (meaning this test can be used) for the duration of the COVID-19 declaration under Section 564(b)(1) of the Act, 21 U.S.C. section 360bbb-3(b)(1),  unless the authorization is terminated or revoked.  Performed at [facility], [address] [place], [place] [address]      Time coordinating discharge: Over 30 minutes  SIGNED:   Tresa Moore, MD  [facility] 06/22/2023, 12:50 PM Pager   If 7PM-7AM, please contact night-coverage

## 2023-06-22 NOTE — Progress Notes (Signed)
DISCHARGE NOTE:    Pt discharged with Iv removed and personal belongings bag. Pt also has personal purse with her as well. Pt educated and discharge instructions given. No further questions or concerns at this time. Pt wheeled down to medical mall entrance, family friend provided transportation.

## 2023-06-22 NOTE — Progress Notes (Signed)
Discharge instructions reviewed with patient. She verbalized understanding of discharge instructions

## 2023-06-28 ENCOUNTER — Encounter: Payer: Self-pay | Admitting: Cardiology

## 2023-06-28 ENCOUNTER — Ambulatory Visit: Payer: Managed Care, Other (non HMO) | Admitting: Cardiology

## 2023-06-28 ENCOUNTER — Telehealth: Payer: Self-pay

## 2023-06-28 VITALS — BP 130/70 | HR 84 | Ht 64.5 in | Wt 124.8 lb

## 2023-06-28 DIAGNOSIS — J449 Chronic obstructive pulmonary disease, unspecified: Secondary | ICD-10-CM

## 2023-06-28 DIAGNOSIS — F419 Anxiety disorder, unspecified: Secondary | ICD-10-CM | POA: Diagnosis not present

## 2023-06-28 MED ORDER — ROSUVASTATIN CALCIUM 10 MG PO TABS: 10.0000 mg | ORAL_TABLET | Freq: Every day | ORAL | 1 refills | Status: DC

## 2023-06-28 MED ORDER — CITALOPRAM HYDROBROMIDE 40 MG PO TABS
40.0000 mg | ORAL_TABLET | Freq: Every day | ORAL | 1 refills | Status: DC
Start: 2023-06-28 — End: 2023-11-17

## 2023-06-28 MED ORDER — FLUTICASONE FUROATE-VILANTEROL 100-25 MCG/ACT IN AEPB: 1.0000 | INHALATION_SPRAY | Freq: Every day | RESPIRATORY_TRACT | 3 refills | Status: DC

## 2023-06-28 NOTE — Progress Notes (Signed)
Established Patient Office Visit  Subjective:  Patient ID: Christine Sparks, female    DOB: 18-Apr-1961  Age: 62 y.o. MRN: [id number]  Chief Complaint  Patient presents with   Follow-up    Hospital f/u     Patient in office for hospital follow up, discuss medications. Patient reports feeling better. Patient in [facility] for COPD exacerbation, withdrawal from lorazepam.  Patient scheduled to see psych 07/26/2023. Patient requesting a refill on Breo, she was given this in the hospital and feels it works better than Trilegy. Medications refilled.  Patient scheduled for mammogram.     No other concerns at this time.   Past Medical History:  Diagnosis Date   Anemia    Anxiety    Arthritis    Bronchitis, chronic obstructive    Depression    Diabetes mellitus jan 2012   a1c 5.7, fasting glu 155   Dyspnea    with exertion   H/O bronchitis    Heart murmur    High cholesterol    Hypertension    Menopausal syndrome    Tobacco abuse     Past Surgical History:  Procedure Laterality Date   BREAST BIOPSY Left 07/26/2016   complex sclerosing lesion. Coil shape marker   BREAST LUMPECTOMY Left 08/17/2016   complex sclerosing lesion with focal ADH. Clear margins. The coil shaped biopsy marker remains    BREAST LUMPECTOMY WITH NEEDLE LOCALIZATION Left 08/17/2016   Procedure: BREAST LUMPECTOMY WITH NEEDLE LOCALIZATION;  Surgeon: Kieth Brightly, MD;  Location: [facility] ORS;  Service: General;  Laterality: Left;   CERVICAL CONE BIOPSY     DILATION AND CURETTAGE OF UTERUS     EXCISION / BIOPSY BREAST / NIPPLE / DUCT Left 08/17/2016   complex sclerosing lesion removed   LIPOMA EXCISION Right    benign tumor right leg   LYMPH NODE BIOPSY     TONSILLECTOMY      Social History   Socioeconomic History   Marital status: Divorced    Spouse name: Not on file   Number of children: Not on file   Years of education: Not on file   Highest education level: Not on file  Occupational History    Not on file  Tobacco Use   Smoking status: Every Day    Current packs/day: 0.50    Types: Cigarettes   Smokeless tobacco: Never  Substance and Sexual Activity   Alcohol use: Yes    Comment: occasionally   Drug use: No   Sexual activity: Not Currently    Birth control/protection: Post-menopausal  Other Topics Concern   Not on file  Social History Narrative   Not on file   Social Determinants of Health   Financial Resource Strain: Not on file  Food Insecurity: No Food Insecurity (06/20/2023)   Hunger Vital Sign    Worried About Running Out of Food in the Last Year: Never true    Ran Out of Food in the Last Year: Never true  Transportation Needs: No Transportation Needs (06/20/2023)   PRAPARE - Administrator, Civil Service (Medical): No    Lack of Transportation (Non-Medical): No  Physical Activity: Not on file  Stress: Not on file  Social Connections: Not on file  Intimate Partner Violence: Not At Risk (06/20/2023)   Humiliation, Afraid, Rape, and Kick questionnaire    Fear of Current or Ex-Partner: No    Emotionally Abused: No    Physically Abused: No    Sexually Abused: No  Family History  Problem Relation Age of Onset   Cancer Mother        melanoma   Cancer Father        Lung Cancer   Early death Brother    Heart disease Brother    Breast cancer Paternal Aunt 82   Colon cancer Other    Ovarian cancer Neg Hx    Diabetes Neg Hx     Allergies  Allergen Reactions   Buspar [Buspirone] Other (See Comments)    Made her "feel crazy"    Review of Systems  Constitutional: Negative.   HENT: Negative.    Eyes: Negative.   Respiratory: Negative.  Negative for shortness of breath.   Cardiovascular: Negative.  Negative for chest pain.  Gastrointestinal: Negative.  Negative for abdominal pain, constipation and diarrhea.  Genitourinary: Negative.   Musculoskeletal:  Negative for joint pain and myalgias.  Skin: Negative.   Neurological: Negative.   Negative for dizziness and headaches.  Endo/Heme/Allergies: Negative.   All other systems reviewed and are negative.      Objective:   BP 130/70   Pulse 84   Ht 5' 4.5" (1.638 m)   Wt 124 lb 12.8 oz (56.6 kg)   SpO2 94%   BMI 21.09 kg/m   Vitals:   06/28/23 1103  BP: 130/70  Pulse: 84  Height: 5' 4.5" (1.638 m)  Weight: 124 lb 12.8 oz (56.6 kg)  SpO2: 94%  BMI (Calculated): 21.1    Physical Exam Vitals and nursing note reviewed.  Constitutional:      Appearance: Normal appearance. She is normal weight.  HENT:     Head: Normocephalic and atraumatic.     Nose: Nose normal.     Mouth/Throat:     Mouth: Mucous membranes are moist.  Eyes:     Extraocular Movements: Extraocular movements intact.     Conjunctiva/sclera: Conjunctivae normal.     Pupils: Pupils are equal, round, and reactive to light.  Cardiovascular:     Rate and Rhythm: Normal rate and regular rhythm.     Pulses: Normal pulses.     Heart sounds: Normal heart sounds.  Pulmonary:     Effort: Pulmonary effort is normal.     Breath sounds: Normal breath sounds.  Abdominal:     General: Abdomen is flat. Bowel sounds are normal.     Palpations: Abdomen is soft.  Musculoskeletal:        General: Normal range of motion.     Cervical back: Normal range of motion.  Skin:    General: Skin is warm and dry.  Neurological:     General: No focal deficit present.     Mental Status: She is alert and oriented to person, place, and time.  Psychiatric:        Mood and Affect: Mood normal.        Behavior: Behavior normal.        Thought Content: Thought content normal.        Judgment: Judgment normal.      No results found for any visits on 06/28/23.  Recent Results (from the past 2160 hour(s))  Basic metabolic panel     Status: Abnormal   Collection Time: 06/20/23 12:20 PM  Result Value Ref Range   Sodium 131 (L) 135 - 145 mmol/L   Potassium 4.2 3.5 - 5.1 mmol/L   Chloride 98 98 - 111 mmol/L   CO2  21 (L) 22 - 32 mmol/L   Glucose, Bld  82 70 - 99 mg/dL    Comment: Glucose reference range applies only to samples taken after fasting for at least 8 hours.   BUN <5 (L) 8 - 23 mg/dL   Creatinine, Ser 4.25 0.44 - 1.00 mg/dL   Calcium 9.2 8.9 - 95.6 mg/dL   GFR, Estimated >38 >75 mL/min    Comment: (NOTE) Calculated using the CKD-EPI Creatinine Equation (2021)    Anion gap 12 5 - 15    Comment: Performed at [facility], [address] [place], [place] [address]  CBC     Status: Abnormal   Collection Time: 06/20/23 12:20 PM  Result Value Ref Range   WBC 7.8 4.0 - 10.5 K/uL   RBC 4.05 3.87 - 5.11 MIL/uL   Hemoglobin 13.6 12.0 - 15.0 g/dL   HCT 95.1 88.4 - 16.6 %   MCV 95.3 80.0 - 100.0 fL   MCH 33.6 26.0 - 34.0 pg   MCHC 35.2 30.0 - 36.0 g/dL   RDW 06.3 01.6 - 01.0 %   Platelets 476 (H) 150 - 400 K/uL   nRBC 0.0 0.0 - 0.2 %    Comment: Performed at [facility], [address], [place], [place] [address]  Resp panel by RT-PCR (RSV, Flu A&B, Covid) Anterior Nasal Swab     Status: None   Collection Time: 06/20/23  3:44 PM   Specimen: Anterior Nasal Swab  Result Value Ref Range   SARS Coronavirus 2 by RT PCR NEGATIVE NEGATIVE    Comment: (NOTE) SARS-CoV-2 target nucleic acids are NOT DETECTED.  The SARS-CoV-2 RNA is generally detectable in upper respiratory specimens during the acute phase of infection. The lowest concentration of SARS-CoV-2 viral copies this assay can detect is 138 copies/mL. A negative result does not preclude SARS-Cov-2 infection and should not be used as the sole basis for treatment or other patient management decisions. A negative result may occur with  improper specimen collection/handling, submission of specimen other than nasopharyngeal swab, presence of viral mutation(s) within the areas targeted by this assay, and inadequate number of viral copies(<138 copies/mL). A negative result must be combined with clinical observations,  patient history, and epidemiological information. The expected result is Negative.  Fact Sheet for Patients:  BloggerCourse.com  Fact Sheet for Healthcare Providers:  SeriousBroker.it  This test is no t yet approved or cleared by the [place] FDA and  has been authorized for detection and/or diagnosis of SARS-CoV-2 by FDA under an Emergency Use Authorization (EUA). This EUA will remain  in effect (meaning this test can be used) for the duration of the COVID-19 declaration under Section 564(b)(1) of the Act, 21 U.S.C.section 360bbb-3(b)(1), unless the authorization is terminated  or revoked sooner.       Influenza A by PCR NEGATIVE NEGATIVE   Influenza B by PCR NEGATIVE NEGATIVE    Comment: (NOTE) The Xpert Xpress SARS-CoV-2/FLU/RSV plus assay is intended as an aid in the diagnosis of influenza from Nasopharyngeal swab specimens and should not be used as a sole basis for treatment. Nasal washings and aspirates are unacceptable for Xpert Xpress SARS-CoV-2/FLU/RSV testing.  Fact Sheet for Patients: BloggerCourse.com  Fact Sheet for Healthcare Providers: SeriousBroker.it  This test is not yet approved or cleared by the [place] FDA and has been authorized for detection and/or diagnosis of SARS-CoV-2 by FDA under an Emergency Use Authorization (EUA). This EUA will remain in effect (meaning this test can be used) for the duration of the COVID-19 declaration under Section  564(b)(1) of the Act, 21 U.S.C. section 360bbb-3(b)(1), unless the authorization is terminated or revoked.     Resp Syncytial Virus by PCR NEGATIVE NEGATIVE    Comment: (NOTE) Fact Sheet for Patients: BloggerCourse.com  Fact Sheet for Healthcare Providers: SeriousBroker.it  This test is not yet approved or cleared by the [place] FDA and has  been authorized for detection and/or diagnosis of SARS-CoV-2 by FDA under an Emergency Use Authorization (EUA). This EUA will remain in effect (meaning this test can be used) for the duration of the COVID-19 declaration under Section 564(b)(1) of the Act, 21 U.S.C. section 360bbb-3(b)(1), unless the authorization is terminated or revoked.  Performed at [facility], [address] [place], [place] [address]   Basic metabolic panel     Status: Abnormal   Collection Time: 06/21/23  3:15 AM  Result Value Ref Range   Sodium 129 (L) 135 - 145 mmol/L   Potassium 4.7 3.5 - 5.1 mmol/L   Chloride 95 (L) 98 - 111 mmol/L   CO2 24 22 - 32 mmol/L   Glucose, Bld 136 (H) 70 - 99 mg/dL    Comment: Glucose reference range applies only to samples taken after fasting for at least 8 hours.   BUN 9 8 - 23 mg/dL   Creatinine, Ser 6.04 0.44 - 1.00 mg/dL   Calcium 9.0 8.9 - 54.0 mg/dL   GFR, Estimated >98 >11 mL/min    Comment: (NOTE) Calculated using the CKD-EPI Creatinine Equation (2021)    Anion gap 10 5 - 15    Comment: Performed at [facility], [address] [place], [place] [address]  CBC     Status: Abnormal   Collection Time: 06/21/23  3:15 AM  Result Value Ref Range   WBC 4.5 4.0 - 10.5 K/uL   RBC 3.80 (L) 3.87 - 5.11 MIL/uL   Hemoglobin 12.4 12.0 - 15.0 g/dL   HCT 29.5 62.1 - 30.8 %   MCV 95.5 80.0 - 100.0 fL   MCH 32.6 26.0 - 34.0 pg   MCHC 34.2 30.0 - 36.0 g/dL   RDW 65.7 84.6 - 96.2 %   Platelets 431 (H) 150 - 400 K/uL   nRBC 0.0 0.0 - 0.2 %    Comment: Performed at [facility], [address], [place], [place] [address]      Assessment & Plan:  Mammogram as scheduled.  Psychiatry as scheduled for lorazepam.  Breo sent to the pharmacy.   Problem List Items Addressed This Visit       Respiratory   COPD (chronic obstructive pulmonary disease) (HCC) - Primary   Relevant Medications   fluticasone furoate-vilanterol (BREO ELLIPTA) 100-25  MCG/ACT AEPB     Other   Anxiety   Relevant Medications   citalopram (CELEXA) 40 MG tablet    Return in about 3 months (around 09/27/2023).   Total time spent: 25 minutes  [employer], NP  06/28/2023   This document may have been prepared by Dragon Voice Recognition software and as such may include unintentional dictation errors.

## 2023-06-29 ENCOUNTER — Other Ambulatory Visit: Payer: Self-pay | Admitting: Cardiology

## 2023-06-29 MED ORDER — FLUTICASONE FUROATE-VILANTEROL 100-25 MCG/ACT IN AEPB: 1.0000 | INHALATION_SPRAY | Freq: Every day | RESPIRATORY_TRACT | 3 refills | Status: DC

## 2023-07-13 NOTE — Telephone Encounter (Signed)
Completed.

## 2023-08-01 ENCOUNTER — Ambulatory Visit: Payer: Managed Care, Other (non HMO) | Admitting: Cardiology

## 2023-09-19 ENCOUNTER — Ambulatory Visit: Payer: Managed Care, Other (non HMO) | Admitting: Pediatrics

## 2023-09-19 ENCOUNTER — Inpatient Hospital Stay
Admission: EM | Admit: 2023-09-19 | Discharge: 2023-09-23 | DRG: 189 | Disposition: A | Discharge status: Home / Self Care | Payer: Managed Care, Other (non HMO) | Attending: Internal Medicine | Admitting: Internal Medicine

## 2023-09-19 ENCOUNTER — Other Ambulatory Visit: Payer: Self-pay

## 2023-09-19 ENCOUNTER — Encounter: Payer: Self-pay | Admitting: Radiology

## 2023-09-19 ENCOUNTER — Emergency Department: Payer: Managed Care, Other (non HMO)

## 2023-09-19 DIAGNOSIS — R7989 Other specified abnormal findings of blood chemistry: Secondary | ICD-10-CM

## 2023-09-19 DIAGNOSIS — E119 Type 2 diabetes mellitus without complications: Secondary | ICD-10-CM | POA: Diagnosis present

## 2023-09-19 DIAGNOSIS — Z808 Family history of malignant neoplasm of other organs or systems: Secondary | ICD-10-CM

## 2023-09-19 DIAGNOSIS — Z79899 Other long term (current) drug therapy: Secondary | ICD-10-CM | POA: Diagnosis not present

## 2023-09-19 DIAGNOSIS — J9601 Acute respiratory failure with hypoxia: Secondary | ICD-10-CM | POA: Diagnosis present

## 2023-09-19 DIAGNOSIS — E782 Mixed hyperlipidemia: Secondary | ICD-10-CM | POA: Diagnosis present

## 2023-09-19 DIAGNOSIS — R0602 Shortness of breath: Secondary | ICD-10-CM | POA: Diagnosis present

## 2023-09-19 DIAGNOSIS — Z8249 Family history of ischemic heart disease and other diseases of the circulatory system: Secondary | ICD-10-CM

## 2023-09-19 DIAGNOSIS — I2489 Other forms of acute ischemic heart disease: Secondary | ICD-10-CM | POA: Diagnosis present

## 2023-09-19 DIAGNOSIS — E871 Hypo-osmolality and hyponatremia: Secondary | ICD-10-CM | POA: Diagnosis present

## 2023-09-19 DIAGNOSIS — I214 Non-ST elevation (NSTEMI) myocardial infarction: Secondary | ICD-10-CM | POA: Diagnosis not present

## 2023-09-19 DIAGNOSIS — Z1152 Encounter for screening for COVID-19: Secondary | ICD-10-CM

## 2023-09-19 DIAGNOSIS — E785 Hyperlipidemia, unspecified: Secondary | ICD-10-CM | POA: Diagnosis not present

## 2023-09-19 DIAGNOSIS — Z801 Family history of malignant neoplasm of trachea, bronchus and lung: Secondary | ICD-10-CM

## 2023-09-19 DIAGNOSIS — M1990 Unspecified osteoarthritis, unspecified site: Secondary | ICD-10-CM | POA: Diagnosis present

## 2023-09-19 DIAGNOSIS — Z803 Family history of malignant neoplasm of breast: Secondary | ICD-10-CM

## 2023-09-19 DIAGNOSIS — J441 Chronic obstructive pulmonary disease with (acute) exacerbation: Secondary | ICD-10-CM | POA: Diagnosis present

## 2023-09-19 DIAGNOSIS — Z720 Tobacco use: Secondary | ICD-10-CM | POA: Diagnosis present

## 2023-09-19 DIAGNOSIS — F17210 Nicotine dependence, cigarettes, uncomplicated: Secondary | ICD-10-CM | POA: Diagnosis present

## 2023-09-19 DIAGNOSIS — Z888 Allergy status to other drugs, medicaments and biological substances status: Secondary | ICD-10-CM

## 2023-09-19 DIAGNOSIS — Z881 Allergy status to other antibiotic agents status: Secondary | ICD-10-CM | POA: Diagnosis not present

## 2023-09-19 DIAGNOSIS — I10 Essential (primary) hypertension: Secondary | ICD-10-CM | POA: Diagnosis present

## 2023-09-19 DIAGNOSIS — Z800 Family history of malignant neoplasm of digestive organs: Secondary | ICD-10-CM | POA: Diagnosis not present

## 2023-09-19 HISTORY — DX: Acute respiratory failure with hypoxia: J96.01

## 2023-09-19 HISTORY — DX: Non-ST elevation (NSTEMI) myocardial infarction: I21.4

## 2023-09-19 LAB — CBC WITH DIFFERENTIAL/PLATELET
Abs Immature Granulocytes: 0.02 10*3/uL (ref 0.00–0.07)
Basophils Absolute: 0.1 10*3/uL (ref 0.0–0.1)
Basophils Relative: 1 %
Eosinophils Absolute: 0 10*3/uL (ref 0.0–0.5)
Eosinophils Relative: 1 %
HCT: 41.7 % (ref 36.0–46.0)
Hemoglobin: 14.7 g/dL (ref 12.0–15.0)
Immature Granulocytes: 0 %
Lymphocytes Relative: 16 %
Lymphs Abs: 1 10*3/uL (ref 0.7–4.0)
MCH: 33.4 pg (ref 26.0–34.0)
MCHC: 35.3 g/dL (ref 30.0–36.0)
MCV: 94.8 fL (ref 80.0–100.0)
Monocytes Absolute: 0.5 10*3/uL (ref 0.1–1.0)
Monocytes Relative: 7 %
Neutro Abs: 4.9 10*3/uL (ref 1.7–7.7)
Neutrophils Relative %: 75 %
Platelets: 446 10*3/uL — ABNORMAL HIGH (ref 150–400)
RBC: 4.4 MIL/uL (ref 3.87–5.11)
RDW: 12.4 % (ref 11.5–15.5)
WBC: 6.5 10*3/uL (ref 4.0–10.5)
nRBC: 0 % (ref 0.0–0.2)

## 2023-09-19 LAB — RESP PANEL BY RT-PCR (RSV, FLU A&B, COVID)  RVPGX2
Influenza A by PCR: NEGATIVE
Influenza B by PCR: NEGATIVE
Resp Syncytial Virus by PCR: NEGATIVE
SARS Coronavirus 2 by RT PCR: NEGATIVE

## 2023-09-19 LAB — TROPONIN I (HIGH SENSITIVITY)
Troponin I (High Sensitivity): 183 ng/L (ref <18)
Troponin I (High Sensitivity): 153 ng/L (ref <18)
Troponin I (High Sensitivity): 108 ng/L (ref <18)

## 2023-09-19 LAB — OSMOLALITY: Osmolality: 268 mosm/kg — ABNORMAL LOW (ref 275–295)

## 2023-09-19 LAB — COMPREHENSIVE METABOLIC PANEL
ALT: 26 U/L (ref 0–44)
AST: 34 U/L (ref 15–41)
Albumin: 4.1 g/dL (ref 3.5–5.0)
Alkaline Phosphatase: 104 U/L (ref 38–126)
Anion gap: 12 (ref 5–15)
BUN: 6 mg/dL — ABNORMAL LOW (ref 8–23)
CO2: 23 mmol/L (ref 22–32)
Calcium: 9.4 mg/dL (ref 8.9–10.3)
Chloride: 93 mmol/L — ABNORMAL LOW (ref 98–111)
Creatinine, Ser: 0.6 mg/dL (ref 0.44–1.00)
GFR, Estimated: >60 mL/min (ref >60)
Glucose, Bld: 150 mg/dL — ABNORMAL HIGH (ref 70–99)
Potassium: 4.3 mmol/L (ref 3.5–5.1)
Sodium: 128 mmol/L — ABNORMAL LOW (ref 135–145)
Total Bilirubin: 0.5 mg/dL (ref <1.2)
Total Protein: 7 g/dL (ref 6.5–8.1)

## 2023-09-19 LAB — BRAIN NATRIURETIC PEPTIDE: B Natriuretic Peptide: 137.3 pg/mL — ABNORMAL HIGH (ref 0.0–100.0)

## 2023-09-19 LAB — HIV ANTIBODY (ROUTINE TESTING W REFLEX): HIV Screen 4th Generation wRfx: NONREACTIVE

## 2023-09-19 MED ORDER — LORAZEPAM 2 MG/ML IJ SOLN
0.5000 mg | INTRAMUSCULAR | Status: DC | PRN
  Administered 2023-09-19: 0.5 mg via INTRAVENOUS
  Filled 2023-09-19: qty 1

## 2023-09-19 MED ORDER — ROSUVASTATIN CALCIUM 10 MG PO TABS
10.0000 mg | ORAL_TABLET | Freq: Every day | ORAL | Status: DC
  Administered 2023-09-19 – 2023-09-23 (×5): 10 mg via ORAL
  Filled 2023-09-19 (×5): qty 1

## 2023-09-19 MED ORDER — ONDANSETRON HCL 4 MG PO TABS: 4.0000 mg | ORAL_TABLET | Freq: Four times a day (QID) | ORAL | Status: DC | PRN

## 2023-09-19 MED ORDER — SODIUM CHLORIDE 0.9 % IV SOLN: INTRAVENOUS | Status: AC

## 2023-09-19 MED ORDER — ASPIRIN 81 MG PO CHEW
324.0000 mg | CHEWABLE_TABLET | Freq: Once | ORAL | Status: AC
  Administered 2023-09-19: 324 mg via ORAL
  Filled 2023-09-19: qty 4

## 2023-09-19 MED ORDER — ENOXAPARIN SODIUM 40 MG/0.4ML IJ SOSY
40.0000 mg | PREFILLED_SYRINGE | INTRAMUSCULAR | Status: DC
  Administered 2023-09-19 – 2023-09-22 (×4): 40 mg via SUBCUTANEOUS
  Filled 2023-09-19 (×4): qty 0.4

## 2023-09-19 MED ORDER — CITALOPRAM HYDROBROMIDE 20 MG PO TABS
40.0000 mg | ORAL_TABLET | Freq: Every day | ORAL | Status: DC
  Administered 2023-09-19 – 2023-09-23 (×5): 40 mg via ORAL
  Filled 2023-09-19 (×5): qty 2

## 2023-09-19 MED ORDER — NICOTINE 21 MG/24HR TD PT24
21.0000 mg | MEDICATED_PATCH | Freq: Every day | TRANSDERMAL | Status: DC
  Administered 2023-09-19 – 2023-09-23 (×5): 21 mg via TRANSDERMAL
  Filled 2023-09-19 (×5): qty 1

## 2023-09-19 MED ORDER — LORAZEPAM 2 MG/ML IJ SOLN
0.5000 mg | Freq: Once | INTRAMUSCULAR | Status: AC
  Administered 2023-09-19: 0.5 mg via INTRAVENOUS
  Filled 2023-09-19: qty 1

## 2023-09-19 MED ORDER — RAMIPRIL 5 MG PO CAPS
10.0000 mg | ORAL_CAPSULE | Freq: Every day | ORAL | Status: DC
  Administered 2023-09-19 – 2023-09-23 (×4): 10 mg via ORAL
  Filled 2023-09-19 (×4): qty 2
  Filled 2023-09-19: qty 1
  Filled 2023-09-19: qty 2

## 2023-09-19 MED ORDER — ONDANSETRON HCL 4 MG/2ML IJ SOLN: 4.0000 mg | Freq: Four times a day (QID) | INTRAMUSCULAR | Status: DC | PRN

## 2023-09-19 MED ORDER — IPRATROPIUM-ALBUTEROL 0.5-2.5 (3) MG/3ML IN SOLN
3.0000 mL | Freq: Once | RESPIRATORY_TRACT | Status: AC
  Administered 2023-09-19: 3 mL via RESPIRATORY_TRACT
  Filled 2023-09-19: qty 3

## 2023-09-19 MED ORDER — IOHEXOL 350 MG/ML SOLN
75.0000 mL | Freq: Once | INTRAVENOUS | Status: AC | PRN
  Administered 2023-09-19: 75 mL via INTRAVENOUS

## 2023-09-19 MED ORDER — METHYLPREDNISOLONE SODIUM SUCC 40 MG IJ SOLR
40.0000 mg | Freq: Two times a day (BID) | INTRAMUSCULAR | Status: AC
  Administered 2023-09-19 – 2023-09-20 (×2): 40 mg via INTRAVENOUS
  Filled 2023-09-19 (×2): qty 1

## 2023-09-19 MED ORDER — SODIUM CHLORIDE 0.9 % IV SOLN
1.0000 g | INTRAVENOUS | Status: DC
  Administered 2023-09-19 – 2023-09-21 (×3): 1 g via INTRAVENOUS
  Filled 2023-09-19 (×4): qty 10

## 2023-09-19 MED ORDER — PREDNISONE 20 MG PO TABS
40.0000 mg | ORAL_TABLET | Freq: Every day | ORAL | Status: DC
  Administered 2023-09-20 – 2023-09-22 (×3): 40 mg via ORAL
  Filled 2023-09-19 (×3): qty 2

## 2023-09-19 MED ORDER — ASPIRIN 81 MG PO CHEW
81.0000 mg | CHEWABLE_TABLET | Freq: Every day | ORAL | Status: DC
  Administered 2023-09-20 – 2023-09-23 (×4): 81 mg via ORAL
  Filled 2023-09-19 (×4): qty 1

## 2023-09-19 NOTE — Assessment & Plan Note (Signed)
Continue statin. 

## 2023-09-19 NOTE — ED Notes (Signed)
Patient transported to CT 

## 2023-09-19 NOTE — H&P (Signed)
History and Physical    Patient: Christine Sparks ZDG:[id number] DOB: [birth year] DOA: 09/19/2023 DOS: the patient was seen and examined on 09/19/2023 PCP: Margaretann Loveless, MD  Patient coming from: Home  Chief Complaint:  Chief Complaint  Patient presents with   Shortness of Breath   HPI: Christine Sparks is a 62 y.o. female with medical history significant of tobacco abuse, hypertension, hyperlipidemia, COPD presenting with acute respiratory failure with hypoxia, COPD exacerbation, NSTEMI, hyponatremia.  Patient reports increased work of breathing for the past 3 days.  Does not wear oxygen at home.  Smoking up to 1 pack/day up until 3 days ago.  No chest pain.  No nausea or vomiting.  No focal hemiparesis or confusion.  Has had increased cough, wheezing, sputum production.  No orthopnea PND.  No focal hemiparesis or confusion.  Has been using home inhalers with minimal improvement in symptoms. Presented to the ER afebrile, heart rate into the 100s, BP stable.  Initially placed on BiPAP to help with respiratory support.  White count 6.5, hemoglobin 14.7, platelets 446.  Troponin 1 83-1 53.  Creatinine 0.6, glucose 150.  Sodium 128.  EKG normal sinus rhythm.  CT of the chest negative for PE.  Positive emphysematous lung changes. Review of Systems: As mentioned in the history of present illness. All other systems reviewed and are negative. Past Medical History:  Diagnosis Date   Anemia    Anxiety    Arthritis    Bronchitis, chronic obstructive (HCC)    Depression    Diabetes mellitus jan 2012   a1c 5.7, fasting glu 155   Dyspnea    with exertion   H/O bronchitis    Heart murmur    High cholesterol    Hypertension    Menopausal syndrome    Tobacco abuse    Past Surgical History:  Procedure Laterality Date   BREAST BIOPSY Left 07/26/2016   complex sclerosing lesion. Coil shape marker   BREAST LUMPECTOMY Left 08/17/2016   complex sclerosing lesion with focal ADH. Clear margins. The coil  shaped biopsy marker remains    BREAST LUMPECTOMY WITH NEEDLE LOCALIZATION Left 08/17/2016   Procedure: BREAST LUMPECTOMY WITH NEEDLE LOCALIZATION;  Surgeon: Kieth Brightly, MD;  Location: [facility] ORS;  Service: General;  Laterality: Left;   CERVICAL CONE BIOPSY     DILATION AND CURETTAGE OF UTERUS     EXCISION / BIOPSY BREAST / NIPPLE / DUCT Left 08/17/2016   complex sclerosing lesion removed   LIPOMA EXCISION Right    benign tumor right leg   LYMPH NODE BIOPSY     TONSILLECTOMY     Social History:  reports that she has been smoking cigarettes. She has never used smokeless tobacco. She reports current alcohol use. She reports that she does not use drugs.  Allergies  Allergen Reactions   Buspar [Buspirone] Other (See Comments)    Made her "feel crazy"    Family History  Problem Relation Age of Onset   Cancer Mother        melanoma   Cancer Father        Lung Cancer   Early death Brother    Heart disease Brother    Breast cancer Paternal Aunt 42   Colon cancer Other    Ovarian cancer Neg Hx    Diabetes Neg Hx     Prior to Admission medications   Medication Sig Start Date End Date Taking? Authorizing Provider  albuterol (VENTOLIN HFA) 108 (90 Base)  MCG/ACT inhaler Inhale 2 puffs into the lungs every 6 (six) hours as needed for shortness of breath. 06/14/23   [provider]  citalopram (CELEXA) 40 MG tablet Take 1 tablet (40 mg total) by mouth daily. 06/28/23   Scoggins, Amber, NP  fluticasone furoate-vilanterol (BREO ELLIPTA) 100-25 MCG/ACT AEPB Inhale 1 puff into the lungs daily. 06/29/23   Scoggins, Amber, NP  ipratropium-albuterol (DUONEB) 0.5-2.5 (3) MG/3ML SOLN Take 3 mLs by nebulization every 6 (six) hours as needed. 01/10/23   Orson Eva, NP  ramipril (ALTACE) 10 MG capsule Take 1 capsule (10 mg total) by mouth daily. 04/05/23   Miki Kins, FNP  rosuvastatin (CRESTOR) 10 MG tablet Take 1 tablet (10 mg total) by mouth daily. 06/28/23   Marisue Ivan,  NP    Physical Exam: Vitals:   09/19/23 1230 09/19/23 1330 09/19/23 1402 09/19/23 1415  BP: (!) 153/88 127/86  (!) 144/76  Pulse: 94 89  (!) 104  Resp:    20  Temp:      TempSrc:      SpO2:   95% 96%  Height:         Physical Exam Constitutional:      Appearance: She is normal weight.     Comments: Mild increased WOB    HENT:     Head: Normocephalic.     Nose: Nose normal.     Mouth/Throat:     Mouth: Mucous membranes are moist.  Cardiovascular:     Rate and Rhythm: Normal rate and regular rhythm.  Pulmonary:     Breath sounds: Wheezing present.  Abdominal:     General: Bowel sounds are normal.  Musculoskeletal:        General: Normal range of motion.  Skin:    General: Skin is warm.  Neurological:     General: No focal deficit present.     Mental Status: She is oriented to person, place, and time.       Data Reviewed:  There are no new results to review at this time.  CT Angio Chest PE W and/or Wo Contrast CLINICAL DATA:  Acute shortness of breath.  History of COPD.  EXAM: CT ANGIOGRAPHY CHEST WITH CONTRAST  TECHNIQUE: Multidetector CT imaging of the chest was performed using the standard protocol during bolus administration of intravenous contrast. Multiplanar CT image reconstructions and MIPs were obtained to evaluate the vascular anatomy.  RADIATION DOSE REDUCTION: This exam was performed according to the departmental dose-optimization program which includes automated exposure control, adjustment of the mA and/or kV according to patient size and/or use of iterative reconstruction technique.  CONTRAST:  75mL OMNIPAQUE IOHEXOL 350 MG/ML SOLN  COMPARISON:  CT of the chest on 07/07/2005  FINDINGS: Cardiovascular: The pulmonary arteries are adequately opacified. There is no evidence of pulmonary embolism. Central pulmonary arteries are of normal caliber. The thoracic aorta is normal in caliber. Mild aortic atherosclerosis. The heart size is  normal. No pericardial fluid identified. Scattered calcified coronary artery plaque present.  Mediastinum/Nodes: No enlarged mediastinal, hilar, or axillary lymph nodes. Thyroid gland, trachea, and esophagus demonstrate no significant findings.  Lungs/Pleura: Moderate emphysematous lung disease with mild scattered pulmonary scarring bilaterally. There is no evidence of pulmonary edema, consolidation, pneumothorax, nodule or pleural fluid.  Upper Abdomen: Evidence of hepatic steatosis.  Musculoskeletal: No chest wall abnormality. No acute or significant osseous findings.  Review of the MIP images confirms the above findings.  IMPRESSION: 1. No evidence of pulmonary embolism or other acute findings  in the chest. 2. Moderate emphysematous lung disease with mild scattered pulmonary scarring bilaterally. 3. Hepatic steatosis. 4. Aortic atherosclerosis.  Aortic Atherosclerosis (ICD10-I70.0) and Emphysema (ICD10-J43.9).  Electronically Signed   By: Irish Lack M.D.   On: 09/19/2023 15:49 DG Chest Portable 1 View CLINICAL DATA:  Shortness of breath.  EXAM: PORTABLE CHEST 1 VIEW  COMPARISON:  06/20/2023.  FINDINGS: Bilateral lung fields are clear. Bilateral costophrenic angles are clear.  Normal cardio-mediastinal silhouette.  No acute osseous abnormalities.  The soft tissues are within normal limits.  IMPRESSION: No active disease.  Electronically Signed   By: Jules Schick M.D.   On: 09/19/2023 13:48  Lab Results  Component Value Date   WBC 6.5 09/19/2023   HGB 14.7 09/19/2023   HCT 41.7 09/19/2023   MCV 94.8 09/19/2023   PLT 446 (H) 09/19/2023   Last metabolic panel Lab Results  Component Value Date   GLUCOSE 150 (H) 09/19/2023   NA 128 (L) 09/19/2023   K 4.3 09/19/2023   CL 93 (L) 09/19/2023   CO2 23 09/19/2023   BUN 6 (L) 09/19/2023   CREATININE 0.60 09/19/2023   GFRNONAA >60 09/19/2023   CALCIUM 9.4 09/19/2023   PHOS 3.8 06/21/2017    PROT 7.0 09/19/2023   ALBUMIN 4.1 09/19/2023   LABGLOB 1.8 01/09/2023   AGRATIO 2.3 (H) 01/09/2023   BILITOT 0.5 09/19/2023   ALKPHOS 104 09/19/2023   AST 34 09/19/2023   ALT 26 09/19/2023   ANIONGAP 12 09/19/2023    Assessment and Plan: * Acute respiratory failure with hypoxia (HCC) Decompensated respiratory failure requiring BiPAP in the setting of active COPD exacerbation CTA of the chest negative for PE but does show diffuse emphysematous changes Status post IV Solu-Medrol Continue with DuoNebs as well as IV Rocephin Continue BiPAP Supplemental oxygen as needed Monitor respiratory status  NSTEMI (non-ST elevated myocardial infarction) (HCC) Troponin 180s in the setting of decompensated respiratory failure with hypoxia and COPD exacerbation No active chest pain EKG normal sinus rhythm Suspect secondary demand ischemia Status post full dose aspirin Troponin downtrending 2D echo Risk stratification labs Cardiology consult as clinic indicated   COPD exacerbation (HCC) Active COPD exacerbation associated with decompensated respiratory failure requiring BiPAP IV Solu-Medrol DuoNebs IV Rocephin Continue supplemental oxygen Monitor  Hyponatremia Sodium 128 on presentation Clinically dry IV fluid hydration Check urine and serum studies Goal 6-8 mill equivalent change over the next 12 to 24 hours Monitor  Mixed hyperlipidemia Continue statin  Essential hypertension, benign BP stable Titrate home regimen  Tobacco abuse 1 pack/day smoker Nicotine patch Monitor   Greater than 50% was spent in counseling and coordination of care with patient Total encounter time including critical care evaluation 80 minutes or more    Advance Care Planning:   Code Status: Full Code   Consults: None   Family Communication: Family at the bedside   Severity of Illness: The appropriate patient status for this patient is INPATIENT. Inpatient status is judged to be reasonable  and necessary in order to provide the required intensity of service to ensure the patient's safety. The patient's presenting symptoms, physical exam findings, and initial radiographic and laboratory data in the context of their chronic comorbidities is felt to place them at high risk for further clinical deterioration. Furthermore, it is not anticipated that the patient will be medically stable for discharge from the hospital within 2 midnights of admission.   * I certify that at the point of admission it is my clinical judgment  that the patient will require inpatient hospital care spanning beyond 2 midnights from the point of admission due to high intensity of service, high risk for further deterioration and high frequency of surveillance required.*  Author: Floydene Flock, MD 09/19/2023 4:01 PM  For on call review [email or web address].

## 2023-09-19 NOTE — ED Triage Notes (Signed)
Pt arrives via [facility] from home for acute SOB. Per EMS, pt was in tripod position speaking 1-2 words at a time when they arrived. She received duoneb prior to loading into the ambulance. Pt placed on CPAP in ambulance and given additional duoneb and 125mg  solumedrol IVP and 2g magnesium. Pt arrives on CPAP, alert and tolerating CPAP without problems. Pt has hx of COPD but does not wear home O2.

## 2023-09-19 NOTE — ED Provider Notes (Signed)
[facility] Provider Note    Event Date/Time   First MD Initiated Contact with Patient 09/19/23 1108     (approximate)   History   Shortness of Breath   HPI  Christine Sparks is a 62 y.o. female who presents to the emergency department via EMS for breathing difficulty.  The patient states that she has been short of breath for the past 3 days.  Does have a history of COPD and has been trying albuterol for pain.  Today the breathing got worse.  When EMS arrived they found the patient tripoding in respiratory distress.  They did place patient on CPAP.  They administered Solu-Medrol as well as breathing treatments.  They do feel patient has clinically improved.  Patient denies any chest pain.  States she has had some cough.  No fevers. No known sick contacts.      Physical Exam   Triage Vital Signs: ED Triage Vitals  Encounter Vitals Group     BP 09/19/23 1058 (!) 156/90     Systolic BP Percentile --      Diastolic BP Percentile --      Pulse Rate 09/19/23 1058 93     Resp 09/19/23 1058 (!) 28     Temp 09/19/23 1113 97.9 F (36.6 C)     Temp Source 09/19/23 1113 Axillary     SpO2 09/19/23 1058 98 %     Weight --      Height 09/19/23 1101 5\' 4"  (1.626 m)     Head Circumference --      Peak Flow --      Pain Score 09/19/23 1058 0     Pain Loc --      Pain Education --      Exclude from Growth Chart --     Most recent vital signs: Vitals:   09/19/23 1112 09/19/23 1113  BP:    Pulse: 88   Resp: (!) 23   Temp:  97.9 F (36.6 C)  SpO2: 100%    General: Awake, alert, oriented. CV:  Good peripheral perfusion. Regular rate and rhythm. Resp:  Tachypnea, increased work of breathing. Poor air movement. Abd:  No distention.  Other:  No lower extremity edema.    ED Results / Procedures / Treatments   Labs (all labs ordered are listed, but only abnormal results are displayed) Labs Reviewed  RESP PANEL BY RT-PCR (RSV, FLU A&B, COVID)  [id number]  CBC  WITH DIFFERENTIAL/PLATELET  COMPREHENSIVE METABOLIC PANEL  TROPONIN I (HIGH SENSITIVITY)     EKG  I, Phineas Semen, attending physician, personally viewed and interpreted this EKG  EKG Time: 1104 Rate: 88 Rhythm: sinus rhythm Axis: normal Intervals: qtc 459 QRS: narrow ST changes: no st elevation Impression: normal ekg   RADIOLOGY I independently interpreted and visualized the CXR. My interpretation: No pneumonia Radiology interpretation:  IMPRESSION:  No active disease.      PROCEDURES:  Critical Care performed: Yes  CRITICAL CARE Performed by: Phineas Semen   Total critical care time: 30 minutes  Critical care time was exclusive of separately billable procedures and treating other patients.  Critical care was necessary to treat or prevent imminent or life-threatening deterioration.  Critical care was time spent personally by me on the following activities: development of treatment plan with patient and/or surrogate as well as nursing, discussions with consultants, evaluation of patient's response to treatment, examination of patient, obtaining history from patient or surrogate, ordering and performing treatments and  interventions, ordering and review of laboratory studies, ordering and review of radiographic studies, pulse oximetry and re-evaluation of patient's condition.   Procedures    MEDICATIONS ORDERED IN ED: Medications - No data to display   IMPRESSION / MDM / ASSESSMENT AND PLAN / ED COURSE  I reviewed the triage vital signs and the nursing notes.                              Differential diagnosis includes, but is not limited to, COPD, pneumonia, viral illness, PE  Patient's presentation is most consistent with acute presentation with potential threat to life or bodily function.   The patient is on the cardiac monitor to evaluate for evidence of arrhythmia and/or significant heart rate changes.  Patient presented to the emergency  department today because of concerns for difficulty breathing.  Patient was placed on CPAP by EMS and this was transferred furred over to BiPAP upon arrival to the emergency department.  On exam patient does have tachypnea and increased work of breathing.  Poor air movement throughout lung fields.  Will check blood work as well as chest x-ray and viral panel.  Will give further breathing treatments here in the emergency department.  Patient's breathing did improve with bipap. CXR without pneumonia. Did have slight elevation of troponin. Will check CTAPE.  CTAPE pending at time of sign out.      FINAL CLINICAL IMPRESSION(S) / ED DIAGNOSES   Final diagnoses:  Shortness of breath  Elevated troponin     Note:  This document was prepared using Dragon voice recognition software and may include unintentional dictation errors.    Phineas Semen, MD 09/20/23 1556

## 2023-09-19 NOTE — Progress Notes (Signed)
RN called and stated patient had to be placed back on bipap due to shortness of breath.

## 2023-09-19 NOTE — Assessment & Plan Note (Signed)
1 pack/day smoker Nicotine patch Monitor

## 2023-09-19 NOTE — Assessment & Plan Note (Signed)
Troponin 180s in the setting of decompensated respiratory failure with hypoxia and COPD exacerbation No active chest pain EKG normal sinus rhythm Suspect secondary demand ischemia Status post full dose aspirin Troponin downtrending 2D echo Risk stratification labs Cardiology consult as clinic indicated

## 2023-09-19 NOTE — Assessment & Plan Note (Signed)
Sodium 128 on presentation Clinically dry IV fluid hydration Check urine and serum studies Goal 6-8 mill equivalent change over the next 12 to 24 hours Monitor

## 2023-09-19 NOTE — Assessment & Plan Note (Signed)
BP stable Titrate home regimen 

## 2023-09-19 NOTE — Assessment & Plan Note (Signed)
Decompensated respiratory failure requiring BiPAP in the setting of active COPD exacerbation CTA of the chest negative for PE but does show diffuse emphysematous changes Status post IV Solu-Medrol Continue with DuoNebs as well as IV Rocephin Continue BiPAP Supplemental oxygen as needed Monitor respiratory status

## 2023-09-19 NOTE — Progress Notes (Signed)
Pt taken off bipap and placed on 4lpm [place]. Tolerating at this time. RN aware.

## 2023-09-19 NOTE — Assessment & Plan Note (Signed)
Active COPD exacerbation associated with decompensated respiratory failure requiring BiPAP IV Solu-Medrol DuoNebs IV Rocephin Continue supplemental oxygen Monitor

## 2023-09-20 ENCOUNTER — Inpatient Hospital Stay (HOSPITAL_COMMUNITY)
Admit: 2023-09-20 | Discharge: 2023-09-20 | Disposition: A | Discharge status: Home / Self Care | Payer: Managed Care, Other (non HMO) | Attending: Family Medicine | Admitting: Family Medicine

## 2023-09-20 DIAGNOSIS — J9601 Acute respiratory failure with hypoxia: Secondary | ICD-10-CM | POA: Diagnosis not present

## 2023-09-20 DIAGNOSIS — I214 Non-ST elevation (NSTEMI) myocardial infarction: Secondary | ICD-10-CM | POA: Diagnosis not present

## 2023-09-20 LAB — COMPREHENSIVE METABOLIC PANEL
ALT: 19 U/L (ref 0–44)
AST: 28 U/L (ref 15–41)
Albumin: 3.3 g/dL — ABNORMAL LOW (ref 3.5–5.0)
Alkaline Phosphatase: 75 U/L (ref 38–126)
Anion gap: 12 (ref 5–15)
BUN: 9 mg/dL (ref 8–23)
CO2: 19 mmol/L — ABNORMAL LOW (ref 22–32)
Calcium: 8.8 mg/dL — ABNORMAL LOW (ref 8.9–10.3)
Chloride: 99 mmol/L (ref 98–111)
Creatinine, Ser: 0.61 mg/dL (ref 0.44–1.00)
GFR, Estimated: >60 mL/min (ref >60)
Glucose, Bld: 109 mg/dL — ABNORMAL HIGH (ref 70–99)
Potassium: 4.4 mmol/L (ref 3.5–5.1)
Sodium: 130 mmol/L — ABNORMAL LOW (ref 135–145)
Total Bilirubin: 0.5 mg/dL (ref <1.2)
Total Protein: 5.9 g/dL — ABNORMAL LOW (ref 6.5–8.1)

## 2023-09-20 LAB — CBC
HCT: 38.4 % (ref 36.0–46.0)
Hemoglobin: 13 g/dL (ref 12.0–15.0)
MCH: 33.1 pg (ref 26.0–34.0)
MCHC: 33.9 g/dL (ref 30.0–36.0)
MCV: 97.7 fL (ref 80.0–100.0)
Platelets: 387 10*3/uL (ref 150–400)
RBC: 3.93 MIL/uL (ref 3.87–5.11)
RDW: 12.6 % (ref 11.5–15.5)
WBC: 7 10*3/uL (ref 4.0–10.5)
nRBC: 0 % (ref 0.0–0.2)

## 2023-09-20 LAB — ECHOCARDIOGRAM COMPLETE
AR max vel: 1.85 cm2
AV Area VTI: 2.03 cm2
AV Area mean vel: 1.72 cm2
AV Mean grad: 6 mm[Hg]
AV Peak grad: 12.8 mm[Hg]
Ao pk vel: 1.79 m/s
Area-P 1/2: 3.93 cm2
Height: 64 in
MV VTI: 2.19 cm2
S' Lateral: 2.7 cm

## 2023-09-20 LAB — TROPONIN I (HIGH SENSITIVITY): Troponin I (High Sensitivity): 34 ng/L — ABNORMAL HIGH (ref <18)

## 2023-09-20 MED ORDER — LORAZEPAM 0.5 MG PO TABS
0.5000 mg | ORAL_TABLET | Freq: Once | ORAL | Status: AC
  Administered 2023-09-20: 0.5 mg via ORAL
  Filled 2023-09-20: qty 1

## 2023-09-20 MED ORDER — IPRATROPIUM-ALBUTEROL 0.5-2.5 (3) MG/3ML IN SOLN
3.0000 mL | Freq: Four times a day (QID) | RESPIRATORY_TRACT | Status: DC | PRN
  Administered 2023-09-20 – 2023-09-21 (×3): 3 mL via RESPIRATORY_TRACT
  Filled 2023-09-20 (×4): qty 3

## 2023-09-20 MED ORDER — IPRATROPIUM-ALBUTEROL 0.5-2.5 (3) MG/3ML IN SOLN
RESPIRATORY_TRACT | Status: AC
  Filled 2023-09-20: qty 3

## 2023-09-20 NOTE — Progress Notes (Signed)
Progress Note   Patient: Christine Sparks XBJ:[id number] DOB: November 23, 1960 DOA: 09/19/2023     1 DOS: the patient was seen and examined on 09/20/2023   Brief hospital course: Christine Sparks is a 62 y.o. female with medical history significant of tobacco abuse, hypertension, hyperlipidemia, COPD presenting with acute respiratory failure with hypoxia, COPD exacerbation, NSTEMI, hyponatremia.  Patient reports increased work of breathing for the past 3 days.  Does not wear oxygen at home.  Smoking up to 1 pack/day up until 3 days ago.  No chest pain.  No nausea or vomiting.  No focal hemiparesis or confusion.  Has had increased cough, wheezing, sputum production.  No orthopnea PND.  No focal hemiparesis or confusion.  Has been using home inhalers with minimal improvement in symptoms. Presented to the ER afebrile, heart rate into the 100s, BP stable.  Initially placed on BiPAP to help with respiratory support.  White count 6.5, hemoglobin 14.7, platelets 446.  Troponin 1 83-1 53.  Creatinine 0.6, glucose 150.  Sodium 128.  EKG normal sinus rhythm.  CT of the chest negative for PE.  Positive emphysematous lung changes.  Assessment and Plan:  Acute respiratory failure with hypoxia (HCC) Decompensated respiratory failure requiring BiPAP in the setting of active COPD exacerbation CTA of the chest negative for PE but does show diffuse emphysematous changes Status post IV Solu-Medrol Continue with DuoNebs as well as IV Rocephin Initially required BiPAP however currently off BiPAP Supplemental oxygen as needed Monitor respiratory status   Elevated troponin secondary to demand ischemia in the setting of acute respiratory failure Troponin slightly elevated however remained flat No complaints of chest pain, EKG with no ischemic findings Follow-up on echocardiogram If patient were to develop acute chest pain or ischemic symptoms we will consult cardiology     COPD exacerbation (HCC) Active COPD exacerbation  associated with decompensated respiratory failure requiring BiPAP Continue IV Solu-Medrol Continue DuoNebs Continue IV Rocephin Continue supplemental oxygen Monitor   Hyponatremia Sodium 128 on presentation Clinically dry IV fluid hydration Follow-up on urine and serum studies    Mixed hyperlipidemia Continue statin therapy   Essential hypertension, benign BP stable Titrate home regimen   Tobacco abuse 1 pack/day smoker Nicotine patch Monitor     Greater than 50% was spent in counseling and coordination of care with patient Total encounter time including critical care evaluation 80 minutes or more      Advance Care Planning:   Code Status: Full Code    Consults: None    Family Communication: Family at the bedside    Subjective:  Patient seen and examined at bedside this morning Admits to improvement in respiratory function She is currently on 3 L of intranasal oxygen but does not use any at home  Physical Exam: HENT:     Head: Normocephalic.     Nose: Nose normal.     Mouth/Throat:     Mouth: Mucous membranes are moist.  Cardiovascular:     Rate and Rhythm: Normal rate and regular rhythm.  Pulmonary: Bilateral wheezing noted Abdominal:     General: Bowel sounds are normal.  Musculoskeletal:        General: Normal range of motion.  Skin:    General: Skin is warm.  Neurological:     General: No focal deficit present.     Mental Status: She is oriented to person, place, and time.    Vitals:   09/20/23 1200 09/20/23 1500 09/20/23 1530 09/20/23 1601  BP:  107/67  128/72   Pulse:  70    Resp:      Temp:      TempSrc:      SpO2: 96%   97%  Height:        Data Reviewed:    Latest Ref Rng & Units 09/20/2023    5:50 AM 09/19/2023   11:02 AM 06/21/2023    3:15 AM  BMP  Glucose 70 - 99 mg/dL 578  469  629   BUN 8 - 23 mg/dL 9  6  9    Creatinine 0.44 - 1.00 mg/dL 5.28  4.13  2.44   Sodium 135 - 145 mmol/L 130  128  129   Potassium 3.5 - 5.1 mmol/L  4.4  4.3  4.7   Chloride 98 - 111 mmol/L 99  93  95   CO2 22 - 32 mmol/L 19  23  24    Calcium 8.9 - 10.3 mg/dL 8.8  9.4  9.0        Latest Ref Rng & Units 09/20/2023    5:50 AM 09/19/2023   11:02 AM 06/21/2023    3:15 AM  CBC  WBC 4.0 - 10.5 K/uL 7.0  6.5  4.5   Hemoglobin 12.0 - 15.0 g/dL 01.0  27.2  53.6   Hematocrit 36.0 - 46.0 % 38.4  41.7  36.3   Platelets 150 - 400 K/uL 387  446  431       Disposition: Status is: Inpatient  Time spent: 57 minutes  Author: Loyce Dys, MD 09/20/2023 5:29 PM  For on call review [email or web address].

## 2023-09-20 NOTE — ED Notes (Signed)
PT cousin Christin Bach called at [phone]. Gave her an update, okay with pt. Call stephaine when pt gets a room upstairs

## 2023-09-20 NOTE — ED Notes (Addendum)
Hospitalist messaged due to pt requesting a nebulizer. See orders.

## 2023-09-20 NOTE — ED Notes (Signed)
Pt given her phone, denies other needs at this time

## 2023-09-20 NOTE — ED Notes (Signed)
ED TO INPATIENT HANDOFF REPORT  ED Nurse Name and Phone #: Jess F   S Name/Age/Gender Christine Sparks 62 y.o. female Room/Bed: ED36A/ED36A  Code Status   Code Status: Full Code  Home/SNF/Other Home Patient oriented to: self, place, time, and situation Is this baseline? Yes   Triage Complete: Triage complete  Chief Complaint Acute respiratory failure with hypoxia (HCC) [J96.01]  Triage Note Pt arrives via [facility] from home for acute SOB. Per EMS, pt was in tripod position speaking 1-2 words at a time when they arrived. She received duoneb prior to loading into the ambulance. Pt placed on CPAP in ambulance and given additional duoneb and 125mg  solumedrol IVP and 2g magnesium. Pt arrives on CPAP, alert and tolerating CPAP without problems. Pt has hx of COPD but does not wear home O2.    Allergies Allergies  Allergen Reactions   Zithromax [Azithromycin] Nausea And Vomiting   Buspar [Buspirone] Other (See Comments)    Made her "feel crazy"    Level of Care/Admitting Diagnosis ED Disposition     ED Disposition  Admit   Condition  --   Comment  Hospital Area: [facility] [[id number]]  Level of Care: Progressive [102]  Admit to Progressive based on following criteria: RESPIRATORY PROBLEMS hypoxemic/hypercapnic respiratory failure that is responsive to NIPPV (BiPAP) or High Flow Nasal Cannula (6-80 lpm). Frequent assessment/intervention, no > Q2 hrs < Q4 hrs, to maintain oxygenation and pulmonary hygiene.  Covid Evaluation: Confirmed COVID Negative  Diagnosis: Acute respiratory failure with hypoxia [facility]) [[id number]]  Admitting Physician: Floydene Flock [[id number]]  Attending Physician: Floydene Flock [phone]  Certification:: I certify this patient will need inpatient services for at least 2 midnights          B Medical/Surgery History Past Medical History:  Diagnosis Date   Anemia    Anxiety    Arthritis    Bronchitis, chronic obstructive (HCC)     Depression    Diabetes mellitus jan 2012   a1c 5.7, fasting glu 155   Dyspnea    with exertion   H/O bronchitis    Heart murmur    High cholesterol    Hypertension    Menopausal syndrome    Tobacco abuse    Past Surgical History:  Procedure Laterality Date   BREAST BIOPSY Left 07/26/2016   complex sclerosing lesion. Coil shape marker   BREAST LUMPECTOMY Left 08/17/2016   complex sclerosing lesion with focal ADH. Clear margins. The coil shaped biopsy marker remains    BREAST LUMPECTOMY WITH NEEDLE LOCALIZATION Left 08/17/2016   Procedure: BREAST LUMPECTOMY WITH NEEDLE LOCALIZATION;  Surgeon: Kieth Brightly, MD;  Location: [facility] ORS;  Service: General;  Laterality: Left;   CERVICAL CONE BIOPSY     DILATION AND CURETTAGE OF UTERUS     EXCISION / BIOPSY BREAST / NIPPLE / DUCT Left 08/17/2016   complex sclerosing lesion removed   LIPOMA EXCISION Right    benign tumor right leg   LYMPH NODE BIOPSY     TONSILLECTOMY       A IV Location/Drains/Wounds Patient Lines/Drains/Airways Status     Active Line/Drains/Airways     Name Placement date Placement time Site Days   Peripheral IV 09/19/23 20 G Anterior;Distal;Right Forearm 09/19/23  --  Forearm  1   Peripheral IV 09/19/23 20 G Anterior;Left;Proximal Forearm 09/19/23  --  Forearm  1   External Urinary Catheter 09/19/23  1530  --  1  Intake/Output Last 24 hours  Intake/Output Summary (Last 24 hours) at 09/20/2023 1930 Last data filed at 09/20/2023 1641 Gross per 24 hour  Intake 2100 ml  Output 800 ml  Net 1300 ml    Labs/Imaging Results for orders placed or performed during the hospital encounter of 09/19/23 (from the past 48 hours)  CBC with Differential     Status: Abnormal   Collection Time: 09/19/23 11:02 AM  Result Value Ref Range   WBC 6.5 4.0 - 10.5 K/uL   RBC 4.40 3.87 - 5.11 MIL/uL   Hemoglobin 14.7 12.0 - 15.0 g/dL   HCT 16.1 09.6 - 04.5 %   MCV 94.8 80.0 - 100.0 fL   MCH 33.4 26.0 -  34.0 pg   MCHC 35.3 30.0 - 36.0 g/dL   RDW 40.9 81.1 - 91.4 %   Platelets 446 (H) 150 - 400 K/uL   nRBC 0.0 0.0 - 0.2 %   Neutrophils Relative % 75 %   Neutro Abs 4.9 1.7 - 7.7 K/uL   Lymphocytes Relative 16 %   Lymphs Abs 1.0 0.7 - 4.0 K/uL   Monocytes Relative 7 %   Monocytes Absolute 0.5 0.1 - 1.0 K/uL   Eosinophils Relative 1 %   Eosinophils Absolute 0.0 0.0 - 0.5 K/uL   Basophils Relative 1 %   Basophils Absolute 0.1 0.0 - 0.1 K/uL   Immature Granulocytes 0 %   Abs Immature Granulocytes 0.02 0.00 - 0.07 K/uL    Comment: Performed at [facility], [address] [place], [place] [address]  Comprehensive metabolic panel     Status: Abnormal   Collection Time: 09/19/23 11:02 AM  Result Value Ref Range   Sodium 128 (L) 135 - 145 mmol/L   Potassium 4.3 3.5 - 5.1 mmol/L   Chloride 93 (L) 98 - 111 mmol/L   CO2 23 22 - 32 mmol/L   Glucose, Bld 150 (H) 70 - 99 mg/dL    Comment: Glucose reference range applies only to samples taken after fasting for at least 8 hours.   BUN 6 (L) 8 - 23 mg/dL   Creatinine, Ser 6.21 0.44 - 1.00 mg/dL   Calcium 9.4 8.9 - 30.8 mg/dL   Total Protein 7.0 6.5 - 8.1 g/dL   Albumin 4.1 3.5 - 5.0 g/dL   AST 34 15 - 41 U/L   ALT 26 0 - 44 U/L   Alkaline Phosphatase 104 38 - 126 U/L   Total Bilirubin 0.5 <1.2 mg/dL   GFR, Estimated >65 >78 mL/min    Comment: (NOTE) Calculated using the CKD-EPI Creatinine Equation (2021)    Anion gap 12 5 - 15    Comment: Performed at [facility], [address] [place], [place] [address]  Troponin I (High Sensitivity)     Status: Abnormal   Collection Time: 09/19/23 11:02 AM  Result Value Ref Range   Troponin I (High Sensitivity) 183 (HH) <18 ng/L    Comment: CRITICAL RESULT CALLED TO, READ BACK BY AND VERIFIED WITH HEATHER GREEN RN 1201 09/19/23 HNM (NOTE) Elevated high sensitivity troponin I (hsTnI) values and significant  changes across serial measurements may suggest ACS but many other   chronic and acute conditions are known to elevate hsTnI results.  Refer to the "Links" section for chest pain algorithms and additional  guidance. Performed at [facility], [address] [place], [place] [address]   Resp panel by RT-PCR (RSV, Flu A&B, Covid) Anterior Nasal Swab  Status: None   Collection Time: 09/19/23 11:02 AM   Specimen: Anterior Nasal Swab  Result Value Ref Range   SARS Coronavirus 2 by RT PCR NEGATIVE NEGATIVE    Comment: (NOTE) SARS-CoV-2 target nucleic acids are NOT DETECTED.  The SARS-CoV-2 RNA is generally detectable in upper respiratory specimens during the acute phase of infection. The lowest concentration of SARS-CoV-2 viral copies this assay can detect is 138 copies/mL. A negative result does not preclude SARS-Cov-2 infection and should not be used as the sole basis for treatment or other patient management decisions. A negative result may occur with  improper specimen collection/handling, submission of specimen other than nasopharyngeal swab, presence of viral mutation(s) within the areas targeted by this assay, and inadequate number of viral copies(<138 copies/mL). A negative result must be combined with clinical observations, patient history, and epidemiological information. The expected result is Negative.  Fact Sheet for Patients:  BloggerCourse.com  Fact Sheet for Healthcare Providers:  SeriousBroker.it  This test is no t yet approved or cleared by the [place] FDA and  has been authorized for detection and/or diagnosis of SARS-CoV-2 by FDA under an Emergency Use Authorization (EUA). This EUA will remain  in effect (meaning this test can be used) for the duration of the COVID-19 declaration under Section 564(b)(1) of the Act, 21 U.S.C.section 360bbb-3(b)(1), unless the authorization is terminated  or revoked sooner.       Influenza A by PCR NEGATIVE NEGATIVE    Influenza B by PCR NEGATIVE NEGATIVE    Comment: (NOTE) The Xpert Xpress SARS-CoV-2/FLU/RSV plus assay is intended as an aid in the diagnosis of influenza from Nasopharyngeal swab specimens and should not be used as a sole basis for treatment. Nasal washings and aspirates are unacceptable for Xpert Xpress SARS-CoV-2/FLU/RSV testing.  Fact Sheet for Patients: BloggerCourse.com  Fact Sheet for Healthcare Providers: SeriousBroker.it  This test is not yet approved or cleared by the [place] FDA and has been authorized for detection and/or diagnosis of SARS-CoV-2 by FDA under an Emergency Use Authorization (EUA). This EUA will remain in effect (meaning this test can be used) for the duration of the COVID-19 declaration under Section 564(b)(1) of the Act, 21 U.S.C. section 360bbb-3(b)(1), unless the authorization is terminated or revoked.     Resp Syncytial Virus by PCR NEGATIVE NEGATIVE    Comment: (NOTE) Fact Sheet for Patients: BloggerCourse.com  Fact Sheet for Healthcare Providers: SeriousBroker.it  This test is not yet approved or cleared by the [place] FDA and has been authorized for detection and/or diagnosis of SARS-CoV-2 by FDA under an Emergency Use Authorization (EUA). This EUA will remain in effect (meaning this test can be used) for the duration of the COVID-19 declaration under Section 564(b)(1) of the Act, 21 U.S.C. section 360bbb-3(b)(1), unless the authorization is terminated or revoked.  Performed at [facility], [address] [place], [place] [address]   Brain natriuretic peptide     Status: Abnormal   Collection Time: 09/19/23 11:02 AM  Result Value Ref Range   B Natriuretic Peptide 137.3 (H) 0.0 - 100.0 pg/mL    Comment: Performed at [facility], [address] [place], [place] [address]  Troponin I (High Sensitivity)      Status: Abnormal   Collection Time: 09/19/23 12:55 PM  Result Value Ref Range   Troponin I (High Sensitivity) 153 (HH) <18 ng/L    Comment: CRITICAL VALUE NOTED. VALUE IS CONSISTENT WITH PREVIOUSLY REPORTED/CALLED VALUE MU (NOTE) Elevated high sensitivity troponin  I (hsTnI) values and significant  changes across serial measurements may suggest ACS but many other  chronic and acute conditions are known to elevate hsTnI results.  Refer to the "Links" section for chest pain algorithms and additional  guidance. Performed at [facility], [address] [place], [place] [address]   Osmolality     Status: Abnormal   Collection Time: 09/19/23  4:37 PM  Result Value Ref Range   Osmolality 268 (L) 275 - 295 mOsm/kg    Comment: REPEATED TO VERIFY Performed at [facility], [address] [place], [place] [address]   HIV Antibody (routine testing w rflx)     Status: None   Collection Time: 09/19/23  4:47 PM  Result Value Ref Range   HIV Screen 4th Generation wRfx Non Reactive Non Reactive    Comment: Performed at [facility], [address] [place], [place] [address]  Troponin I (High Sensitivity)     Status: Abnormal   Collection Time: 09/19/23  4:47 PM  Result Value Ref Range   Troponin I (High Sensitivity) 108 (HH) <18 ng/L    Comment: CRITICAL VALUE NOTED. VALUE IS CONSISTENT WITH PREVIOUSLY REPORTED/CALLED VALUE MU (NOTE) Elevated high sensitivity troponin I (hsTnI) values and significant  changes across serial measurements may suggest ACS but many other  chronic and acute conditions are known to elevate hsTnI results.  Refer to the "Links" section for chest pain algorithms and additional  guidance. Performed at [facility], [address] [place], [place] [address]   CBC     Status: None   Collection Time: 09/20/23  5:50 AM  Result Value Ref Range   WBC 7.0 4.0 - 10.5 K/uL   RBC 3.93 3.87 - 5.11 MIL/uL   Hemoglobin 13.0 12.0 - 15.0  g/dL   HCT 16.6 06.3 - 01.6 %   MCV 97.7 80.0 - 100.0 fL   MCH 33.1 26.0 - 34.0 pg   MCHC 33.9 30.0 - 36.0 g/dL   RDW 01.0 93.2 - 35.5 %   Platelets 387 150 - 400 K/uL   nRBC 0.0 0.0 - 0.2 %    Comment: Performed at [facility], [address] [place], [place] [address]  Comprehensive metabolic panel     Status: Abnormal   Collection Time: 09/20/23  5:50 AM  Result Value Ref Range   Sodium 130 (L) 135 - 145 mmol/L   Potassium 4.4 3.5 - 5.1 mmol/L   Chloride 99 98 - 111 mmol/L   CO2 19 (L) 22 - 32 mmol/L   Glucose, Bld 109 (H) 70 - 99 mg/dL    Comment: Glucose reference range applies only to samples taken after fasting for at least 8 hours.   BUN 9 8 - 23 mg/dL   Creatinine, Ser 2.54 0.44 - 1.00 mg/dL   Calcium 8.8 (L) 8.9 - 10.3 mg/dL   Total Protein 5.9 (L) 6.5 - 8.1 g/dL   Albumin 3.3 (L) 3.5 - 5.0 g/dL   AST 28 15 - 41 U/L   ALT 19 0 - 44 U/L   Alkaline Phosphatase 75 38 - 126 U/L   Total Bilirubin 0.5 <1.2 mg/dL   GFR, Estimated >27 >06 mL/min    Comment: (NOTE) Calculated using the CKD-EPI Creatinine Equation (2021)    Anion gap 12 5 - 15    Comment: Performed at [facility], [address], [place], [place] [address]  Troponin I (High Sensitivity)  Status: Abnormal   Collection Time: 09/20/23  5:50 AM  Result Value Ref Range   Troponin I (High Sensitivity) 34 (H) <18 ng/L    Comment: (NOTE) Elevated high sensitivity troponin I (hsTnI) values and significant  changes across serial measurements may suggest ACS but many other  chronic and acute conditions are known to elevate hsTnI results.  Refer to the "Links" section for chest pain algorithms and additional  guidance. Performed at [facility], [address] [place] [place], [place] [address]    ECHOCARDIOGRAM COMPLETE Result Date: 09/20/2023    ECHOCARDIOGRAM REPORT   Patient Name:   BREKLYN BERTELLI Date of Exam: 09/20/2023 Medical Rec #:  [id number]     Height:       64.0 in  Accession #:    [id number]    Weight:       124.8 lb Date of Birth:  1961-03-10     BSA:          1.601 m Patient Age:    62 years      BP:           126/76 mmHg Patient Gender: F             HR:           69 bpm. Exam Location:  [facility] Procedure: 2D Echo, Cardiac Doppler and Color Doppler Indications:     NSTEMI  History:         Patient has no prior history of Echocardiogram examinations.                  Acute MI, COPD; Risk Factors:Hypertension, Diabetes, Current                  Smoker and Dyslipidemia. Breast CA.  Sonographer:     Mikki Harbor Referring Phys:  [phone] Francoise Schaumann [place] Diagnosing Phys: Julien Nordmann MD  Sonographer Comments: Image acquisition challenging due to COPD. IMPRESSIONS  1. Left ventricular ejection fraction, by estimation, is 60 to 65%. The left ventricle has normal function. The left ventricle has no regional wall motion abnormalities. Left ventricular diastolic parameters are indeterminate.  2. Right ventricular systolic function is normal. The right ventricular size is normal. Tricuspid regurgitation signal is inadequate for assessing PA pressure.  3. The mitral valve is normal in structure. No evidence of mitral valve regurgitation. No evidence of mitral stenosis.  4. The aortic valve is normal in structure. Aortic valve regurgitation is mild to moderate. Aortic valve sclerosis is present, with no evidence of aortic valve stenosis.  5. The inferior vena cava is normal in size with greater than 50% respiratory variability, suggesting right atrial pressure of 3 mmHg. FINDINGS  Left Ventricle: Left ventricular ejection fraction, by estimation, is 60 to 65%. The left ventricle has normal function. The left ventricle has no regional wall motion abnormalities. The left ventricular internal cavity size was normal in size. There is  no left ventricular hypertrophy. Left ventricular diastolic parameters are indeterminate. Right Ventricle: The right ventricular size is normal. No increase in  right ventricular wall thickness. Right ventricular systolic function is normal. Tricuspid regurgitation signal is inadequate for assessing PA pressure. Left Atrium: Left atrial size was normal in size. Right Atrium: Right atrial size was normal in size. Pericardium: There is no evidence of pericardial effusion. Mitral Valve: The mitral valve is normal in structure. No evidence of mitral valve regurgitation. No evidence of mitral valve stenosis. MV peak gradient, 6.0 mmHg. The mean  mitral valve gradient is 2.0 mmHg. Tricuspid Valve: The tricuspid valve is normal in structure. Tricuspid valve regurgitation is not demonstrated. No evidence of tricuspid stenosis. Aortic Valve: The aortic valve is normal in structure. Aortic valve regurgitation is mild to moderate. Aortic valve sclerosis is present, with no evidence of aortic valve stenosis. Aortic valve mean gradient measures 6.0 mmHg. Aortic valve peak gradient measures 12.8 mmHg. Aortic valve area, by VTI measures 2.03 cm. Pulmonic Valve: The pulmonic valve was normal in structure. Pulmonic valve regurgitation is not visualized. No evidence of pulmonic stenosis. Aorta: The aortic root is normal in size and structure. Venous: The inferior vena cava is normal in size with greater than 50% respiratory variability, suggesting right atrial pressure of 3 mmHg. IAS/Shunts: No atrial level shunt detected by color flow Doppler.  LEFT VENTRICLE PLAX 2D LVIDd:         4.50 cm   Diastology LVIDs:         2.70 cm   LV e' medial:    9.03 cm/s LV PW:         0.60 cm   LV E/e' medial:  8.9 LV IVS:        1.00 cm   LV e' lateral:   8.27 cm/s LVOT diam:     1.90 cm   LV E/e' lateral: 9.7 LV SV:         81 LV SV Index:   50 LVOT Area:     2.84 cm  RIGHT VENTRICLE RV Basal diam:  3.35 cm RV Mid diam:    2.90 cm RV S prime:     12.30 cm/s LEFT ATRIUM           Index        RIGHT ATRIUM          Index LA diam:      3.00 cm 1.87 cm/m   RA Area:     9.39 cm LA Vol (A2C): 44.6 ml 27.86  ml/m  RA Volume:   20.30 ml 12.68 ml/m LA Vol (A4C): 12.8 ml 7.99 ml/m  AORTIC VALVE                     PULMONIC VALVE AV Area (Vmax):    1.85 cm      PV Vmax:       0.80 m/s AV Area (Vmean):   1.72 cm      PV Peak grad:  2.6 mmHg AV Area (VTI):     2.03 cm AV Vmax:           179.00 cm/s AV Vmean:          114.000 cm/s AV VTI:            0.398 m AV Peak Grad:      12.8 mmHg AV Mean Grad:      6.0 mmHg LVOT Vmax:         117.00 cm/s LVOT Vmean:        69.300 cm/s LVOT VTI:          0.285 m LVOT/AV VTI ratio: 0.72  AORTA Ao Root diam: 3.10 cm MITRAL VALVE MV Area (PHT): 3.93 cm    SHUNTS MV Area VTI:   2.19 cm    Systemic VTI:  0.29 m MV Peak grad:  6.0 mmHg    Systemic Diam: 1.90 cm MV Mean grad:  2.0 mmHg MV Vmax:       1.22 m/s MV Vmean:  65.0 cm/s MV Decel Time: 193 msec MV E velocity: 80.20 cm/s MV A velocity: 58.70 cm/s MV E/A ratio:  1.37 Julien Nordmann MD Electronically signed by Julien Nordmann MD Signature Date/Time: 09/20/2023/5:16:43 PM    Final    CT Angio Chest PE W and/or Wo Contrast Result Date: 09/19/2023 CLINICAL DATA:  Acute shortness of breath.  History of COPD. EXAM: CT ANGIOGRAPHY CHEST WITH CONTRAST TECHNIQUE: Multidetector CT imaging of the chest was performed using the standard protocol during bolus administration of intravenous contrast. Multiplanar CT image reconstructions and MIPs were obtained to evaluate the vascular anatomy. RADIATION DOSE REDUCTION: This exam was performed according to the departmental dose-optimization program which includes automated exposure control, adjustment of the mA and/or kV according to patient size and/or use of iterative reconstruction technique. CONTRAST:  75mL OMNIPAQUE IOHEXOL 350 MG/ML SOLN COMPARISON:  CT of the chest on 07/07/2005 FINDINGS: Cardiovascular: The pulmonary arteries are adequately opacified. There is no evidence of pulmonary embolism. Central pulmonary arteries are of normal caliber. The thoracic aorta is normal in caliber.  Mild aortic atherosclerosis. The heart size is normal. No pericardial fluid identified. Scattered calcified coronary artery plaque present. Mediastinum/Nodes: No enlarged mediastinal, hilar, or axillary lymph nodes. Thyroid gland, trachea, and esophagus demonstrate no significant findings. Lungs/Pleura: Moderate emphysematous lung disease with mild scattered pulmonary scarring bilaterally. There is no evidence of pulmonary edema, consolidation, pneumothorax, nodule or pleural fluid. Upper Abdomen: Evidence of hepatic steatosis. Musculoskeletal: No chest wall abnormality. No acute or significant osseous findings. Review of the MIP images confirms the above findings. IMPRESSION: 1. No evidence of pulmonary embolism or other acute findings in the chest. 2. Moderate emphysematous lung disease with mild scattered pulmonary scarring bilaterally. 3. Hepatic steatosis. 4. Aortic atherosclerosis. Aortic Atherosclerosis (ICD10-I70.0) and Emphysema (ICD10-J43.9). Electronically Signed   By: Irish Lack M.D.   On: 09/19/2023 15:49   DG Chest Portable 1 View Result Date: 09/19/2023 CLINICAL DATA:  Shortness of breath. EXAM: PORTABLE CHEST 1 VIEW COMPARISON:  06/20/2023. FINDINGS: Bilateral lung fields are clear. Bilateral costophrenic angles are clear. Normal cardio-mediastinal silhouette. No acute osseous abnormalities. The soft tissues are within normal limits. IMPRESSION: No active disease. Electronically Signed   By: Jules Schick M.D.   On: 09/19/2023 13:48    Pending Labs Unresulted Labs (From admission, onward)     Start     Ordered   09/19/23 1601  Osmolality, urine  Once,   R        09/19/23 1600   09/19/23 1601  Sodium, urine, random  Once,   R        09/19/23 1600   09/19/23 1601  Creatinine, urine, random  Once,   R        09/19/23 1600            Vitals/Pain Today's Vitals   09/20/23 1800 09/20/23 1830 09/20/23 1845 09/20/23 1900  BP: (!) 145/83 126/69 126/69 128/68  Pulse: 65 66 67  65  Resp:  18    Temp:  97.8 F (36.6 C)    TempSrc:  Oral    SpO2: 100% 100% 98% 99%  Height:      PainSc:        Isolation Precautions No active isolations  Medications Medications  ramipril (ALTACE) capsule 10 mg (10 mg Oral Not Given 09/20/23 0945)  rosuvastatin (CRESTOR) tablet 10 mg (10 mg Oral Given 09/20/23 0942)  citalopram (CELEXA) tablet 40 mg (40 mg Oral Given 09/20/23 0941)  enoxaparin (LOVENOX) injection  40 mg (40 mg Subcutaneous Given 09/19/23 2206)  0.9 %  sodium chloride infusion (0 mLs Intravenous Stopped 09/20/23 1610)  ondansetron (ZOFRAN) tablet 4 mg (has no administration in time range)    Or  ondansetron (ZOFRAN) injection 4 mg (has no administration in time range)  cefTRIAXone (ROCEPHIN) 1 g in sodium chloride 0.9 % 100 mL IVPB (0 g Intravenous Stopped 09/20/23 1641)  methylPREDNISolone sodium succinate (SOLU-MEDROL) 40 mg/mL injection 40 mg (40 mg Intravenous Given 09/20/23 0756)    Followed by  predniSONE (DELTASONE) tablet 40 mg (has no administration in time range)  aspirin chewable tablet 81 mg (81 mg Oral Given 09/20/23 0941)  nicotine (NICODERM CQ - dosed in mg/24 hours) patch 21 mg (21 mg Transdermal Patch Applied 09/20/23 0938)  ipratropium-albuterol (DUONEB) 0.5-2.5 (3) MG/3ML nebulizer solution 3 mL (3 mLs Nebulization Given 09/20/23 1804)  ipratropium-albuterol (DUONEB) 0.5-2.5 (3) MG/3ML nebulizer solution 3 mL (3 mLs Nebulization Given 09/19/23 1136)  aspirin chewable tablet 324 mg (324 mg Oral Given 09/19/23 1250)  LORazepam (ATIVAN) injection 0.5 mg (0.5 mg Intravenous Given 09/19/23 1249)  iohexol (OMNIPAQUE) 350 MG/ML injection 75 mL (75 mLs Intravenous Contrast Given 09/19/23 1346)  LORazepam (ATIVAN) tablet 0.5 mg (0.5 mg Oral Given 09/20/23 1806)    Mobility walks     Focused Assessments Pulmonary Assessment Handoff:  Lung sounds: Bilateral Breath Sounds: Inspiratory wheezes O2 Device: Nasal Cannula O2 Flow Rate (L/min): 3  L/min    R Recommendations: See Admitting Provider Note  Report given to:   Additional Notes: n/a

## 2023-09-20 NOTE — ED Notes (Signed)
Pt given breakfast tray

## 2023-09-20 NOTE — ED Notes (Signed)
Pt resting in bed, pt states her breathing is better at this time

## 2023-09-20 NOTE — ED Notes (Signed)
Previous nicotine patch removed prior to new patch placed at 0938 09/20/23.

## 2023-09-20 NOTE — ED Notes (Signed)
Nebulizer started, pt having some increased difficulty breathing at this time.

## 2023-09-20 NOTE — Progress Notes (Signed)
*  PRELIMINARY RESULTS* Echocardiogram 2D Echocardiogram has been performed.  Carolyne Fiscal 09/20/2023, 4:09 PM

## 2023-09-21 ENCOUNTER — Encounter: Payer: Self-pay | Admitting: Family Medicine

## 2023-09-21 DIAGNOSIS — J9601 Acute respiratory failure with hypoxia: Secondary | ICD-10-CM | POA: Diagnosis not present

## 2023-09-21 LAB — CBC WITH DIFFERENTIAL/PLATELET
Abs Immature Granulocytes: 0.07 10*3/uL (ref 0.00–0.07)
Basophils Absolute: 0 10*3/uL (ref 0.0–0.1)
Basophils Relative: 0 %
Eosinophils Absolute: 0 10*3/uL (ref 0.0–0.5)
Eosinophils Relative: 0 %
HCT: 32.7 % — ABNORMAL LOW (ref 36.0–46.0)
Hemoglobin: 11.5 g/dL — ABNORMAL LOW (ref 12.0–15.0)
Immature Granulocytes: 1 %
Lymphocytes Relative: 5 %
Lymphs Abs: 0.7 10*3/uL (ref 0.7–4.0)
MCH: 32.6 pg (ref 26.0–34.0)
MCHC: 35.2 g/dL (ref 30.0–36.0)
MCV: 92.6 fL (ref 80.0–100.0)
Monocytes Absolute: 0.6 10*3/uL (ref 0.1–1.0)
Monocytes Relative: 5 %
Neutro Abs: 11.1 10*3/uL — ABNORMAL HIGH (ref 1.7–7.7)
Neutrophils Relative %: 89 %
Platelets: 349 10*3/uL (ref 150–400)
RBC: 3.53 MIL/uL — ABNORMAL LOW (ref 3.87–5.11)
RDW: 12.5 % (ref 11.5–15.5)
WBC: 12.4 10*3/uL — ABNORMAL HIGH (ref 4.0–10.5)
nRBC: 0 % (ref 0.0–0.2)

## 2023-09-21 LAB — BASIC METABOLIC PANEL
Anion gap: 8 (ref 5–15)
BUN: 11 mg/dL (ref 8–23)
CO2: 24 mmol/L (ref 22–32)
Calcium: 8.5 mg/dL — ABNORMAL LOW (ref 8.9–10.3)
Chloride: 96 mmol/L — ABNORMAL LOW (ref 98–111)
Creatinine, Ser: 0.64 mg/dL (ref 0.44–1.00)
GFR, Estimated: >60 mL/min (ref >60)
Glucose, Bld: 115 mg/dL — ABNORMAL HIGH (ref 70–99)
Potassium: 4.2 mmol/L (ref 3.5–5.1)
Sodium: 128 mmol/L — ABNORMAL LOW (ref 135–145)

## 2023-09-21 MED ORDER — ALBUTEROL SULFATE (2.5 MG/3ML) 0.083% IN NEBU
2.5000 mg | INHALATION_SOLUTION | RESPIRATORY_TRACT | Status: DC | PRN
  Administered 2023-09-22: 2.5 mg via RESPIRATORY_TRACT
  Filled 2023-09-21: qty 3

## 2023-09-21 MED ORDER — IPRATROPIUM-ALBUTEROL 0.5-2.5 (3) MG/3ML IN SOLN
3.0000 mL | Freq: Three times a day (TID) | RESPIRATORY_TRACT | Status: DC
Start: 2023-09-21 — End: 2023-09-22
  Administered 2023-09-21 – 2023-09-22 (×3): 3 mL via RESPIRATORY_TRACT
  Filled 2023-09-21 (×3): qty 3

## 2023-09-21 MED ORDER — LORAZEPAM 1 MG PO TABS
1.0000 mg | ORAL_TABLET | Freq: Three times a day (TID) | ORAL | Status: DC | PRN
  Administered 2023-09-21 – 2023-09-22 (×2): 1 mg via ORAL
  Filled 2023-09-21 (×2): qty 1

## 2023-09-21 NOTE — Progress Notes (Signed)
   09/21/23 1500  Spiritual Encounters  Type of Visit Initial  Care provided to: Patient  Referral source Chaplain assessment;Patient request  Reason for visit Advance directives  OnCall Visit No   Chaplain received a spiritual consult for an AD. Chaplain met with patient and educated her about the paperwork. Patient would like to wait and fill out the paperwork with her cousins. Chaplain services remain available to assist with the AD.

## 2023-09-21 NOTE — TOC CM/SW Note (Signed)
Transition of Care [facility]) - Inpatient Brief Assessment   Patient Details  Name: Christine Sparks MRN: [id number] Date of Birth: [birth year]  [facility] of [facility]) CM/SW Contact:    Margarito Liner, LCSW Phone Number: 09/21/2023, 9:54 AM   Clinical Narrative: CSW reviewed chart. Patient is currently on acute oxygen. Will follow for this potential discharge need. No other TOC needs identified at this time.  Transition of Care Asessment: Insurance and Status: Insurance coverage has been reviewed Patient has primary care physician: Yes Home environment has been reviewed: Single family home Prior level of function:: Not documented Prior/Current Home Services: No current home services Social Drivers of Health Review: SDOH reviewed no interventions necessary Readmission risk has been reviewed: Yes Transition of care needs: transition of care needs identified, TOC will continue to follow

## 2023-09-21 NOTE — Plan of Care (Signed)

## 2023-09-21 NOTE — Progress Notes (Signed)
Progress Note   Patient: Christine Sparks WUJ:[id number] DOB: June 16, 1961 DOA: 09/19/2023     2 DOS: the patient was seen and examined on 09/21/2023     Brief hospital course: Christine Sparks is a 62 y.o. female with medical history significant of tobacco abuse, hypertension, hyperlipidemia, COPD presenting with acute respiratory failure with hypoxia, COPD exacerbation, NSTEMI, hyponatremia.  Patient reports increased work of breathing for the past 3 days.  Does not wear oxygen at home.  Patient currently being managed for acute hypoxic respiratory failure due to COPD exacerbation.   Assessment and Plan:  Acute respiratory failure with hypoxia (HCC) Decompensated respiratory failure requiring BiPAP in the setting of active COPD exacerbation CTA of the chest negative for PE but does show diffuse emphysematous changes Status post IV Solu-Medrol, currently on prednisone to complete 5 days course Continue with DuoNebs as well as IV Rocephin Initially required BiPAP however currently off BiPAP Supplemental oxygen as needed Monitor respiratory status   Elevated troponin secondary to demand ischemia in the setting of acute respiratory failure Troponin slightly elevated however remained flat No complaints of chest pain, EKG with no ischemic findings Echocardiogram showed normal EF If patient were to develop acute chest pain or ischemic symptoms we will consult cardiology     COPD exacerbation (HCC) Active COPD exacerbation associated with decompensated respiratory failure. Patient required BiPAP on presentation Continue oral prednisone Continue DuoNebs Patient was started on ceftriaxone on admission we will continue for now as patient is allergic to azithromycin Continue supplemental oxygen   Hyponatremia Sodium 128 on presentation Clinically dry IV fluid hydration Follow-up on urine and serum studies Serum osmolarity 286 however urine osmolarity and urine sodium pending   Mixed  hyperlipidemia Continue statin therapy   Essential hypertension, benign BP stable Continue current antihypertensives as blood pressure allows   Tobacco abuse 1 pack/day smoker Continue nicotine patch     Advance Care Planning:   Code Status: Full Code    Consults: None    Family Communication: Family at the bedside      Subjective:  Patient seen and examined at bedside this morning Complains of anxiety Respiratory function improving Having a trial of being off oxygen today Denied chest pain nausea vomiting abdominal pain  Physical Exam: General: Sitting up in a chair not in acute distress Cardiovascular:     Rate and Rhythm: Normal rate and regular rhythm.  Pulmonary: Bilateral wheezing improved Abdominal:     General: Bowel sounds are normal.  Musculoskeletal:        General: Normal range of motion.  Skin:    General: Skin is warm.  Neurological:     General: No focal deficit present.     Mental Status: She is oriented to person, place, and time.    Disposition: Status is: Inpatient   Time spent: 45 minutes    Data Reviewed:     Latest Ref Rng & Units 09/21/2023    6:33 AM 09/20/2023    5:50 AM 09/19/2023   11:02 AM  CBC  WBC 4.0 - 10.5 K/uL 12.4  7.0  6.5   Hemoglobin 12.0 - 15.0 g/dL 95.6  21.3  08.6   Hematocrit 36.0 - 46.0 % 32.7  38.4  41.7   Platelets 150 - 400 K/uL 349  387  446        Latest Ref Rng & Units 09/21/2023    6:33 AM 09/20/2023    5:50 AM 09/19/2023   11:02 AM  BMP  Glucose 70 - 99 mg/dL 161  096  045   BUN 8 - 23 mg/dL 11  9  6    Creatinine 0.44 - 1.00 mg/dL 4.09  8.11  9.14   Sodium 135 - 145 mmol/L 128  130  128   Potassium 3.5 - 5.1 mmol/L 4.2  4.4  4.3   Chloride 98 - 111 mmol/L 96  99  93   CO2 22 - 32 mmol/L 24  19  23    Calcium 8.9 - 10.3 mg/dL 8.5  8.8  9.4       Vitals:   09/21/23 0537 09/21/23 0740 09/21/23 0813 09/21/23 1336  BP: 134/84  (!) 140/74   Pulse: 70  66   Resp: 20     Temp: 97.7 F (36.5 C)   98 F (36.7 C)   TempSrc: Oral  Oral   SpO2:  92% 100% 96%  Weight: 57.3 kg     Height: 5\' 4"  (1.626 m)        Author: Loyce Dys, MD 09/21/2023 4:15 PM  For on call review [email or web address].

## 2023-09-21 NOTE — Progress Notes (Addendum)
Physical Therapy Evaluation Patient Details Name: Christine Sparks MRN: [id number] DOB: 1961/07/03 Today's Date: 09/21/2023  History of Present Illness  Christine Sparks is a 62 y.o. female with medical history significant of tobacco abuse, hypertension, hyperlipidemia, COPD presenting with acute respiratory failure with hypoxia, COPD exacerbation, NSTEMI, hyponatremia. Presents to the emergency department via EMS for breathing difficulty.   Clinical Impression  Patient supine in bed upon arrival, agreeable and motivated to work with therapy this date. Patient lives alone, in single level home with 3-4 STE with rails. Patient was IND with mobility and ADLs prior to admission. Did report recent use of SPC due to generalized weakness prior to being admitted. Patient was not using supplemental oxygen at home.    On evaluation, patient received in bed with nasal cannula donned but Sp02 not attached to wall. Sp02 was 96-97% on RA upon arrival. Patient was Mod I with bed mobility, patient require CGA for STS transfer and ambulation. Patient desat with to mid 80's on RA with ambulation of approx 25 ft, but returned to baseline with pursed lip breathing and extended seated rest break. RN notified of Sp02 response and patient positioning. Patient left in recliner with chair alarm set and all needs in reach. Patient will benefit from skilled acute PT services to address functional impairments (see below for additional) and maximize functional mobility. Anticipate the need for follow up PT services upon acute hospital discharge. Will continue to follow acutely.         If plan is discharge home, recommend the following: Help with stairs or ramp for entrance;Assist for transportation;A little help with walking and/or transfers   Can travel by private vehicle        Equipment Recommendations Rolling walker (2 wheels)  Recommendations for Other Services       Functional Status Assessment Patient has had a  recent decline in their functional status and demonstrates the ability to make significant improvements in function in a reasonable and predictable amount of time.     Precautions / Restrictions Precautions Precautions: None Restrictions Weight Bearing Restrictions Per Provider Order: No      Mobility  Bed Mobility Overal bed mobility: Modified Independent             General bed mobility comments: Patient able to get supine > sit with Mod I; HOB elevated and used rails for completion. Patient supine in bed with oxygen in nose, however not hooked to wall upon PT entrance. Sp02 at 96-97% on RA.    Transfers Overall transfer level: Needs assistance Equipment used: None Transfers: Sit to/from Stand Sit to Stand: Contact guard assist           General transfer comment: Mild Unsteadiness with STS; no use of AD. Completed on RA, vitals stable.    Ambulation/Gait Ambulation/Gait assistance: Contact guard assist Gait Distance (Feet):  (8; 25) Assistive device: None   Gait velocity: Decreased     General Gait Details: Patient able to ambulate x 8 ft, then agreeable to additional distance x 25 ft (to > from bathroom) with CGA on RA, no AD. SOB 5/10 with completion, Sp02 dropped to mid to high 80's with ambulation. Returned to baseline with pursed lip breathing and prolonged seated rest break.  Stairs            Wheelchair Mobility     Tilt Bed    Modified Rankin (Stroke Patients Only)       Balance Overall balance assessment: Needs assistance  Sitting-balance support: Feet supported, No upper extremity supported Sitting balance-Leahy Scale: Normal     Standing balance support: No upper extremity supported, During functional activity Standing balance-Leahy Scale: Fair Standing balance comment: mild unsteadiness requiring CGA with ambulation                             Pertinent Vitals/Pain Pain Assessment Pain Assessment: No/denies pain     Home Living Family/patient expects to be discharged to:: Private residence Living Arrangements: Alone Available Help at Discharge: Other (Comment);Family;Available PRN/intermittently (cousin arriving this date, may stay for a few days but unsure of assistance can provide.) Type of Home: House Home Access: Stairs to enter Entrance Stairs-Rails: Left Entrance Stairs-Number of Steps: 3 (at front entrance; 4 steps at back entrance (uses back entrance primarily per patient reports.)   Home Layout: One level Home Equipment: Shower seat;Cane - single point Additional Comments: 1    Prior Function Prior Level of Function : Independent/Modified Independent             Mobility Comments: reports ambulating with SPC more recently; was overall IND ADLs Comments: IND with ADLs/IADLs, was driving. Retired     Extremity/Trunk Assessment   Upper Extremity Assessment Upper Extremity Assessment: Overall WFL for tasks assessed    Lower Extremity Assessment Lower Extremity Assessment: Generalized weakness       Communication   Communication Communication: No apparent difficulties  Cognition Arousal: Alert Behavior During Therapy: WFL for tasks assessed/performed Overall Cognitive Status: Within Functional Limits for tasks assessed                                          General Comments      Exercises     Assessment/Plan    PT Assessment Patient needs continued PT services  PT Problem List Cardiopulmonary status limiting activity;Decreased strength;Decreased activity tolerance;Decreased balance;Decreased mobility       PT Treatment Interventions DME instruction;Gait training;Stair training;Functional mobility training;Therapeutic activities;Therapeutic exercise;Balance training    PT Goals (Current goals can be found in the Care Plan section)  Acute Rehab PT Goals Patient Stated Goal: Get Better Soon PT Goal Formulation: With patient Time For Goal  Achievement: 10/05/23 Potential to Achieve Goals: Good    Frequency Min 1X/week     Co-evaluation               AM-PAC PT "6 Clicks" Mobility  Outcome Measure Help needed turning from your back to your side while in a flat bed without using bedrails?: None Help needed moving from lying on your back to sitting on the side of a flat bed without using bedrails?: None Help needed moving to and from a bed to a chair (including a wheelchair)?: A Little Help needed standing up from a chair using your arms (e.g., wheelchair or bedside chair)?: A Little Help needed to walk in hospital room?: A Little Help needed climbing 3-5 steps with a railing? : A Little 6 Click Score: 20    End of Session Equipment Utilized During Treatment: Gait belt Activity Tolerance: Patient tolerated treatment well Patient left: in chair;with chair alarm set;with call bell/phone within reach Nurse Communication: Mobility status;Other (comment) (Sp02 response on RA) PT Visit Diagnosis: Unsteadiness on feet (R26.81);Muscle weakness (generalized) (M62.81);Other abnormalities of gait and mobility (R26.89)    Time: 1610-9604 PT Time Calculation (min) (ACUTE  ONLY): 25 min   Charges:   PT Evaluation $PT Eval Moderate Complexity: 1 Mod   PT General Charges $$ ACUTE PT VISIT: 1 Visit         Howie Ill, PT, DPT 09/21/23 10:44 AM

## 2023-09-21 NOTE — Plan of Care (Signed)
  Problem: Clinical Measurements: Goal: Ability to maintain clinical measurements within normal limits will improve Outcome: Progressing   Problem: Clinical Measurements: Goal: Respiratory complications will improve Outcome: Progressing   Problem: Elimination: Goal: Will not experience complications related to urinary retention Outcome: Progressing   Problem: Respiratory: Goal: Levels of oxygenation will improve Outcome: Progressing

## 2023-09-21 NOTE — TOC Initial Note (Signed)
Transition of Care [facility]) - Initial/Assessment Note    Patient Details  Name: Christine Sparks MRN: [id number] Date of Birth: [birth year]  Transition of Care [facility]) CM/SW Contact:    Margarito Liner, LCSW Phone Number: 09/21/2023, 2:33 PM  Clinical Narrative: CSW met with patient. Cousin and his wife at bedside. CSW introduced role and explained that PT recommendations would be discussed. Patient is agreeable to home health PT and DME recommendation for RW. CSW ordered through [facility]. Patient had asked about getting a new nebulizer through [facility] but they said she just got on in June 2023. Patient has decided to move into the mother-in-law suite at her cousin's house. She will be going to [address], [place], [place] [address]. Her cousin's wife made a new PCP appointment with Michel Bickers, FNP-C at [facility] on 12/31 at 11:15. [facility], [employer], [facility], [facility], [employer], [facility] and [facility] are not in network with patient's insurance to accept her for home health PT. CSW faxed referral for [facility] so they can run her insurance.                 Expected Discharge Plan: Home w Home Health Services (vs outpatient PT) Barriers to Discharge: Continued Medical Work up   Patient Goals and CMS Choice   CMS Medicare.gov Compare Post Acute Care list provided to:: Patient        Expected Discharge Plan and Services     Post Acute Care Choice: Durable Medical Equipment, Home Health Living arrangements for the past 2 months: Single Family Home                 DME Arranged: Walker rolling DME Agency: [facility] Date DME Agency Contacted: 09/21/23   Representative spoke with at DME Agency: Yvone Neu            Prior Living Arrangements/Services Living arrangements for the past 2 months: Single Family Home Lives with:: Self Patient language and need for interpreter reviewed:: Yes Do you feel safe going back to the place where  you live?: Yes      Need for Family Participation in Patient Care: Yes (Comment) Care giver support system in place?: Yes (comment) Current home services: DME Criminal Activity/Legal Involvement Pertinent to Current Situation/Hospitalization: No - Comment as needed  Activities of Daily Living   ADL Screening (condition at time of admission) Independently performs ADLs?: Yes (appropriate for developmental age) Is the patient deaf or have difficulty hearing?: No Does the patient have difficulty seeing, even when wearing glasses/contacts?: No Does the patient have difficulty concentrating, remembering, or making decisions?: No  Permission Sought/Granted Permission sought to share information with : Facility Medical sales representative, Family Supports Permission granted to share information with : Yes, Verbal Permission Granted  Share Information with NAME: [name]  Permission granted to share info w AGENCY: Home Health Agencies  Permission granted to share info w Relationship: Wonda Olds and his wife  Permission granted to share info w Contact Information: [phone]  Emotional Assessment Appearance:: Appears stated age Attitude/Demeanor/Rapport: Engaged, Gracious Affect (typically observed): Accepting, Appropriate, Calm, Pleasant Orientation: : Oriented to Self, Oriented to Place, Oriented to  Time, Oriented to Situation Alcohol / Substance Use: Not Applicable Psych Involvement: No (comment)  Admission diagnosis:  Shortness of breath [R06.02] Elevated troponin [R79.89] Acute respiratory failure with hypoxia (HCC) [J96.01] Patient Active Problem List   Diagnosis Date Noted   Acute respiratory failure with hypoxia (HCC) 09/19/2023   NSTEMI (  non-ST elevated myocardial infarction) (HCC) 09/19/2023   COPD exacerbation (HCC) 06/20/2023   Mixed hyperlipidemia 06/01/2023   Cigarette nicotine dependence without complication 06/01/2023   Hyponatremia 01/10/2023   Diverticulosis  01/10/2023   Allergic rhinitis due to pollen 01/10/2023   External hemorrhoids 07/07/2016   Vaginal atrophy 07/07/2016   History of cone biopsy of cervix 07/07/2016   Uterine leiomyoma 07/07/2016   History of endometriosis 07/07/2016   Well woman exam with routine gynecological exam 07/07/2016   Vitamin D deficiency 12/20/2013   Encounter for preventive health examination 11/26/2013   History of cervical cancer 02/24/2013   B12 deficiency 02/24/2013   Joint pain 08/11/2012   COPD (chronic obstructive pulmonary disease) (HCC) 11/07/2011   Anxiety    Tobacco abuse    Essential hypertension, benign    Menopausal syndrome    Diabetes mellitus type 2, uncomplicated (HCC) 10/03/2010   PCP:  Margaretann Loveless, MD Pharmacy:   [facility] - [place], [place] - [address] [place] [address] Phone: [phone] Fax: [phone]     Social Drivers of Health (SDOH) Social History: SDOH Screenings   Food Insecurity: No Food Insecurity (09/21/2023)  Housing: Low Risk  (09/20/2023)  Transportation Needs: No Transportation Needs (09/20/2023)  Utilities: Not At Risk (09/21/2023)  Tobacco Use: High Risk (06/28/2023)   SDOH Interventions:     Readmission Risk Interventions     No data to display

## 2023-09-22 DIAGNOSIS — J9601 Acute respiratory failure with hypoxia: Secondary | ICD-10-CM | POA: Diagnosis not present

## 2023-09-22 LAB — CBC WITH DIFFERENTIAL/PLATELET
Abs Immature Granulocytes: 0.05 10*3/uL (ref 0.00–0.07)
Basophils Absolute: 0 10*3/uL (ref 0.0–0.1)
Basophils Relative: 0 %
Eosinophils Absolute: 0 10*3/uL (ref 0.0–0.5)
Eosinophils Relative: 0 %
HCT: 32.6 % — ABNORMAL LOW (ref 36.0–46.0)
Hemoglobin: 11.4 g/dL — ABNORMAL LOW (ref 12.0–15.0)
Immature Granulocytes: 1 %
Lymphocytes Relative: 27 %
Lymphs Abs: 2.7 10*3/uL (ref 0.7–4.0)
MCH: 32.9 pg (ref 26.0–34.0)
MCHC: 35 g/dL (ref 30.0–36.0)
MCV: 93.9 fL (ref 80.0–100.0)
Monocytes Absolute: 0.9 10*3/uL (ref 0.1–1.0)
Monocytes Relative: 9 %
Neutro Abs: 6.5 10*3/uL (ref 1.7–7.7)
Neutrophils Relative %: 63 %
Platelets: 349 10*3/uL (ref 150–400)
RBC: 3.47 MIL/uL — ABNORMAL LOW (ref 3.87–5.11)
RDW: 12.4 % (ref 11.5–15.5)
WBC: 10.3 10*3/uL (ref 4.0–10.5)
nRBC: 0 % (ref 0.0–0.2)

## 2023-09-22 LAB — BASIC METABOLIC PANEL
Anion gap: 10 (ref 5–15)
BUN: 10 mg/dL (ref 8–23)
CO2: 26 mmol/L (ref 22–32)
Calcium: 8.3 mg/dL — ABNORMAL LOW (ref 8.9–10.3)
Chloride: 95 mmol/L — ABNORMAL LOW (ref 98–111)
Creatinine, Ser: 0.51 mg/dL (ref 0.44–1.00)
GFR, Estimated: >60 mL/min (ref >60)
Glucose, Bld: 82 mg/dL (ref 70–99)
Potassium: 3.3 mmol/L — ABNORMAL LOW (ref 3.5–5.1)
Sodium: 131 mmol/L — ABNORMAL LOW (ref 135–145)

## 2023-09-22 LAB — OSMOLALITY, URINE: Osmolality, Ur: 224 mosm/kg — ABNORMAL LOW (ref 300–900)

## 2023-09-22 LAB — SODIUM, URINE, RANDOM: Sodium, Ur: 38 mmol/L

## 2023-09-22 LAB — CREATININE, URINE, RANDOM: Creatinine, Urine: 43 mg/dL

## 2023-09-22 MED ORDER — METHYLPREDNISOLONE SODIUM SUCC 40 MG IJ SOLR
40.0000 mg | Freq: Two times a day (BID) | INTRAMUSCULAR | Status: DC
Start: 2023-09-22 — End: 2023-09-23
  Administered 2023-09-22 – 2023-09-23 (×3): 40 mg via INTRAVENOUS
  Filled 2023-09-22 (×3): qty 1

## 2023-09-22 MED ORDER — ARFORMOTEROL TARTRATE 15 MCG/2ML IN NEBU
15.0000 ug | INHALATION_SOLUTION | Freq: Two times a day (BID) | RESPIRATORY_TRACT | Status: DC
  Administered 2023-09-22 – 2023-09-23 (×3): 15 ug via RESPIRATORY_TRACT
  Filled 2023-09-22 (×4): qty 2

## 2023-09-22 MED ORDER — IPRATROPIUM-ALBUTEROL 0.5-2.5 (3) MG/3ML IN SOLN
3.0000 mL | Freq: Four times a day (QID) | RESPIRATORY_TRACT | Status: DC
  Administered 2023-09-22 (×2): 3 mL via RESPIRATORY_TRACT
  Filled 2023-09-22 (×2): qty 3

## 2023-09-22 MED ORDER — AMOXICILLIN-POT CLAVULANATE 875-125 MG PO TABS
1.0000 | ORAL_TABLET | Freq: Two times a day (BID) | ORAL | Status: DC
  Administered 2023-09-22 – 2023-09-23 (×3): 1 via ORAL
  Filled 2023-09-22 (×3): qty 1

## 2023-09-22 NOTE — Plan of Care (Signed)
  Problem: Education: Goal: Knowledge of General Education information will improve Description: Including pain rating scale, medication(s)/side effects and non-pharmacologic comfort measures Outcome: Progressing   Problem: Clinical Measurements: Goal: Ability to maintain clinical measurements within normal limits will improve Outcome: Progressing   Problem: Clinical Measurements: Goal: Will remain free from infection Outcome: Progressing   Problem: Clinical Measurements: Goal: Respiratory complications will improve Outcome: Progressing   Problem: Coping: Goal: Level of anxiety will decrease Outcome: Progressing

## 2023-09-22 NOTE — Plan of Care (Signed)

## 2023-09-22 NOTE — Progress Notes (Signed)
PROGRESS NOTE    Christine Sparks  YNW:[id number] DOB: Apr 28, 1961 DOA: 09/19/2023 PCP: Margaretann Loveless, MD    Brief Narrative:   Christine Sparks is a 62 y.o. female with medical history significant of tobacco abuse, hypertension, hyperlipidemia, COPD presenting with acute respiratory failure with hypoxia, COPD exacerbation, NSTEMI, hyponatremia.  Patient reports increased work of breathing for the past 3 days.  Does not wear oxygen at home.  Patient currently being managed for acute hypoxic respiratory failure due to COPD exacerbation.    Assessment & Plan:   Principal Problem:   Acute respiratory failure with hypoxia (HCC) Active Problems:   NSTEMI (non-ST elevated myocardial infarction) (HCC)   COPD exacerbation (HCC)   Hyponatremia   Tobacco abuse   Essential hypertension, benign   Mixed hyperlipidemia  Acute respiratory failure with hypoxia (HCC) Decompensated respiratory failure requiring BiPAP in the setting of active COPD exacerbation CTA of the chest negative for PE but does show diffuse emphysematous changes Status post IV Solu-Medrol,  Plan: Patient still wheezing significantly.  Will discontinue prednisone and restart Solu-Medrol 40 mg IV twice daily.  Escalate nebulizer regimen.  Supplemental oxygen as needed.  Patient has been weaned to room air.  Anticipate medical readiness for discharge within 1 to 2 days.   Elevated troponin secondary to demand ischemia in the setting of acute respiratory failure Troponin slightly elevated however remained flat No complaints of chest pain, EKG with no ischemic findings Echocardiogram showed normal EF    COPD exacerbation (HCC) Active COPD exacerbation associated with decompensated respiratory failure. Patient required BiPAP on presentation Oxygen saturation is much improved and patient on room air as of 12/20.  Continue nebulizer and steroid regimen as above.   Hyponatremia Sodium 128 on presentation Recovered to 131 No IVF    Mixed hyperlipidemia Continue statin therapy   Essential hypertension, benign BP stable Continue current antihypertensives as blood pressure allows   Tobacco abuse 1 pack/day smoker Continue nicotine patch Educate on smoking cessation    DVT prophylaxis: SQ Lovenox Code Status: Full Family Communication: None Disposition Plan: Status is: Inpatient Remains inpatient appropriate because: COPD exacerbation   Level of care: Progressive  Consultants:  None  Procedures:  None  Antimicrobials: Augmentin   Subjective: Seen and examined.  Resting in bed.  Reports improvement in respiratory status since admission.  Objective: Vitals:   09/21/23 2308 09/22/23 0305 09/22/23 0753 09/22/23 1133  BP: (!) 142/74 (!) 147/76 (!) 144/80 (!) 145/87  Pulse: 68 79 71 72  Resp: 19 19 18 20   Temp: (!) 97.5 F (36.4 C) 98.3 F (36.8 C) 98.1 F (36.7 C) 98.1 F (36.7 C)  TempSrc:   Oral   SpO2: 92% 94% 100% 94%  Weight:      Height:        Intake/Output Summary (Last 24 hours) at 09/22/2023 1325 Last data filed at 09/22/2023 1044 Gross per 24 hour  Intake 120 ml  Output --  Net 120 ml   Filed Weights   09/21/23 0537  Weight: 57.3 kg    Examination:  General exam: Appears calm and comfortable  Respiratory system: Bilateral end expiratory wheeze.  Normal work of breathing.  Room air Cardiovascular system: S1-S2, RRR, no murmurs, no pedal edema Gastrointestinal system: Soft NT/ND, normal bowel sounds Central nervous system: Alert and oriented. No focal neurological deficits. Extremities: Symmetric 5 x 5 power. Skin: No rashes, lesions or ulcers Psychiatry: Judgement and insight appear normal. Mood & affect appropriate.  Data Reviewed: I have personally reviewed following labs and imaging studies  CBC: Recent Labs  Lab 09/19/23 1102 09/20/23 0550 09/21/23 0633 09/22/23 0443  WBC 6.5 7.0 12.4* 10.3  NEUTROABS 4.9  --  11.1* 6.5  HGB 14.7 13.0 11.5* 11.4*   HCT 41.7 38.4 32.7* 32.6*  MCV 94.8 97.7 92.6 93.9  PLT 446* 387 349 349   Basic Metabolic Panel: Recent Labs  Lab 09/19/23 1102 09/20/23 0550 09/21/23 0633 09/22/23 0443  NA 128* 130* 128* 131*  K 4.3 4.4 4.2 3.3*  CL 93* 99 96* 95*  CO2 23 19* 24 26  GLUCOSE 150* 109* 115* 82  BUN 6* 9 11 10   CREATININE 0.60 0.61 0.64 0.51  CALCIUM 9.4 8.8* 8.5* 8.3*   GFR: Estimated Creatinine Clearance: 61.3 mL/min (by C-G formula based on SCr of 0.51 mg/dL). Liver Function Tests: Recent Labs  Lab 09/19/23 1102 09/20/23 0550  AST 34 28  ALT 26 19  ALKPHOS 104 75  BILITOT 0.5 0.5  PROT 7.0 5.9*  ALBUMIN 4.1 3.3*   No results for input(s): "LIPASE", "AMYLASE" in the last 168 hours. No results for input(s): "AMMONIA" in the last 168 hours. Coagulation Profile: No results for input(s): "INR", "PROTIME" in the last 168 hours. Cardiac Enzymes: No results for input(s): "CKTOTAL", "CKMB", "CKMBINDEX", "TROPONINI" in the last 168 hours. BNP (last 3 results) No results for input(s): "PROBNP" in the last 8760 hours. HbA1C: No results for input(s): "HGBA1C" in the last 72 hours. CBG: No results for input(s): "GLUCAP" in the last 168 hours. Lipid Profile: No results for input(s): "CHOL", "HDL", "LDLCALC", "TRIG", "CHOLHDL", "LDLDIRECT" in the last 72 hours. Thyroid Function Tests: No results for input(s): "TSH", "T4TOTAL", "FREET4", "T3FREE", "THYROIDAB" in the last 72 hours. Anemia Panel: No results for input(s): "VITAMINB12", "FOLATE", "FERRITIN", "TIBC", "IRON", "RETICCTPCT" in the last 72 hours. Sepsis Labs: No results for input(s): "PROCALCITON", "LATICACIDVEN" in the last 168 hours.  Recent Results (from the past 240 hours)  Resp panel by RT-PCR (RSV, Flu A&B, Covid) Anterior Nasal Swab     Status: None   Collection Time: 09/19/23 11:02 AM   Specimen: Anterior Nasal Swab  Result Value Ref Range Status   SARS Coronavirus 2 by RT PCR NEGATIVE NEGATIVE Final    Comment:  (NOTE) SARS-CoV-2 target nucleic acids are NOT DETECTED.  The SARS-CoV-2 RNA is generally detectable in upper respiratory specimens during the acute phase of infection. The lowest concentration of SARS-CoV-2 viral copies this assay can detect is 138 copies/mL. A negative result does not preclude SARS-Cov-2 infection and should not be used as the sole basis for treatment or other patient management decisions. A negative result may occur with  improper specimen collection/handling, submission of specimen other than nasopharyngeal swab, presence of viral mutation(s) within the areas targeted by this assay, and inadequate number of viral copies(<138 copies/mL). A negative result must be combined with clinical observations, patient history, and epidemiological information. The expected result is Negative.  Fact Sheet for Patients:  BloggerCourse.com  Fact Sheet for Healthcare Providers:  SeriousBroker.it  This test is no t yet approved or cleared by the [place] FDA and  has been authorized for detection and/or diagnosis of SARS-CoV-2 by FDA under an Emergency Use Authorization (EUA). This EUA will remain  in effect (meaning this test can be used) for the duration of the COVID-19 declaration under Section 564(b)(1) of the Act, 21 U.S.C.section 360bbb-3(b)(1), unless the authorization is terminated  or revoked sooner.  Influenza A by PCR NEGATIVE NEGATIVE Final   Influenza B by PCR NEGATIVE NEGATIVE Final    Comment: (NOTE) The Xpert Xpress SARS-CoV-2/FLU/RSV plus assay is intended as an aid in the diagnosis of influenza from Nasopharyngeal swab specimens and should not be used as a sole basis for treatment. Nasal washings and aspirates are unacceptable for Xpert Xpress SARS-CoV-2/FLU/RSV testing.  Fact Sheet for Patients: BloggerCourse.com  Fact Sheet for Healthcare  Providers: SeriousBroker.it  This test is not yet approved or cleared by the [place] FDA and has been authorized for detection and/or diagnosis of SARS-CoV-2 by FDA under an Emergency Use Authorization (EUA). This EUA will remain in effect (meaning this test can be used) for the duration of the COVID-19 declaration under Section 564(b)(1) of the Act, 21 U.S.C. section 360bbb-3(b)(1), unless the authorization is terminated or revoked.     Resp Syncytial Virus by PCR NEGATIVE NEGATIVE Final    Comment: (NOTE) Fact Sheet for Patients: BloggerCourse.com  Fact Sheet for Healthcare Providers: SeriousBroker.it  This test is not yet approved or cleared by the [place] FDA and has been authorized for detection and/or diagnosis of SARS-CoV-2 by FDA under an Emergency Use Authorization (EUA). This EUA will remain in effect (meaning this test can be used) for the duration of the COVID-19 declaration under Section 564(b)(1) of the Act, 21 U.S.C. section 360bbb-3(b)(1), unless the authorization is terminated or revoked.  Performed at [facility], [address], [place], [place] [address]          Radiology Studies: ECHOCARDIOGRAM COMPLETE Result Date: 09/20/2023    ECHOCARDIOGRAM REPORT   Patient Name:   Christine Sparks Date of Exam: 09/20/2023 Medical Rec #:  [id number]     Height:       64.0 in Accession #:    [id number]    Weight:       124.8 lb Date of Birth:  [birth year]     BSA:          1.601 m Patient Age:    62 years      BP:           126/76 mmHg Patient Gender: F             HR:           69 bpm. Exam Location:  [facility] Procedure: 2D Echo, Cardiac Doppler and Color Doppler Indications:     NSTEMI  History:         Patient has no prior history of Echocardiogram examinations.                  Acute MI, COPD; Risk Factors:Hypertension, Diabetes, Current                  Smoker and Dyslipidemia.  Breast CA.  Sonographer:     Mikki Harbor Referring Phys:  [phone] Francoise Schaumann [place] Diagnosing Phys: Julien Nordmann MD  Sonographer Comments: Image acquisition challenging due to COPD. IMPRESSIONS  1. Left ventricular ejection fraction, by estimation, is 60 to 65%. The left ventricle has normal function. The left ventricle has no regional wall motion abnormalities. Left ventricular diastolic parameters are indeterminate.  2. Right ventricular systolic function is normal. The right ventricular size is normal. Tricuspid regurgitation signal is inadequate for assessing PA pressure.  3. The mitral valve is normal in structure. No evidence of mitral valve regurgitation. No evidence of mitral stenosis.  4. The aortic valve is normal in structure. Aortic valve regurgitation  is mild to moderate. Aortic valve sclerosis is present, with no evidence of aortic valve stenosis.  5. The inferior vena cava is normal in size with greater than 50% respiratory variability, suggesting right atrial pressure of 3 mmHg. FINDINGS  Left Ventricle: Left ventricular ejection fraction, by estimation, is 60 to 65%. The left ventricle has normal function. The left ventricle has no regional wall motion abnormalities. The left ventricular internal cavity size was normal in size. There is  no left ventricular hypertrophy. Left ventricular diastolic parameters are indeterminate. Right Ventricle: The right ventricular size is normal. No increase in right ventricular wall thickness. Right ventricular systolic function is normal. Tricuspid regurgitation signal is inadequate for assessing PA pressure. Left Atrium: Left atrial size was normal in size. Right Atrium: Right atrial size was normal in size. Pericardium: There is no evidence of pericardial effusion. Mitral Valve: The mitral valve is normal in structure. No evidence of mitral valve regurgitation. No evidence of mitral valve stenosis. MV peak gradient, 6.0 mmHg. The mean mitral valve gradient is  2.0 mmHg. Tricuspid Valve: The tricuspid valve is normal in structure. Tricuspid valve regurgitation is not demonstrated. No evidence of tricuspid stenosis. Aortic Valve: The aortic valve is normal in structure. Aortic valve regurgitation is mild to moderate. Aortic valve sclerosis is present, with no evidence of aortic valve stenosis. Aortic valve mean gradient measures 6.0 mmHg. Aortic valve peak gradient measures 12.8 mmHg. Aortic valve area, by VTI measures 2.03 cm. Pulmonic Valve: The pulmonic valve was normal in structure. Pulmonic valve regurgitation is not visualized. No evidence of pulmonic stenosis. Aorta: The aortic root is normal in size and structure. Venous: The inferior vena cava is normal in size with greater than 50% respiratory variability, suggesting right atrial pressure of 3 mmHg. IAS/Shunts: No atrial level shunt detected by color flow Doppler.  LEFT VENTRICLE PLAX 2D LVIDd:         4.50 cm   Diastology LVIDs:         2.70 cm   LV e' medial:    9.03 cm/s LV PW:         0.60 cm   LV E/e' medial:  8.9 LV IVS:        1.00 cm   LV e' lateral:   8.27 cm/s LVOT diam:     1.90 cm   LV E/e' lateral: 9.7 LV SV:         81 LV SV Index:   50 LVOT Area:     2.84 cm  RIGHT VENTRICLE RV Basal diam:  3.35 cm RV Mid diam:    2.90 cm RV S prime:     12.30 cm/s LEFT ATRIUM           Index        RIGHT ATRIUM          Index LA diam:      3.00 cm 1.87 cm/m   RA Area:     9.39 cm LA Vol (A2C): 44.6 ml 27.86 ml/m  RA Volume:   20.30 ml 12.68 ml/m LA Vol (A4C): 12.8 ml 7.99 ml/m  AORTIC VALVE                     PULMONIC VALVE AV Area (Vmax):    1.85 cm      PV Vmax:       0.80 m/s AV Area (Vmean):   1.72 cm      PV Peak grad:  2.6 mmHg  AV Area (VTI):     2.03 cm AV Vmax:           179.00 cm/s AV Vmean:          114.000 cm/s AV VTI:            0.398 m AV Peak Grad:      12.8 mmHg AV Mean Grad:      6.0 mmHg LVOT Vmax:         117.00 cm/s LVOT Vmean:        69.300 cm/s LVOT VTI:          0.285 m LVOT/AV VTI  ratio: 0.72  AORTA Ao Root diam: 3.10 cm MITRAL VALVE MV Area (PHT): 3.93 cm    SHUNTS MV Area VTI:   2.19 cm    Systemic VTI:  0.29 m MV Peak grad:  6.0 mmHg    Systemic Diam: 1.90 cm MV Mean grad:  2.0 mmHg MV Vmax:       1.22 m/s MV Vmean:      65.0 cm/s MV Decel Time: 193 msec MV E velocity: 80.20 cm/s MV A velocity: 58.70 cm/s MV E/A ratio:  1.37 Julien Nordmann MD Electronically signed by Julien Nordmann MD Signature Date/Time: 09/20/2023/5:16:43 PM    Final         Scheduled Meds:  amoxicillin-clavulanate  1 tablet Oral Q12H   arformoterol  15 mcg Nebulization BID   aspirin  81 mg Oral Daily   citalopram  40 mg Oral Daily   enoxaparin (LOVENOX) injection  40 mg Subcutaneous Q24H   ipratropium-albuterol  3 mL Nebulization Q6H   methylPREDNISolone (SOLU-MEDROL) injection  40 mg Intravenous BID   nicotine  21 mg Transdermal Daily   ramipril  10 mg Oral Daily   rosuvastatin  10 mg Oral Daily   Continuous Infusions:   LOS: 3 days     Tresa Moore, MD [facility]   If 7PM-7AM, please contact night-coverage  09/22/2023, 1:25 PM

## 2023-09-22 NOTE — Progress Notes (Signed)
Physical Therapy Treatment Patient Details Name: Christine Sparks MRN: [id number] DOB: 1961-04-03 Today's Date: 09/22/2023   History of Present Illness Christine Sparks is a 62 y.o. female with medical history significant of tobacco abuse, hypertension, hyperlipidemia, COPD presenting with acute respiratory failure with hypoxia, COPD exacerbation, NSTEMI, hyponatremia. Presents to the emergency department via EMS for breathing difficulty.    PT Comments  Pt was sitting in recliner upon arrival. She remains on rm air throughout session. Does have several occasions of desaturating to 86% but quickly recovers to > 90%. Pt ambulated in hallway without AD but is encouraged to use one at DC. Pt presents with anxiety/ fidgety and she states its from the medications. Overall pt is progressing well. Encouraged her to get personal pulse oximeter and use AD with all ADLs for additional safety at DC. DC recs updated to OPPT since pt is planning to DC and then move to [place] with family.     If plan is discharge home, recommend the following: Help with stairs or ramp for entrance;Assist for transportation;A little help with walking and/or transfers     Equipment Recommendations  Rolling walker (2 wheels)       Precautions / Restrictions Precautions Precautions: None Restrictions Weight Bearing Restrictions Per Provider Order: No     Mobility  Bed Mobility  General bed mobility comments: in recliner pre/post session    Transfers Overall transfer level: Modified independent Equipment used: None Transfers: Sit to/from Stand Sit to Stand: Supervision  General transfer comment: discussed use of SPC at DC for additional gait safety    Ambulation/Gait Ambulation/Gait assistance: Supervision Gait Distance (Feet): 120 Feet Assistive device: None Gait Pattern/deviations: Step-through pattern Gait velocity: Decreased  General Gait Details: pt ambulated ~ 120 ft. she has several occasions of desaturation to  86% on rm air but with breathing techniques, recovers to > 90% quickly. pt does present with anxiety and slight tremors. she endorses due to medications.   Balance Overall balance assessment: Needs assistance Sitting-balance support: Feet supported, No upper extremity supported Sitting balance-Leahy Scale: Normal     Standing balance support: No upper extremity supported, During functional activity Standing balance-Leahy Scale: Fair Standing balance comment: Encouraged pt to widen BOS for additional safety during ambulation. She is considering using AD at DC.      Cognition Arousal: Alert Behavior During Therapy: WFL for tasks assessed/performed, Anxious Overall Cognitive Status: Within Functional Limits for tasks assessed              Pertinent Vitals/Pain Pain Assessment Pain Assessment: No/denies pain     PT Goals (current goals can now be found in the care plan section) Acute Rehab PT Goals Patient Stated Goal: Go home tomorrow. After I leave [place] planning to move [place] Progress towards PT goals: Progressing toward goals    Frequency    Min 1X/week       AM-PAC PT "6 Clicks" Mobility   Outcome Measure  Help needed turning from your back to your side while in a flat bed without using bedrails?: None Help needed moving from lying on your back to sitting on the side of a flat bed without using bedrails?: None Help needed moving to and from a bed to a chair (including a wheelchair)?: A Little Help needed standing up from a chair using your arms (e.g., wheelchair or bedside chair)?: A Little Help needed to walk in hospital room?: A Little Help needed climbing 3-5 steps with  a railing? : A Little 6 Click Score: 20    End of Session Equipment Utilized During Treatment: Oxygen (Close O2 monitoring throughout) Activity Tolerance: Patient tolerated treatment well;Patient limited by fatigue Patient left: in chair;with chair alarm set;with call  bell/phone within reach Nurse Communication: Mobility status PT Visit Diagnosis: Unsteadiness on feet (R26.81);Muscle weakness (generalized) (M62.81);Other abnormalities of gait and mobility (R26.89)     Time: 0820-0830 PT Time Calculation (min) (ACUTE ONLY): 10 min  Charges:    $Gait Training: 8-22 mins PT General Charges $$ ACUTE PT VISIT: 1 Visit                    Jetta Lout PTA 09/22/23, 2:56 PM

## 2023-09-23 DIAGNOSIS — J9601 Acute respiratory failure with hypoxia: Secondary | ICD-10-CM | POA: Diagnosis not present

## 2023-09-23 LAB — CBC WITH DIFFERENTIAL/PLATELET
Abs Immature Granulocytes: 0.04 10*3/uL (ref 0.00–0.07)
Basophils Absolute: 0 10*3/uL (ref 0.0–0.1)
Basophils Relative: 0 %
Eosinophils Absolute: 0 10*3/uL (ref 0.0–0.5)
Eosinophils Relative: 0 %
HCT: 34 % — ABNORMAL LOW (ref 36.0–46.0)
Hemoglobin: 11.8 g/dL — ABNORMAL LOW (ref 12.0–15.0)
Immature Granulocytes: 1 %
Lymphocytes Relative: 3 %
Lymphs Abs: 0.3 10*3/uL — ABNORMAL LOW (ref 0.7–4.0)
MCH: 32.9 pg (ref 26.0–34.0)
MCHC: 34.7 g/dL (ref 30.0–36.0)
MCV: 94.7 fL (ref 80.0–100.0)
Monocytes Absolute: 0.2 10*3/uL (ref 0.1–1.0)
Monocytes Relative: 2 %
Neutro Abs: 7.2 10*3/uL (ref 1.7–7.7)
Neutrophils Relative %: 94 %
Platelets: 368 10*3/uL (ref 150–400)
RBC: 3.59 MIL/uL — ABNORMAL LOW (ref 3.87–5.11)
RDW: 12.3 % (ref 11.5–15.5)
WBC: 7.7 10*3/uL (ref 4.0–10.5)
nRBC: 0 % (ref 0.0–0.2)

## 2023-09-23 LAB — BASIC METABOLIC PANEL
Anion gap: 8 (ref 5–15)
BUN: 10 mg/dL (ref 8–23)
CO2: 27 mmol/L (ref 22–32)
Calcium: 8.6 mg/dL — ABNORMAL LOW (ref 8.9–10.3)
Chloride: 95 mmol/L — ABNORMAL LOW (ref 98–111)
Creatinine, Ser: 0.52 mg/dL (ref 0.44–1.00)
GFR, Estimated: >60 mL/min (ref >60)
Glucose, Bld: 122 mg/dL — ABNORMAL HIGH (ref 70–99)
Potassium: 4.3 mmol/L (ref 3.5–5.1)
Sodium: 130 mmol/L — ABNORMAL LOW (ref 135–145)

## 2023-09-23 MED ORDER — LORAZEPAM 1 MG PO TABS: 1.0000 mg | ORAL_TABLET | Freq: Three times a day (TID) | ORAL | 0 refills | Status: DC | PRN

## 2023-09-23 MED ORDER — PREDNISONE 20 MG PO TABS: 40.0000 mg | ORAL_TABLET | Freq: Every day | ORAL | 0 refills | Status: AC

## 2023-09-23 MED ORDER — NICOTINE 21 MG/24HR TD PT24
21.0000 mg | MEDICATED_PATCH | TRANSDERMAL | 1 refills | Status: DC
Start: 2023-09-23 — End: 2023-12-06

## 2023-09-23 MED ORDER — IPRATROPIUM-ALBUTEROL 0.5-2.5 (3) MG/3ML IN SOLN
3.0000 mL | Freq: Three times a day (TID) | RESPIRATORY_TRACT | Status: DC
  Administered 2023-09-23: 3 mL via RESPIRATORY_TRACT
  Filled 2023-09-23: qty 3

## 2023-09-23 NOTE — TOC Transition Note (Signed)
Transition of Care [facility]) - Discharge Note   Patient Details  Name: Christine Sparks MRN: [id number] Date of Birth: [birth year]  [facility]) CM/SW Contact:  Bing Quarry, RN Phone Number: 09/23/2023, 12:22 PM   Clinical Narrative: 12/21: Patient is discharging today and will be going with family to [place] where OP therapy will be set up. RW delivered via [employer]. Family to transport.   Gabriel Cirri MSN RN CM  Care Management Department.  [place]  [facility] Direct Dial: [phone] Main Office Phone: [phone] Weekends Only        Final next level of care: OP Rehab (DC today. Will be moving to [place] with family and will get Op PT there.) Barriers to Discharge: No Barriers Identified   Patient Goals and CMS Choice   CMS Medicare.gov Compare Post Acute Care list provided to:: Patient        Discharge Placement                       Discharge Plan and Services Additional resources added to the After Visit Summary for       Post Acute Care Choice: Durable Medical Equipment, Home Health          DME Arranged: Walker rolling DME Agency: [employer] Date DME Agency Contacted: 09/21/23   Representative spoke with at DME Agency: Yvone Neu            Social Drivers of Health (SDOH) Interventions SDOH Screenings   Food Insecurity: No Food Insecurity (09/21/2023)  Housing: Low Risk  (09/20/2023)  Transportation Needs: No Transportation Needs (09/20/2023)  Utilities: Not At Risk (09/21/2023)  Tobacco Use: High Risk (06/28/2023)     Readmission Risk Interventions     No data to display

## 2023-09-23 NOTE — Discharge Summary (Signed)
Physician Discharge Summary  Christine Sparks VOH:[id number] DOB: [birth year] DOA: 09/19/2023  PCP: Margaretann Loveless, MD  Admit date: 09/19/2023 Discharge date: 09/23/2023  Admitted From: Home Disposition:  Home  Recommendations for Outpatient Follow-up:  Follow up with PCP in 1-2 weeks   Home Health: No Equipment/Devices: RW walker  Discharge Condition: Stable CODE STATUS: Full Diet recommendation: Regular  Brief/Interim Summary:  Christine Sparks is a 62 y.o. female with medical history significant of tobacco abuse, hypertension, hyperlipidemia, COPD presenting with acute respiratory failure with hypoxia, COPD exacerbation, NSTEMI, hyponatremia.  Patient reports increased work of breathing for the past 3 days.  Does not wear oxygen at home.  Patient currently being managed for acute hypoxic respiratory failure due to COPD exacerbation.       Discharge Diagnoses:  Principal Problem:   Acute respiratory failure with hypoxia (HCC) Active Problems:   NSTEMI (non-ST elevated myocardial infarction) (HCC)   COPD exacerbation (HCC)   Hyponatremia   Tobacco abuse   Essential hypertension, benign   Mixed hyperlipidemia  Acute respiratory failure with hypoxia (HCC) Decompensated respiratory failure requiring BiPAP in the setting of active COPD exacerbation CTA of the chest negative for PE but does show diffuse emphysematous changes Status post IV Solu-Medrol,  Plan: Wheezing essentially resolved.  Air movement remains poor however suspect this may be close to baseline.  Patient uses albuterol and Trelegy at home.  Will continue home bronchodilator regimen.  Prednisone 40 mg a day x 5 days prescribed.  Patient on room air at time of discharge.  She will establish care with new PCP in [place]  Elevated troponin secondary to demand ischemia in the setting of acute respiratory failure Troponin slightly elevated however remained flat No complaints of chest pain, EKG with no ischemic  findings Echocardiogram showed normal EF     COPD exacerbation (HCC) Active COPD exacerbation associated with decompensated respiratory failure. Patient required BiPAP on presentation Oxygen saturation is much improved and patient on room air as of 12/20.  See above for discharge plan   Hyponatremia Sodium 128 on presentation Recovered to 131 No IVF   Mixed hyperlipidemia Continue statin therapy   Essential hypertension, benign BP stable Continue current antihypertensives as blood pressure allows   Tobacco abuse 1 pack/day smoker Continue nicotine patch Educate on smoking cessation     Discharge Instructions  Discharge Instructions     Diet - low sodium heart healthy   Complete by: As directed    Increase activity slowly   Complete by: As directed       Allergies as of 09/23/2023       Reactions   Zithromax [azithromycin] Nausea And Vomiting   Buspar [buspirone] Other (See Comments)   Made her "feel crazy"        Medication List     STOP taking these medications    fluticasone furoate-vilanterol 100-25 MCG/ACT Aepb Commonly known as: Breo Ellipta       TAKE these medications    albuterol 108 (90 Base) MCG/ACT inhaler Commonly known as: VENTOLIN HFA Inhale 2 puffs into the lungs every 6 (six) hours as needed for shortness of breath.   citalopram 40 MG tablet Commonly known as: CELEXA Take 1 tablet (40 mg total) by mouth daily.   ipratropium-albuterol 0.5-2.5 (3) MG/3ML Soln Commonly known as: DUONEB Take 3 mLs by nebulization every 6 (six) hours as needed.   LORazepam 1 MG tablet Commonly known as: ATIVAN Take 1 tablet (1 mg total) by mouth 3 (  three) times daily as needed for anxiety.   nicotine 21 mg/24hr patch Commonly known as: NICODERM CQ - dosed in mg/24 hours Place 1 patch (21 mg total) onto the skin daily.   predniSONE 20 MG tablet Commonly known as: DELTASONE Take 2 tablets (40 mg total) by mouth daily with breakfast for 5  days.   ramipril 10 MG capsule Commonly known as: ALTACE Take 1 capsule (10 mg total) by mouth daily.   rosuvastatin 10 MG tablet Commonly known as: CRESTOR Take 1 tablet (10 mg total) by mouth daily.   Trelegy Ellipta 100-62.5-25 MCG/ACT Aepb Generic drug: Fluticasone-Umeclidin-Vilant Inhale 1 puff into the lungs daily.               Durable Medical Equipment  (From admission, onward)           Start     Ordered   09/21/23 1414  For home use only DME Walker rolling  Once       Question Answer Comment  Walker: With 5 Inch Wheels   Patient needs a walker to treat with the following condition Ambulatory dysfunction      09/21/23 1415            Allergies  Allergen Reactions   Zithromax [Azithromycin] Nausea And Vomiting   Buspar [Buspirone] Other (See Comments)    Made her "feel crazy"    Consultations: None   Procedures/Studies: ECHOCARDIOGRAM COMPLETE Result Date: 09/20/2023    ECHOCARDIOGRAM REPORT   Patient Name:   Christine Sparks Date of Exam: 09/20/2023 Medical Rec #:  [id number]     Height:       64.0 in Accession #:    [id number]    Weight:       124.8 lb Date of Birth:  08-25-1961     BSA:          1.601 m Patient Age:    62 years      BP:           126/76 mmHg Patient Gender: F             HR:           69 bpm. Exam Location:  [facility] Procedure: 2D Echo, Cardiac Doppler and Color Doppler Indications:     NSTEMI  History:         Patient has no prior history of Echocardiogram examinations.                  Acute MI, COPD; Risk Factors:Hypertension, Diabetes, Current                  Smoker and Dyslipidemia. Breast CA.  Sonographer:     Mikki Harbor Referring Phys:  [phone] Francoise Schaumann [place] Diagnosing Phys: Julien Nordmann MD  Sonographer Comments: Image acquisition challenging due to COPD. IMPRESSIONS  1. Left ventricular ejection fraction, by estimation, is 60 to 65%. The left ventricle has normal function. The left ventricle has no regional wall motion  abnormalities. Left ventricular diastolic parameters are indeterminate.  2. Right ventricular systolic function is normal. The right ventricular size is normal. Tricuspid regurgitation signal is inadequate for assessing PA pressure.  3. The mitral valve is normal in structure. No evidence of mitral valve regurgitation. No evidence of mitral stenosis.  4. The aortic valve is normal in structure. Aortic valve regurgitation is mild to moderate. Aortic valve sclerosis is present, with no evidence of aortic valve stenosis.  5. The inferior vena cava  is normal in size with greater than 50% respiratory variability, suggesting right atrial pressure of 3 mmHg. FINDINGS  Left Ventricle: Left ventricular ejection fraction, by estimation, is 60 to 65%. The left ventricle has normal function. The left ventricle has no regional wall motion abnormalities. The left ventricular internal cavity size was normal in size. There is  no left ventricular hypertrophy. Left ventricular diastolic parameters are indeterminate. Right Ventricle: The right ventricular size is normal. No increase in right ventricular wall thickness. Right ventricular systolic function is normal. Tricuspid regurgitation signal is inadequate for assessing PA pressure. Left Atrium: Left atrial size was normal in size. Right Atrium: Right atrial size was normal in size. Pericardium: There is no evidence of pericardial effusion. Mitral Valve: The mitral valve is normal in structure. No evidence of mitral valve regurgitation. No evidence of mitral valve stenosis. MV peak gradient, 6.0 mmHg. The mean mitral valve gradient is 2.0 mmHg. Tricuspid Valve: The tricuspid valve is normal in structure. Tricuspid valve regurgitation is not demonstrated. No evidence of tricuspid stenosis. Aortic Valve: The aortic valve is normal in structure. Aortic valve regurgitation is mild to moderate. Aortic valve sclerosis is present, with no evidence of aortic valve stenosis. Aortic valve  mean gradient measures 6.0 mmHg. Aortic valve peak gradient measures 12.8 mmHg. Aortic valve area, by VTI measures 2.03 cm. Pulmonic Valve: The pulmonic valve was normal in structure. Pulmonic valve regurgitation is not visualized. No evidence of pulmonic stenosis. Aorta: The aortic root is normal in size and structure. Venous: The inferior vena cava is normal in size with greater than 50% respiratory variability, suggesting right atrial pressure of 3 mmHg. IAS/Shunts: No atrial level shunt detected by color flow Doppler.  LEFT VENTRICLE PLAX 2D LVIDd:         4.50 cm   Diastology LVIDs:         2.70 cm   LV e' medial:    9.03 cm/s LV PW:         0.60 cm   LV E/e' medial:  8.9 LV IVS:        1.00 cm   LV e' lateral:   8.27 cm/s LVOT diam:     1.90 cm   LV E/e' lateral: 9.7 LV SV:         81 LV SV Index:   50 LVOT Area:     2.84 cm  RIGHT VENTRICLE RV Basal diam:  3.35 cm RV Mid diam:    2.90 cm RV S prime:     12.30 cm/s LEFT ATRIUM           Index        RIGHT ATRIUM          Index LA diam:      3.00 cm 1.87 cm/m   RA Area:     9.39 cm LA Vol (A2C): 44.6 ml 27.86 ml/m  RA Volume:   20.30 ml 12.68 ml/m LA Vol (A4C): 12.8 ml 7.99 ml/m  AORTIC VALVE                     PULMONIC VALVE AV Area (Vmax):    1.85 cm      PV Vmax:       0.80 m/s AV Area (Vmean):   1.72 cm      PV Peak grad:  2.6 mmHg AV Area (VTI):     2.03 cm AV Vmax:           179.00  cm/s AV Vmean:          114.000 cm/s AV VTI:            0.398 m AV Peak Grad:      12.8 mmHg AV Mean Grad:      6.0 mmHg LVOT Vmax:         117.00 cm/s LVOT Vmean:        69.300 cm/s LVOT VTI:          0.285 m LVOT/AV VTI ratio: 0.72  AORTA Ao Root diam: 3.10 cm MITRAL VALVE MV Area (PHT): 3.93 cm    SHUNTS MV Area VTI:   2.19 cm    Systemic VTI:  0.29 m MV Peak grad:  6.0 mmHg    Systemic Diam: 1.90 cm MV Mean grad:  2.0 mmHg MV Vmax:       1.22 m/s MV Vmean:      65.0 cm/s MV Decel Time: 193 msec MV E velocity: 80.20 cm/s MV A velocity: 58.70 cm/s MV E/A ratio:   1.37 Julien Nordmann MD Electronically signed by Julien Nordmann MD Signature Date/Time: 09/20/2023/5:16:43 PM    Final    CT Angio Chest PE W and/or Wo Contrast Result Date: 09/19/2023 CLINICAL DATA:  Acute shortness of breath.  History of COPD. EXAM: CT ANGIOGRAPHY CHEST WITH CONTRAST TECHNIQUE: Multidetector CT imaging of the chest was performed using the standard protocol during bolus administration of intravenous contrast. Multiplanar CT image reconstructions and MIPs were obtained to evaluate the vascular anatomy. RADIATION DOSE REDUCTION: This exam was performed according to the departmental dose-optimization program which includes automated exposure control, adjustment of the mA and/or kV according to patient size and/or use of iterative reconstruction technique. CONTRAST:  75mL OMNIPAQUE IOHEXOL 350 MG/ML SOLN COMPARISON:  CT of the chest on 07/07/2005 FINDINGS: Cardiovascular: The pulmonary arteries are adequately opacified. There is no evidence of pulmonary embolism. Central pulmonary arteries are of normal caliber. The thoracic aorta is normal in caliber. Mild aortic atherosclerosis. The heart size is normal. No pericardial fluid identified. Scattered calcified coronary artery plaque present. Mediastinum/Nodes: No enlarged mediastinal, hilar, or axillary lymph nodes. Thyroid gland, trachea, and esophagus demonstrate no significant findings. Lungs/Pleura: Moderate emphysematous lung disease with mild scattered pulmonary scarring bilaterally. There is no evidence of pulmonary edema, consolidation, pneumothorax, nodule or pleural fluid. Upper Abdomen: Evidence of hepatic steatosis. Musculoskeletal: No chest wall abnormality. No acute or significant osseous findings. Review of the MIP images confirms the above findings. IMPRESSION: 1. No evidence of pulmonary embolism or other acute findings in the chest. 2. Moderate emphysematous lung disease with mild scattered pulmonary scarring bilaterally. 3. Hepatic  steatosis. 4. Aortic atherosclerosis. Aortic Atherosclerosis (ICD10-I70.0) and Emphysema (ICD10-J43.9). Electronically Signed   By: Irish Lack M.D.   On: 09/19/2023 15:49   DG Chest Portable 1 View Result Date: 09/19/2023 CLINICAL DATA:  Shortness of breath. EXAM: PORTABLE CHEST 1 VIEW COMPARISON:  06/20/2023. FINDINGS: Bilateral lung fields are clear. Bilateral costophrenic angles are clear. Normal cardio-mediastinal silhouette. No acute osseous abnormalities. The soft tissues are within normal limits. IMPRESSION: No active disease. Electronically Signed   By: Jules Schick M.D.   On: 09/19/2023 13:48      Subjective: Seen and examined on the day of discharge.  Stable no distress.  Appropriate for discharge home.  Discharge Exam: Vitals:   09/23/23 0404 09/23/23 0828  BP: (!) 141/74 (!) 145/73  Pulse: 66 77  Resp: 19 18  Temp: 97.9 F (36.6 C) 97.6 F (  36.4 C)  SpO2: 95% 98%   Vitals:   09/22/23 2017 09/22/23 2324 09/23/23 0404 09/23/23 0828  BP:  (!) 131/58 (!) 141/74 (!) 145/73  Pulse:  69 66 77  Resp:  19 19 18   Temp:  98.4 F (36.9 C) 97.9 F (36.6 C) 97.6 F (36.4 C)  TempSrc:  Oral Oral   SpO2: 97% 96% 95% 98%  Weight:      Height:        General: Pt is alert, awake, not in acute distress Cardiovascular: RRR, S1/S2 +, no rubs, no gallops Respiratory: CTA bilaterally, no wheezing, no rhonchi Abdominal: Soft, NT, ND, bowel sounds + Extremities: no edema, no cyanosis    The results of significant diagnostics from this hospitalization (including imaging, microbiology, ancillary and laboratory) are listed below for reference.     Microbiology: Recent Results (from the past 240 hours)  Resp panel by RT-PCR (RSV, Flu A&B, Covid) Anterior Nasal Swab     Status: None   Collection Time: 09/19/23 11:02 AM   Specimen: Anterior Nasal Swab  Result Value Ref Range Status   SARS Coronavirus 2 by RT PCR NEGATIVE NEGATIVE Final    Comment: (NOTE) SARS-CoV-2 target  nucleic acids are NOT DETECTED.  The SARS-CoV-2 RNA is generally detectable in upper respiratory specimens during the acute phase of infection. The lowest concentration of SARS-CoV-2 viral copies this assay can detect is 138 copies/mL. A negative result does not preclude SARS-Cov-2 infection and should not be used as the sole basis for treatment or other patient management decisions. A negative result may occur with  improper specimen collection/handling, submission of specimen other than nasopharyngeal swab, presence of viral mutation(s) within the areas targeted by this assay, and inadequate number of viral copies(<138 copies/mL). A negative result must be combined with clinical observations, patient history, and epidemiological information. The expected result is Negative.  Fact Sheet for Patients:  BloggerCourse.com  Fact Sheet for Healthcare Providers:  SeriousBroker.it  This test is no t yet approved or cleared by the [place] FDA and  has been authorized for detection and/or diagnosis of SARS-CoV-2 by FDA under an Emergency Use Authorization (EUA). This EUA will remain  in effect (meaning this test can be used) for the duration of the COVID-19 declaration under Section 564(b)(1) of the Act, 21 U.S.C.section 360bbb-3(b)(1), unless the authorization is terminated  or revoked sooner.       Influenza A by PCR NEGATIVE NEGATIVE Final   Influenza B by PCR NEGATIVE NEGATIVE Final    Comment: (NOTE) The Xpert Xpress SARS-CoV-2/FLU/RSV plus assay is intended as an aid in the diagnosis of influenza from Nasopharyngeal swab specimens and should not be used as a sole basis for treatment. Nasal washings and aspirates are unacceptable for Xpert Xpress SARS-CoV-2/FLU/RSV testing.  Fact Sheet for Patients: BloggerCourse.com  Fact Sheet for Healthcare  Providers: SeriousBroker.it  This test is not yet approved or cleared by the [place] FDA and has been authorized for detection and/or diagnosis of SARS-CoV-2 by FDA under an Emergency Use Authorization (EUA). This EUA will remain in effect (meaning this test can be used) for the duration of the COVID-19 declaration under Section 564(b)(1) of the Act, 21 U.S.C. section 360bbb-3(b)(1), unless the authorization is terminated or revoked.     Resp Syncytial Virus by PCR NEGATIVE NEGATIVE Final    Comment: (NOTE) Fact Sheet for Patients: BloggerCourse.com  Fact Sheet for Healthcare Providers: SeriousBroker.it  This test is not yet approved or cleared by the  [place] [employer] trader and has been authorized for detection and/or diagnosis of SARS-CoV-2 by FDA under an TEFL teacher (EUA). This EUA will remain in effect (meaning this test can be used) for the duration of the COVID-19 declaration under Section 564(b)(1) of the Act, 21 U.S.C. section 360bbb-3(b)(1), unless the authorization is terminated or revoked.  Performed at [facility], [address] [place], [place] [address]      Labs: BNP (last 3 results) Recent Labs    09/19/23 1102  BNP 137.3*   Basic Metabolic Panel: Recent Labs  Lab 09/19/23 1102 09/20/23 0550 09/21/23 0633 09/22/23 0443 09/23/23 0309  NA 128* 130* 128* 131* 130*  K 4.3 4.4 4.2 3.3* 4.3  CL 93* 99 96* 95* 95*  CO2 23 19* 24 26 27   GLUCOSE 150* 109* 115* 82 122*  BUN 6* 9 11 10 10   CREATININE 0.60 0.61 0.64 0.51 0.52  CALCIUM 9.4 8.8* 8.5* 8.3* 8.6*   Liver Function Tests: Recent Labs  Lab 09/19/23 1102 09/20/23 0550  AST 34 28  ALT 26 19  ALKPHOS 104 75  BILITOT 0.5 0.5  PROT 7.0 5.9*  ALBUMIN 4.1 3.3*   No results for input(s): "LIPASE", "AMYLASE" in the last 168 hours. No results for input(s): "AMMONIA" in the last 168  hours. CBC: Recent Labs  Lab 09/19/23 1102 09/20/23 0550 09/21/23 0633 09/22/23 0443 09/23/23 0309  WBC 6.5 7.0 12.4* 10.3 7.7  NEUTROABS 4.9  --  11.1* 6.5 7.2  HGB 14.7 13.0 11.5* 11.4* 11.8*  HCT 41.7 38.4 32.7* 32.6* 34.0*  MCV 94.8 97.7 92.6 93.9 94.7  PLT 446* 387 349 349 368   Cardiac Enzymes: No results for input(s): "CKTOTAL", "CKMB", "CKMBINDEX", "TROPONINI" in the last 168 hours. BNP: Invalid input(s): "POCBNP" CBG: No results for input(s): "GLUCAP" in the last 168 hours. D-Dimer No results for input(s): "DDIMER" in the last 72 hours. Hgb A1c No results for input(s): "HGBA1C" in the last 72 hours. Lipid Profile No results for input(s): "CHOL", "HDL", "LDLCALC", "TRIG", "CHOLHDL", "LDLDIRECT" in the last 72 hours. Thyroid function studies No results for input(s): "TSH", "T4TOTAL", "T3FREE", "THYROIDAB" in the last 72 hours.  Invalid input(s): "FREET3" Anemia work up No results for input(s): "VITAMINB12", "FOLATE", "FERRITIN", "TIBC", "IRON", "RETICCTPCT" in the last 72 hours. Urinalysis No results found for: "COLORURINE", "APPEARANCEUR", "LABSPEC", "PHURINE", "GLUCOSEU", "HGBUR", "BILIRUBINUR", "KETONESUR", "PROTEINUR", "UROBILINOGEN", "NITRITE", "LEUKOCYTESUR" Sepsis Labs Recent Labs  Lab 09/20/23 0550 09/21/23 0633 09/22/23 0443 09/23/23 0309  WBC 7.0 12.4* 10.3 7.7   Microbiology Recent Results (from the past 240 hours)  Resp panel by RT-PCR (RSV, Flu A&B, Covid) Anterior Nasal Swab     Status: None   Collection Time: 09/19/23 11:02 AM   Specimen: Anterior Nasal Swab  Result Value Ref Range Status   SARS Coronavirus 2 by RT PCR NEGATIVE NEGATIVE Final    Comment: (NOTE) SARS-CoV-2 target nucleic acids are NOT DETECTED.  The SARS-CoV-2 RNA is generally detectable in upper respiratory specimens during the acute phase of infection. The lowest concentration of SARS-CoV-2 viral copies this assay can detect is 138 copies/mL. A negative result does not  preclude SARS-Cov-2 infection and should not be used as the sole basis for treatment or other patient management decisions. A negative result may occur with  improper specimen collection/handling, submission of specimen other than nasopharyngeal swab, presence of viral mutation(s) within the areas targeted by this assay, and inadequate number of viral copies(<138 copies/mL). A negative result must be combined with  clinical observations, patient history, and epidemiological information. The expected result is Negative.  Fact Sheet for Patients:  BloggerCourse.com  Fact Sheet for Healthcare Providers:  SeriousBroker.it  This test is no t yet approved or cleared by the [place] FDA and  has been authorized for detection and/or diagnosis of SARS-CoV-2 by FDA under an Emergency Use Authorization (EUA). This EUA will remain  in effect (meaning this test can be used) for the duration of the COVID-19 declaration under Section 564(b)(1) of the Act, 21 U.S.C.section 360bbb-3(b)(1), unless the authorization is terminated  or revoked sooner.       Influenza A by PCR NEGATIVE NEGATIVE Final   Influenza B by PCR NEGATIVE NEGATIVE Final    Comment: (NOTE) The Xpert Xpress SARS-CoV-2/FLU/RSV plus assay is intended as an aid in the diagnosis of influenza from Nasopharyngeal swab specimens and should not be used as a sole basis for treatment. Nasal washings and aspirates are unacceptable for Xpert Xpress SARS-CoV-2/FLU/RSV testing.  Fact Sheet for Patients: BloggerCourse.com  Fact Sheet for Healthcare Providers: SeriousBroker.it  This test is not yet approved or cleared by the [place] FDA and has been authorized for detection and/or diagnosis of SARS-CoV-2 by FDA under an Emergency Use Authorization (EUA). This EUA will remain in effect (meaning this test can be used) for the  duration of the COVID-19 declaration under Section 564(b)(1) of the Act, 21 U.S.C. section 360bbb-3(b)(1), unless the authorization is terminated or revoked.     Resp Syncytial Virus by PCR NEGATIVE NEGATIVE Final    Comment: (NOTE) Fact Sheet for Patients: BloggerCourse.com  Fact Sheet for Healthcare Providers: SeriousBroker.it  This test is not yet approved or cleared by the [place] FDA and has been authorized for detection and/or diagnosis of SARS-CoV-2 by FDA under an Emergency Use Authorization (EUA). This EUA will remain in effect (meaning this test can be used) for the duration of the COVID-19 declaration under Section 564(b)(1) of the Act, 21 U.S.C. section 360bbb-3(b)(1), unless the authorization is terminated or revoked.  Performed at [facility], [address], [place], [place] [address]      Time coordinating discharge: Over 30 minutes  SIGNED:   Tresa Moore, MD  [facility] 09/23/2023, 1:51 PM Pager   If 7PM-7AM, please contact night-coverage

## 2023-09-23 NOTE — Plan of Care (Signed)

## 2023-09-23 NOTE — Progress Notes (Signed)
Reviewed and provided AVS to patient. PIV removed. All questions answered.

## 2023-09-29 ENCOUNTER — Ambulatory Visit: Payer: Managed Care, Other (non HMO) | Admitting: Cardiology

## 2023-09-30 ENCOUNTER — Other Ambulatory Visit: Payer: Self-pay | Admitting: Family

## 2023-10-02 ENCOUNTER — Other Ambulatory Visit: Payer: Self-pay

## 2023-10-02 ENCOUNTER — Other Ambulatory Visit: Payer: Self-pay | Admitting: Cardiology

## 2023-10-03 ENCOUNTER — Other Ambulatory Visit: Payer: Self-pay | Admitting: Internal Medicine

## 2023-10-03 DIAGNOSIS — J438 Other emphysema: Secondary | ICD-10-CM

## 2023-10-03 MED ORDER — TRELEGY ELLIPTA 100-62.5-25 MCG/ACT IN AEPB: 1.0000 | INHALATION_SPRAY | Freq: Every day | RESPIRATORY_TRACT | 6 refills | Status: DC

## 2023-10-03 MED ORDER — TRELEGY ELLIPTA 100-62.5-25 MCG/ACT IN AEPB: 1.0000 | INHALATION_SPRAY | Freq: Every day | RESPIRATORY_TRACT | 2 refills | Status: DC

## 2023-10-13 ENCOUNTER — Encounter: Payer: Self-pay | Admitting: Emergency Medicine

## 2023-10-13 ENCOUNTER — Other Ambulatory Visit: Payer: Self-pay

## 2023-10-13 ENCOUNTER — Inpatient Hospital Stay
Admission: EM | Admit: 2023-10-13 | Discharge: 2023-10-18 | DRG: 190 | Disposition: A | Discharge status: Home Health | Payer: Managed Care, Other (non HMO) | Attending: Student | Admitting: Student

## 2023-10-13 ENCOUNTER — Emergency Department: Payer: Managed Care, Other (non HMO)

## 2023-10-13 DIAGNOSIS — Z8249 Family history of ischemic heart disease and other diseases of the circulatory system: Secondary | ICD-10-CM

## 2023-10-13 DIAGNOSIS — E559 Vitamin D deficiency, unspecified: Secondary | ICD-10-CM | POA: Diagnosis present

## 2023-10-13 DIAGNOSIS — E222 Syndrome of inappropriate secretion of antidiuretic hormone: Secondary | ICD-10-CM | POA: Diagnosis present

## 2023-10-13 DIAGNOSIS — Z881 Allergy status to other antibiotic agents status: Secondary | ICD-10-CM | POA: Diagnosis not present

## 2023-10-13 DIAGNOSIS — F32A Depression, unspecified: Secondary | ICD-10-CM | POA: Diagnosis present

## 2023-10-13 DIAGNOSIS — E119 Type 2 diabetes mellitus without complications: Secondary | ICD-10-CM | POA: Diagnosis present

## 2023-10-13 DIAGNOSIS — I10 Essential (primary) hypertension: Secondary | ICD-10-CM | POA: Diagnosis present

## 2023-10-13 DIAGNOSIS — E871 Hypo-osmolality and hyponatremia: Secondary | ICD-10-CM | POA: Diagnosis present

## 2023-10-13 DIAGNOSIS — E7800 Pure hypercholesterolemia, unspecified: Secondary | ICD-10-CM | POA: Diagnosis present

## 2023-10-13 DIAGNOSIS — F419 Anxiety disorder, unspecified: Secondary | ICD-10-CM | POA: Diagnosis present

## 2023-10-13 DIAGNOSIS — Z888 Allergy status to other drugs, medicaments and biological substances status: Secondary | ICD-10-CM | POA: Diagnosis not present

## 2023-10-13 DIAGNOSIS — Z1152 Encounter for screening for COVID-19: Secondary | ICD-10-CM | POA: Diagnosis not present

## 2023-10-13 DIAGNOSIS — J9601 Acute respiratory failure with hypoxia: Secondary | ICD-10-CM | POA: Diagnosis present

## 2023-10-13 DIAGNOSIS — I2510 Atherosclerotic heart disease of native coronary artery without angina pectoris: Secondary | ICD-10-CM | POA: Diagnosis present

## 2023-10-13 DIAGNOSIS — J441 Chronic obstructive pulmonary disease with (acute) exacerbation: Secondary | ICD-10-CM | POA: Diagnosis present

## 2023-10-13 DIAGNOSIS — J440 Chronic obstructive pulmonary disease with acute lower respiratory infection: Secondary | ICD-10-CM | POA: Diagnosis not present

## 2023-10-13 DIAGNOSIS — R7989 Other specified abnormal findings of blood chemistry: Secondary | ICD-10-CM | POA: Diagnosis present

## 2023-10-13 DIAGNOSIS — R631 Polydipsia: Secondary | ICD-10-CM | POA: Diagnosis present

## 2023-10-13 DIAGNOSIS — F17210 Nicotine dependence, cigarettes, uncomplicated: Secondary | ICD-10-CM | POA: Diagnosis present

## 2023-10-13 DIAGNOSIS — J438 Other emphysema: Secondary | ICD-10-CM

## 2023-10-13 DIAGNOSIS — J449 Chronic obstructive pulmonary disease, unspecified: Secondary | ICD-10-CM | POA: Diagnosis present

## 2023-10-13 DIAGNOSIS — Z79899 Other long term (current) drug therapy: Secondary | ICD-10-CM

## 2023-10-13 LAB — CBC WITH DIFFERENTIAL/PLATELET
Abs Immature Granulocytes: 0.01 10*3/uL (ref 0.00–0.07)
Basophils Absolute: 0 10*3/uL (ref 0.0–0.1)
Basophils Relative: 1 %
Eosinophils Absolute: 0.1 10*3/uL (ref 0.0–0.5)
Eosinophils Relative: 1 %
HCT: 34.4 % — ABNORMAL LOW (ref 36.0–46.0)
Hemoglobin: 12.3 g/dL (ref 12.0–15.0)
Immature Granulocytes: 0 %
Lymphocytes Relative: 23 %
Lymphs Abs: 1.2 10*3/uL (ref 0.7–4.0)
MCH: 32.9 pg (ref 26.0–34.0)
MCHC: 35.8 g/dL (ref 30.0–36.0)
MCV: 92 fL (ref 80.0–100.0)
Monocytes Absolute: 0.6 10*3/uL (ref 0.1–1.0)
Monocytes Relative: 12 %
Neutro Abs: 3.3 10*3/uL (ref 1.7–7.7)
Neutrophils Relative %: 63 %
Platelets: 321 10*3/uL (ref 150–400)
RBC: 3.74 MIL/uL — ABNORMAL LOW (ref 3.87–5.11)
RDW: 12.1 % (ref 11.5–15.5)
WBC: 5.3 10*3/uL (ref 4.0–10.5)
nRBC: 0 % (ref 0.0–0.2)

## 2023-10-13 LAB — URINALYSIS, ROUTINE W REFLEX MICROSCOPIC
Bilirubin Urine: NEGATIVE
Glucose, UA: NEGATIVE mg/dL
Hgb urine dipstick: NEGATIVE
Ketones, ur: NEGATIVE mg/dL
Nitrite: NEGATIVE
Protein, ur: NEGATIVE mg/dL
Specific Gravity, Urine: 1.002 — ABNORMAL LOW (ref 1.005–1.030)
pH: 6 (ref 5.0–8.0)

## 2023-10-13 LAB — COMPREHENSIVE METABOLIC PANEL
ALT: 25 U/L (ref 0–44)
AST: 30 U/L (ref 15–41)
Albumin: 3.5 g/dL (ref 3.5–5.0)
Alkaline Phosphatase: 81 U/L (ref 38–126)
Anion gap: 12 (ref 5–15)
BUN: <5 mg/dL — ABNORMAL LOW (ref 8–23)
CO2: 22 mmol/L (ref 22–32)
Calcium: 8.6 mg/dL — ABNORMAL LOW (ref 8.9–10.3)
Chloride: 89 mmol/L — ABNORMAL LOW (ref 98–111)
Creatinine, Ser: 0.49 mg/dL (ref 0.44–1.00)
GFR, Estimated: >60 mL/min (ref >60)
Glucose, Bld: 122 mg/dL — ABNORMAL HIGH (ref 70–99)
Potassium: 4.5 mmol/L (ref 3.5–5.1)
Sodium: 123 mmol/L — ABNORMAL LOW (ref 135–145)
Total Bilirubin: 0.6 mg/dL (ref 0.0–1.2)
Total Protein: 5.8 g/dL — ABNORMAL LOW (ref 6.5–8.1)

## 2023-10-13 LAB — BLOOD GAS, VENOUS
Acid-base deficit: 0.4 mmol/L (ref 0.0–2.0)
Bicarbonate: 24.8 mmol/L (ref 20.0–28.0)
O2 Saturation: 81.5 %
Patient temperature: 37
pCO2, Ven: 42 mm[Hg] — ABNORMAL LOW (ref 44–60)
pH, Ven: 7.38 (ref 7.25–7.43)
pO2, Ven: 50 mm[Hg] — ABNORMAL HIGH (ref 32–45)

## 2023-10-13 LAB — TROPONIN I (HIGH SENSITIVITY)
Troponin I (High Sensitivity): 8 ng/L (ref <18)
Troponin I (High Sensitivity): 66 ng/L — ABNORMAL HIGH (ref <18)
Troponin I (High Sensitivity): 67 ng/L — ABNORMAL HIGH (ref <18)

## 2023-10-13 LAB — LACTIC ACID, PLASMA
Lactic Acid, Venous: 1.1 mmol/L (ref 0.5–1.9)
Lactic Acid, Venous: 1.2 mmol/L (ref 0.5–1.9)

## 2023-10-13 LAB — RESP PANEL BY RT-PCR (RSV, FLU A&B, COVID)  RVPGX2
Influenza A by PCR: NEGATIVE
Influenza B by PCR: NEGATIVE
Resp Syncytial Virus by PCR: NEGATIVE
SARS Coronavirus 2 by RT PCR: NEGATIVE

## 2023-10-13 LAB — LIPASE, BLOOD: Lipase: 28 U/L (ref 11–51)

## 2023-10-13 LAB — SODIUM: Sodium: 126 mmol/L — ABNORMAL LOW (ref 135–145)

## 2023-10-13 MED ORDER — UMECLIDINIUM BROMIDE 62.5 MCG/ACT IN AEPB
1.0000 | INHALATION_SPRAY | Freq: Every day | RESPIRATORY_TRACT | Status: DC
Start: 2023-10-14 — End: 2023-10-18
  Administered 2023-10-14 – 2023-10-18 (×5): 1 via RESPIRATORY_TRACT
  Filled 2023-10-13 (×3): qty 7

## 2023-10-13 MED ORDER — ENOXAPARIN SODIUM 40 MG/0.4ML IJ SOSY
40.0000 mg | PREFILLED_SYRINGE | INTRAMUSCULAR | Status: DC
  Administered 2023-10-13 – 2023-10-17 (×5): 40 mg via SUBCUTANEOUS
  Filled 2023-10-13 (×5): qty 0.4

## 2023-10-13 MED ORDER — ONDANSETRON HCL 4 MG/2ML IJ SOLN
4.0000 mg | Freq: Once | INTRAMUSCULAR | Status: AC
  Administered 2023-10-13: 4 mg via INTRAVENOUS
  Filled 2023-10-13: qty 2

## 2023-10-13 MED ORDER — METHYLPREDNISOLONE SODIUM SUCC 40 MG IJ SOLR
40.0000 mg | Freq: Two times a day (BID) | INTRAMUSCULAR | Status: AC
  Administered 2023-10-13 (×2): 40 mg via INTRAVENOUS
  Filled 2023-10-13 (×2): qty 1

## 2023-10-13 MED ORDER — SODIUM CHLORIDE 0.9 % IV BOLUS
1000.0000 mL | Freq: Once | INTRAVENOUS | Status: AC
  Administered 2023-10-13: 1000 mL via INTRAVENOUS

## 2023-10-13 MED ORDER — ALBUTEROL SULFATE (2.5 MG/3ML) 0.083% IN NEBU
2.5000 mg | INHALATION_SOLUTION | Freq: Four times a day (QID) | RESPIRATORY_TRACT | Status: DC | PRN
  Administered 2023-10-13 – 2023-10-16 (×2): 2.5 mg via RESPIRATORY_TRACT
  Filled 2023-10-13 (×2): qty 3

## 2023-10-13 MED ORDER — PREDNISONE 20 MG PO TABS
40.0000 mg | ORAL_TABLET | Freq: Every day | ORAL | Status: DC
  Administered 2023-10-14: 40 mg via ORAL
  Filled 2023-10-13: qty 2

## 2023-10-13 MED ORDER — IPRATROPIUM-ALBUTEROL 0.5-2.5 (3) MG/3ML IN SOLN
3.0000 mL | Freq: Once | RESPIRATORY_TRACT | Status: AC
  Administered 2023-10-13: 3 mL via RESPIRATORY_TRACT
  Filled 2023-10-13: qty 3

## 2023-10-13 MED ORDER — ACETAMINOPHEN 325 MG PO TABS
650.0000 mg | ORAL_TABLET | Freq: Four times a day (QID) | ORAL | Status: DC | PRN
  Administered 2023-10-14 – 2023-10-17 (×3): 650 mg via ORAL
  Filled 2023-10-13 (×3): qty 2

## 2023-10-13 MED ORDER — ONDANSETRON HCL 4 MG PO TABS
4.0000 mg | ORAL_TABLET | Freq: Four times a day (QID) | ORAL | Status: DC | PRN
Start: 2023-10-13 — End: 2023-10-18

## 2023-10-13 MED ORDER — CITALOPRAM HYDROBROMIDE 20 MG PO TABS
40.0000 mg | ORAL_TABLET | Freq: Every day | ORAL | Status: DC
  Administered 2023-10-13 – 2023-10-18 (×6): 40 mg via ORAL
  Filled 2023-10-13 (×6): qty 2

## 2023-10-13 MED ORDER — QUETIAPINE FUMARATE 25 MG PO TABS
12.5000 mg | ORAL_TABLET | Freq: Two times a day (BID) | ORAL | Status: DC
  Administered 2023-10-13 – 2023-10-18 (×11): 12.5 mg via ORAL
  Filled 2023-10-13 (×11): qty 1

## 2023-10-13 MED ORDER — ROSUVASTATIN CALCIUM 10 MG PO TABS
10.0000 mg | ORAL_TABLET | Freq: Every day | ORAL | Status: DC
  Administered 2023-10-14 – 2023-10-18 (×5): 10 mg via ORAL
  Filled 2023-10-13 (×5): qty 1

## 2023-10-13 MED ORDER — NICOTINE 21 MG/24HR TD PT24
21.0000 mg | MEDICATED_PATCH | TRANSDERMAL | Status: DC
  Administered 2023-10-13 – 2023-10-18 (×6): 21 mg via TRANSDERMAL
  Filled 2023-10-13 (×7): qty 1

## 2023-10-13 MED ORDER — IPRATROPIUM-ALBUTEROL 0.5-2.5 (3) MG/3ML IN SOLN: 3.0000 mL | Freq: Four times a day (QID) | RESPIRATORY_TRACT | Status: DC | PRN

## 2023-10-13 MED ORDER — ONDANSETRON HCL 4 MG/2ML IJ SOLN: 4.0000 mg | Freq: Four times a day (QID) | INTRAMUSCULAR | Status: DC | PRN

## 2023-10-13 MED ORDER — LORAZEPAM 1 MG PO TABS
1.0000 mg | ORAL_TABLET | Freq: Three times a day (TID) | ORAL | Status: DC | PRN
  Administered 2023-10-13 – 2023-10-18 (×7): 1 mg via ORAL
  Filled 2023-10-13 (×6): qty 1
  Filled 2023-10-13: qty 2

## 2023-10-13 MED ORDER — LORAZEPAM 2 MG/ML IJ SOLN
1.0000 mg | Freq: Once | INTRAMUSCULAR | Status: AC
Start: 2023-10-13 — End: 2023-10-13
  Administered 2023-10-13: 1 mg via INTRAVENOUS
  Filled 2023-10-13: qty 1

## 2023-10-13 MED ORDER — RAMIPRIL 5 MG PO CAPS
10.0000 mg | ORAL_CAPSULE | Freq: Every day | ORAL | Status: DC
  Administered 2023-10-14 – 2023-10-18 (×5): 10 mg via ORAL
  Filled 2023-10-13: qty 2
  Filled 2023-10-13: qty 1
  Filled 2023-10-13 (×4): qty 2

## 2023-10-13 MED ORDER — BUDESONIDE 0.5 MG/2ML IN SUSP
2.0000 mg | Freq: Two times a day (BID) | RESPIRATORY_TRACT | Status: DC
  Administered 2023-10-13 – 2023-10-14 (×3): 2 mg via RESPIRATORY_TRACT
  Filled 2023-10-13 (×4): qty 8

## 2023-10-13 MED ORDER — ACETAMINOPHEN 650 MG RE SUPP: 650.0000 mg | Freq: Four times a day (QID) | RECTAL | Status: DC | PRN

## 2023-10-13 MED ORDER — FLUTICASONE FUROATE-VILANTEROL 100-25 MCG/ACT IN AEPB
1.0000 | INHALATION_SPRAY | Freq: Every day | RESPIRATORY_TRACT | Status: DC
  Administered 2023-10-14 – 2023-10-18 (×4): 1 via RESPIRATORY_TRACT
  Filled 2023-10-13 (×3): qty 28

## 2023-10-13 MED ORDER — IPRATROPIUM-ALBUTEROL 0.5-2.5 (3) MG/3ML IN SOLN
3.0000 mL | Freq: Four times a day (QID) | RESPIRATORY_TRACT | Status: DC
  Administered 2023-10-13 – 2023-10-18 (×20): 3 mL via RESPIRATORY_TRACT
  Filled 2023-10-13 (×20): qty 3

## 2023-10-13 NOTE — ED Notes (Signed)
 MD made aware of critical troponin 8 to 66.

## 2023-10-13 NOTE — H&P (Signed)
 History and Physical    ANMOL PASCHEN FMW:[id number] DOB: 04/30/1961 DOA: 10/13/2023  PCP: Fernand Fredy RAMAN, MD (Confirm with patient/family/NH records and if not entered, this has to be entered at [facility], The point of entry) Patient coming from: Home  I have personally briefly reviewed patient's old medical records in [facility]  Chief Complaint: Cough, wheezing, SOB, feeling anxious  HPI: Christine Sparks is a 63 y.o. female with medical history significant of COPD, HTN, HLD, cigarette smoking, anxiety/depression, presented with recurrent cough wheezing shortness of breath.  Patient was recently hospitalized for similar presentation about 3 weeks ago, she was treated for COPD exacerbation discharge back home.  Patient reported that he went to stay with her cousin for 2 weeks and her condition gradually improved.  Last week he came back to her own home, and for last 3 days she started to have cough wheezing and shortness of breath.  She has been feeling very anxious and unable to sleep at night as well and  feels more thirsty and she been drinking 4 to 5 of 20 ounces water every day.  Denies any chest pain no fever or chills.  She called EMS this morning, ESM arrived at their home and found patient hypoxic patient was given IV Solu-Medrol  and DuoNeb treatment and placed on BiPAP.  ED Course: Afebrile, no tachycardia nonhypotensive continue to be on BiPAP at ED.  Chest x-ray showed no acute infiltrates.  Blood work showed sodium 123, creatinine 3.4, glucose 122, VBG showed 7.38/42/56.  Troponin 8> 66, EKG showed no acute ST changes.  Review of Systems: As per HPI otherwise 14 point review of systems negative.    Past Medical History:  Diagnosis Date   Anemia    Anxiety    Arthritis    Bronchitis, chronic obstructive (HCC)    Depression    Diabetes mellitus jan 2012   a1c 5.7, fasting glu 155   Dyspnea    with exertion   H/O bronchitis    Heart murmur    High cholesterol    Hypertension     Menopausal syndrome    Tobacco abuse     Past Surgical History:  Procedure Laterality Date   BREAST BIOPSY Left 07/26/2016   complex sclerosing lesion. Coil shape marker   BREAST LUMPECTOMY Left 08/17/2016   complex sclerosing lesion with focal ADH. Clear margins. The coil shaped biopsy marker remains    BREAST LUMPECTOMY WITH NEEDLE LOCALIZATION Left 08/17/2016   Procedure: BREAST LUMPECTOMY WITH NEEDLE LOCALIZATION;  Surgeon: Louanne KANDICE Muse, MD;  Location: [facility] ORS;  Service: General;  Laterality: Left;   CERVICAL CONE BIOPSY     DILATION AND CURETTAGE OF UTERUS     EXCISION / BIOPSY BREAST / NIPPLE / DUCT Left 08/17/2016   complex sclerosing lesion removed   LIPOMA EXCISION Right    benign tumor right leg   LYMPH NODE BIOPSY     TONSILLECTOMY       reports that she has been smoking cigarettes. She has never used smokeless tobacco. She reports current alcohol use. She reports that she does not use drugs.  Allergies  Allergen Reactions   Zithromax  [Azithromycin ] Nausea And Vomiting   Buspar  [Buspirone ] Other (See Comments)    Made her feel crazy    Family History  Problem Relation Age of Onset   Cancer Mother        melanoma   Cancer Father        Lung Cancer  Early death Brother    Heart disease Brother    Breast cancer Paternal Aunt 52   Colon cancer Other    Ovarian cancer Neg Hx    Diabetes Neg Hx      Prior to Admission medications   Medication Sig Start Date End Date Taking? Authorizing Provider  albuterol  (VENTOLIN  HFA) 108 (90 Base) MCG/ACT inhaler Inhale 2 puffs into the lungs every 6 (six) hours as needed for shortness of breath. 06/14/23   [provider]  citalopram  (CELEXA ) 40 MG tablet Take 1 tablet (40 mg total) by mouth daily. 06/28/23   Scoggins, Hospital Doctor, NP  ipratropium-albuterol  (DUONEB) 0.5-2.5 (3) MG/3ML SOLN Take 3 mLs by nebulization every 6 (six) hours as needed. 01/10/23   Boswell, Chelsa, NP  LORazepam  (ATIVAN ) 1 MG tablet  Take 1 tablet (1 mg total) by mouth 3 (three) times daily as needed for anxiety. 09/23/23   Jhonny Calvin NOVAK, MD  nicotine  (NICODERM CQ  - DOSED IN MG/24 HOURS) 21 mg/24hr patch Place 1 patch (21 mg total) onto the skin daily. 09/23/23 09/22/24  Jhonny Calvin NOVAK, MD  ramipril  (ALTACE ) 10 MG capsule TAKE 1 CAPSULE BY MOUTH ONCE DAILY 10/02/23   Scoggins, Hospital Doctor, NP  rosuvastatin  (CRESTOR ) 10 MG tablet Take 1 tablet (10 mg total) by mouth daily. 06/28/23   Scoggins, Amber, NP  TRELEGY ELLIPTA  100-62.5-25 MCG/ACT AEPB Inhale 1 puff into the lungs daily. 10/03/23   Fernand Fredy RAMAN, MD    Physical Exam: Vitals:   10/13/23 1030 10/13/23 1100 10/13/23 1145 10/13/23 1146  BP: (!) 118/53 (!) 151/57 (!) 107/92   Pulse: 84 86 (!) 106 (!) 102  Resp: 15 19 14 18   Temp:      TempSrc:      SpO2: 100% 100% 98% 97%  Weight:      Height:        Constitutional: NAD, calm, comfortable Vitals:   10/13/23 1030 10/13/23 1100 10/13/23 1145 10/13/23 1146  BP: (!) 118/53 (!) 151/57 (!) 107/92   Pulse: 84 86 (!) 106 (!) 102  Resp: 15 19 14 18   Temp:      TempSrc:      SpO2: 100% 100% 98% 97%  Weight:      Height:       Eyes: PERRL, lids and conjunctivae normal ENMT: Mucous membranes are moist. Posterior pharynx clear of any exudate or lesions.Normal dentition.  Neck: normal, supple, no masses, no thyromegaly Respiratory: Diminished breathing sound bilaterally, scattered crackles and scattered wheezing, increasing respiratory effort. No accessory muscle use.  Cardiovascular: Regular rate and rhythm, no murmurs / rubs / gallops. No extremity edema. 2+ pedal pulses. No carotid bruits.  Abdomen: no tenderness, no masses palpated. No hepatosplenomegaly. Bowel sounds positive.  Musculoskeletal: no clubbing / cyanosis. No joint deformity upper and lower extremities. Good ROM, no contractures. Normal muscle tone.  Skin: no rashes, lesions, ulcers. No induration Neurologic: CN 2-12 grossly intact. Sensation  intact, DTR normal. Strength 5/5 in all 4.  Psychiatric: Normal judgment and insight. Alert and oriented x 3. Normal mood.  Very anxious    Labs on Admission: I have personally reviewed following labs and imaging studies  CBC: Recent Labs  Lab 10/13/23 0850  WBC 5.3  NEUTROABS 3.3  HGB 12.3  HCT 34.4*  MCV 92.0  PLT 321   Basic Metabolic Panel: Recent Labs  Lab 10/13/23 0850  NA 123*  K 4.5  CL 89*  CO2 22  GLUCOSE 122*  BUN <5*  CREATININE 0.49  CALCIUM  8.6*   GFR: Estimated Creatinine Clearance: 53.7 mL/min (by C-G formula based on SCr of 0.49 mg/dL). Liver Function Tests: Recent Labs  Lab 10/13/23 0850  AST 30  ALT 25  ALKPHOS 81  BILITOT 0.6  PROT 5.8*  ALBUMIN 3.5   Recent Labs  Lab 10/13/23 0850  LIPASE 28   No results for input(s): AMMONIA in the last 168 hours. Coagulation Profile: No results for input(s): INR, PROTIME in the last 168 hours. Cardiac Enzymes: No results for input(s): CKTOTAL, CKMB, CKMBINDEX, TROPONINI in the last 168 hours. BNP (last 3 results) No results for input(s): PROBNP in the last 8760 hours. HbA1C: No results for input(s): HGBA1C in the last 72 hours. CBG: No results for input(s): GLUCAP in the last 168 hours. Lipid Profile: No results for input(s): CHOL, HDL, LDLCALC, TRIG, CHOLHDL, LDLDIRECT in the last 72 hours. Thyroid  Function Tests: No results for input(s): TSH, T4TOTAL, FREET4, T3FREE, THYROIDAB in the last 72 hours. Anemia Panel: No results for input(s): VITAMINB12, FOLATE, FERRITIN, TIBC, IRON, RETICCTPCT in the last 72 hours. Urine analysis: No results found for: COLORURINE, APPEARANCEUR, LABSPEC, PHURINE, GLUCOSEU, HGBUR, BILIRUBINUR, KETONESUR, PROTEINUR, UROBILINOGEN, NITRITE, LEUKOCYTESUR  Radiological Exams on Admission: DG Chest Port 1 View Result Date: 10/13/2023 CLINICAL DATA:  Shortness of breath.  History of COPD.  EXAM: PORTABLE CHEST 1 VIEW COMPARISON:  09/19/2023; 06/20/2023; chest CT-09/19/2023 FINDINGS: Unchanged cardiac silhouette and mediastinal contours with atherosclerotic plaque within the thoracic aorta. The lungs are hyperexpanded with flattening of the bilateral hemidiaphragms and blunting the bilateral costophrenic angles. Biapical pleural-parenchymal thickening. No discrete focal airspace opacities. No pleural effusion or pneumothorax. No evidence of edema. No acute osseous abnormalities. IMPRESSION: Similar findings of lung hyperexpansion without superimposed acute cardiopulmonary disease. Electronically Signed   By: Norleen Roulette M.D.   On: 10/13/2023 09:13    EKG: Independently reviewed.  Sinus rhythm, no acute ST changes.  Assessment/Plan Principal Problem:   COPD (chronic obstructive pulmonary disease) (HCC) Active Problems:   Anxiety   Hyponatremia  (please populate well all problems here in Problem List. (For example, if patient is on BP meds at home and you resume or decide to hold them, it is a problem that needs to be her. Same for CAD, COPD, HLD and so on)  Acute hypoxic respiratory failure Acute COPD exacerbation -Continue BiPAP support to relieve breathing effort -Continue IV Solu-Medrol  bridging for p.o. steroid -Continue ICS and LABA, DuoNebs and as needed albuterol  -Incentive telemetry  Elevated troponins -No chest pains, EKG showed no acute ST changes. -Clinically suspect elevated troponin secondary to demanding ischemia from acute COPD exacerbation. -Will continue to trend troponins -Echocardiogram was done on last admission, we will not repeat echocardiogram this time  Hyponatremia -Workup on last admission showed hyponatremia secondary to dilutional and excessive water drinking -Fluid restriction -Recheck sodium level tonight  Anxiety/depression -Poorly controlled, start low-dose of p.o. Seroquel  twice daily  HTN HLD -Stable, continue home BP meds and  statin  Cigarette smoking -Cessation education at bedside -Nicotine  patch  DVT prophylaxis: Lovenox  Code Status: Full code Family Communication: None at bedside Consults called: None Disposition Plan: Patient sick with acute COPD exacerbation and hypoxia requiring BiPAP support, expect more than 2 midnight hospital stay Admission status: PCU admit   Cort ONEIDA Mana MD [facility] Pager [phone]  10/13/2023, 12:05 PM

## 2023-10-13 NOTE — ED Notes (Signed)
 CCMD called and added to monitoring

## 2023-10-13 NOTE — ED Notes (Signed)
 Pt resting at this time. Pt stating that ativan helped calm pt. Pt reporting that her anxiety has decreased and that she is feeling better.

## 2023-10-13 NOTE — ED Provider Notes (Signed)
 [facility] Provider Note    Event Date/Time   First MD Initiated Contact with Patient 10/13/23 0840     (approximate)   History   Respiratory Distress   HPI  Christine Sparks is a 63 y.o. female with a history of COPD, hypertension, and hyperlipidemia who presents with shortness of breath and nausea.  The patient states that she has been feeling increasing shortness of breath over the last several days.  In addition she has had nausea and some diarrhea but no vomiting or abdominal pain.  She states that she is feeling quite anxious this morning.  She denies any acute pain.  I reviewed the past medical records.  The patient was admitted to the hospital service last month; per the hospitalist discharge summary she presented with decompensated respiratory failure requiring BiPAP and was treated for COPD exacerbation.   Physical Exam   Triage Vital Signs: ED Triage Vitals  Encounter Vitals Group     BP 10/13/23 0851 (!) 154/79     Systolic BP Percentile --      Diastolic BP Percentile --      Pulse Rate 10/13/23 0851 81     Resp 10/13/23 0851 18     Temp --      Temp src --      SpO2 10/13/23 0844 98 %     Weight 10/13/23 0852 105 lb 9.6 oz (47.9 kg)     Height 10/13/23 0852 5' 4 (1.626 m)     Head Circumference --      Peak Flow --      Pain Score 10/13/23 0852 0     Pain Loc --      Pain Education --      Exclude from Growth Chart --     Most recent vital signs: Vitals:   10/13/23 1000 10/13/23 1030  BP: 122/63 (!) 118/53  Pulse: 84 84  Resp: 15 15  Temp:    SpO2: 100% 100%     General: Awake, uncomfortable appearing, no distress.  CV:  Good peripheral perfusion.  Resp:  Respiratory effort with pursed lip breathing.  Diminished breath sounds bilaterally with wheezing. Abd:  Soft and nontender.  No distention. Other:  No peripheral edema.    ED Results / Procedures / Treatments   Labs (all labs ordered are listed, but only abnormal  results are displayed) Labs Reviewed  COMPREHENSIVE METABOLIC PANEL - Abnormal; Notable for the following components:      Result Value   Sodium 123 (*)    Chloride 89 (*)    Glucose, Bld 122 (*)    BUN <5 (*)    Calcium  8.6 (*)    Total Protein 5.8 (*)    All other components within normal limits  CBC WITH DIFFERENTIAL/PLATELET - Abnormal; Notable for the following components:   RBC 3.74 (*)    HCT 34.4 (*)    All other components within normal limits  BLOOD GAS, VENOUS - Abnormal; Notable for the following components:   pCO2, Ven 42 (*)    pO2, Ven 50 (*)    All other components within normal limits  RESP PANEL BY RT-PCR (RSV, FLU A&B, COVID)  RVPGX2  LIPASE, BLOOD  LACTIC ACID, PLASMA  LACTIC ACID, PLASMA  URINALYSIS, ROUTINE W REFLEX MICROSCOPIC  TROPONIN I (HIGH SENSITIVITY)  TROPONIN I (HIGH SENSITIVITY)     EKG  ED ECG REPORT I, Waylon Cassis, the attending physician, personally viewed and interpreted  this ECG.  Date: 10/13/2023 EKG Time: 0847 Rate: 86 Rhythm: normal sinus rhythm QRS Axis: Borderline right axis  Intervals: normal ST/T Wave abnormalities: normal Narrative Interpretation: no evidence of acute ischemia    RADIOLOGY  Chest x-ray: I independently viewed and interpreted the images; there is no focal consolidation or edema  PROCEDURES:  Critical Care performed: Yes, see critical care procedure note(s)  .Critical Care  Performed by: Jacolyn Pae, MD Authorized by: Jacolyn Pae, MD   Critical care provider statement:    Critical care time (minutes):  30   Critical care time was exclusive of:  Separately billable procedures and treating other patients   Critical care was necessary to treat or prevent imminent or life-threatening deterioration of the following conditions:  Respiratory failure   Critical care was time spent personally by me on the following activities:  Development of treatment plan with patient or surrogate,  discussions with consultants, evaluation of patient's response to treatment, examination of patient, ordering and review of laboratory studies, ordering and review of radiographic studies, ordering and performing treatments and interventions, pulse oximetry, re-evaluation of patient's condition, review of old charts and obtaining history from patient or surrogate   Care discussed with: admitting provider      MEDICATIONS ORDERED IN ED: Medications  QUEtiapine  (SEROQUEL ) tablet 12.5 mg (has no administration in time range)  ondansetron  (ZOFRAN ) injection 4 mg (4 mg Intravenous Given 10/13/23 0854)  ipratropium-albuterol  (DUONEB) 0.5-2.5 (3) MG/3ML nebulizer solution 3 mL (3 mLs Nebulization Given 10/13/23 0854)  LORazepam  (ATIVAN ) injection 1 mg (1 mg Intravenous Given 10/13/23 0855)  sodium chloride  0.9 % bolus 1,000 mL (1,000 mLs Intravenous New Bag/Given 10/13/23 1018)     IMPRESSION / MDM / ASSESSMENT AND PLAN / ED COURSE  I reviewed the triage vital signs and the nursing notes.  63 year old female with PMH as noted above presents with shortness of breath over the last several days associated with nausea Rock, as well as with anxiety today.  EMS gave DuoNebs and IV Solu-Medrol .  On exam, the patient is uncomfortable appearing with increased work of breathing and has some wheezing bilaterally.  Abdomen soft and nontender.  Differential diagnosis includes, but is not limited to, COPD exacerbation, COVID-19, RSV, or other viral syndrome, acute bronchitis, bacterial pneumonia, other acute infection, less likely cardiac etiology.  We will place the patient on BiPAP and give additional bronchodilators, obtain chest x-ray, lab workup, and reassess.  Patient's presentation is most consistent with acute presentation with potential threat to life or bodily function.  The patient is on the cardiac monitor to evaluate for evidence of arrhythmia and/or significant heart rate changes.    ----------------------------------------- 11:12 AM on 10/13/2023 -----------------------------------------  The patient is doing well on BiPAP.  Her respiratory effort is much improved.  Lab workup so far is reassuring.  pCO2 is only 42.  Sodium is 123 so I have ordered a liter of NS.  Respiratory panel is negative.  Chest x-ray shows no acute findings.  Overall presentation is most consistent with COPD exacerbation.  The patient will need inpatient admission for further workup and treatment.  I consulted Dr. Laurita from the hospitalist service; based on our discussion he agrees to evaluate the patient for admission.  FINAL CLINICAL IMPRESSION(S) / ED DIAGNOSES   Final diagnoses:  COPD exacerbation (HCC)     Rx / DC Orders   ED Discharge Orders     None        Note:  This document was  prepared using Conservation officer, historic buildings and may include unintentional dictation errors.    Jacolyn Pae, MD 10/13/23 1114

## 2023-10-13 NOTE — ED Notes (Signed)
 Turned nasal cannula O2 to 2L pt was sating mid 80s. Pt now at 98%

## 2023-10-13 NOTE — Progress Notes (Signed)
 Trop trending 8>66>67, patient remains chest pain free. No escalation of current management.

## 2023-10-13 NOTE — ED Notes (Signed)
 Turned O2 off pt sating at 98

## 2023-10-13 NOTE — ED Triage Notes (Signed)
 Pt to ED via [facility] from home for Respiratory Distress. EMS reports pt has been having shortness of breath for the last few days. Pt has hx/o COPD. Pt also reporting dizziness, nausea, and decreased appetite. Pt received 2 dou neb treatments in route, as well as Solu-Medrol  125 mg and Zofran  4 mg. Pt maintaining her O2 sat but does have increased work of breathing.

## 2023-10-14 DIAGNOSIS — J440 Chronic obstructive pulmonary disease with acute lower respiratory infection: Secondary | ICD-10-CM | POA: Diagnosis not present

## 2023-10-14 LAB — RESPIRATORY PANEL BY PCR

## 2023-10-14 LAB — CBC
HCT: 32.7 % — ABNORMAL LOW (ref 36.0–46.0)
Hemoglobin: 11.4 g/dL — ABNORMAL LOW (ref 12.0–15.0)
MCH: 33 pg (ref 26.0–34.0)
MCHC: 34.9 g/dL (ref 30.0–36.0)
MCV: 94.8 fL (ref 80.0–100.0)
Platelets: 301 10*3/uL (ref 150–400)
RBC: 3.45 MIL/uL — ABNORMAL LOW (ref 3.87–5.11)
RDW: 12.4 % (ref 11.5–15.5)
WBC: 3.8 10*3/uL — ABNORMAL LOW (ref 4.0–10.5)
nRBC: 0 % (ref 0.0–0.2)

## 2023-10-14 LAB — IRON AND TIBC
Iron: 66 ug/dL (ref 28–170)
Saturation Ratios: 24 % (ref 10.4–31.8)
TIBC: 281 ug/dL (ref 250–450)
UIBC: 215 ug/dL

## 2023-10-14 LAB — BASIC METABOLIC PANEL
Anion gap: 7 (ref 5–15)
BUN: 6 mg/dL — ABNORMAL LOW (ref 8–23)
CO2: 24 mmol/L (ref 22–32)
Calcium: 8.8 mg/dL — ABNORMAL LOW (ref 8.9–10.3)
Chloride: 96 mmol/L — ABNORMAL LOW (ref 98–111)
Creatinine, Ser: 0.54 mg/dL (ref 0.44–1.00)
GFR, Estimated: >60 mL/min (ref >60)
Glucose, Bld: 134 mg/dL — ABNORMAL HIGH (ref 70–99)
Potassium: 4.4 mmol/L (ref 3.5–5.1)
Sodium: 127 mmol/L — ABNORMAL LOW (ref 135–145)

## 2023-10-14 LAB — PHOSPHORUS: Phosphorus: 4 mg/dL (ref 2.5–4.6)

## 2023-10-14 LAB — MAGNESIUM: Magnesium: 1.8 mg/dL (ref 1.7–2.4)

## 2023-10-14 LAB — OSMOLALITY: Osmolality: 265 mosm/kg — ABNORMAL LOW (ref 275–295)

## 2023-10-14 LAB — FOLATE: Folate: 10.8 ng/mL (ref >5.9)

## 2023-10-14 MED ORDER — PREDNISONE 20 MG PO TABS: 30.0000 mg | ORAL_TABLET | Freq: Every day | ORAL | Status: DC

## 2023-10-14 MED ORDER — PREDNISONE 20 MG PO TABS
20.0000 mg | ORAL_TABLET | Freq: Every day | ORAL | Status: DC
Start: 2023-10-22 — End: 2023-10-25

## 2023-10-14 MED ORDER — PREDNISONE 20 MG PO TABS
40.0000 mg | ORAL_TABLET | Freq: Every day | ORAL | Status: AC
  Administered 2023-10-16 – 2023-10-18 (×3): 40 mg via ORAL
  Filled 2023-10-14 (×3): qty 2

## 2023-10-14 MED ORDER — METHYLPREDNISOLONE SODIUM SUCC 40 MG IJ SOLR
40.0000 mg | Freq: Two times a day (BID) | INTRAMUSCULAR | Status: AC
  Administered 2023-10-14 – 2023-10-15 (×3): 40 mg via INTRAVENOUS
  Filled 2023-10-14 (×3): qty 1

## 2023-10-14 MED ORDER — HYDROCOD POLI-CHLORPHE POLI ER 10-8 MG/5ML PO SUER
5.0000 mL | Freq: Two times a day (BID) | ORAL | Status: DC | PRN
  Administered 2023-10-15: 5 mL via ORAL
  Filled 2023-10-14: qty 5

## 2023-10-14 MED ORDER — PREDNISONE 10 MG PO TABS
10.0000 mg | ORAL_TABLET | Freq: Every day | ORAL | Status: DC
Start: 2023-10-25 — End: 2023-10-28

## 2023-10-14 MED ORDER — GUAIFENESIN ER 600 MG PO TB12
600.0000 mg | ORAL_TABLET | Freq: Two times a day (BID) | ORAL | Status: DC
  Administered 2023-10-14 – 2023-10-18 (×9): 600 mg via ORAL
  Filled 2023-10-14 (×9): qty 1

## 2023-10-14 NOTE — ED Notes (Signed)
 Pt ambulated to bedside toilet with one person assist with tech and this RN. Pt was returned to bed without incident and given water per request. NAD, CB within reach.

## 2023-10-14 NOTE — Progress Notes (Signed)
 [facility] Progress Note  Patient: Christine Sparks    FMW:[id number]  DOA: 10/13/2023     Date of Service: the patient was seen and examined on 10/14/2023  Chief Complaint  Patient presents with   Respiratory Distress   Brief hospital course: Christine Sparks is a 63 y.o. female with medical history significant of COPD, HTN, HLD, cigarette smoking, anxiety/depression, presented with recurrent cough wheezing shortness of breath.  COPD exacerbation.  Patient had similar presentation 3 weeks ago.  She stayed well for 2 weeks and then started having progressive shortness of breath with wheezing and productive cough, getting worse for past 3 days. She called EMS this morning, ESM arrived at their home and found patient hypoxic patient was given IV Solu-Medrol  and DuoNeb treatment and placed on BiPAP.    ED Course: Afebrile, no tachycardia nonhypotensive continue to be on BiPAP at ED.  Chest x-ray showed no acute infiltrates.  Blood work showed sodium 123, creatinine 3.4, glucose 122, VBG showed 7.38/42/56.  Troponin 8> 66, EKG showed no acute ST changes.   Assessment and Plan: # Acute hypoxic respiratory failure # Acute COPD exacerbation -Continue BiPAP prn -Continue IV Solu-Medrol  40 BID x 2 days, then started tapering prednisone .  -Continue ICS and LABA, DuoNebs and as needed albuterol  -Incentive telemetry Mucinex  600 mg p.o. twice daily, Tussionex as needed for cough.  Elevated troponins -No chest pains, EKG showed no acute ST changes. -Clinically suspect elevated troponin secondary to demanding ischemia from acute COPD exacerbation. -Echocardiogram was done on last admission, we will not repeat echocardiogram this time Patient remained chest pain-free.   Hyponatremia -Workup on last admission showed hyponatremia secondary to dilutional and excessive water drinking -Fluid restriction -Recheck sodium level tonight Follow serum osmolarity  Anxiety/depression -Poorly controlled,  start low-dose of p.o. Seroquel  twice daily   HTN HLD -Stable, continue home BP meds and statin   Cigarette smoking -Cessation education at bedside -Nicotine  patch   Body mass index is 18.13 kg/m.  Interventions:  Diet: Heart healthy diet DVT Prophylaxis: Subcutaneous Lovenox    Advance goals of care discussion: Full code  Family Communication: family was not present at bedside, at the time of interview.  The pt provided permission to discuss medical plan with the family. Opportunity was given to ask question and all questions were answered satisfactorily.   Disposition:  Pt is from Home, admitted with respiratory failure due to COPD, still has respiratory failure, which precludes a safe discharge. Discharge to home, when stable, may need few days to improve. Follow PT and OT eval, may need home health services TOC consulted   Subjective: No significant events overnight, patient feels a little bit improvement in the shortness of breath, still has chest congestion, productive cough.  No chest pain or palpitations. Patient is requesting PT and OT eval for possible home physical therapy.  Physical Exam: General: NAD, lying comfortably Appear in no distress, affect appropriate Eyes: PERRLA ENT: Oral Mucosa Clear, moist  Neck: no JVD,  Cardiovascular: S1 and S2 Present, no Murmur,  Respiratory: Equal air entry bilaterally, bilateral crackles and wheezing Abdomen: Bowel Sound present, Soft and no tenderness,  Skin: no rashes Extremities: no Pedal edema, no calf tenderness Neurologic: without any new focal findings Gait not checked due to patient safety concerns  Vitals:   10/14/23 0922 10/14/23 0930 10/14/23 1200 10/14/23 1339  BP: (!) 125/59 115/81 120/66   Pulse:  100 81   Resp:  15 18   Temp:  97.8 F (36.6 C)  TempSrc:    Oral  SpO2:  100% 99%   Weight:      Height:       No intake or output data in the 24 hours ending 10/14/23 1345 Filed Weights   10/13/23  0852  Weight: 47.9 kg    Data Reviewed: I have personally reviewed and interpreted daily labs, tele strips, imagings as discussed above. I reviewed all nursing notes, pharmacy notes, vitals, pertinent old records I have discussed plan of care as described above with RN and patient/family.  CBC: Recent Labs  Lab 10/13/23 0850 10/14/23 0129  WBC 5.3 3.8*  NEUTROABS 3.3  --   HGB 12.3 11.4*  HCT 34.4* 32.7*  MCV 92.0 94.8  PLT 321 301   Basic Metabolic Panel: Recent Labs  Lab 10/13/23 0850 10/13/23 1933 10/14/23 0129  NA 123* 126* 127*  K 4.5  --  4.4  CL 89*  --  96*  CO2 22  --  24  GLUCOSE 122*  --  134*  BUN <5*  --  6*  CREATININE 0.49  --  0.54  CALCIUM  8.6*  --  8.8*  MG  --   --  1.8  PHOS  --   --  4.0    Studies: No results found.  Scheduled Meds:  budesonide  (PULMICORT ) nebulizer solution  2 mg Nebulization Q12H   citalopram   40 mg Oral Daily   enoxaparin  (LOVENOX ) injection  40 mg Subcutaneous Q24H   fluticasone  furoate-vilanterol  1 puff Inhalation Daily   And   umeclidinium bromide   1 puff Inhalation Daily   guaiFENesin   600 mg Oral BID   ipratropium-albuterol   3 mL Nebulization Q6H   methylPREDNISolone  (SOLU-MEDROL ) injection  40 mg Intravenous Q12H   Followed by   NOREEN ON 10/16/2023] predniSONE   40 mg Oral Q breakfast   Followed by   NOREEN ON 10/19/2023] predniSONE   30 mg Oral Q breakfast   Followed by   NOREEN ON 10/22/2023] predniSONE   20 mg Oral Q breakfast   Followed by   NOREEN ON 10/25/2023] predniSONE   10 mg Oral Q breakfast   nicotine   21 mg Transdermal Q24H   QUEtiapine   12.5 mg Oral BID   ramipril   10 mg Oral Daily   rosuvastatin   10 mg Oral Daily   Continuous Infusions: PRN Meds: acetaminophen  **OR** acetaminophen , albuterol , chlorpheniramine-HYDROcodone , LORazepam , ondansetron  **OR** ondansetron  (ZOFRAN ) IV  Time spent: 35 minutes  Author: ELVAN SOR. MD [facility] 10/14/2023 1:45 PM  To reach On-call, see care  teams to locate the attending and reach out to them via [email or web address]. If 7PM-7AM, please contact night-coverage If you still have difficulty reaching the attending provider, please page the [facility] (Director on Call) for [facility] on amion for assistance.

## 2023-10-14 NOTE — ED Notes (Signed)
Pt given phone to call family.

## 2023-10-14 NOTE — ED Notes (Signed)
 Assisted pt to the bathroom. Pt had urinated in brief. This tech assisted pt with peri care and put a new brief on pt. Pt was assisted back to the bed with the help of Hailey RN.

## 2023-10-14 NOTE — ED Notes (Signed)
 This nurse changed pt brief and provided pericare. This nurse advised pt to start calling nurse in order for her to use the restroom. Pt verbalized understanding. Pt was able to ambulate around the bed. Pt returned to bed without incident. NAD, CB within reach.

## 2023-10-14 NOTE — ED Notes (Signed)
 Pt given toothpaste and toothbrush per request by this RN

## 2023-10-15 DIAGNOSIS — J441 Chronic obstructive pulmonary disease with (acute) exacerbation: Secondary | ICD-10-CM | POA: Diagnosis not present

## 2023-10-15 LAB — BASIC METABOLIC PANEL WITH GFR
Anion gap: 8 (ref 5–15)
BUN: 10 mg/dL (ref 8–23)
CO2: 24 mmol/L (ref 22–32)
Calcium: 8.5 mg/dL — ABNORMAL LOW (ref 8.9–10.3)
Chloride: 88 mmol/L — ABNORMAL LOW (ref 98–111)
Creatinine, Ser: 0.6 mg/dL (ref 0.44–1.00)
GFR, Estimated: >60 mL/min
Glucose, Bld: 120 mg/dL — ABNORMAL HIGH (ref 70–99)
Potassium: 4.5 mmol/L (ref 3.5–5.1)
Sodium: 123 mmol/L — ABNORMAL LOW (ref 135–145)

## 2023-10-15 LAB — CBC
HCT: 33 % — ABNORMAL LOW (ref 36.0–46.0)
Hemoglobin: 11.2 g/dL — ABNORMAL LOW (ref 12.0–15.0)
MCH: 32.6 pg (ref 26.0–34.0)
MCHC: 33.9 g/dL (ref 30.0–36.0)
MCV: 95.9 fL (ref 80.0–100.0)
Platelets: 312 K/uL (ref 150–400)
RBC: 3.44 MIL/uL — ABNORMAL LOW (ref 3.87–5.11)
RDW: 12.4 % (ref 11.5–15.5)
WBC: 11.1 K/uL — ABNORMAL HIGH (ref 4.0–10.5)
nRBC: 0 % (ref 0.0–0.2)

## 2023-10-15 LAB — MAGNESIUM: Magnesium: 1.9 mg/dL (ref 1.7–2.4)

## 2023-10-15 LAB — VITAMIN B12: Vitamin B-12: 260 pg/mL (ref 180–914)

## 2023-10-15 LAB — PHOSPHORUS: Phosphorus: 3.8 mg/dL (ref 2.5–4.6)

## 2023-10-15 LAB — VITAMIN D 25 HYDROXY (VIT D DEFICIENCY, FRACTURES): Vit D, 25-Hydroxy: 11.81 ng/mL — ABNORMAL LOW (ref 30–100)

## 2023-10-15 NOTE — Evaluation (Signed)
 Physical Therapy Evaluation Patient Details Name: Christine Sparks MRN: [id number] DOB: 1961-08-02 Today's Date: 10/15/2023  History of Present Illness  Pt admitted for COPD exacerbation. History includes COPD, HTN, HLD, and anxiety.  Clinical Impression  Pt is a pleasant 63 year old female who was admitted for COPD exacerbation. Pt reports feeling slightly better today, however unable to wean off O2 this date. Pt performs bed mobility with independence, transfers with mod I, and ambulation with supervision and RW. Pt demonstrates deficits with endurance/balance. Reports feeling more secure using RW; recommend to continue to use in home environment for energy conservation and balance. Would benefit from skilled PT to address above deficits and promote optimal return to PLOF. Pt will continue to receive skilled PT services while admitted and will defer to TOC/care team for updates regarding disposition planning.  SaO2 on room air at rest = 97% SaO2 on room air while ambulating = 83% SaO2 on 2 liters of O2 while ambulating = 96%         If plan is discharge home, recommend the following: Help with stairs or ramp for entrance;Assist for transportation;A little help with walking and/or transfers   Can travel by private vehicle        Equipment Recommendations None recommended by PT  Recommendations for Other Services       Functional Status Assessment Patient has had a recent decline in their functional status and demonstrates the ability to make significant improvements in function in a reasonable and predictable amount of time.     Precautions / Restrictions Precautions Precautions: None Restrictions Weight Bearing Restrictions Per Provider Order: No      Mobility  Bed Mobility Overal bed mobility: Independent             General bed mobility comments: safe technique    Transfers Overall transfer level: Modified independent Equipment used: None Transfers: Sit to/from  Stand Sit to Stand: Modified independent (Device/Increase time)           General transfer comment: safe technique. Able to get up from stretcher and low toilet with ease    Ambulation/Gait Ambulation/Gait assistance: Supervision Gait Distance (Feet): 200 Feet Assistive device: Rolling walker (2 wheels) Gait Pattern/deviations: Step-through pattern       General Gait Details: ambulated with RW secondary to endurance. Safe technique with 1 standing rest break performed. Vitals monitored throughout  Stairs            Wheelchair Mobility     Tilt Bed    Modified Rankin (Stroke Patients Only)       Balance Overall balance assessment: Mild deficits observed, not formally tested Sitting-balance support: Feet supported, No upper extremity supported Sitting balance-Leahy Scale: Normal     Standing balance support: Bilateral upper extremity supported Standing balance-Leahy Scale: Good                               Pertinent Vitals/Pain Pain Assessment Pain Assessment: No/denies pain    Home Living Family/patient expects to be discharged to:: Private residence Living Arrangements: Alone Available Help at Discharge: Other (Comment);Family;Available PRN/intermittently Type of Home: House Home Access: Stairs to enter Entrance Stairs-Rails: Left Entrance Stairs-Number of Steps: 4   Home Layout: One level Home Equipment: Shower seat;Cane - single point;Rolling Walker (2 wheels)      Prior Function Prior Level of Function : Independent/Modified Independent  Mobility Comments: reports ambulating with SPC more recently; was overall IND ADLs Comments: IND with ADLs/IADLs, was driving. Retired     Extremity/Trunk Assessment   Upper Extremity Assessment Upper Extremity Assessment: Overall WFL for tasks assessed    Lower Extremity Assessment Lower Extremity Assessment: Overall WFL for tasks assessed       Communication    Communication Communication: No apparent difficulties  Cognition Arousal: Alert Behavior During Therapy: WFL for tasks assessed/performed, Anxious Overall Cognitive Status: Within Functional Limits for tasks assessed                                 General Comments: pleasant and agreeable to session        General Comments      Exercises Other Exercises Other Exercises: ambulated to bathroom. Able to perform self hygiene with supervision.   Assessment/Plan    PT Assessment Patient needs continued PT services  PT Problem List Cardiopulmonary status limiting activity;Decreased strength;Decreased activity tolerance;Decreased balance;Decreased mobility       PT Treatment Interventions DME instruction;Gait training;Stair training;Functional mobility training;Therapeutic activities;Therapeutic exercise;Balance training    PT Goals (Current goals can be found in the Care Plan section)  Acute Rehab PT Goals Patient Stated Goal: go home today PT Goal Formulation: With patient Time For Goal Achievement: 10/29/23 Potential to Achieve Goals: Good    Frequency Min 1X/week     Co-evaluation               AM-PAC PT 6 Clicks Mobility  Outcome Measure Help needed turning from your back to your side while in a flat bed without using bedrails?: None Help needed moving from lying on your back to sitting on the side of a flat bed without using bedrails?: None Help needed moving to and from a bed to a chair (including a wheelchair)?: A Little Help needed standing up from a chair using your arms (e.g., wheelchair or bedside chair)?: A Little Help needed to walk in hospital room?: A Little Help needed climbing 3-5 steps with a railing? : A Little 6 Click Score: 20    End of Session Equipment Utilized During Treatment: Oxygen Activity Tolerance: Patient tolerated treatment well;Patient limited by fatigue Patient left: in bed Nurse Communication: Mobility status PT  Visit Diagnosis: Unsteadiness on feet (R26.81);Muscle weakness (generalized) (M62.81);Other abnormalities of gait and mobility (R26.89)    Time: 9055-8987 PT Time Calculation (min) (ACUTE ONLY): 28 min   Charges:   PT Evaluation $PT Eval Low Complexity: 1 Low PT Treatments $Gait Training: 8-22 mins PT General Charges $$ ACUTE PT VISIT: 1 Visit         Corean Dade, PT, DPT, GCS [phone]   Tariya Morrissette 10/15/2023, 11:48 AM

## 2023-10-15 NOTE — ED Notes (Signed)
 ..ED TO INPATIENT HANDOFF REPORT  ED Nurse Name and Phone #:  Andrea KIDD, RN  S Name/Age/Gender Christine Sparks 63 y.o. female Room/Bed: ED02A/ED02A  Code Status   Code Status: Full Code  Home/SNF/Other Home Patient oriented to: self, place, time, and situation Is this baseline? Yes   Triage Complete: Triage complete  Chief Complaint COPD (chronic obstructive pulmonary disease) (HCC) [J44.9]  Triage Note Pt to ED via [facility] from home for Respiratory Distress. EMS reports pt has been having shortness of breath for the last few days. Pt has hx/o COPD. Pt also reporting dizziness, nausea, and decreased appetite. Pt received 2 dou neb treatments in route, as well as Solu-Medrol  125 mg and Zofran  4 mg. Pt maintaining her O2 sat but does have increased work of breathing.    Allergies Allergies  Allergen Reactions   Zithromax  [Azithromycin ] Nausea And Vomiting   Buspar  [Buspirone ] Other (See Comments)    Made her feel crazy    Level of Care/Admitting Diagnosis ED Disposition     ED Disposition  Admit   Condition  --   Comment  Hospital Area: [facility] [[id number]]  Level of Care: Progressive [102]  Admit to Progressive based on following criteria: RESPIRATORY PROBLEMS hypoxemic/hypercapnic respiratory failure that is responsive to NIPPV (BiPAP) or High Flow Nasal Cannula (6-80 lpm). Frequent assessment/intervention, no > Q2 hrs < Q4 hrs, to maintain oxygenation and pulmonary hygiene.  Covid Evaluation: Confirmed COVID Negative  Diagnosis: COPD (chronic obstructive pulmonary disease) [facility]) [[id number]]  Admitting Physician: LAURITA CORT DASEN [[id number]]  Attending Physician: LAURITA CORT DASEN [[id number]]  Certification:: I certify this patient will need inpatient services for at least 2 midnights  Expected Medical Readiness: 10/15/2023          B Medical/Surgery History Past Medical History:  Diagnosis Date   Anemia    Anxiety    Arthritis    Bronchitis,  chronic obstructive (HCC)    Depression    Diabetes mellitus jan 2012   a1c 5.7, fasting glu 155   Dyspnea    with exertion   H/O bronchitis    Heart murmur    High cholesterol    Hypertension    Menopausal syndrome    Tobacco abuse    Past Surgical History:  Procedure Laterality Date   BREAST BIOPSY Left 07/26/2016   complex sclerosing lesion. Coil shape marker   BREAST LUMPECTOMY Left 08/17/2016   complex sclerosing lesion with focal ADH. Clear margins. The coil shaped biopsy marker remains    BREAST LUMPECTOMY WITH NEEDLE LOCALIZATION Left 08/17/2016   Procedure: BREAST LUMPECTOMY WITH NEEDLE LOCALIZATION;  Surgeon: Louanne KANDICE Muse, MD;  Location: [facility] ORS;  Service: General;  Laterality: Left;   CERVICAL CONE BIOPSY     DILATION AND CURETTAGE OF UTERUS     EXCISION / BIOPSY BREAST / NIPPLE / DUCT Left 08/17/2016   complex sclerosing lesion removed   LIPOMA EXCISION Right    benign tumor right leg   LYMPH NODE BIOPSY     TONSILLECTOMY       A IV Location/Drains/Wounds Patient Lines/Drains/Airways Status     Active Line/Drains/Airways     Name Placement date Placement time Site Days   Peripheral IV 10/13/23 18 G Left Forearm 10/13/23  0844  Forearm  2            Intake/Output Last 24 hours  Intake/Output Summary (Last 24 hours) at 10/15/2023 1607 Last data filed at 10/14/2023 1850 Gross per 24 hour  Intake 300 ml  Output --  Net 300 ml    Labs/Imaging Results for orders placed or performed during the hospital encounter of 10/13/23 (from the past 48 hours)  Sodium     Status: Abnormal   Collection Time: 10/13/23  7:33 PM  Result Value Ref Range   Sodium 126 (L) 135 - 145 mmol/L    Comment: Performed at [facility] [address] [place], [place] [address]  Basic metabolic panel     Status: Abnormal   Collection Time: 10/14/23  1:29 AM  Result Value Ref Range   Sodium 127 (L) 135 - 145 mmol/L   Potassium 4.4 3.5 - 5.1 mmol/L    Chloride 96 (L) 98 - 111 mmol/L   CO2 24 22 - 32 mmol/L   Glucose, Bld 134 (H) 70 - 99 mg/dL    Comment: Glucose reference range applies only to samples taken after fasting for at least 8 hours.   BUN 6 (L) 8 - 23 mg/dL   Creatinine, Ser 9.45 0.44 - 1.00 mg/dL   Calcium  8.8 (L) 8.9 - 10.3 mg/dL   GFR, Estimated >39 >39 mL/min    Comment: (NOTE) Calculated using the CKD-EPI Creatinine Equation (2021)    Anion gap 7 5 - 15    Comment: Performed at [facility], [address] [place], [place] [address]  CBC     Status: Abnormal   Collection Time: 10/14/23  1:29 AM  Result Value Ref Range   WBC 3.8 (L) 4.0 - 10.5 K/uL   RBC 3.45 (L) 3.87 - 5.11 MIL/uL   Hemoglobin 11.4 (L) 12.0 - 15.0 g/dL   HCT 67.2 (L) 63.9 - 53.9 %   MCV 94.8 80.0 - 100.0 fL   MCH 33.0 26.0 - 34.0 pg   MCHC 34.9 30.0 - 36.0 g/dL   RDW 87.5 88.4 - 84.4 %   Platelets 301 150 - 400 K/uL   nRBC 0.0 0.0 - 0.2 %    Comment: Performed at [facility], [address] [place], [place] [address]  Respiratory (~20 pathogens) panel by PCR     Status: None   Collection Time: 10/14/23  1:29 AM   Specimen: Nasopharyngeal Swab; Respiratory  Result Value Ref Range   Adenovirus NOT DETECTED NOT DETECTED   Coronavirus 229E NOT DETECTED NOT DETECTED    Comment: (NOTE) The Coronavirus on the Respiratory Panel, DOES NOT test for the novel  Coronavirus (2019 nCoV)    Coronavirus HKU1 NOT DETECTED NOT DETECTED   Coronavirus NL63 NOT DETECTED NOT DETECTED   Coronavirus OC43 NOT DETECTED NOT DETECTED   Metapneumovirus NOT DETECTED NOT DETECTED   Rhinovirus / Enterovirus NOT DETECTED NOT DETECTED   Influenza A NOT DETECTED NOT DETECTED   Influenza B NOT DETECTED NOT DETECTED   Parainfluenza Virus 1 NOT DETECTED NOT DETECTED   Parainfluenza Virus 2 NOT DETECTED NOT DETECTED   Parainfluenza Virus 3 NOT DETECTED NOT DETECTED   Parainfluenza Virus 4 NOT DETECTED NOT DETECTED   Respiratory Syncytial Virus NOT  DETECTED NOT DETECTED   Bordetella pertussis NOT DETECTED NOT DETECTED   Bordetella Parapertussis NOT DETECTED NOT DETECTED   Chlamydophila pneumoniae NOT DETECTED NOT DETECTED   Mycoplasma pneumoniae NOT DETECTED NOT DETECTED    Comment: Performed at [facility], [address] [place], [place] [address]  Osmolality     Status: Abnormal   Collection Time: 10/14/23  1:29 AM  Result Value Ref Range   Osmolality  265 (L) 275 - 295 mOsm/kg    Comment: REPEATED TO VERIFY Performed at [facility], [address] [place], [place] [address]   Iron and TIBC     Status: None   Collection Time: 10/14/23  1:29 AM  Result Value Ref Range   Iron 66 28 - 170 ug/dL   TIBC 718 749 - 549 ug/dL   Saturation Ratios 24 10.4 - 31.8 %   UIBC 215 ug/dL    Comment: Performed at [facility], [address], [place], [place] [address]  Folate     Status: None   Collection Time: 10/14/23  1:29 AM  Result Value Ref Range   Folate 10.8 >5.9 ng/mL    Comment: Performed at [facility], [address], [place], [place] [address]  Magnesium      Status: None   Collection Time: 10/14/23  1:29 AM  Result Value Ref Range   Magnesium  1.8 1.7 - 2.4 mg/dL    Comment: Performed at [facility], [address], [place], [place] [address]  Phosphorus     Status: None   Collection Time: 10/14/23  1:29 AM  Result Value Ref Range   Phosphorus 4.0 2.5 - 4.6 mg/dL    Comment: Performed at [facility], [address] [place], [place] [address]  Vitamin B12     Status: None   Collection Time: 10/14/23 11:23 PM  Result Value Ref Range   Vitamin B-12 260 180 - 914 pg/mL    Comment: (NOTE) This assay is not validated for testing neonatal or myeloproliferative syndrome specimens for Vitamin B12 levels. Performed at [facility], [address] [place], [place] [address]   VITAMIN D  25 Hydroxy (Vit-D Deficiency, Fractures)     Status: Abnormal    Collection Time: 10/14/23 11:23 PM  Result Value Ref Range   Vit D, 25-Hydroxy 11.81 (L) 30 - 100 ng/mL    Comment: (NOTE) Vitamin D  deficiency has been defined by the Institute of Medicine  and an Endocrine Society practice guideline as a level of serum 25-OH  vitamin D  less than 20 ng/mL (1,2). The Endocrine Society went on to  further define vitamin D  insufficiency as a level between 21 and 29  ng/mL (2).  1. IOM (Institute of Medicine). 2010. Dietary reference intakes for  calcium  and D. [place]: [employer]. 2. Holick MF, Binkley [place], Bischoff-Ferrari HA, et al. Evaluation,  treatment, and prevention of vitamin D  deficiency: an Endocrine  Society clinical practice guideline, JCEM. 2011 Jul; 96(7): 1911-30.  Performed at [facility], [address] [place], [place] [address]   Basic metabolic panel     Status: Abnormal   Collection Time: 10/15/23  3:29 AM  Result Value Ref Range   Sodium 123 (L) 135 - 145 mmol/L   Potassium 4.5 3.5 - 5.1 mmol/L   Chloride 88 (L) 98 - 111 mmol/L   CO2 24 22 - 32 mmol/L   Glucose, Bld 120 (H) 70 - 99 mg/dL    Comment: Glucose reference range applies only to samples taken after fasting for at least 8 hours.   BUN 10 8 - 23 mg/dL   Creatinine, Ser 9.39 0.44 - 1.00 mg/dL   Calcium  8.5 (L) 8.9 - 10.3 mg/dL   GFR, Estimated >39 >39 mL/min    Comment: (NOTE) Calculated using the CKD-EPI Creatinine Equation (2021)    Anion gap 8 5 - 15    Comment:  Performed at [facility], [address] [place], [place] [address]  CBC     Status: Abnormal   Collection Time: 10/15/23  3:29 AM  Result Value Ref Range   WBC 11.1 (H) 4.0 - 10.5 K/uL   RBC 3.44 (L) 3.87 - 5.11 MIL/uL   Hemoglobin 11.2 (L) 12.0 - 15.0 g/dL   HCT 66.9 (L) 63.9 - 53.9 %   MCV 95.9 80.0 - 100.0 fL   MCH 32.6 26.0 - 34.0 pg   MCHC 33.9 30.0 - 36.0 g/dL   RDW 87.5 88.4 - 84.4 %   Platelets 312 150 - 400 K/uL   nRBC 0.0 0.0 - 0.2 %     Comment: Performed at [facility], [address], [place], [place] [address]  Magnesium      Status: None   Collection Time: 10/15/23  3:29 AM  Result Value Ref Range   Magnesium  1.9 1.7 - 2.4 mg/dL    Comment: Performed at [facility], [address] [place], [place] [address]  Phosphorus     Status: None   Collection Time: 10/15/23  3:29 AM  Result Value Ref Range   Phosphorus 3.8 2.5 - 4.6 mg/dL    Comment: Performed at [facility], [address] [place], [place] [address]   No results found.  Pending Labs Unresulted Labs (From admission, onward)     Start     Ordered   10/15/23 0500  Basic metabolic panel  Daily,   R      10/14/23 0909   10/15/23 0500  CBC  Daily,   R      10/14/23 0909   10/15/23 0500  Magnesium   Daily,   R      10/14/23 0909   10/15/23 0500  Phosphorus  Daily,   R      10/14/23 0909            Vitals/Pain Today's Vitals   10/15/23 1330 10/15/23 1400 10/15/23 1500 10/15/23 1559  BP:  (!) 151/81 (!) 141/75   Pulse: 77 78 77   Resp: (!) 21 (!) 25 20   Temp:      TempSrc:      SpO2: 100% 100% 97%   Weight:      Height:      PainSc:    0-No pain    Isolation Precautions No active isolations  Medications Medications  QUEtiapine  (SEROQUEL ) tablet 12.5 mg (12.5 mg Oral Given 10/15/23 0916)  ramipril  (ALTACE ) capsule 10 mg (10 mg Oral Given 10/15/23 1043)  rosuvastatin  (CRESTOR ) tablet 10 mg (10 mg Oral Given 10/15/23 1040)  citalopram  (CELEXA ) tablet 40 mg (40 mg Oral Given 10/15/23 0916)  LORazepam  (ATIVAN ) tablet 1 mg (1 mg Oral Given 10/14/23 2315)  nicotine  (NICODERM CQ  - dosed in mg/24 hours) patch 21 mg (21 mg Transdermal Patch Applied 10/15/23 1302)  fluticasone  furoate-vilanterol (BREO ELLIPTA ) 100-25 MCG/ACT 1 puff (1 puff Inhalation Given 10/15/23 0747)    And  umeclidinium bromide  (INCRUSE ELLIPTA ) 62.5 MCG/ACT 1 puff (1 puff Inhalation Given 10/15/23 0748)  enoxaparin  (LOVENOX ) injection 40 mg (40 mg  Subcutaneous Given 10/14/23 2317)  ondansetron  (ZOFRAN ) tablet 4 mg (has no administration in time range)    Or  ondansetron  (ZOFRAN ) injection 4 mg (has no administration in time range)  acetaminophen  (TYLENOL ) tablet 650 mg (650 mg Oral Given 10/14/23 1706)    Or  acetaminophen  (TYLENOL ) suppository 650 mg ( Rectal See Alternative 10/14/23 1706)  ipratropium-albuterol  (DUONEB)  0.5-2.5 (3) MG/3ML nebulizer solution 3 mL (3 mLs Nebulization Given 10/15/23 1357)  albuterol  (PROVENTIL ) (2.5 MG/3ML) 0.083% nebulizer solution 2.5 mg (2.5 mg Inhalation Given 10/13/23 1314)  guaiFENesin  (MUCINEX ) 12 hr tablet 600 mg (600 mg Oral Given 10/15/23 1041)  chlorpheniramine-HYDROcodone  (TUSSIONEX) 10-8 MG/5ML suspension 5 mL (has no administration in time range)  methylPREDNISolone  sodium succinate (SOLU-MEDROL ) 40 mg/mL injection 40 mg (40 mg Intravenous Given 10/15/23 0916)    Followed by  predniSONE  (DELTASONE ) tablet 40 mg (has no administration in time range)    Followed by  predniSONE  (DELTASONE ) tablet 30 mg (has no administration in time range)    Followed by  predniSONE  (DELTASONE ) tablet 20 mg (has no administration in time range)    Followed by  predniSONE  (DELTASONE ) tablet 10 mg (has no administration in time range)  ondansetron  (ZOFRAN ) injection 4 mg (4 mg Intravenous Given 10/13/23 0854)  ipratropium-albuterol  (DUONEB) 0.5-2.5 (3) MG/3ML nebulizer solution 3 mL (3 mLs Nebulization Given 10/13/23 0854)  LORazepam  (ATIVAN ) injection 1 mg (1 mg Intravenous Given 10/13/23 0855)  sodium chloride  0.9 % bolus 1,000 mL (0 mLs Intravenous Stopped 10/13/23 1114)  methylPREDNISolone  sodium succinate (SOLU-MEDROL ) 40 mg/mL injection 40 mg (40 mg Intravenous Given 10/13/23 2302)    Mobility walks with person assist     Focused Assessments Pulmonary Assessment Handoff:  Lung sounds: Bilateral Breath Sounds: Clear L Breath Sounds: Clear R Breath Sounds: Clear O2 Device: Nasal Cannula O2 Flow Rate  (L/min): 2 L/min    R Recommendations: See Admitting Provider Note  Report given to:   Additional Notes:

## 2023-10-15 NOTE — ED Notes (Signed)
 Pt up to the bathroom at this time. Gait steady. Standby assist provided.

## 2023-10-15 NOTE — Progress Notes (Signed)
 PROGRESS NOTE    Christine Sparks  FMW:[id number]  DOB: 03-10-1961  DOA: 10/13/2023 PCP: Fernand Fredy RAMAN, MD Outpatient Specialists:   Hospital course:  63 year old female with known COPD, HTN and ongoing tobacco abuse was admitted 1/10 with COPD flare.  Patient has been treated with steroids and inhaled bronchodilator with good response.  Subjective:  Patient states she feels much better today than she did yesterday.  Notes that she is almost 10% better.  She does not use oxygen at home but thinks that she does need it but has never been worked up for that as an outpatient.   Objective: Vitals:   10/15/23 1301 10/15/23 1315 10/15/23 1330 10/15/23 1400  BP:    (!) 151/81  Pulse:  81 77 78  Resp:  (!) 38 (!) 21 (!) 25  Temp: 98 F (36.7 C)     TempSrc: Oral     SpO2:  100% 100% 100%  Weight:      Height:        Intake/Output Summary (Last 24 hours) at 10/15/2023 1507 Last data filed at 10/14/2023 1850 Gross per 24 hour  Intake 300 ml  Output --  Net 300 ml   Filed Weights   10/13/23 0852  Weight: 47.9 kg     Exam:  General: Patient sitting up in stretcher with mildly labored tachypnea, able to speak in short sentences without difficulty Eyes: sclera anicteric, conjuctiva mild injection bilaterally CVS: S1-S2, regular  Respiratory: Reasonable air entry bilaterally, no wheezes or rales. GI: NABS, soft, NT  LE: Warm and well-perfused Neuro: A/O x 3,  grossly nonfocal.  Psych: patient is logical and coherent, judgement and insight appear normal, mood and affect appropriate to situation.  Data Reviewed:  Basic Metabolic Panel: Recent Labs  Lab 10/13/23 0850 10/13/23 1933 10/14/23 0129 10/15/23 0329  NA 123* 126* 127* 123*  K 4.5  --  4.4 4.5  CL 89*  --  96* 88*  CO2 22  --  24 24  GLUCOSE 122*  --  134* 120*  BUN <5*  --  6* 10  CREATININE 0.49  --  0.54 0.60  CALCIUM  8.6*  --  8.8* 8.5*  MG  --   --  1.8 1.9  PHOS  --   --  4.0 3.8     CBC: Recent Labs  Lab 10/13/23 0850 10/14/23 0129 10/15/23 0329  WBC 5.3 3.8* 11.1*  NEUTROABS 3.3  --   --   HGB 12.3 11.4* 11.2*  HCT 34.4* 32.7* 33.0*  MCV 92.0 94.8 95.9  PLT 321 301 312     Scheduled Meds:  citalopram   40 mg Oral Daily   enoxaparin  (LOVENOX ) injection  40 mg Subcutaneous Q24H   fluticasone  furoate-vilanterol  1 puff Inhalation Daily   And   umeclidinium bromide   1 puff Inhalation Daily   guaiFENesin   600 mg Oral BID   ipratropium-albuterol   3 mL Nebulization Q6H   methylPREDNISolone  (SOLU-MEDROL ) injection  40 mg Intravenous Q12H   Followed by   NOREEN ON 10/16/2023] predniSONE   40 mg Oral Q breakfast   Followed by   NOREEN ON 10/19/2023] predniSONE   30 mg Oral Q breakfast   Followed by   NOREEN ON 10/22/2023] predniSONE   20 mg Oral Q breakfast   Followed by   NOREEN ON 10/25/2023] predniSONE   10 mg Oral Q breakfast   nicotine   21 mg Transdermal Q24H   QUEtiapine   12.5 mg Oral BID   ramipril   10 mg Oral Daily   rosuvastatin   10 mg Oral Daily   Continuous Infusions:   Assessment & Plan:   Acute hypoxic respiratory failure COPD exacerbation ongoing tobacco use Patient responded well to present therapeutic regimen Continue steroids, inhaled bronchodilators Wean oxygen as tolerated Patient advised to stop smoking, nicotine  patch  Hyponatremia 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  Principal Problem:   COPD (chronic obstructive pulmonary disease) (HCC) Active Problems:   Anxiety   Hyponatremia     Christine Sparks, [facility]  If 7PM-7AM, please contact night-coverage [email or web address]   LOS: 2 days

## 2023-10-16 ENCOUNTER — Inpatient Hospital Stay: Payer: Managed Care, Other (non HMO)

## 2023-10-16 DIAGNOSIS — J441 Chronic obstructive pulmonary disease with (acute) exacerbation: Secondary | ICD-10-CM

## 2023-10-16 LAB — BASIC METABOLIC PANEL
Anion gap: 7 (ref 5–15)
BUN: 9 mg/dL (ref 8–23)
CO2: 27 mmol/L (ref 22–32)
Calcium: 8.4 mg/dL — ABNORMAL LOW (ref 8.9–10.3)
Chloride: 87 mmol/L — ABNORMAL LOW (ref 98–111)
Creatinine, Ser: 0.57 mg/dL (ref 0.44–1.00)
GFR, Estimated: >60 mL/min (ref >60)
Glucose, Bld: 152 mg/dL — ABNORMAL HIGH (ref 70–99)
Potassium: 4.6 mmol/L (ref 3.5–5.1)
Sodium: 121 mmol/L — ABNORMAL LOW (ref 135–145)

## 2023-10-16 LAB — CBC
HCT: 29.7 % — ABNORMAL LOW (ref 36.0–46.0)
Hemoglobin: 10.6 g/dL — ABNORMAL LOW (ref 12.0–15.0)
MCH: 32.5 pg (ref 26.0–34.0)
MCHC: 35.7 g/dL (ref 30.0–36.0)
MCV: 91.1 fL (ref 80.0–100.0)
Platelets: 312 10*3/uL (ref 150–400)
RBC: 3.26 MIL/uL — ABNORMAL LOW (ref 3.87–5.11)
RDW: 11.9 % (ref 11.5–15.5)
WBC: 7.4 10*3/uL (ref 4.0–10.5)
nRBC: 0 % (ref 0.0–0.2)

## 2023-10-16 LAB — HEPATIC FUNCTION PANEL
ALT: 26 U/L (ref 0–44)
AST: 31 U/L (ref 15–41)
Albumin: 3.1 g/dL — ABNORMAL LOW (ref 3.5–5.0)
Alkaline Phosphatase: 50 U/L (ref 38–126)
Bilirubin, Direct: <0.1 mg/dL (ref 0.0–0.2)
Total Bilirubin: 0.5 mg/dL (ref 0.0–1.2)
Total Protein: 5 g/dL — ABNORMAL LOW (ref 6.5–8.1)

## 2023-10-16 LAB — MAGNESIUM: Magnesium: 1.9 mg/dL (ref 1.7–2.4)

## 2023-10-16 LAB — SODIUM: Sodium: 124 mmol/L — ABNORMAL LOW (ref 135–145)

## 2023-10-16 LAB — PHOSPHORUS: Phosphorus: 3.5 mg/dL (ref 2.5–4.6)

## 2023-10-16 MED ORDER — VITAMIN D (ERGOCALCIFEROL) 1.25 MG (50000 UNIT) PO CAPS
50000.0000 [IU] | ORAL_CAPSULE | ORAL | Status: DC
Start: 2023-10-16 — End: 2023-12-11
  Administered 2023-10-16: 50000 [IU] via ORAL
  Filled 2023-10-16: qty 1

## 2023-10-16 MED ORDER — VITAMIN B-12 1000 MCG PO TABS
1000.0000 ug | ORAL_TABLET | Freq: Every day | ORAL | Status: DC
Start: 2023-10-23 — End: 2023-10-18

## 2023-10-16 MED ORDER — TOLVAPTAN 15 MG PO TABS
15.0000 mg | ORAL_TABLET | Freq: Once | ORAL | Status: AC
  Administered 2023-10-16: 15 mg via ORAL
  Filled 2023-10-16: qty 1

## 2023-10-16 MED ORDER — GUAIFENESIN-DM 100-10 MG/5ML PO SYRP: 5.0000 mL | ORAL_SOLUTION | ORAL | Status: DC | PRN

## 2023-10-16 MED ORDER — CYANOCOBALAMIN 1000 MCG/ML IJ SOLN
1000.0000 ug | Freq: Every day | INTRAMUSCULAR | Status: DC
  Administered 2023-10-16 – 2023-10-18 (×3): 1000 ug via INTRAMUSCULAR
  Filled 2023-10-16 (×3): qty 1

## 2023-10-16 NOTE — Progress Notes (Signed)
 [facility] Progress Note  Patient: Christine Sparks    FMW:[id number]  DOA: 10/13/2023     Date of Service: the patient was seen and examined on 10/16/2023  Chief Complaint  Patient presents with   Respiratory Distress   Brief hospital course: Christine Sparks is a 63 y.o. female with medical history significant of COPD, HTN, HLD, cigarette smoking, anxiety/depression, presented with recurrent cough wheezing shortness of breath.  COPD exacerbation.  Patient had similar presentation 3 weeks ago.  She stayed well for 2 weeks and then started having progressive shortness of breath with wheezing and productive cough, getting worse for past 3 days. She called EMS this morning, [facility] arrived at their home and found patient hypoxic patient was given IV Solu-Medrol  and DuoNeb treatment and placed on BiPAP.    ED Course: Afebrile, no tachycardia nonhypotensive continue to be on BiPAP at ED.  Chest x-ray showed no acute infiltrates.  Blood work showed sodium 123, creatinine 3.4, glucose 122, VBG showed 7.38/42/56.  Troponin 8> 66, EKG showed no acute ST changes.   Assessment and Plan: # Acute hypoxic respiratory failure # Acute COPD exacerbation -Continue BiPAP prn -Continue IV Solu-Medrol  40 BID x 2 days, then started tapering prednisone .  -Continue ICS and LABA, DuoNebs and as needed albuterol  -Incentive telemetry Mucinex  600 mg p.o. twice daily,  # Elevated troponins -No chest pains, EKG showed no acute ST changes. -Clinically suspect elevated troponin secondary to demanding ischemia from acute COPD exacerbation. -Echocardiogram was done on last admission, we will not repeat echocardiogram this time Patient remained chest pain-free.   # Hypotonic hyponatremia -Workup on last admission showed hyponatremia secondary to dilutional and excessive water drinking -Continue fluid restriction Monitor sodium level daily Na 121  Nephrology consulted, tolvaptan  15 mg one-time dose given   #  Anxiety/depression -Poorly controlled, start low-dose of p.o. Seroquel  twice daily   # HTN and HLD -Stable, continue home BP meds and statin   # Cigarette smoking -Cessation education at bedside -Nicotine  patch  # Vitamin D  deficiency: started vitamin D  50,000 units p.o. weekly, follow with PCP to repeat vitamin D  level after 3 to 6 months.  # Vitamin B12 level 260, goal >400.  Started vitamin B12 1000 mcg IM injection daily during hospital stay followed by oral supplement on discharge.  Follow with PCP to repeat B12 level after 3 to 6 months.   Body mass index is 19.79 kg/m.  Interventions:  Diet: Heart healthy diet DVT Prophylaxis: Subcutaneous Lovenox    Advance goals of care discussion: Full code  Family Communication: family was not present at bedside, at the time of interview.  The pt provided permission to discuss medical plan with the family. Opportunity was given to ask question and all questions were answered satisfactorily.   Disposition:  Pt is from Home, admitted with respiratory failure due to COPD, still has respiratory failure, which precludes a safe discharge. Discharge to home with [facility] as per PT/OT eval, when stable, may need few days to improve.   Subjective: No significant events overnight except patient was dosed after after having Tussionex with codeine, she does not want this anymore.  Breathing is getting better, no chest pain or palpitation, no any other active issues.   Physical Exam: General: NAD, lying comfortably Appear in no distress, affect appropriate Eyes: PERRLA ENT: Oral Mucosa Clear, moist  Neck: no JVD,  Cardiovascular: S1 and S2 Present, no Murmur,  Respiratory: Equal air entry bilaterally, bilateral crackles and wheezing Abdomen: Bowel  Sound present, Soft and no tenderness,  Skin: no rashes Extremities: no Pedal edema, no calf tenderness Neurologic: without any new focal findings Gait not checked due to patient safety  concerns  Vitals:   10/16/23 0743 10/16/23 0803 10/16/23 1222 10/16/23 1334  BP:  136/71 (!) 140/76   Pulse:  84 72   Resp:  16 20   Temp:  97.7 F (36.5 C) 97.8 F (36.6 C)   TempSrc:   Oral   SpO2: 97% 100% 99% 99%  Weight:      Height:        Intake/Output Summary (Last 24 hours) at 10/16/2023 1509 Last data filed at 10/16/2023 9171 Gross per 24 hour  Intake 240 ml  Output --  Net 240 ml   Filed Weights   10/13/23 0852 10/15/23 1700 10/16/23 0417  Weight: 47.9 kg 52.4 kg 52.3 kg    Data Reviewed: I have personally reviewed and interpreted daily labs, tele strips, imagings as discussed above. I reviewed all nursing notes, pharmacy notes, vitals, pertinent old records I have discussed plan of care as described above with RN and patient/family.  CBC: Recent Labs  Lab 10/13/23 0850 10/14/23 0129 10/15/23 0329 10/16/23 0557  WBC 5.3 3.8* 11.1* 7.4  NEUTROABS 3.3  --   --   --   HGB 12.3 11.4* 11.2* 10.6*  HCT 34.4* 32.7* 33.0* 29.7*  MCV 92.0 94.8 95.9 91.1  PLT 321 301 312 312   Basic Metabolic Panel: Recent Labs  Lab 10/13/23 0850 10/13/23 1933 10/14/23 0129 10/15/23 0329 10/16/23 0557  NA 123* 126* 127* 123* 121*  K 4.5  --  4.4 4.5 4.6  CL 89*  --  96* 88* 87*  CO2 22  --  24 24 27   GLUCOSE 122*  --  134* 120* 152*  BUN <5*  --  6* 10 9  CREATININE 0.49  --  0.54 0.60 0.57  CALCIUM  8.6*  --  8.8* 8.5* 8.4*  MG  --   --  1.8 1.9 1.9  PHOS  --   --  4.0 3.8 3.5    Studies: No results found.  Scheduled Meds:  citalopram   40 mg Oral Daily   cyanocobalamin   1,000 mcg Intramuscular Daily   Followed by   NOREEN ON 10/23/2023] vitamin B-12  1,000 mcg Oral Daily   enoxaparin  (LOVENOX ) injection  40 mg Subcutaneous Q24H   fluticasone  furoate-vilanterol  1 puff Inhalation Daily   And   umeclidinium bromide   1 puff Inhalation Daily   guaiFENesin   600 mg Oral BID   ipratropium-albuterol   3 mL Nebulization Q6H   nicotine   21 mg Transdermal Q24H    predniSONE   40 mg Oral Q breakfast   Followed by   NOREEN ON 10/19/2023] predniSONE   30 mg Oral Q breakfast   Followed by   NOREEN ON 10/22/2023] predniSONE   20 mg Oral Q breakfast   Followed by   NOREEN ON 10/25/2023] predniSONE   10 mg Oral Q breakfast   QUEtiapine   12.5 mg Oral BID   ramipril   10 mg Oral Daily   rosuvastatin   10 mg Oral Daily   Vitamin D  (Ergocalciferol )  50,000 Units Oral Q7 days   Continuous Infusions: PRN Meds: acetaminophen  **OR** acetaminophen , albuterol , chlorpheniramine-HYDROcodone , LORazepam , ondansetron  **OR** ondansetron  (ZOFRAN ) IV  Time spent: 35 minutes  Author: ELVAN SOR. MD [facility] 10/16/2023 3:09 PM  To reach On-call, see care teams to locate the attending and reach out to them via [email or web address].  If 7PM-7AM, please contact night-coverage If you still have difficulty reaching the attending provider, please page the [facility] (Director on Call) for [facility] on amion for assistance.

## 2023-10-16 NOTE — Consult Note (Signed)
 PHARMACY CONSULT NOTE - Tolvaptan   Pharmacy Consult for Tolvaptan  Monitoring  Recent Labs: Potassium (mmol/L)  Date Value  10/16/2023 4.6   Magnesium  (mg/dL)  Date Value  98/86/7974 1.9   Calcium  (mg/dL)  Date Value  98/86/7974 8.4 (L)   Albumin (g/dL)  Date Value  98/86/7974 3.1 (L)  01/09/2023 4.2   Phosphorus (mg/dL)  Date Value  98/86/7974 3.5   Sodium (mmol/L)  Date Value  10/16/2023 124 (L)  01/09/2023 131 (L)   Assessment  Christine Sparks is a 63 y.o. female presenting with a COPD exacerbation. PMH significant for COPD, HTN, HLD, cigarette smoking, & anxiety/depression. Patient found to have serum Na of 123 on admission. Serum Na of 121 today. Pharmacy has been consulted to monitor tolvaptan .  Drug interactions: ramipril , rosuvastatin    Goal of Therapy:  Na > 130 Do not exceed increase in Na by 8 mEq/L per 8 hours or 12 mEq/L in 24 hours  Date/Time Sodium Level Comments  1/13 0656 121  Baseline  Tolvaptan  15 mg given @ 1229  1/13 1808 124 Na increased 3 mEq/L in 6 hours    Plan:  Na level increased 3 mEq/L in 6 hours - no intervention recommended at this time Check Na Q8H x 2 then daily thereafter Continue to monitor for signs of clinical improvement and recommendations per nephrology  Damien Napoleon, PharmD Pharmacy Resident  10/16/2023 6:48 PM

## 2023-10-16 NOTE — Plan of Care (Signed)
  Problem: Education: Goal: Knowledge of General Education information will improve Description: Including pain rating scale, medication(s)/side effects and non-pharmacologic comfort measures Outcome: Progressing   Problem: Clinical Measurements: Goal: Ability to maintain clinical measurements within normal limits will improve Outcome: Progressing   Problem: Activity: Goal: Risk for activity intolerance will decrease Outcome: Progressing   Problem: Respiratory: Goal: Ability to maintain a clear airway will improve Outcome: Progressing   Problem: Respiratory: Goal: Levels of oxygenation will improve Outcome: Progressing   Problem: Respiratory: Goal: Ability to maintain adequate ventilation will improve Outcome: Progressing

## 2023-10-16 NOTE — Evaluation (Signed)
 Occupational Therapy Evaluation Patient Details Name: Christine Sparks MRN: [id number] DOB: 12/23/1960 Today's Date: 10/16/2023   History of Present Illness Pt is a 63 year old female admitted with COPD exacerbation, hyponatremia     PMH significant for COPD, HTN and ongoing tobacco abuse   Clinical Impression   Chart reviewed, pt greeted in room, alert and oriented x4, reporting feeling anxious. Pt is agreeable to OT evaluation. PTA pt is MODI-I in ADL/IADL, uses some energy conservation techniques throughout her day per her report. Pt presents with deficits in activity tolerance affecting safe and optimal ADL completion. ADLs and functional mobility overall performed with supervision-MOD I, use of RW (which is not current baseline but pt requested for comfort). Pt provided hand out re: additional energy conservation techniques. Pt will benefit from acute OT to address deficits and to facilitate optimal ADL performance. OT will follow acutely.   While pt amb in hallway: Spo2 on RA at rest: 90% Spo2 amb on RA: 86% Spo2 amb on 1L via [place]: 91% Spo2 at rest on 1L 94%   HR up to 114 bpm with mobility    If plan is discharge home, recommend the following: Assistance with cooking/housework    Functional Status Assessment  Patient has had a recent decline in their functional status and demonstrates the ability to make significant improvements in function in a reasonable and predictable amount of time.  Equipment Recommendations  None recommended by OT    Recommendations for Other Services       Precautions / Restrictions Precautions Precautions: None      Mobility Bed Mobility Overal bed mobility: Modified Independent                  Transfers Overall transfer level: Modified independent     Sit to Stand: Modified independent (Device/Increase time)                  Balance Overall balance assessment: Needs assistance Sitting-balance support: Feet  supported Sitting balance-Leahy Scale: Normal     Standing balance support: Bilateral upper extremity supported Standing balance-Leahy Scale: Good                             ADL either performed or assessed with clinical judgement   ADL Overall ADL's : Needs assistance/impaired Eating/Feeding: Modified independent   Grooming: Modified independent Grooming Details (indicate cue type and reason): anticipate         Upper Body Dressing : Modified independent   Lower Body Dressing: Modified independent   Toilet Transfer: Supervision/safety;Rolling walker (2 wheels) Toilet Transfer Details (indicate cue type and reason): pt requests to use RW for comfort         Functional mobility during ADLs: Modified independent;Supervision/safety;Rolling walker (2 wheels) (approx 160', pt requests to use RW for comfort)       Vision Patient Visual Report: No change from baseline       Perception         Praxis         Pertinent Vitals/Pain Pain Assessment Pain Assessment: No/denies pain     Extremity/Trunk Assessment Upper Extremity Assessment Upper Extremity Assessment: Overall WFL for tasks assessed   Lower Extremity Assessment Lower Extremity Assessment: Overall WFL for tasks assessed   Cervical / Trunk Assessment Cervical / Trunk Assessment: Normal   Communication Communication Communication: No apparent difficulties Cueing Techniques: Verbal cues;Visual cues   Cognition Arousal: Alert Behavior During Therapy:  Anxious, WFL for tasks assessed/performed Overall Cognitive Status: Within Functional Limits for tasks assessed                                 General Comments: pt reports feeling anxious regarding hospitalization     General Comments       Exercises Other Exercises Other Exercises: edu re: role of OT, role of rehab, energy conservation techniques   Shoulder Instructions      Home Living Family/patient expects to be  discharged to:: Private residence Living Arrangements: Alone Available Help at Discharge: Other (Comment);Family;Available PRN/intermittently Type of Home: House Home Access: Stairs to enter [employer] of Steps: 4 Entrance Stairs-Rails: Left Home Layout: One level     Bathroom Shower/Tub: Tub/shower unit         Home Equipment: Shower seat;Cane - single Librarian, Academic (2 wheels)          Prior Functioning/Environment Prior Level of Function : Independent/Modified Independent;Driving                        OT Problem List: Decreased activity tolerance;Decreased knowledge of use of DME or AE;Cardiopulmonary status limiting activity      OT Treatment/Interventions: Self-care/ADL training;DME and/or AE instruction;Therapeutic activities;Therapeutic exercise;Energy conservation;Patient/family education    OT Goals(Current goals can be found in the care plan section) Acute Rehab OT Goals Patient Stated Goal: go home OT Goal Formulation: With patient Time For Goal Achievement: 10/30/23 Potential to Achieve Goals: Good ADL Goals Pt Will Perform Lower Body Dressing: with modified independence Additional ADL Goal #1: Pt will report 3 energy conservation techniques in order to optimize ADL/IADL performance  OT Frequency: Min 1X/week    Co-evaluation              AM-PAC OT 6 Clicks Daily Activity     Outcome Measure Help from another person eating meals?: None Help from another person taking care of personal grooming?: None Help from another person toileting, which includes using toliet, bedpan, or urinal?: None Help from another person bathing (including washing, rinsing, drying)?: A Little Help from another person to put on and taking off regular upper body clothing?: None Help from another person to put on and taking off regular lower body clothing?: None 6 Click Score: 23   End of Session Equipment Utilized During Treatment: Rolling walker  (2 wheels);Oxygen Nurse Communication: Mobility status;Other (comment) (vitals)  Activity Tolerance: Patient tolerated treatment well Patient left: in bed;with call bell/phone within reach  OT Visit Diagnosis: Other abnormalities of gait and mobility (R26.89)                Time: 8994-8971 OT Time Calculation (min): 23 min Charges:  OT General Charges $OT Visit: 1 Visit OT Evaluation $OT Eval Low Complexity: 1 Low  Glady Ouderkirk, OTD OTR/L  10/16/23, 10:42 AM

## 2023-10-16 NOTE — Consult Note (Signed)
 PHARMACY CONSULT NOTE - Tolvaptan   Pharmacy Consult for Tolvaptan  Monitoring  Recent Labs: Potassium (mmol/L)  Date Value  10/16/2023 4.6   Magnesium  (mg/dL)  Date Value  98/86/7974 1.9   Calcium  (mg/dL)  Date Value  98/86/7974 8.4 (L)   Albumin (g/dL)  Date Value  98/89/7974 3.5  01/09/2023 4.2   Phosphorus (mg/dL)  Date Value  98/86/7974 3.5   Sodium (mmol/L)  Date Value  10/16/2023 121 (L)  01/09/2023 131 (L)   Assessment  Christine Sparks is a 63 y.o. female presenting with a COPD exacerbation. PMH significant for COPD, HTN, HLD, cigarette smoking, & anxiety/depression. Patient found to have serum Na of 123 on admission. Serum Na of 121 today. Pharmacy has been consulted to monitor tolvaptan .  Drug interactions: ramipril , rosuvastatin    Goal of Therapy:  Na > 130 Do not exceed increase in Na by 8 mEq/L per 8 hours or 12 mEq/L in 24 hours  Plan:  Give tolvaptan  15 mg x 1 Check Na Q8H x 2 then daily thereafter Continue to monitor for signs of clinical improvement and recommendations per nephrology  Evonnie Nieves, PharmD Pharmacy Resident  10/16/2023 10:48 AM

## 2023-10-16 NOTE — Consult Note (Signed)
 [facility]  CONSULT NOTE    Date: 10/16/2023                  Patient Name:  Christine Sparks  MRN: [id number]  DOB: 1961/02/06  Age / Sex: 63 y.o., female         PCP: Fernand Fredy RAMAN, MD                 Service Requesting Consult: [facility]                 Reason for Consult: Hyponatremia            History of Present Illness: Christine Sparks is a 63 y.o.  female with past medical conditions including hypertension, hyperlipidemia, COPD, tobacco abuse, and anxiety and depression, who was admitted to [facility] on 10/13/2023 for COPD (chronic obstructive pulmonary disease) (HCC) [J44.9] COPD exacerbation (HCC) [J44.1]  Patient presents to the emergency department with complaints of cough, wheezing, and shortness of breath.  States symptoms began a couple days ago.  Also states she began drinking more water to stay hydrated.  Sodium on ED arrival 123.  Has decreased today to 121.  Full respiratory panel negative.  Chest x-ray negative.  UA is negative.    Medications: Outpatient medications: Medications Prior to Admission  Medication Sig Dispense Refill Last Dose/Taking   citalopram  (CELEXA ) 40 MG tablet Take 1 tablet (40 mg total) by mouth daily. 90 tablet 1 10/12/2023   ipratropium-albuterol  (DUONEB) 0.5-2.5 (3) MG/3ML SOLN Take 3 mLs by nebulization every 6 (six) hours as needed. 360 mL 3 10/13/2023   nicotine  (NICODERM CQ  - DOSED IN MG/24 HOURS) 21 mg/24hr patch Place 1 patch (21 mg total) onto the skin daily. 30 patch 1 10/13/2023   ramipril  (ALTACE ) 10 MG capsule TAKE 1 CAPSULE BY MOUTH ONCE DAILY 30 capsule 1 10/12/2023   rosuvastatin  (CRESTOR ) 10 MG tablet Take 1 tablet (10 mg total) by mouth daily. 90 tablet 1 10/12/2023   TRELEGY ELLIPTA  100-62.5-25 MCG/ACT AEPB Inhale 1 puff into the lungs daily. 60 each 2 10/12/2023   albuterol  (VENTOLIN  HFA) 108 (90 Base) MCG/ACT inhaler Inhale 2 puffs into the lungs every 6 (six) hours as needed for shortness of breath. (Patient not  taking: Reported on 10/13/2023)   Not Taking   busPIRone  (BUSPAR ) 15 MG tablet Take 15 mg by mouth 3 (three) times daily. (Patient not taking: Reported on 10/13/2023)   Not Taking   LORazepam  (ATIVAN ) 1 MG tablet Take 1 tablet (1 mg total) by mouth 3 (three) times daily as needed for anxiety. (Patient not taking: Reported on 10/13/2023) 12 tablet 0 Not Taking    Current medications: Current Facility-Administered Medications  Medication Dose Route Frequency Provider Last Rate Last Admin   acetaminophen  (TYLENOL ) tablet 650 mg  650 mg Oral Q6H PRN Laurita Manor T, MD   650 mg at 10/15/23 2138   Or   acetaminophen  (TYLENOL ) suppository 650 mg  650 mg Rectal Q6H PRN Laurita Manor T, MD       albuterol  (PROVENTIL ) (2.5 MG/3ML) 0.083% nebulizer solution 2.5 mg  2.5 mg Inhalation Q6H PRN Laurita Manor T, MD   2.5 mg at 10/13/23 1314   chlorpheniramine-HYDROcodone  (TUSSIONEX) 10-8 MG/5ML suspension 5 mL  5 mL Oral Q12H PRN Von Bellis, MD   5 mL at 10/15/23 2138   citalopram  (CELEXA ) tablet 40 mg  40 mg Oral Daily Laurita Manor T, MD   40 mg at 10/16/23  1032   cyanocobalamin  (VITAMIN B12) injection 1,000 mcg  1,000 mcg Intramuscular Daily Von Bellis, MD   1,000 mcg at 10/16/23 1229   Followed by   NOREEN ON 10/23/2023] cyanocobalamin  (VITAMIN B12) tablet 1,000 mcg  1,000 mcg Oral Daily Von Bellis, MD       enoxaparin  (LOVENOX ) injection 40 mg  40 mg Subcutaneous Q24H Laurita Manor T, MD   40 mg at 10/15/23 2137   fluticasone  furoate-vilanterol (BREO ELLIPTA ) 100-25 MCG/ACT 1 puff  1 puff Inhalation Daily Laurita Manor T, MD   1 puff at 10/15/23 0747   And   umeclidinium bromide  (INCRUSE ELLIPTA ) 62.5 MCG/ACT 1 puff  1 puff Inhalation Daily Laurita Manor T, MD   1 puff at 10/16/23 1228   guaiFENesin  (MUCINEX ) 12 hr tablet 600 mg  600 mg Oral BID Von Bellis, MD   600 mg at 10/16/23 1032   ipratropium-albuterol  (DUONEB) 0.5-2.5 (3) MG/3ML nebulizer solution 3 mL  3 mL Nebulization Q6H Laurita Manor T, MD   3 mL at  10/16/23 1334   LORazepam  (ATIVAN ) tablet 1 mg  1 mg Oral TID PRN Laurita Manor T, MD   1 mg at 10/16/23 1032   nicotine  (NICODERM CQ  - dosed in mg/24 hours) patch 21 mg  21 mg Transdermal Q24H Laurita Manor T, MD   21 mg at 10/16/23 1035   ondansetron  (ZOFRAN ) tablet 4 mg  4 mg Oral Q6H PRN Laurita Manor DASEN, MD       Or   ondansetron  (ZOFRAN ) injection 4 mg  4 mg Intravenous Q6H PRN Laurita Manor DASEN, MD       predniSONE  (DELTASONE ) tablet 40 mg  40 mg Oral Q breakfast Von Bellis, MD   40 mg at 10/16/23 1032   Followed by   NOREEN ON 10/19/2023] predniSONE  (DELTASONE ) tablet 30 mg  30 mg Oral Q breakfast Von Bellis, MD       Followed by   NOREEN ON 10/22/2023] predniSONE  (DELTASONE ) tablet 20 mg  20 mg Oral Q breakfast Von Bellis, MD       Followed by   NOREEN ON 10/25/2023] predniSONE  (DELTASONE ) tablet 10 mg  10 mg Oral Q breakfast Von Bellis, MD       QUEtiapine  (SEROQUEL ) tablet 12.5 mg  12.5 mg Oral BID Laurita Manor T, MD   12.5 mg at 10/16/23 1032   ramipril  (ALTACE ) capsule 10 mg  10 mg Oral Daily Zhang, Ping T, MD   10 mg at 10/16/23 1033   rosuvastatin  (CRESTOR ) tablet 10 mg  10 mg Oral Daily Laurita Manor T, MD   10 mg at 10/16/23 1032   Vitamin D  (Ergocalciferol ) (DRISDOL ) 1.25 MG (50000 UNIT) capsule 50,000 Units  50,000 Units Oral Q7 days Von Bellis, MD   50,000 Units at 10/16/23 1229      Allergies: Allergies  Allergen Reactions   Zithromax  [Azithromycin ] Nausea And Vomiting   Buspar  [Buspirone ] Other (See Comments)    Made her feel crazy      Past Medical History: Past Medical History:  Diagnosis Date   Anemia    Anxiety    Arthritis    Bronchitis, chronic obstructive (HCC)    Depression    Diabetes mellitus jan 2012   a1c 5.7, fasting glu 155   Dyspnea    with exertion   H/O bronchitis    Heart murmur    High cholesterol    Hypertension    Menopausal syndrome    Tobacco abuse  Past Surgical History: Past Surgical History:  Procedure Laterality  Date   BREAST BIOPSY Left 07/26/2016   complex sclerosing lesion. Coil shape marker   BREAST LUMPECTOMY Left 08/17/2016   complex sclerosing lesion with focal ADH. Clear margins. The coil shaped biopsy marker remains    BREAST LUMPECTOMY WITH NEEDLE LOCALIZATION Left 08/17/2016   Procedure: BREAST LUMPECTOMY WITH NEEDLE LOCALIZATION;  Surgeon: Louanne KANDICE Muse, MD;  Location: [facility] ORS;  Service: General;  Laterality: Left;   CERVICAL CONE BIOPSY     DILATION AND CURETTAGE OF UTERUS     EXCISION / BIOPSY BREAST / NIPPLE / DUCT Left 08/17/2016   complex sclerosing lesion removed   LIPOMA EXCISION Right    benign tumor right leg   LYMPH NODE BIOPSY     TONSILLECTOMY       Family History: Family History  Problem Relation Age of Onset   Cancer Mother        melanoma   Cancer Father        Lung Cancer   Early death Brother    Heart disease Brother    Breast cancer Paternal Aunt 71   Colon cancer Other    Ovarian cancer Neg Hx    Diabetes Neg Hx      Social History: Social History   Socioeconomic History   Marital status: Divorced    Spouse name: Not on file   Number of children: Not on file   Years of education: Not on file   Highest education level: Not on file  Occupational History   Not on file  Tobacco Use   Smoking status: Every Day    Current packs/day: 0.50    Types: Cigarettes   Smokeless tobacco: Never  Substance and Sexual Activity   Alcohol use: Yes    Comment: occasionally   Drug use: No   Sexual activity: Not Currently    Birth control/protection: Post-menopausal  Other Topics Concern   Not on file  Social History Narrative   Not on file   Social Drivers of Health   Financial Resource Strain: Not on file  Food Insecurity: No Food Insecurity (10/15/2023)   Hunger Vital Sign    Worried About Running Out of Food in the Last Year: Never true    Ran Out of Food in the Last Year: Never true  Transportation Needs: No Transportation Needs  (10/15/2023)   PRAPARE - Administrator, Civil Service (Medical): No    Lack of Transportation (Non-Medical): No  Physical Activity: Not on file  Stress: Not on file  Social Connections: Not on file  Intimate Partner Violence: Not At Risk (10/15/2023)   Humiliation, Afraid, Rape, and Kick questionnaire    Fear of Current or Ex-Partner: No    Emotionally Abused: No    Physically Abused: No    Sexually Abused: No     Review of Systems: Review of Systems  Constitutional:  Negative for chills, fever and malaise/fatigue.  HENT:  Negative for congestion, sore throat and tinnitus.   Eyes:  Negative for blurred vision and redness.  Respiratory:  Positive for cough, shortness of breath and wheezing.   Cardiovascular:  Negative for chest pain, palpitations, claudication and leg swelling.  Gastrointestinal:  Negative for abdominal pain, blood in stool, diarrhea, nausea and vomiting.  Genitourinary:  Negative for flank pain, frequency and hematuria.  Musculoskeletal:  Negative for back pain, falls and myalgias.  Skin:  Negative for rash.  Neurological:  Negative for dizziness,  weakness and headaches.  Endo/Heme/Allergies:  Does not bruise/bleed easily.  Psychiatric/Behavioral:  Negative for depression. The patient is not nervous/anxious and does not have insomnia.     Vital Signs: Blood pressure (!) 140/76, pulse 72, temperature 97.8 F (36.6 C), temperature source Oral, resp. rate 20, height 5' 4 (1.626 m), weight 52.3 kg, SpO2 99%.  Weight trends: Filed Weights   10/13/23 0852 10/15/23 1700 10/16/23 0417  Weight: 47.9 kg 52.4 kg 52.3 kg    Physical Exam: General: NAD  Head: Normocephalic, atraumatic. Moist oral mucosal membranes  Eyes: Anicteric  Lungs:  Clear to auscultation, normal effort, NCO 2  Heart: Regular rate and rhythm  Abdomen:  Soft, nontender  Extremities: No peripheral edema.  Neurologic: Nonfocal, moving all four extremities  Skin: No lesions         Lab results: Basic Metabolic Panel: Recent Labs  Lab 10/14/23 0129 10/15/23 0329 10/16/23 0557  NA 127* 123* 121*  K 4.4 4.5 4.6  CL 96* 88* 87*  CO2 24 24 27   GLUCOSE 134* 120* 152*  BUN 6* 10 9  CREATININE 0.54 0.60 0.57  CALCIUM  8.8* 8.5* 8.4*  MG 1.8 1.9 1.9  PHOS 4.0 3.8 3.5    Liver Function Tests: Recent Labs  Lab 10/13/23 0850 10/16/23 0557  AST 30 31  ALT 25 26  ALKPHOS 81 50  BILITOT 0.6 0.5  PROT 5.8* 5.0*  ALBUMIN 3.5 3.1*   Recent Labs  Lab 10/13/23 0850  LIPASE 28   No results for input(s): AMMONIA in the last 168 hours.  CBC: Recent Labs  Lab 10/13/23 0850 10/14/23 0129 10/15/23 0329 10/16/23 0557  WBC 5.3 3.8* 11.1* 7.4  NEUTROABS 3.3  --   --   --   HGB 12.3 11.4* 11.2* 10.6*  HCT 34.4* 32.7* 33.0* 29.7*  MCV 92.0 94.8 95.9 91.1  PLT 321 301 312 312    Cardiac Enzymes: No results for input(s): CKTOTAL, CKMB, CKMBINDEX, TROPONINI in the last 168 hours.  BNP: Invalid input(s): POCBNP  CBG: No results for input(s): GLUCAP in the last 168 hours.  Microbiology: Results for orders placed or performed during the hospital encounter of 10/13/23  Resp panel by RT-PCR (RSV, Flu A&B, Covid) Anterior Nasal Swab     Status: None   Collection Time: 10/13/23  8:50 AM   Specimen: Anterior Nasal Swab  Result Value Ref Range Status   SARS Coronavirus 2 by RT PCR NEGATIVE NEGATIVE Final    Comment: (NOTE) SARS-CoV-2 target nucleic acids are NOT DETECTED.  The SARS-CoV-2 RNA is generally detectable in upper respiratory specimens during the acute phase of infection. The lowest concentration of SARS-CoV-2 viral copies this assay can detect is 138 copies/mL. A negative result does not preclude SARS-Cov-2 infection and should not be used as the sole basis for treatment or other patient management decisions. A negative result may occur with  improper specimen collection/handling, submission of specimen other than  nasopharyngeal swab, presence of viral mutation(s) within the areas targeted by this assay, and inadequate number of viral copies(<138 copies/mL). A negative result must be combined with clinical observations, patient history, and epidemiological information. The expected result is Negative.  Fact Sheet for Patients:  bloggercourse.com  Fact Sheet for Healthcare Providers:  seriousbroker.it  This test is no t yet approved or cleared by the [place]  FDA and  has been authorized for detection and/or diagnosis of SARS-CoV-2 by FDA under an Emergency Use Authorization (EUA). This EUA will remain  in  effect (meaning this test can be used) for the duration of the COVID-19 declaration under Section 564(b)(1) of the Act, 21 U.S.C.section 360bbb-3(b)(1), unless the authorization is terminated  or revoked sooner.       Influenza A by PCR NEGATIVE NEGATIVE Final   Influenza B by PCR NEGATIVE NEGATIVE Final    Comment: (NOTE) The Xpert Xpress SARS-CoV-2/FLU/RSV plus assay is intended as an aid in the diagnosis of influenza from Nasopharyngeal swab specimens and should not be used as a sole basis for treatment. Nasal washings and aspirates are unacceptable for Xpert Xpress SARS-CoV-2/FLU/RSV testing.  Fact Sheet for Patients: bloggercourse.com  Fact Sheet for Healthcare Providers: seriousbroker.it  This test is not yet approved or cleared by the [place]  FDA and has been authorized for detection and/or diagnosis of SARS-CoV-2 by FDA under an Emergency Use Authorization (EUA). This EUA will remain in effect (meaning this test can be used) for the duration of the COVID-19 declaration under Section 564(b)(1) of the Act, 21 U.S.C. section 360bbb-3(b)(1), unless the authorization is terminated or revoked.     Resp Syncytial Virus by PCR NEGATIVE NEGATIVE Final    Comment:  (NOTE) Fact Sheet for Patients: bloggercourse.com  Fact Sheet for Healthcare Providers: seriousbroker.it  This test is not yet approved or cleared by the [place]  FDA and has been authorized for detection and/or diagnosis of SARS-CoV-2 by FDA under an Emergency Use Authorization (EUA). This EUA will remain in effect (meaning this test can be used) for the duration of the COVID-19 declaration under Section 564(b)(1) of the Act, 21 U.S.C. section 360bbb-3(b)(1), unless the authorization is terminated or revoked.  Performed at [facility], [address] [place], [place] [address]   Respiratory (~20 pathogens) panel by PCR     Status: None   Collection Time: 10/14/23  1:29 AM   Specimen: Nasopharyngeal Swab; Respiratory  Result Value Ref Range Status   Adenovirus NOT DETECTED NOT DETECTED Final   Coronavirus 229E NOT DETECTED NOT DETECTED Final    Comment: (NOTE) The Coronavirus on the Respiratory Panel, DOES NOT test for the novel  Coronavirus (2019 nCoV)    Coronavirus HKU1 NOT DETECTED NOT DETECTED Final   Coronavirus NL63 NOT DETECTED NOT DETECTED Final   Coronavirus OC43 NOT DETECTED NOT DETECTED Final   Metapneumovirus NOT DETECTED NOT DETECTED Final   Rhinovirus / Enterovirus NOT DETECTED NOT DETECTED Final   Influenza A NOT DETECTED NOT DETECTED Final   Influenza B NOT DETECTED NOT DETECTED Final   Parainfluenza Virus 1 NOT DETECTED NOT DETECTED Final   Parainfluenza Virus 2 NOT DETECTED NOT DETECTED Final   Parainfluenza Virus 3 NOT DETECTED NOT DETECTED Final   Parainfluenza Virus 4 NOT DETECTED NOT DETECTED Final   Respiratory Syncytial Virus NOT DETECTED NOT DETECTED Final   Bordetella pertussis NOT DETECTED NOT DETECTED Final   Bordetella Parapertussis NOT DETECTED NOT DETECTED Final   Chlamydophila pneumoniae NOT DETECTED NOT DETECTED Final   Mycoplasma pneumoniae NOT DETECTED NOT DETECTED Final     Comment: Performed at [facility], [address], [place], [place] [address]    Coagulation Studies: No results for input(s): LABPROT, INR in the last 72 hours.  Urinalysis: No results for input(s): COLORURINE, LABSPEC, PHURINE, GLUCOSEU, HGBUR, BILIRUBINUR, KETONESUR, PROTEINUR, UROBILINOGEN, NITRITE, LEUKOCYTESUR in the last 72 hours.  Invalid input(s): APPERANCEUR    Imaging: No results found.   Assessment & Plan: Ms. Christine Sparks is a 63 y.o.  female with past medical  conditions including hypertension, hyperlipidemia, COPD, tobacco abuse, and anxiety and depression, who was admitted to [facility] on 10/13/2023 for COPD (chronic obstructive pulmonary disease) (HCC) [J44.9] COPD exacerbation (HCC) [J44.1]  Hyponatremia likely secondary to increased free water intake.  Sodium 123 on admission, now 121.  CT chest ordered to evaluate malignancy.  Will order tolvaptan  15 mg once today and evaluate labs in AM.    LOS: 3 Suri Tafolla 1/13/20253:02 PM

## 2023-10-16 NOTE — TOC Progression Note (Signed)
 Transition of Care [facility]) - Progression Note    Patient Details  Name: Christine Sparks MRN: [id number] Date of Birth: 06-Feb-1961  Transition of Care [facility] CM/SW Contact  Tomasa JAYSON Childes, RN Phone Number: 10/16/2023, 1:27 PM  Clinical Narrative:    Spoke with patient regarding HH  recommendation per therapy. Patient is agreeable to [facility] PT/OT and does not have a choice of an agency. Patient advised the accepting agency will contact her directly to scheduled SOC within 48 post discharge. Patient advised she qualified for home oxygen.   Referral sent to [facility] from [facility].  Request for home oxygen sent to [facility] from [employer].           Expected Discharge Plan and Services                                               Social Determinants of Health (SDOH) Interventions SDOH Screenings   Food Insecurity: No Food Insecurity (10/15/2023)  Housing: Low Risk  (10/15/2023)  Transportation Needs: No Transportation Needs (10/15/2023)  Utilities: Not At Risk (10/15/2023)  Tobacco Use: High Risk (10/13/2023)    Readmission Risk Interventions     No data to display

## 2023-10-17 DIAGNOSIS — J441 Chronic obstructive pulmonary disease with (acute) exacerbation: Secondary | ICD-10-CM | POA: Diagnosis not present

## 2023-10-17 LAB — BASIC METABOLIC PANEL
Anion gap: 8 (ref 5–15)
BUN: 9 mg/dL (ref 8–23)
CO2: 30 mmol/L (ref 22–32)
Calcium: 8.5 mg/dL — ABNORMAL LOW (ref 8.9–10.3)
Chloride: 93 mmol/L — ABNORMAL LOW (ref 98–111)
Creatinine, Ser: 0.64 mg/dL (ref 0.44–1.00)
GFR, Estimated: >60 mL/min (ref >60)
Glucose, Bld: 97 mg/dL (ref 70–99)
Potassium: 3.7 mmol/L (ref 3.5–5.1)
Sodium: 131 mmol/L — ABNORMAL LOW (ref 135–145)

## 2023-10-17 LAB — CBC
HCT: 30.8 % — ABNORMAL LOW (ref 36.0–46.0)
Hemoglobin: 10.9 g/dL — ABNORMAL LOW (ref 12.0–15.0)
MCH: 32.8 pg (ref 26.0–34.0)
MCHC: 35.4 g/dL (ref 30.0–36.0)
MCV: 92.8 fL (ref 80.0–100.0)
Platelets: 321 10*3/uL (ref 150–400)
RBC: 3.32 MIL/uL — ABNORMAL LOW (ref 3.87–5.11)
RDW: 12.2 % (ref 11.5–15.5)
WBC: 8.9 10*3/uL (ref 4.0–10.5)
nRBC: 0 % (ref 0.0–0.2)

## 2023-10-17 LAB — MAGNESIUM: Magnesium: 2.1 mg/dL (ref 1.7–2.4)

## 2023-10-17 LAB — PHOSPHORUS: Phosphorus: 3.6 mg/dL (ref 2.5–4.6)

## 2023-10-17 MED ORDER — SODIUM CHLORIDE 1 G PO TABS
1.0000 g | ORAL_TABLET | Freq: Three times a day (TID) | ORAL | Status: DC
  Administered 2023-10-17 – 2023-10-18 (×4): 1 g via ORAL
  Filled 2023-10-17 (×4): qty 1

## 2023-10-17 NOTE — Progress Notes (Signed)
 Physical Therapy Treatment Patient Details Name: Christine Sparks MRN: [id number] DOB: 01-20-1961 Today's Date: 10/17/2023   History of Present Illness Pt is a 63 year old female admitted with COPD exacerbation, hyponatremia     PMH significant for COPD, HTN and ongoing tobacco abuse    PT Comments  Patient is agreeable to PT. She is eager to go home. Patient walked with supervision using rolling walker. Limited endurance with education provided for strategies for energy conservation. PT will continue to follow to maximize independence in preparation for discharge home.    If plan is discharge home, recommend the following: Assist for transportation;Help with stairs or ramp for entrance   Can travel by private vehicle        Equipment Recommendations  None recommended by PT    Recommendations for Other Services       Precautions / Restrictions Precautions Precautions: None Restrictions Weight Bearing Restrictions Per Provider Order: No     Mobility  Bed Mobility Overal bed mobility: Modified Independent                  Transfers Overall transfer level: Modified independent                      Ambulation/Gait Ambulation/Gait assistance: Supervision Gait Distance (Feet): 200 Feet Assistive device: Rolling walker (2 wheels) Gait Pattern/deviations: Step-through pattern Gait velocity: decreased     General Gait Details: one standing break with education on using energy conservation strategies with mobility.   Stairs             Wheelchair Mobility     Tilt Bed    Modified Rankin (Stroke Patients Only)       Balance Overall balance assessment: Needs assistance Sitting-balance support: Feet supported Sitting balance-Leahy Scale: Normal     Standing balance support: Single extremity supported, Bilateral upper extremity supported Standing balance-Leahy Scale: Fair                              Cognition Arousal:  Alert Behavior During Therapy: Anxious, WFL for tasks assessed/performed Overall Cognitive Status: Within Functional Limits for tasks assessed                                          Exercises      General Comments General comments (skin integrity, edema, etc.): Sp02 93% at rest on 1 L02 and 86% after walker. Sp02 increased quickly to 90% with short seated rest break.      Pertinent Vitals/Pain Pain Assessment Pain Assessment: No/denies pain    Home Living                          Prior Function            PT Goals (current goals can now be found in the care plan section) Acute Rehab PT Goals Patient Stated Goal: to go home PT Goal Formulation: With patient Time For Goal Achievement: 10/29/23 Potential to Achieve Goals: Good Progress towards PT goals: Progressing toward goals    Frequency    Min 1X/week      PT Plan      Co-evaluation              AM-PAC PT 6 Clicks Mobility   Outcome Measure  Help needed turning from your back to your side while in a flat bed without using bedrails?: None Help needed moving from lying on your back to sitting on the side of a flat bed without using bedrails?: None Help needed moving to and from a bed to a chair (including a wheelchair)?: A Little Help needed standing up from a chair using your arms (e.g., wheelchair or bedside chair)?: A Little Help needed to walk in hospital room?: A Little Help needed climbing 3-5 steps with a railing? : A Little 6 Click Score: 20    End of Session Equipment Utilized During Treatment: Oxygen Activity Tolerance: Patient tolerated treatment well Patient left: in bed;with call bell/phone within reach   PT Visit Diagnosis: Unsteadiness on feet (R26.81);Muscle weakness (generalized) (M62.81);Other abnormalities of gait and mobility (R26.89)     Time: 8964-8948 PT Time Calculation (min) (ACUTE ONLY): 16 min  Charges:    $Therapeutic Activity: 8-22  mins PT General Charges $$ ACUTE PT VISIT: 1 Visit                     Christine Sparks, PT, MPT    Christine Sparks 10/17/2023, 10:56 AM

## 2023-10-17 NOTE — Progress Notes (Signed)
 [facility] Progress Note  Patient: Christine Sparks    FMW:[id number]  DOA: 10/13/2023     Date of Service: the patient was seen and examined on 10/17/2023  Chief Complaint  Patient presents with   Respiratory Distress   Brief hospital course: Christine Sparks is a 63 y.o. female with medical history significant of COPD, HTN, HLD, cigarette smoking, anxiety/depression, presented with recurrent cough wheezing shortness of breath.  COPD exacerbation.  Patient had similar presentation 3 weeks ago.  She stayed well for 2 weeks and then started having progressive shortness of breath with wheezing and productive cough, getting worse for past 3 days. She called EMS this morning, [facility] arrived at their home and found patient hypoxic patient was given IV Solu-Medrol  and DuoNeb treatment and placed on BiPAP.    ED Course: Afebrile, no tachycardia nonhypotensive continue to be on BiPAP at ED.  Chest x-ray showed no acute infiltrates.  Blood work showed sodium 123, creatinine 3.4, glucose 122, VBG showed 7.38/42/56.  Troponin 8> 66, EKG showed no acute ST changes.   Assessment and Plan: # Acute hypoxic respiratory failure # Acute COPD exacerbation -Continue BiPAP prn -Continue IV Solu-Medrol  40 BID x 2 days, then started tapering prednisone .  -Continue ICS and LABA, DuoNebs and as needed albuterol  -Incentive telemetry Mucinex  600 mg p.o. twice daily,  # Elevated troponins -No chest pains, EKG showed no acute ST changes. -Clinically suspect elevated troponin secondary to demanding ischemia from acute COPD exacerbation. -Echocardiogram was done on last admission, we will not repeat echocardiogram this time Patient remained chest pain-free.   # Hypotonic hyponatremia -Workup on last admission showed hyponatremia secondary to dilutional and excessive water drinking -Continue fluid restriction Monitor sodium level daily Na 121--131  Nephrology consulted, tolvaptan  15 mg one-time dose given 1/14  started sodium chloride  1 g p.o. 3 times daily   # Anxiety/depression -Poorly controlled, start low-dose of p.o. Seroquel  twice daily   # HTN and HLD -Stable, continue home BP meds and statin   # Cigarette smoking -Cessation education at bedside -Nicotine  patch  # Vitamin D  deficiency: started vitamin D  50,000 units p.o. weekly, follow with PCP to repeat vitamin D  level after 3 to 6 months.  # Vitamin B12 level 260, goal >400.  Started vitamin B12 1000 mcg IM injection daily during hospital stay followed by oral supplement on discharge.  Follow with PCP to repeat B12 level after 3 to 6 months.   Body mass index is 19.79 kg/m.  Interventions:  Diet: Heart healthy diet DVT Prophylaxis: Subcutaneous Lovenox    Advance goals of care discussion: Full code  Family Communication: family was not present at bedside, at the time of interview.  The pt provided permission to discuss medical plan with the family. Opportunity was given to ask question and all questions were answered satisfactorily.   Disposition:  Pt is from Home, admitted with respiratory failure due to COPD, still has respiratory failure, which precludes a safe discharge. Discharge to home with [facility] as per PT/OT eval, when stable, most likely tomorrow a.m.    Subjective: No significant events overnight, patient gradually getting better, still using monitor oxygen via nasal cannula. Denies any chest pain or palpitation, no any other active issues.   Physical Exam: General: NAD, lying comfortably Appear in no distress, affect appropriate Eyes: PERRLA ENT: Oral Mucosa Clear, moist  Neck: no JVD,  Cardiovascular: S1 and S2 Present, no Murmur,  Respiratory: Equal air entry bilaterally, bilateral crackles and wheezing Abdomen: Bowel  Sound present, Soft and no tenderness,  Skin: no rashes Extremities: no Pedal edema, no calf tenderness Neurologic: without any new focal findings Gait not checked due to patient safety  concerns  Vitals:   10/16/23 2352 10/17/23 0351 10/17/23 0743 10/17/23 1156  BP: 128/82 (!) 148/93 (!) 155/94 117/76  Pulse: 77 81 82 89  Resp: 20 20 18 18   Temp: 98.2 F (36.8 C) 98.3 F (36.8 C) 98.5 F (36.9 C) 98.7 F (37.1 C)  TempSrc:   Oral Oral  SpO2: 96% 98% 92% 99%  Weight:      Height:        Intake/Output Summary (Last 24 hours) at 10/17/2023 1437 Last data filed at 10/17/2023 1426 Gross per 24 hour  Intake 360 ml  Output 1600 ml  Net -1240 ml   Filed Weights   10/13/23 0852 10/15/23 1700 10/16/23 0417  Weight: 47.9 kg 52.4 kg 52.3 kg    Data Reviewed: I have personally reviewed and interpreted daily labs, tele strips, imagings as discussed above. I reviewed all nursing notes, pharmacy notes, vitals, pertinent old records I have discussed plan of care as described above with RN and patient/family.  CBC: Recent Labs  Lab 10/13/23 0850 10/14/23 0129 10/15/23 0329 10/16/23 0557 10/17/23 0215  WBC 5.3 3.8* 11.1* 7.4 8.9  NEUTROABS 3.3  --   --   --   --   HGB 12.3 11.4* 11.2* 10.6* 10.9*  HCT 34.4* 32.7* 33.0* 29.7* 30.8*  MCV 92.0 94.8 95.9 91.1 92.8  PLT 321 301 312 312 321   Basic Metabolic Panel: Recent Labs  Lab 10/13/23 0850 10/13/23 1933 10/14/23 0129 10/15/23 0329 10/16/23 0557 10/16/23 1808 10/17/23 0215  NA 123*   < > 127* 123* 121* 124* 131*  K 4.5  --  4.4 4.5 4.6  --  3.7  CL 89*  --  96* 88* 87*  --  93*  CO2 22  --  24 24 27   --  30  GLUCOSE 122*  --  134* 120* 152*  --  97  BUN <5*  --  6* 10 9  --  9  CREATININE 0.49  --  0.54 0.60 0.57  --  0.64  CALCIUM  8.6*  --  8.8* 8.5* 8.4*  --  8.5*  MG  --   --  1.8 1.9 1.9  --  2.1  PHOS  --   --  4.0 3.8 3.5  --  3.6   < > = values in this interval not displayed.    Studies: No results found.  Scheduled Meds:  citalopram   40 mg Oral Daily   cyanocobalamin   1,000 mcg Intramuscular Daily   Followed by   NOREEN ON 10/23/2023] vitamin B-12  1,000 mcg Oral Daily   enoxaparin   (LOVENOX ) injection  40 mg Subcutaneous Q24H   fluticasone  furoate-vilanterol  1 puff Inhalation Daily   And   umeclidinium bromide   1 puff Inhalation Daily   guaiFENesin   600 mg Oral BID   ipratropium-albuterol   3 mL Nebulization Q6H   nicotine   21 mg Transdermal Q24H   predniSONE   40 mg Oral Q breakfast   Followed by   NOREEN ON 10/19/2023] predniSONE   30 mg Oral Q breakfast   Followed by   NOREEN ON 10/22/2023] predniSONE   20 mg Oral Q breakfast   Followed by   NOREEN ON 10/25/2023] predniSONE   10 mg Oral Q breakfast   QUEtiapine   12.5 mg Oral BID   ramipril   10 mg Oral Daily   rosuvastatin   10 mg Oral Daily   Vitamin D  (Ergocalciferol )  50,000 Units Oral Q7 days   Continuous Infusions: PRN Meds: acetaminophen  **OR** acetaminophen , albuterol , guaiFENesin -dextromethorphan, LORazepam , ondansetron  **OR** ondansetron  (ZOFRAN ) IV  Time spent: 35 minutes  Author: ELVAN SOR. MD [facility] 10/17/2023 2:37 PM  To reach On-call, see care teams to locate the attending and reach out to them via [email or web address]. If 7PM-7AM, please contact night-coverage If you still have difficulty reaching the attending provider, please page the [facility] (Director on Call) for [facility] on amion for assistance.

## 2023-10-17 NOTE — Progress Notes (Signed)
 [facility]  ROUNDING NOTE   Subjective:   Patient seen sitting up in bed Tolerating meals without nausea or vomiting  Reports she is restricting her fluid intake.   Sodium 131 UOP 1.6L  Objective:  Vital signs in last 24 hours:  Temp:  [97.8 F (36.6 C)-98.5 F (36.9 C)] 98.5 F (36.9 C) (01/14 0743) Pulse Rate:  [72-93] 82 (01/14 0743) Resp:  [18-20] 18 (01/14 0743) BP: (128-155)/(70-94) 155/94 (01/14 0743) SpO2:  [92 %-100 %] 92 % (01/14 0743)  Weight change:  Filed Weights   10/13/23 0852 10/15/23 1700 10/16/23 0417  Weight: 47.9 kg 52.4 kg 52.3 kg    Intake/Output: I/O last 3 completed shifts: In: 600 [P.O.:600] Out: 1600 [Urine:1600]   Intake/Output this shift:  No intake/output data recorded.  Physical Exam: General: NAD  Head: Normocephalic, atraumatic. Moist oral mucosal membranes  Eyes: Anicteric  Lungs:  Clear to auscultation, normal effort  Heart: Regular rate and rhythm  Abdomen:  Soft, nontender  Extremities:  No peripheral edema.  Neurologic: Nonfocal, moving all four extremities  Skin: No lesions       Basic Metabolic Panel: Recent Labs  Lab 10/13/23 0850 10/13/23 1933 10/14/23 0129 10/15/23 0329 10/16/23 0557 10/16/23 1808 10/17/23 0215  NA 123*   < > 127* 123* 121* 124* 131*  K 4.5  --  4.4 4.5 4.6  --  3.7  CL 89*  --  96* 88* 87*  --  93*  CO2 22  --  24 24 27   --  30  GLUCOSE 122*  --  134* 120* 152*  --  97  BUN <5*  --  6* 10 9  --  9  CREATININE 0.49  --  0.54 0.60 0.57  --  0.64  CALCIUM  8.6*  --  8.8* 8.5* 8.4*  --  8.5*  MG  --   --  1.8 1.9 1.9  --  2.1  PHOS  --   --  4.0 3.8 3.5  --  3.6   < > = values in this interval not displayed.    Liver Function Tests: Recent Labs  Lab 10/13/23 0850 10/16/23 0557  AST 30 31  ALT 25 26  ALKPHOS 81 50  BILITOT 0.6 0.5  PROT 5.8* 5.0*  ALBUMIN 3.5 3.1*   Recent Labs  Lab 10/13/23 0850  LIPASE 28   No results for input(s): AMMONIA in the last 168  hours.  CBC: Recent Labs  Lab 10/13/23 0850 10/14/23 0129 10/15/23 0329 10/16/23 0557 10/17/23 0215  WBC 5.3 3.8* 11.1* 7.4 8.9  NEUTROABS 3.3  --   --   --   --   HGB 12.3 11.4* 11.2* 10.6* 10.9*  HCT 34.4* 32.7* 33.0* 29.7* 30.8*  MCV 92.0 94.8 95.9 91.1 92.8  PLT 321 301 312 312 321    Cardiac Enzymes: No results for input(s): CKTOTAL, CKMB, CKMBINDEX, TROPONINI in the last 168 hours.  BNP: Invalid input(s): POCBNP  CBG: No results for input(s): GLUCAP in the last 168 hours.  Microbiology: Results for orders placed or performed during the hospital encounter of 10/13/23  Resp panel by RT-PCR (RSV, Flu A&B, Covid) Anterior Nasal Swab     Status: None   Collection Time: 10/13/23  8:50 AM   Specimen: Anterior Nasal Swab  Result Value Ref Range Status   SARS Coronavirus 2 by RT PCR NEGATIVE NEGATIVE Final    Comment: (NOTE) SARS-CoV-2 target nucleic acids are NOT DETECTED.  The SARS-CoV-2  RNA is generally detectable in upper respiratory specimens during the acute phase of infection. The lowest concentration of SARS-CoV-2 viral copies this assay can detect is 138 copies/mL. A negative result does not preclude SARS-Cov-2 infection and should not be used as the sole basis for treatment or other patient management decisions. A negative result may occur with  improper specimen collection/handling, submission of specimen other than nasopharyngeal swab, presence of viral mutation(s) within the areas targeted by this assay, and inadequate number of viral copies(<138 copies/mL). A negative result must be combined with clinical observations, patient history, and epidemiological information. The expected result is Negative.  Fact Sheet for Patients:  bloggercourse.com  Fact Sheet for Healthcare Providers:  seriousbroker.it  This test is no t yet approved or cleared by the [place]  FDA and  has been  authorized for detection and/or diagnosis of SARS-CoV-2 by FDA under an Emergency Use Authorization (EUA). This EUA will remain  in effect (meaning this test can be used) for the duration of the COVID-19 declaration under Section 564(b)(1) of the Act, 21 U.S.C.section 360bbb-3(b)(1), unless the authorization is terminated  or revoked sooner.       Influenza A by PCR NEGATIVE NEGATIVE Final   Influenza B by PCR NEGATIVE NEGATIVE Final    Comment: (NOTE) The Xpert Xpress SARS-CoV-2/FLU/RSV plus assay is intended as an aid in the diagnosis of influenza from Nasopharyngeal swab specimens and should not be used as a sole basis for treatment. Nasal washings and aspirates are unacceptable for Xpert Xpress SARS-CoV-2/FLU/RSV testing.  Fact Sheet for Patients: bloggercourse.com  Fact Sheet for Healthcare Providers: seriousbroker.it  This test is not yet approved or cleared by the [place]  FDA and has been authorized for detection and/or diagnosis of SARS-CoV-2 by FDA under an Emergency Use Authorization (EUA). This EUA will remain in effect (meaning this test can be used) for the duration of the COVID-19 declaration under Section 564(b)(1) of the Act, 21 U.S.C. section 360bbb-3(b)(1), unless the authorization is terminated or revoked.     Resp Syncytial Virus by PCR NEGATIVE NEGATIVE Final    Comment: (NOTE) Fact Sheet for Patients: bloggercourse.com  Fact Sheet for Healthcare Providers: seriousbroker.it  This test is not yet approved or cleared by the [place]  FDA and has been authorized for detection and/or diagnosis of SARS-CoV-2 by FDA under an Emergency Use Authorization (EUA). This EUA will remain in effect (meaning this test can be used) for the duration of the COVID-19 declaration under Section 564(b)(1) of the Act, 21 U.S.C. section 360bbb-3(b)(1), unless the  authorization is terminated or revoked.  Performed at [facility], [address] [place], [place] [address]   Respiratory (~20 pathogens) panel by PCR     Status: None   Collection Time: 10/14/23  1:29 AM   Specimen: Nasopharyngeal Swab; Respiratory  Result Value Ref Range Status   Adenovirus NOT DETECTED NOT DETECTED Final   Coronavirus 229E NOT DETECTED NOT DETECTED Final    Comment: (NOTE) The Coronavirus on the Respiratory Panel, DOES NOT test for the novel  Coronavirus (2019 nCoV)    Coronavirus HKU1 NOT DETECTED NOT DETECTED Final   Coronavirus NL63 NOT DETECTED NOT DETECTED Final   Coronavirus OC43 NOT DETECTED NOT DETECTED Final   Metapneumovirus NOT DETECTED NOT DETECTED Final   Rhinovirus / Enterovirus NOT DETECTED NOT DETECTED Final   Influenza A NOT DETECTED NOT DETECTED Final   Influenza B NOT DETECTED NOT DETECTED Final   Parainfluenza Virus 1 NOT DETECTED NOT DETECTED  Final   Parainfluenza Virus 2 NOT DETECTED NOT DETECTED Final   Parainfluenza Virus 3 NOT DETECTED NOT DETECTED Final   Parainfluenza Virus 4 NOT DETECTED NOT DETECTED Final   Respiratory Syncytial Virus NOT DETECTED NOT DETECTED Final   Bordetella pertussis NOT DETECTED NOT DETECTED Final   Bordetella Parapertussis NOT DETECTED NOT DETECTED Final   Chlamydophila pneumoniae NOT DETECTED NOT DETECTED Final   Mycoplasma pneumoniae NOT DETECTED NOT DETECTED Final    Comment: Performed at [facility], [address] [place], [place] [address]    Coagulation Studies: No results for input(s): LABPROT, INR in the last 72 hours.  Urinalysis: No results for input(s): COLORURINE, LABSPEC, PHURINE, GLUCOSEU, HGBUR, BILIRUBINUR, KETONESUR, PROTEINUR, UROBILINOGEN, NITRITE, LEUKOCYTESUR in the last 72 hours.  Invalid input(s): APPERANCEUR    Imaging: CT Chest High Resolution Result Date: 10/17/2023 CLINICAL DATA:  63 year old female with history of  hyponatremia. EXAM: CT CHEST WITHOUT CONTRAST TECHNIQUE: Multidetector CT imaging of the chest was performed following the standard protocol without intravenous contrast. High resolution imaging of the lungs, as well as inspiratory and expiratory imaging, was performed. RADIATION DOSE REDUCTION: This exam was performed according to the departmental dose-optimization program which includes automated exposure control, adjustment of the mA and/or kV according to patient size and/or use of iterative reconstruction technique. COMPARISON:  Chest CTA 09/19/2023. FINDINGS: Cardiovascular: Heart size is normal. There is no significant pericardial fluid, thickening or pericardial calcification. There is aortic atherosclerosis, as well as atherosclerosis of the great vessels of the mediastinum and the coronary arteries, including calcified atherosclerotic plaque in the left anterior descending, left circumflex and right coronary arteries. Mediastinum/Nodes: No pathologically enlarged mediastinal or hilar lymph nodes. Please note that accurate exclusion of hilar adenopathy is limited on noncontrast CT scans. Esophagus is unremarkable in appearance. No axillary lymphadenopathy. Lungs/Pleura: High-resolution images demonstrate no significant regions of ground-glass attenuation, septal thickening, subpleural reticulation, parenchymal banding, traction bronchiectasis or honeycombing to indicate interstitial lung disease. Diffuse bronchial wall thickening with moderate centrilobular and paraseptal emphysema. No acute consolidative airspace disease. No pleural effusions. No suspicious appearing pulmonary nodules or masses are noted. Upper Abdomen: Aortic atherosclerosis. Musculoskeletal: There are no aggressive appearing lytic or blastic lesions noted in the visualized portions of the skeleton. IMPRESSION: 1. No evidence of interstitial lung disease. 2. Diffuse bronchial wall thickening with moderate centrilobular and paraseptal  emphysema; imaging findings suggestive of underlying COPD. 3. Aortic atherosclerosis, in addition to three-vessel coronary artery disease. Please note that although the presence of coronary artery calcium  documents the presence of coronary artery disease, the severity of this disease and any potential stenosis cannot be assessed on this non-gated CT examination. Assessment for potential risk factor modification, dietary therapy or pharmacologic therapy may be warranted, if clinically indicated. Aortic Atherosclerosis (ICD10-I70.0) and Emphysema (ICD10-J43.9). Electronically Signed   By: Toribio Aye M.D.   On: 10/17/2023 09:41     Medications:     citalopram   40 mg Oral Daily   cyanocobalamin   1,000 mcg Intramuscular Daily   Followed by   NOREEN ON 10/23/2023] vitamin B-12  1,000 mcg Oral Daily   enoxaparin  (LOVENOX ) injection  40 mg Subcutaneous Q24H   fluticasone  furoate-vilanterol  1 puff Inhalation Daily   And   umeclidinium bromide   1 puff Inhalation Daily   guaiFENesin   600 mg Oral BID   ipratropium-albuterol   3 mL Nebulization Q6H   nicotine   21 mg Transdermal Q24H   predniSONE   40 mg Oral Q breakfast   Followed  by   NOREEN ON 10/19/2023] predniSONE   30 mg Oral Q breakfast   Followed by   NOREEN ON 10/22/2023] predniSONE   20 mg Oral Q breakfast   Followed by   NOREEN ON 10/25/2023] predniSONE   10 mg Oral Q breakfast   QUEtiapine   12.5 mg Oral BID   ramipril   10 mg Oral Daily   rosuvastatin   10 mg Oral Daily   Vitamin D  (Ergocalciferol )  50,000 Units Oral Q7 days   acetaminophen  **OR** acetaminophen , albuterol , guaiFENesin -dextromethorphan, LORazepam , ondansetron  **OR** ondansetron  (ZOFRAN ) IV  Assessment/ Plan:  Ms. SOLE Sparks is a 63 y.o.  female with past medical conditions including hypertension, hyperlipidemia, COPD, tobacco abuse, and anxiety and depression, who was admitted to [facility] on 10/13/2023 for COPD (chronic obstructive pulmonary disease) (HCC) [J44.9] COPD  exacerbation (HCC) [J44.1]   Hyponatremia likely secondary to increased free water intake with an element of SIADH. Sodium 123 on admission. CT chest negative for acute findings. Received Tolvaptan  yesterday, sodium increased to 131 today. Encouraged patient to maintain fluid restriction, 40oz daily, and salt tabs 1g teice daily. Will follow up in our office in 1-2 weeks. She is cleared to discharge from renal stance.      LOS: 4 Christine Sparks 1/14/202511:29 AM

## 2023-10-17 NOTE — Consult Note (Signed)
 PHARMACY CONSULT NOTE - Tolvaptan   Pharmacy Consult for Tolvaptan  Monitoring  Recent Labs: Potassium (mmol/L)  Date Value  10/17/2023 3.7   Magnesium  (mg/dL)  Date Value  98/85/7974 2.1   Calcium  (mg/dL)  Date Value  98/85/7974 8.5 (L)   Albumin (g/dL)  Date Value  98/86/7974 3.1 (L)  01/09/2023 4.2   Phosphorus (mg/dL)  Date Value  98/85/7974 3.6   Sodium (mmol/L)  Date Value  10/17/2023 131 (L)  01/09/2023 131 (L)   Assessment  Christine Sparks is a 63 y.o. female presenting with a COPD exacerbation. PMH significant for COPD, HTN, HLD, cigarette smoking, & anxiety/depression. Patient found to have serum Na of 123 on admission. Serum Na of 121 today. Pharmacy has been consulted to monitor tolvaptan .  Drug interactions: ramipril , rosuvastatin    Goal of Therapy:  Na > 130 Do not exceed increase in Na by 8 mEq/L per 8 hours or 12 mEq/L in 24 hours  Date/Time Sodium Level Comments  1/13 0656 121  Baseline  Tolvaptan  15 mg given @ 1229  1/13 1808 124 Na increased 3 mEq/L in 6 hours  1/14 0215 131 Na increased 7 mEq/L in 8 hrs    Plan:  Na level increased 8 mEq/L in 8 hours - no intervention recommended at this time Check Na Q8H x 2 then daily thereafter Continue to monitor for signs of clinical improvement and recommendations per nephrology  Tahirih Lair D, PharmD 10/17/2023 3:53 AM

## 2023-10-17 NOTE — Plan of Care (Signed)
  Problem: Education: Goal: Knowledge of General Education information will improve Description: Including pain rating scale, medication(s)/side effects and non-pharmacologic comfort measures Outcome: Progressing   Problem: Health Behavior/Discharge Planning: Goal: Ability to manage health-related needs will improve Outcome: Progressing   Problem: Clinical Measurements: Goal: Will remain free from infection Outcome: Progressing Goal: Diagnostic test results will improve Outcome: Progressing Goal: Cardiovascular complication will be avoided Outcome: Progressing   Problem: Activity: Goal: Risk for activity intolerance will decrease Outcome: Progressing   Problem: Nutrition: Goal: Adequate nutrition will be maintained Outcome: Progressing   Problem: Coping: Goal: Level of anxiety will decrease Outcome: Not Progressing

## 2023-10-17 NOTE — Plan of Care (Signed)

## 2023-10-17 NOTE — TOC Progression Note (Signed)
 Transition of Care [facility]) - Progression Note    Patient Details  Name: Christine Sparks MRN: [id number] Date of Birth: 1961-09-06  Transition [facility]) CM/SW Contact  Tomasa JAYSON Childes, RN Phone Number: 10/17/2023, 3:35 PM  Clinical Narrative:    Referral declined by [facility] due to not being in network.   Per [place]  insurance is out of network.  Spoke with patient at bedside regarding home health. Patient advised outpatient therapy may be a option if insurance is out of network with agencies. New home oxygen was sitting in room.   RNCM called Dorothe from [facility]. Plan is accepted but may have  copayments. Referral sent.       Expected Discharge Plan and Services                                               Social Determinants of Health (SDOH) Interventions SDOH Screenings   Food Insecurity: No Food Insecurity (10/15/2023)  Housing: Low Risk  (10/15/2023)  Transportation Needs: No Transportation Needs (10/15/2023)  Utilities: Not At Risk (10/15/2023)  Tobacco Use: High Risk (10/13/2023)    Readmission Risk Interventions     No data to display

## 2023-10-18 DIAGNOSIS — J441 Chronic obstructive pulmonary disease with (acute) exacerbation: Secondary | ICD-10-CM | POA: Diagnosis not present

## 2023-10-18 LAB — CBC
HCT: 31.5 % — ABNORMAL LOW (ref 36.0–46.0)
Hemoglobin: 10.9 g/dL — ABNORMAL LOW (ref 12.0–15.0)
MCH: 33 pg (ref 26.0–34.0)
MCHC: 34.6 g/dL (ref 30.0–36.0)
MCV: 95.5 fL (ref 80.0–100.0)
Platelets: 361 10*3/uL (ref 150–400)
RBC: 3.3 MIL/uL — ABNORMAL LOW (ref 3.87–5.11)
RDW: 12 % (ref 11.5–15.5)
WBC: 8.9 10*3/uL (ref 4.0–10.5)
nRBC: 0 % (ref 0.0–0.2)

## 2023-10-18 LAB — BASIC METABOLIC PANEL
Anion gap: 9 (ref 5–15)
BUN: 10 mg/dL (ref 8–23)
CO2: 29 mmol/L (ref 22–32)
Calcium: 8.4 mg/dL — ABNORMAL LOW (ref 8.9–10.3)
Chloride: 96 mmol/L — ABNORMAL LOW (ref 98–111)
Creatinine, Ser: 0.56 mg/dL (ref 0.44–1.00)
GFR, Estimated: >60 mL/min (ref >60)
Glucose, Bld: 81 mg/dL (ref 70–99)
Potassium: 3.9 mmol/L (ref 3.5–5.1)
Sodium: 134 mmol/L — ABNORMAL LOW (ref 135–145)

## 2023-10-18 MED ORDER — VITAMIN D (ERGOCALCIFEROL) 1.25 MG (50000 UNIT) PO CAPS: 50000.0000 [IU] | ORAL_CAPSULE | ORAL | 0 refills | Status: AC

## 2023-10-18 MED ORDER — BISACODYL 5 MG PO TBEC
10.0000 mg | DELAYED_RELEASE_TABLET | Freq: Once | ORAL | Status: AC
Start: 2023-10-18 — End: 2023-10-18
  Administered 2023-10-18: 10 mg via ORAL
  Filled 2023-10-18: qty 2

## 2023-10-18 MED ORDER — PREDNISONE 10 MG PO TABS: ORAL_TABLET | ORAL | 0 refills | Status: AC

## 2023-10-18 MED ORDER — POLYETHYLENE GLYCOL 3350 17 G PO PACK
17.0000 g | PACK | Freq: Two times a day (BID) | ORAL | Status: DC
  Administered 2023-10-18: 17 g via ORAL
  Filled 2023-10-18: qty 1

## 2023-10-18 MED ORDER — GUAIFENESIN-DM 100-10 MG/5ML PO SYRP: 5.0000 mL | ORAL_SOLUTION | ORAL | 0 refills | Status: DC | PRN

## 2023-10-18 MED ORDER — FAMOTIDINE 40 MG PO TABS: 40.0000 mg | ORAL_TABLET | Freq: Every day | ORAL | 0 refills | Status: DC

## 2023-10-18 MED ORDER — BISACODYL 10 MG RE SUPP: 10.0000 mg | Freq: Every day | RECTAL | Status: DC | PRN

## 2023-10-18 MED ORDER — ALBUTEROL SULFATE HFA 108 (90 BASE) MCG/ACT IN AERS: 2.0000 | INHALATION_SPRAY | Freq: Four times a day (QID) | RESPIRATORY_TRACT | 5 refills | Status: DC | PRN

## 2023-10-18 MED ORDER — ALBUTEROL SULFATE HFA 108 (90 BASE) MCG/ACT IN AERS: 2.0000 | INHALATION_SPRAY | Freq: Four times a day (QID) | RESPIRATORY_TRACT | 1 refills | Status: DC | PRN

## 2023-10-18 MED ORDER — TRELEGY ELLIPTA 100-62.5-25 MCG/ACT IN AEPB: 1.0000 | INHALATION_SPRAY | Freq: Every day | RESPIRATORY_TRACT | 2 refills | Status: DC

## 2023-10-18 MED ORDER — CYANOCOBALAMIN 1000 MCG PO TABS: 1000.0000 ug | ORAL_TABLET | Freq: Every day | ORAL | 0 refills | Status: AC

## 2023-10-18 MED ORDER — BISACODYL 5 MG PO TBEC: 10.0000 mg | DELAYED_RELEASE_TABLET | Freq: Every day | ORAL | Status: DC

## 2023-10-18 MED ORDER — QUETIAPINE FUMARATE 25 MG PO TABS: 12.5000 mg | ORAL_TABLET | Freq: Two times a day (BID) | ORAL | 0 refills | Status: DC

## 2023-10-18 MED ORDER — HYDROXYZINE PAMOATE 25 MG PO CAPS: 25.0000 mg | ORAL_CAPSULE | Freq: Three times a day (TID) | ORAL | 0 refills | Status: DC | PRN

## 2023-10-18 NOTE — TOC Transition Note (Signed)
 Transition of Care [facility]) - Discharge Note   Patient Details  Name: Christine Sparks MRN: [id number] Date of Birth: [birth year]  [facility]) CM/SW Contact:  Shell Yandow C Algie Westry, RN Phone Number: 10/18/2023, 1:58 PM   Clinical Narrative:    Spoke with patient regarding discharge.She was advised referral was accepted by Louanna Rouse from [facility].   TOC signing off.           Patient Goals and CMS Choice            Discharge Placement                       Discharge Plan and Services Additional resources added to the After Visit Summary for                                       Social Drivers of Health (SDOH) Interventions SDOH Screenings   Food Insecurity: No Food Insecurity (10/15/2023)  Housing: Low Risk  (10/15/2023)  Transportation Needs: No Transportation Needs (10/15/2023)  Utilities: Not At Risk (10/15/2023)  Tobacco Use: High Risk (10/13/2023)     Readmission Risk Interventions     No data to display

## 2023-10-18 NOTE — Progress Notes (Signed)
 [facility]  ROUNDING NOTE   Subjective:   Patient seen sitting up in bed Complains of shortness of breath, usually in the mornings Tolerating meals Monitoring fluid intake.   Sodium 134  Objective:  Vital signs in last 24 hours:  Temp:  [97.4 F (36.3 C)-98.4 F (36.9 C)] 97.4 F (36.3 C) (01/15 1114) Pulse Rate:  [73-91] 91 (01/15 1400) Resp:  [17-20] 18 (01/15 1400) BP: (132-155)/(70-87) 145/70 (01/15 1114) SpO2:  [93 %-100 %] 93 % (01/15 1400)  Weight change:  Filed Weights   10/13/23 0852 10/15/23 1700 10/16/23 0417  Weight: 47.9 kg 52.4 kg 52.3 kg    Intake/Output: I/O last 3 completed shifts: In: 240 [P.O.:240] Out: 900 [Urine:900]   Intake/Output this shift:  Total I/O In: 240 [P.O.:240] Out: -   Physical Exam: General: NAD  Head: Normocephalic, atraumatic. Moist oral mucosal membranes  Eyes: Anicteric  Lungs:  Clear to auscultation, normal effort  Heart: Regular rate and rhythm  Abdomen:  Soft, nontender  Extremities:  No peripheral edema.  Neurologic: Nonfocal, moving all four extremities  Skin: No lesions       Basic Metabolic Panel: Recent Labs  Lab 10/14/23 0129 10/15/23 0329 10/16/23 0557 10/16/23 1808 10/17/23 0215 10/18/23 0411  NA 127* 123* 121* 124* 131* 134*  K 4.4 4.5 4.6  --  3.7 3.9  CL 96* 88* 87*  --  93* 96*  CO2 24 24 27   --  30 29  GLUCOSE 134* 120* 152*  --  97 81  BUN 6* 10 9  --  9 10  CREATININE 0.54 0.60 0.57  --  0.64 0.56  CALCIUM  8.8* 8.5* 8.4*  --  8.5* 8.4*  MG 1.8 1.9 1.9  --  2.1  --   PHOS 4.0 3.8 3.5  --  3.6  --     Liver Function Tests: Recent Labs  Lab 10/13/23 0850 10/16/23 0557  AST 30 31  ALT 25 26  ALKPHOS 81 50  BILITOT 0.6 0.5  PROT 5.8* 5.0*  ALBUMIN 3.5 3.1*   Recent Labs  Lab 10/13/23 0850  LIPASE 28   No results for input(s): "AMMONIA" in the last 168 hours.  CBC: Recent Labs  Lab 10/13/23 0850 10/14/23 0129 10/15/23 0329 10/16/23 0557 10/17/23 0215  10/18/23 0411  WBC 5.3 3.8* 11.1* 7.4 8.9 8.9  NEUTROABS 3.3  --   --   --   --   --   HGB 12.3 11.4* 11.2* 10.6* 10.9* 10.9*  HCT 34.4* 32.7* 33.0* 29.7* 30.8* 31.5*  MCV 92.0 94.8 95.9 91.1 92.8 95.5  PLT 321 301 312 312 321 361    Cardiac Enzymes: No results for input(s): "CKTOTAL", "CKMB", "CKMBINDEX", "TROPONINI" in the last 168 hours.  BNP: Invalid input(s): "POCBNP"  CBG: No results for input(s): "GLUCAP" in the last 168 hours.  Microbiology: Results for orders placed or performed during the hospital encounter of 10/13/23  Resp panel by RT-PCR (RSV, Flu A&B, Covid) Anterior Nasal Swab     Status: None   Collection Time: 10/13/23  8:50 AM   Specimen: Anterior Nasal Swab  Result Value Ref Range Status   SARS Coronavirus 2 by RT PCR NEGATIVE NEGATIVE Final    Comment: (NOTE) SARS-CoV-2 target nucleic acids are NOT DETECTED.  The SARS-CoV-2 RNA is generally detectable in upper respiratory specimens during the acute phase of infection. The lowest concentration of SARS-CoV-2 viral copies this assay can detect is 138 copies/mL. A negative result does  not preclude SARS-Cov-2 infection and should not be used as the sole basis for treatment or other patient management decisions. A negative result may occur with  improper specimen collection/handling, submission of specimen other than nasopharyngeal swab, presence of viral mutation(s) within the areas targeted by this assay, and inadequate number of viral copies(<138 copies/mL). A negative result must be combined with clinical observations, patient history, and epidemiological information. The expected result is Negative.  Fact Sheet for Patients:  BloggerCourse.com  Fact Sheet for Healthcare Providers:  SeriousBroker.it  This test is no t yet approved or cleared by the [place]  FDA and  has been authorized for detection and/or diagnosis of SARS-CoV-2 by FDA under an  Emergency Use Authorization (EUA). This EUA will remain  in effect (meaning this test can be used) for the duration of the COVID-19 declaration under Section 564(b)(1) of the Act, 21 U.S.C.section 360bbb-3(b)(1), unless the authorization is terminated  or revoked sooner.       Influenza A by PCR NEGATIVE NEGATIVE Final   Influenza B by PCR NEGATIVE NEGATIVE Final    Comment: (NOTE) The Xpert Xpress SARS-CoV-2/FLU/RSV plus assay is intended as an aid in the diagnosis of influenza from Nasopharyngeal swab specimens and should not be used as a sole basis for treatment. Nasal washings and aspirates are unacceptable for Xpert Xpress SARS-CoV-2/FLU/RSV testing.  Fact Sheet for Patients: BloggerCourse.com  Fact Sheet for Healthcare Providers: SeriousBroker.it  This test is not yet approved or cleared by the [place]  FDA and has been authorized for detection and/or diagnosis of SARS-CoV-2 by FDA under an Emergency Use Authorization (EUA). This EUA will remain in effect (meaning this test can be used) for the duration of the COVID-19 declaration under Section 564(b)(1) of the Act, 21 U.S.C. section 360bbb-3(b)(1), unless the authorization is terminated or revoked.     Resp Syncytial Virus by PCR NEGATIVE NEGATIVE Final    Comment: (NOTE) Fact Sheet for Patients: BloggerCourse.com  Fact Sheet for Healthcare Providers: SeriousBroker.it  This test is not yet approved or cleared by the [place]  FDA and has been authorized for detection and/or diagnosis of SARS-CoV-2 by FDA under an Emergency Use Authorization (EUA). This EUA will remain in effect (meaning this test can be used) for the duration of the COVID-19 declaration under Section 564(b)(1) of the Act, 21 U.S.C. section 360bbb-3(b)(1), unless the authorization is terminated or revoked.  Performed at [facility], [address] [place], [place] [address]   Respiratory (~20 pathogens) panel by PCR     Status: None   Collection Time: 10/14/23  1:29 AM   Specimen: Nasopharyngeal Swab; Respiratory  Result Value Ref Range Status   Adenovirus NOT DETECTED NOT DETECTED Final   Coronavirus 229E NOT DETECTED NOT DETECTED Final    Comment: (NOTE) The Coronavirus on the Respiratory Panel, DOES NOT test for the novel  Coronavirus (2019 nCoV)    Coronavirus HKU1 NOT DETECTED NOT DETECTED Final   Coronavirus NL63 NOT DETECTED NOT DETECTED Final   Coronavirus OC43 NOT DETECTED NOT DETECTED Final   Metapneumovirus NOT DETECTED NOT DETECTED Final   Rhinovirus / Enterovirus NOT DETECTED NOT DETECTED Final   Influenza A NOT DETECTED NOT DETECTED Final   Influenza B NOT DETECTED NOT DETECTED Final   Parainfluenza Virus 1 NOT DETECTED NOT DETECTED Final   Parainfluenza Virus 2 NOT DETECTED NOT DETECTED Final   Parainfluenza Virus 3 NOT DETECTED NOT DETECTED Final   Parainfluenza Virus 4 NOT DETECTED NOT DETECTED Final  Respiratory Syncytial Virus NOT DETECTED NOT DETECTED Final   Bordetella pertussis NOT DETECTED NOT DETECTED Final   Bordetella Parapertussis NOT DETECTED NOT DETECTED Final   Chlamydophila pneumoniae NOT DETECTED NOT DETECTED Final   Mycoplasma pneumoniae NOT DETECTED NOT DETECTED Final    Comment: Performed at [facility], [address] [place], [place] [address]    Coagulation Studies: No results for input(s): "LABPROT", "INR" in the last 72 hours.  Urinalysis: No results for input(s): "COLORURINE", "LABSPEC", "PHURINE", "GLUCOSEU", "HGBUR", "BILIRUBINUR", "KETONESUR", "PROTEINUR", "UROBILINOGEN", "NITRITE", "LEUKOCYTESUR" in the last 72 hours.  Invalid input(s): "APPERANCEUR"    Imaging: No results found.    Medications:     bisacodyl   10 mg Oral QHS   citalopram   40 mg Oral Daily   cyanocobalamin   1,000 mcg Intramuscular Daily   Followed by   Cecily Cohen ON  10/23/2023] vitamin B-12  1,000 mcg Oral Daily   enoxaparin  (LOVENOX ) injection  40 mg Subcutaneous Q24H   fluticasone  furoate-vilanterol  1 puff Inhalation Daily   And   umeclidinium bromide   1 puff Inhalation Daily   guaiFENesin   600 mg Oral BID   ipratropium-albuterol   3 mL Nebulization Q6H   nicotine   21 mg Transdermal Q24H   polyethylene glycol  17 g Oral BID   [START ON 10/19/2023] predniSONE   30 mg Oral Q breakfast   Followed by   Cecily Cohen ON 10/22/2023] predniSONE   20 mg Oral Q breakfast   Followed by   Cecily Cohen ON 10/25/2023] predniSONE   10 mg Oral Q breakfast   QUEtiapine   12.5 mg Oral BID   ramipril   10 mg Oral Daily   rosuvastatin   10 mg Oral Daily   sodium chloride   1 g Oral TID WC   Vitamin D  (Ergocalciferol )  50,000 Units Oral Q7 days   acetaminophen  **OR** acetaminophen , albuterol , bisacodyl , guaiFENesin -dextromethorphan, LORazepam , ondansetron  **OR** ondansetron  (ZOFRAN ) IV  Assessment/ Plan:  Ms. SUZY PONTE is a 63 y.o.  female with past medical conditions including hypertension, hyperlipidemia, COPD, tobacco abuse, and anxiety and depression, who was admitted to [facility] on 10/13/2023 for COPD (chronic obstructive pulmonary disease) (HCC) [J44.9] COPD exacerbation (HCC) [J44.1]   Hyponatremia likely secondary to increased free water intake with an element of SIADH. Sodium 123 on admission. CT chest negative for acute findings. Has received 1 dose of Tolvaptan  during this admission. Sodium 134 today. Patient encouraged to maintain 40oz fluid restriction and salt tabs three times daily. Will follow up with Dr Rhesa Celeste at discharge.   Due to correction of sodium, we will sign off at this time.    LOS: 5 Kenleigh Toback 1/15/20252:17 PM

## 2023-10-18 NOTE — Discharge Summary (Signed)
 [facility] Discharge Summary   Patient: Christine Sparks ZOX:[id number]  PCP: Aisha Hove, MD  Date of admission: 10/13/2023   Date of discharge:  10/18/2023     Discharge Diagnoses:  Principal Problem:   COPD (chronic obstructive pulmonary disease) (HCC) Active Problems:   Anxiety   Hyponatremia   Admitted From: Home Disposition:  Home with [facility] PT/OT  Recommendations for Outpatient Follow-up:  Follow-up with PCP in 1 week.  Wean off supplemental O2 admission gradually. Follow-up with pulmonologist in 1 week for PFTs Follow up LABS/TEST:  As above   Follow-up Information     Aisha Hove, MD Follow up in 1 week(s).   Specialty: Internal Medicine Contact information: [address] [place] [address] [phone]         Erskin Hearing, MD Follow up in 1 week(s).   Specialty: Pulmonary Disease Why: COPD for PFTs Contact information: [address] [place] [address] [phone]                Diet recommendation: Cardiac diet  Activity: The patient is advised to gradually reintroduce usual activities, as tolerated  Discharge Condition: stable  Code Status: Full code   History of present illness: As per the H and P dictated on admission Hospital Course:  Christine Sparks is a 62 y.o. female with medical history significant of COPD, HTN, HLD, cigarette smoking, anxiety/depression, presented with recurrent cough wheezing shortness of breath.  COPD exacerbation.  Patient had similar presentation 3 weeks ago.  She stayed well for 2 weeks and then started having progressive shortness of breath with wheezing and productive cough, getting worse for past 3 days. She called EMS this morning, [facility] arrived at their home and found patient hypoxic patient was given IV Solu-Medrol  and DuoNeb treatment and placed on BiPAP.     ED Course: Afebrile, no tachycardia nonhypotensive continue to be on BiPAP at ED.  Chest x-ray showed no acute infiltrates.   Blood work showed sodium 123, creatinine 3.4, glucose 122, VBG showed 7.38/42/56.  Troponin 8> 66, EKG showed no acute ST changes.    Assessment and Plan: # Acute hypoxic respiratory failure # Acute COPD exacerbation S/p BiPAP prn, IV Solu-Medrol  40 BID x 2 days, then started tapering prednisone .  S/p ICS and LABA, DuoNebs and as needed albuterol . Mucinex  600 mg p.o. twice daily, Gradually patient's condition improved, still needs 1-2 L oxygen during ambulation, has dyspnea on exertion but feeling much better at rest.  Continued Trelegy inhaler, albuterol  inhaler as needed, prednisone  tapering dose.  Robitussin DM as needed.  Continue supplemental O2 relation and gradually wean off.  Follow-up with PCP and pulmonary in 1 week.  # Elevated troponins -No chest pains, EKG showed no acute ST changes. -Clinically suspect elevated troponin secondary to demanding ischemia from acute COPD exacerbation. -Echocardiogram was done on last admission, we will not repeat echocardiogram this time Patient remained chest pain-free.   # Hypotonic hyponatremia -Workup on last admission showed hyponatremia secondary to dilutional and excessive water drinking. Continue fluid restriction 1.500/day.  Sodium level gradually improved, 134 today. Nephrology consulted, tolvaptan  15 mg one-time dose given on 1/13, and sodium chloride  1 g p.o. 3 times daily was given for 1 day. Na 134, no more need of treatment, follow with PCP and repeat BMP after 1 week.  Continue fluid restriction 1.5 L/day  # Anxiety/depression: Poorly controlled, start low-dose of p.o. Seroquel  twice daily Prescribed Atarax  25 mg p.o. 3 times daily as needed  for anxiety. # HTN and HLD: Stable, continue home BP meds and statin # Cigarette smoking: Cessation education at bedside. continue nicotine  patch # Vitamin D  deficiency: started vitamin D  50,000 units p.o. weekly, follow with PCP to repeat vitamin D  level after 3 to 6 months. # Vitamin B12 level  260, goal >400.  Started vitamin B12 1000 mcg IM injection daily during hospital stay followed by oral supplement on discharge.  Follow with PCP to repeat B12 level after 3 to 6 months.  Body mass index is 19.79 kg/m.  Nutrition Interventions:  - Patient was instructed, not to drive, operate heavy machinery, perform activities at heights, swimming or participation in water activities or provide baby sitting services while on Pain, Sleep and Anxiety Medications; until her outpatient Physician has advised to do so again.  - Also recommended to not to take more than prescribed Pain, Sleep and Anxiety Medications.  Patient was seen by physical therapy, who recommended Home health, which was arranged. On the day of the discharge the patient's vitals were stable, and no other acute medical condition were reported by patient. the patient was felt safe to be discharge at Home with Home health.  Consultants: None Procedures: None  Discharge Exam: General: Appear in no distress, no Rash; Oral Mucosa Clear, moist. Cardiovascular: S1 and S2 Present, no Murmur, Respiratory: normal respiratory effort, Bilateral Air entry present and no Crackles, no wheezes Abdomen: Bowel Sound present, Soft and no tenderness, no hernia Extremities: no Pedal edema, no calf tenderness Neurology: alert and oriented to time, place, and person affect appropriate.  Filed Weights   10/13/23 0852 10/15/23 1700 10/16/23 0417  Weight: 47.9 kg 52.4 kg 52.3 kg   Vitals:   10/18/23 0852 10/18/23 1114  BP: (!) 147/81 (!) 145/70  Pulse: 81 88  Resp: 20 20  Temp: 98.4 F (36.9 C) (!) 97.4 F (36.3 C)  SpO2: 100% 95%    DISCHARGE MEDICATION: Allergies as of 10/18/2023       Reactions   Zithromax  [azithromycin ] Nausea And Vomiting   Buspar  [buspirone ] Other (See Comments)   Made her "feel crazy"        Medication List     STOP taking these medications    busPIRone  15 MG tablet Commonly known as: BUSPAR     LORazepam  1 MG tablet Commonly known as: ATIVAN        TAKE these medications    albuterol  108 (90 Base) MCG/ACT inhaler Commonly known as: VENTOLIN  HFA Inhale 2 puffs into the lungs every 6 (six) hours as needed for shortness of breath.   citalopram  40 MG tablet Commonly known as: CELEXA  Take 1 tablet (40 mg total) by mouth daily.   cyanocobalamin  1000 MCG tablet Take 1 tablet (1,000 mcg total) by mouth daily. Start taking on: October 23, 2023   famotidine  40 MG tablet Commonly known as: PEPCID  Take 1 tablet (40 mg total) by mouth daily for 14 days.   guaiFENesin -dextromethorphan 100-10 MG/5ML syrup Commonly known as: ROBITUSSIN DM Take 5 mLs by mouth every 4 (four) hours as needed for cough.   hydrOXYzine  25 MG capsule Commonly known as: VISTARIL  Take 1 capsule (25 mg total) by mouth 3 (three) times daily as needed for anxiety.   ipratropium-albuterol  0.5-2.5 (3) MG/3ML Soln Commonly known as: DUONEB Take 3 mLs by nebulization every 6 (six) hours as needed.   nicotine  21 mg/24hr patch Commonly known as: NICODERM CQ  - dosed in mg/24 hours Place 1 patch (21 mg total) onto the skin  daily.   predniSONE  10 MG tablet Commonly known as: DELTASONE  Take 3 tablets (30 mg total) by mouth daily with breakfast for 3 days, THEN 2 tablets (20 mg total) daily with breakfast for 3 days, THEN 1 tablet (10 mg total) daily with breakfast for 3 days. Start taking on: October 19, 2023   QUEtiapine  25 MG tablet Commonly known as: SEROQUEL  Take 0.5 tablets (12.5 mg total) by mouth 2 (two) times daily.   ramipril  10 MG capsule Commonly known as: ALTACE  TAKE 1 CAPSULE BY MOUTH ONCE DAILY   rosuvastatin  10 MG tablet Commonly known as: CRESTOR  Take 1 tablet (10 mg total) by mouth daily.   Trelegy Ellipta  100-62.5-25 MCG/ACT Aepb Generic drug: Fluticasone -Umeclidin-Vilant Inhale 1 puff into the lungs daily.   Vitamin D  (Ergocalciferol ) 1.25 MG (50000 UNIT) Caps capsule Commonly  known as: DRISDOL  Take 1 capsule (50,000 Units total) by mouth every 7 (seven) days. Start taking on: October 23, 2023               Durable Medical Equipment  (From admission, onward)           Start     Ordered   10/16/23 1323  For home use only DME oxygen  Once       Question Answer Comment  Length of Need 6 Months   Mode or (Route) Nasal cannula   Liters per Minute 2   Frequency Continuous (stationary and portable oxygen unit needed)   Oxygen conserving device Yes   Oxygen delivery system Gas      10/16/23 1322           Allergies  Allergen Reactions   Zithromax  [Azithromycin ] Nausea And Vomiting   Buspar  [Buspirone ] Other (See Comments)    Made her "feel crazy"   Discharge Instructions     Call MD for:  difficulty breathing, headache or visual disturbances   Complete by: As directed    Call MD for:  extreme fatigue   Complete by: As directed    Call MD for:  persistant dizziness or light-headedness   Complete by: As directed    Call MD for:  persistant nausea and vomiting   Complete by: As directed    Call MD for:  severe uncontrolled pain   Complete by: As directed    Call MD for:  temperature >100.4   Complete by: As directed    Diet - low sodium heart healthy   Complete by: As directed    Discharge instructions   Complete by: As directed    Follow-up with PCP in 1 week.  Wean off supplemental O2 admission gradually. Follow-up with pulmonologist in 1 week for PFTs.   Increase activity slowly   Complete by: As directed        The results of significant diagnostics from this hospitalization (including imaging, microbiology, ancillary and laboratory) are listed below for reference.    Significant Diagnostic Studies: CT Chest High Resolution Result Date: 10/17/2023 CLINICAL DATA:  63 year old female with history of hyponatremia. EXAM: CT CHEST WITHOUT CONTRAST TECHNIQUE: Multidetector CT imaging of the chest was performed following the  standard protocol without intravenous contrast. High resolution imaging of the lungs, as well as inspiratory and expiratory imaging, was performed. RADIATION DOSE REDUCTION: This exam was performed according to the departmental dose-optimization program which includes automated exposure control, adjustment of the mA and/or kV according to patient size and/or use of iterative reconstruction technique. COMPARISON:  Chest CTA 09/19/2023. FINDINGS: Cardiovascular: Heart size is  normal. There is no significant pericardial fluid, thickening or pericardial calcification. There is aortic atherosclerosis, as well as atherosclerosis of the great vessels of the mediastinum and the coronary arteries, including calcified atherosclerotic plaque in the left anterior descending, left circumflex and right coronary arteries. Mediastinum/Nodes: No pathologically enlarged mediastinal or hilar lymph nodes. Please note that accurate exclusion of hilar adenopathy is limited on noncontrast CT scans. Esophagus is unremarkable in appearance. No axillary lymphadenopathy. Lungs/Pleura: High-resolution images demonstrate no significant regions of ground-glass attenuation, septal thickening, subpleural reticulation, parenchymal banding, traction bronchiectasis or honeycombing to indicate interstitial lung disease. Diffuse bronchial wall thickening with moderate centrilobular and paraseptal emphysema. No acute consolidative airspace disease. No pleural effusions. No suspicious appearing pulmonary nodules or masses are noted. Upper Abdomen: Aortic atherosclerosis. Musculoskeletal: There are no aggressive appearing lytic or blastic lesions noted in the visualized portions of the skeleton. IMPRESSION: 1. No evidence of interstitial lung disease. 2. Diffuse bronchial wall thickening with moderate centrilobular and paraseptal emphysema; imaging findings suggestive of underlying COPD. 3. Aortic atherosclerosis, in addition to three-vessel coronary  artery disease. Please note that although the presence of coronary artery calcium  documents the presence of coronary artery disease, the severity of this disease and any potential stenosis cannot be assessed on this non-gated CT examination. Assessment for potential risk factor modification, dietary therapy or pharmacologic therapy may be warranted, if clinically indicated. Aortic Atherosclerosis (ICD10-I70.0) and Emphysema (ICD10-J43.9). Electronically Signed   By: Alexandria Angel M.D.   On: 10/17/2023 09:41   DG Chest Port 1 View Result Date: 10/13/2023 CLINICAL DATA:  Shortness of breath.  History of COPD. EXAM: PORTABLE CHEST 1 VIEW COMPARISON:  09/19/2023; 06/20/2023; chest CT-09/19/2023 FINDINGS: Unchanged cardiac silhouette and mediastinal contours with atherosclerotic plaque within the thoracic aorta. The lungs are hyperexpanded with flattening of the bilateral hemidiaphragms and blunting the bilateral costophrenic angles. Biapical pleural-parenchymal thickening. No discrete focal airspace opacities. No pleural effusion or pneumothorax. No evidence of edema. No acute osseous abnormalities. IMPRESSION: Similar findings of lung hyperexpansion without superimposed acute cardiopulmonary disease. Electronically Signed   By: Robbi Childs M.D.   On: 10/13/2023 09:13   ECHOCARDIOGRAM COMPLETE Result Date: 09/20/2023    ECHOCARDIOGRAM REPORT   Patient Name:   TAIZ HORTON Date of Exam: 09/20/2023 Medical Rec #:  [id number]     Height:       64.0 in Accession #:    [id number]    Weight:       124.8 lb Date of Birth:  [birth year]     BSA:          1.601 m Patient Age:    62 years      BP:           126/76 mmHg Patient Gender: F             HR:           69 bpm. Exam Location:  [facility] Procedure: 2D Echo, Cardiac Doppler and Color Doppler Indications:     NSTEMI  History:         Patient has no prior history of Echocardiogram examinations.                  Acute MI, COPD; Risk Factors:Hypertension, Diabetes, Current                   Smoker and Dyslipidemia. Breast CA.  Sonographer:     Clarke Crouch Referring Phys:  [id number] STEVEN J NEWTON Diagnosing Phys: Emeterio Hansen  Gollan MD  Sonographer Comments: Image acquisition challenging due to COPD. IMPRESSIONS  1. Left ventricular ejection fraction, by estimation, is 60 to 65%. The left ventricle has normal function. The left ventricle has no regional wall motion abnormalities. Left ventricular diastolic parameters are indeterminate.  2. Right ventricular systolic function is normal. The right ventricular size is normal. Tricuspid regurgitation signal is inadequate for assessing PA pressure.  3. The mitral valve is normal in structure. No evidence of mitral valve regurgitation. No evidence of mitral stenosis.  4. The aortic valve is normal in structure. Aortic valve regurgitation is mild to moderate. Aortic valve sclerosis is present, with no evidence of aortic valve stenosis.  5. The inferior vena cava is normal in size with greater than 50% respiratory variability, suggesting right atrial pressure of 3 mmHg. FINDINGS  Left Ventricle: Left ventricular ejection fraction, by estimation, is 60 to 65%. The left ventricle has normal function. The left ventricle has no regional wall motion abnormalities. The left ventricular internal cavity size was normal in size. There is  no left ventricular hypertrophy. Left ventricular diastolic parameters are indeterminate. Right Ventricle: The right ventricular size is normal. No increase in right ventricular wall thickness. Right ventricular systolic function is normal. Tricuspid regurgitation signal is inadequate for assessing PA pressure. Left Atrium: Left atrial size was normal in size. Right Atrium: Right atrial size was normal in size. Pericardium: There is no evidence of pericardial effusion. Mitral Valve: The mitral valve is normal in structure. No evidence of mitral valve regurgitation. No evidence of mitral valve stenosis. MV peak gradient,  6.0 mmHg. The mean mitral valve gradient is 2.0 mmHg. Tricuspid Valve: The tricuspid valve is normal in structure. Tricuspid valve regurgitation is not demonstrated. No evidence of tricuspid stenosis. Aortic Valve: The aortic valve is normal in structure. Aortic valve regurgitation is mild to moderate. Aortic valve sclerosis is present, with no evidence of aortic valve stenosis. Aortic valve mean gradient measures 6.0 mmHg. Aortic valve peak gradient measures 12.8 mmHg. Aortic valve area, by VTI measures 2.03 cm. Pulmonic Valve: The pulmonic valve was normal in structure. Pulmonic valve regurgitation is not visualized. No evidence of pulmonic stenosis. Aorta: The aortic root is normal in size and structure. Venous: The inferior vena cava is normal in size with greater than 50% respiratory variability, suggesting right atrial pressure of 3 mmHg. IAS/Shunts: No atrial level shunt detected by color flow Doppler.  LEFT VENTRICLE PLAX 2D LVIDd:         4.50 cm   Diastology LVIDs:         2.70 cm   LV e' medial:    9.03 cm/s LV PW:         0.60 cm   LV E/e' medial:  8.9 LV IVS:        1.00 cm   LV e' lateral:   8.27 cm/s LVOT diam:     1.90 cm   LV E/e' lateral: 9.7 LV SV:         81 LV SV Index:   50 LVOT Area:     2.84 cm  RIGHT VENTRICLE RV Basal diam:  3.35 cm RV Mid diam:    2.90 cm RV S prime:     12.30 cm/s LEFT ATRIUM           Index        RIGHT ATRIUM          Index LA diam:      3.00 cm 1.87  cm/m   RA Area:     9.39 cm LA Vol (A2C): 44.6 ml 27.86 ml/m  RA Volume:   20.30 ml 12.68 ml/m LA Vol (A4C): 12.8 ml 7.99 ml/m  AORTIC VALVE                     PULMONIC VALVE AV Area (Vmax):    1.85 cm      PV Vmax:       0.80 m/s AV Area (Vmean):   1.72 cm      PV Peak grad:  2.6 mmHg AV Area (VTI):     2.03 cm AV Vmax:           179.00 cm/s AV Vmean:          114.000 cm/s AV VTI:            0.398 m AV Peak Grad:      12.8 mmHg AV Mean Grad:      6.0 mmHg LVOT Vmax:         117.00 cm/s LVOT Vmean:        69.300  cm/s LVOT VTI:          0.285 m LVOT/AV VTI ratio: 0.72  AORTA Ao Root diam: 3.10 cm MITRAL VALVE MV Area (PHT): 3.93 cm    SHUNTS MV Area VTI:   2.19 cm    Systemic VTI:  0.29 m MV Peak grad:  6.0 mmHg    Systemic Diam: 1.90 cm MV Mean grad:  2.0 mmHg MV Vmax:       1.22 m/s MV Vmean:      65.0 cm/s MV Decel Time: 193 msec MV E velocity: 80.20 cm/s MV A velocity: 58.70 cm/s MV E/A ratio:  1.37 Belva Boyden MD Electronically signed by Belva Boyden MD Signature Date/Time: 09/20/2023/5:16:43 PM    Final    CT Angio Chest PE W and/or Wo Contrast Result Date: 09/19/2023 CLINICAL DATA:  Acute shortness of breath.  History of COPD. EXAM: CT ANGIOGRAPHY CHEST WITH CONTRAST TECHNIQUE: Multidetector CT imaging of the chest was performed using the standard protocol during bolus administration of intravenous contrast. Multiplanar CT image reconstructions and MIPs were obtained to evaluate the vascular anatomy. RADIATION DOSE REDUCTION: This exam was performed according to the departmental dose-optimization program which includes automated exposure control, adjustment of the mA and/or kV according to patient size and/or use of iterative reconstruction technique. CONTRAST:  75mL OMNIPAQUE  IOHEXOL  350 MG/ML SOLN COMPARISON:  CT of the chest on 07/07/2005 FINDINGS: Cardiovascular: The pulmonary arteries are adequately opacified. There is no evidence of pulmonary embolism. Central pulmonary arteries are of normal caliber. The thoracic aorta is normal in caliber. Mild aortic atherosclerosis. The heart size is normal. No pericardial fluid identified. Scattered calcified coronary artery plaque present. Mediastinum/Nodes: No enlarged mediastinal, hilar, or axillary lymph nodes. Thyroid  gland, trachea, and esophagus demonstrate no significant findings. Lungs/Pleura: Moderate emphysematous lung disease with mild scattered pulmonary scarring bilaterally. There is no evidence of pulmonary edema, consolidation, pneumothorax,  nodule or pleural fluid. Upper Abdomen: Evidence of hepatic steatosis. Musculoskeletal: No chest wall abnormality. No acute or significant osseous findings. Review of the MIP images confirms the above findings. IMPRESSION: 1. No evidence of pulmonary embolism or other acute findings in the chest. 2. Moderate emphysematous lung disease with mild scattered pulmonary scarring bilaterally. 3. Hepatic steatosis. 4. Aortic atherosclerosis. Aortic Atherosclerosis (ICD10-I70.0) and Emphysema (ICD10-J43.9). Electronically Signed   By: Erica Hau M.D.   On: 09/19/2023 15:49  DG Chest Portable 1 View Result Date: 09/19/2023 CLINICAL DATA:  Shortness of breath. EXAM: PORTABLE CHEST 1 VIEW COMPARISON:  06/20/2023. FINDINGS: Bilateral lung fields are clear. Bilateral costophrenic angles are clear. Normal cardio-mediastinal silhouette. No acute osseous abnormalities. The soft tissues are within normal limits. IMPRESSION: No active disease. Electronically Signed   By: Beula Brunswick M.D.   On: 09/19/2023 13:48    Microbiology: Recent Results (from the past 240 hours)  Resp panel by RT-PCR (RSV, Flu A&B, Covid) Anterior Nasal Swab     Status: None   Collection Time: 10/13/23  8:50 AM   Specimen: Anterior Nasal Swab  Result Value Ref Range Status   SARS Coronavirus 2 by RT PCR NEGATIVE NEGATIVE Final    Comment: (NOTE) SARS-CoV-2 target nucleic acids are NOT DETECTED.  The SARS-CoV-2 RNA is generally detectable in upper respiratory specimens during the acute phase of infection. The lowest concentration of SARS-CoV-2 viral copies this assay can detect is 138 copies/mL. A negative result does not preclude SARS-Cov-2 infection and should not be used as the sole basis for treatment or other patient management decisions. A negative result may occur with  improper specimen collection/handling, submission of specimen other than nasopharyngeal swab, presence of viral mutation(s) within the areas targeted by  this assay, and inadequate number of viral copies(<138 copies/mL). A negative result must be combined with clinical observations, patient history, and epidemiological information. The expected result is Negative.  Fact Sheet for Patients:  BloggerCourse.com  Fact Sheet for Healthcare Providers:  SeriousBroker.it  This test is no t yet approved or cleared by the [place]  FDA and  has been authorized for detection and/or diagnosis of SARS-CoV-2 by FDA under an Emergency Use Authorization (EUA). This EUA will remain  in effect (meaning this test can be used) for the duration of the COVID-19 declaration under Section 564(b)(1) of the Act, 21 U.S.C.section 360bbb-3(b)(1), unless the authorization is terminated  or revoked sooner.       Influenza A by PCR NEGATIVE NEGATIVE Final   Influenza B by PCR NEGATIVE NEGATIVE Final    Comment: (NOTE) The Xpert Xpress SARS-CoV-2/FLU/RSV plus assay is intended as an aid in the diagnosis of influenza from Nasopharyngeal swab specimens and should not be used as a sole basis for treatment. Nasal washings and aspirates are unacceptable for Xpert Xpress SARS-CoV-2/FLU/RSV testing.  Fact Sheet for Patients: BloggerCourse.com  Fact Sheet for Healthcare Providers: SeriousBroker.it  This test is not yet approved or cleared by the [place]  FDA and has been authorized for detection and/or diagnosis of SARS-CoV-2 by FDA under an Emergency Use Authorization (EUA). This EUA will remain in effect (meaning this test can be used) for the duration of the COVID-19 declaration under Section 564(b)(1) of the Act, 21 U.S.C. section 360bbb-3(b)(1), unless the authorization is terminated or revoked.     Resp Syncytial Virus by PCR NEGATIVE NEGATIVE Final    Comment: (NOTE) Fact Sheet for Patients: BloggerCourse.com  Fact Sheet  for Healthcare Providers: SeriousBroker.it  This test is not yet approved or cleared by the [place]  FDA and has been authorized for detection and/or diagnosis of SARS-CoV-2 by FDA under an Emergency Use Authorization (EUA). This EUA will remain in effect (meaning this test can be used) for the duration of the COVID-19 declaration under Section 564(b)(1) of the Act, 21 U.S.C. section 360bbb-3(b)(1), unless the authorization is terminated or revoked.  Performed at [facility], [address] [place], [place] [address]   Respiratory (~20 pathogens)  panel by PCR     Status: None   Collection Time: 10/14/23  1:29 AM   Specimen: Nasopharyngeal Swab; Respiratory  Result Value Ref Range Status   Adenovirus NOT DETECTED NOT DETECTED Final   Coronavirus 229E NOT DETECTED NOT DETECTED Final    Comment: (NOTE) The Coronavirus on the Respiratory Panel, DOES NOT test for the novel  Coronavirus (2019 nCoV)    Coronavirus HKU1 NOT DETECTED NOT DETECTED Final   Coronavirus NL63 NOT DETECTED NOT DETECTED Final   Coronavirus OC43 NOT DETECTED NOT DETECTED Final   Metapneumovirus NOT DETECTED NOT DETECTED Final   Rhinovirus / Enterovirus NOT DETECTED NOT DETECTED Final   Influenza A NOT DETECTED NOT DETECTED Final   Influenza B NOT DETECTED NOT DETECTED Final   Parainfluenza Virus 1 NOT DETECTED NOT DETECTED Final   Parainfluenza Virus 2 NOT DETECTED NOT DETECTED Final   Parainfluenza Virus 3 NOT DETECTED NOT DETECTED Final   Parainfluenza Virus 4 NOT DETECTED NOT DETECTED Final   Respiratory Syncytial Virus NOT DETECTED NOT DETECTED Final   Bordetella pertussis NOT DETECTED NOT DETECTED Final   Bordetella Parapertussis NOT DETECTED NOT DETECTED Final   Chlamydophila pneumoniae NOT DETECTED NOT DETECTED Final   Mycoplasma pneumoniae NOT DETECTED NOT DETECTED Final    Comment: Performed at [facility], [address] [place], [place] [address]      Labs: CBC: Recent Labs  Lab 10/13/23 [phone] 10/14/23 0129 10/15/23 0329 10/16/23 0557 10/17/23 0215 10/18/23 0411  WBC 5.3 3.8* 11.1* 7.4 8.9 8.9  NEUTROABS 3.3  --   --   --   --   --   HGB 12.3 11.4* 11.2* 10.6* 10.9* 10.9*  HCT 34.4* 32.7* 33.0* 29.7* 30.8* 31.5*  MCV 92.0 94.8 95.9 91.1 92.8 95.5  PLT 321 301 312 312 321 361   Basic Metabolic Panel: Recent Labs  Lab 10/14/23 0129 10/15/23 0329 10/16/23 0557 10/16/23 1808 10/17/23 0215 10/18/23 0411  NA 127* 123* 121* 124* 131* 134*  K 4.4 4.5 4.6  --  3.7 3.9  CL 96* 88* 87*  --  93* 96*  CO2 24 24 27   --  30 29  GLUCOSE 134* 120* 152*  --  97 81  BUN 6* 10 9  --  9 10  CREATININE 0.54 0.60 0.57  --  0.64 0.56  CALCIUM  8.8* 8.5* 8.4*  --  8.5* 8.4*  MG 1.8 1.9 1.9  --  2.1  --   PHOS 4.0 3.8 3.5  --  3.6  --    Liver Function Tests: Recent Labs  Lab 10/13/23 0850 10/16/23 0557  AST 30 31  ALT 25 26  ALKPHOS 81 50  BILITOT 0.6 0.5  PROT 5.8* 5.0*  ALBUMIN 3.5 3.1*   Recent Labs  Lab 10/13/23 0850  LIPASE 28   No results for input(s): "AMMONIA" in the last 168 hours. Cardiac Enzymes: No results for input(s): "CKTOTAL", "CKMB", "CKMBINDEX", "TROPONINI" in the last 168 hours. BNP (last 3 results) Recent Labs    09/19/23 1102  BNP 137.3*   CBG: No results for input(s): "GLUCAP" in the last 168 hours.  Time spent: 35 minutes  Signed:  Althia Atlas  [facility] 10/18/2023 1:05 PM

## 2023-10-18 NOTE — Consult Note (Signed)
 PHARMACY CONSULT NOTE - Tolvaptan   Pharmacy Consult for Tolvaptan  Monitoring  Recent Labs: Potassium (mmol/L)  Date Value  10/18/2023 3.9   Magnesium (mg/dL)  Date Value  29/56/2130 2.1   Calcium  (mg/dL)  Date Value  86/57/8469 8.4 (L)   Albumin (g/dL)  Date Value  62/95/2841 3.1 (L)  01/09/2023 4.2   Phosphorus (mg/dL)  Date Value  32/44/0102 3.6   Sodium (mmol/L)  Date Value  10/18/2023 134 (L)  01/09/2023 131 (L)   Assessment  ALANEE DISABATO is a 63 y.o. female presenting with a COPD exacerbation. PMH significant for COPD, HTN, HLD, cigarette smoking, & anxiety/depression. Patient found to have serum Na of 123 on admission. Serum Na of 121 today. Pharmacy has been consulted to monitor tolvaptan .  Drug interactions: ramipril , rosuvastatin    Goal of Therapy:  Na > 130 Do not exceed increase in Na by 8 mEq/L per 8 hours or 12 mEq/L in 24 hours  Date/Time Sodium Level Comments  1/13 0656 121  Baseline   Tolvaptan  15 mg given @ 1229   1/13 1808 124 Na increased 3 mEq/L in 6 hrs  1/14 0215 131 Na increased 7 mEq/L in 8 hrs  1/15 0523 134  Na increased 3 mEq/L in 24 hrs   Plan:  No intervention recommended at this time; pharmacy will continue to monitor peripherally  Will continue to monitor Na levels daily  Salt tabs TID started yesterday per nephrology  Continue to monitor for signs of clinical improvement and recommendations per nephrology  Pansy Bogus, PharmD Pharmacy Resident  10/18/2023 7:55 AM

## 2023-10-18 NOTE — Plan of Care (Signed)

## 2023-10-18 NOTE — Plan of Care (Signed)

## 2023-10-23 ENCOUNTER — Telehealth: Payer: Self-pay

## 2023-10-23 NOTE — Telephone Encounter (Signed)
Christine Sparks, PT called and is requesting verbal orders  Week 1- 1visit, week 2- 2 visits, week 3- 1 visit  Pt has declined occupational therapy  Please advise

## 2023-10-26 ENCOUNTER — Telehealth: Payer: Self-pay | Admitting: Internal Medicine

## 2023-10-26 NOTE — Telephone Encounter (Signed)
Irving Burton, PT with [facility], left VM that patient has decided to stop her home health due to having a $75 copay per visit - patient stated it was too much for her. She was only seen for the initial visit. Just FYI.

## 2023-11-10 ENCOUNTER — Ambulatory Visit: Payer: Managed Care, Other (non HMO) | Admitting: Pediatrics

## 2023-11-10 ENCOUNTER — Encounter: Payer: Self-pay | Admitting: Pediatrics

## 2023-11-10 VITALS — BP 94/60 | HR 86 | Ht 64.0 in | Wt 115.6 lb

## 2023-11-10 DIAGNOSIS — J441 Chronic obstructive pulmonary disease with (acute) exacerbation: Secondary | ICD-10-CM

## 2023-11-10 DIAGNOSIS — Z1330 Encounter for screening examination for mental health and behavioral disorders, unspecified: Secondary | ICD-10-CM | POA: Diagnosis not present

## 2023-11-10 DIAGNOSIS — F419 Anxiety disorder, unspecified: Secondary | ICD-10-CM | POA: Diagnosis not present

## 2023-11-10 DIAGNOSIS — Z720 Tobacco use: Secondary | ICD-10-CM | POA: Diagnosis not present

## 2023-11-10 DIAGNOSIS — E871 Hypo-osmolality and hyponatremia: Secondary | ICD-10-CM | POA: Diagnosis not present

## 2023-11-10 DIAGNOSIS — Z716 Tobacco abuse counseling: Secondary | ICD-10-CM | POA: Diagnosis not present

## 2023-11-10 DIAGNOSIS — Z7689 Persons encountering health services in other specified circumstances: Secondary | ICD-10-CM

## 2023-11-10 DIAGNOSIS — Z1231 Encounter for screening mammogram for malignant neoplasm of breast: Secondary | ICD-10-CM

## 2023-11-10 MED ORDER — CLONAZEPAM 0.25 MG PO TBDP: 0.2500 mg | ORAL_TABLET | Freq: Every day | ORAL | 0 refills | Status: DC | PRN

## 2023-11-10 NOTE — Assessment & Plan Note (Addendum)
 Wheezing noted on exam. Recent admission for exacerbations. Anxious today but otherwise well appearing. Patient has albuterol  prescription but needs to pick it up. Requesting pulm referall. Continue trellegy. -Encourage patient to pick up albuterol  inhaler.

## 2023-11-10 NOTE — Assessment & Plan Note (Addendum)
 Daily panic attacks since discontinuation of Ativan . Celexa  not providing sufficient relief. Hydroxyzine  not effective. Negative experience with Seroquel . Suspect some degree of withdrawal from ativan  given previously taken all her life. Will do longer acting benzo as we adjusts SSRI therapy. Reviewed risk w medication and importance to avoid w alcohol and other sedating medications. -Start Klonopin  as needed for panic attacks. -Consider switching from Celexa  to another SSRI such as lexapro -Follow-up in 1 week to assess response to Klonopin .

## 2023-11-10 NOTE — Assessment & Plan Note (Signed)
 Using nicotine  patches. Does not need refills. She has smoked two cigarettes since initiating. Will continue to adjust nicotine  replacement based on needs. Time spent: .
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
## 2023-11-10 NOTE — Assessment & Plan Note (Signed)
 Previous low sodium level noted in hospital. -Repeat sodium level today.

## 2023-11-10 NOTE — Patient Instructions (Addendum)
 Plan: We will likely switch you from the celexa  to something else We will start a low dose medication similar to ativan  - this is called klonopin  0.25mg . You can take this up to twice daily as needed.  We will follow up in 1 week.  You have an order for:  []   2D Mammogram  [x]   3D Mammogram  []   Bone Density     Please call for appointment:  [facility]  [address] [place] [address] [phone] [facility] [address] [place], [place] [address] [phone] [facility] [address] [place], [place] [address] [phone]   Make sure to wear two-piece clothing.  No lotions, powders, or deodorants the day of the appointment. Make sure to bring picture ID and insurance card.  Bring list of medications you are currently taking including any supplements.   Schedule your [place] screening mammogram through MyChart!   Log into your MyChart account.  Go to 'Visit' (or 'Appointments' if on mobile App) --> Schedule an Appointment  Under 'Select a Reason for Visit' choose the Mammogram Screening option.  Complete the pre-visit questions and select the time and place that best fits your schedule.

## 2023-11-11 LAB — BASIC METABOLIC PANEL
BUN/Creatinine Ratio: 11 — ABNORMAL LOW (ref 12–28)
BUN: 7 mg/dL — ABNORMAL LOW (ref 8–27)
CO2: 23 mmol/L (ref 20–29)
Calcium: 9 mg/dL (ref 8.7–10.3)
Chloride: 93 mmol/L — ABNORMAL LOW (ref 96–106)
Creatinine, Ser: 0.61 mg/dL (ref 0.57–1.00)
Glucose: 89 mg/dL (ref 70–99)
Potassium: 4.4 mmol/L (ref 3.5–5.2)
Sodium: 130 mmol/L — ABNORMAL LOW (ref 134–144)
eGFR: 100 mL/min/{1.73_m2} (ref >59)

## 2023-11-13 ENCOUNTER — Telehealth: Payer: Self-pay | Admitting: Pediatrics

## 2023-11-13 NOTE — Telephone Encounter (Signed)
 Patient and pharmacy have confirmed that clonazePAM  (KLONOPIN ) 0.25 MG disintegrating tablet was not received and would like PCP to resend in prescription.    [facility] - [place], [place] - [address] Phone: [phone]  Fax: [phone]

## 2023-11-13 NOTE — Telephone Encounter (Signed)
 Spoke with pharmacy and confirmed patient has already picked up the medication.

## 2023-11-17 ENCOUNTER — Ambulatory Visit: Payer: Managed Care, Other (non HMO) | Admitting: Pediatrics

## 2023-11-17 ENCOUNTER — Encounter: Payer: Self-pay | Admitting: Pediatrics

## 2023-11-17 VITALS — BP 132/74 | HR 79 | Ht 63.0 in | Wt 118.0 lb

## 2023-11-17 DIAGNOSIS — E871 Hypo-osmolality and hyponatremia: Secondary | ICD-10-CM | POA: Diagnosis not present

## 2023-11-17 DIAGNOSIS — F321 Major depressive disorder, single episode, moderate: Secondary | ICD-10-CM | POA: Insufficient documentation

## 2023-11-17 DIAGNOSIS — Z131 Encounter for screening for diabetes mellitus: Secondary | ICD-10-CM

## 2023-11-17 DIAGNOSIS — F410 Panic disorder [episodic paroxysmal anxiety] without agoraphobia: Secondary | ICD-10-CM

## 2023-11-17 DIAGNOSIS — F419 Anxiety disorder, unspecified: Secondary | ICD-10-CM | POA: Diagnosis not present

## 2023-11-17 DIAGNOSIS — F322 Major depressive disorder, single episode, severe without psychotic features: Secondary | ICD-10-CM

## 2023-11-17 DIAGNOSIS — Z13220 Encounter for screening for lipoid disorders: Secondary | ICD-10-CM

## 2023-11-17 MED ORDER — DIAZEPAM 2 MG PO TABS: 2.0000 mg | ORAL_TABLET | Freq: Every day | ORAL | 0 refills | Status: AC | PRN

## 2023-11-17 MED ORDER — SERTRALINE HCL 50 MG PO TABS: 50.0000 mg | ORAL_TABLET | Freq: Every day | ORAL | 3 refills | Status: DC

## 2023-11-17 NOTE — Progress Notes (Signed)
 Office Visit  BP 132/74 (BP Location: Left Arm, Patient Position: Sitting, Cuff Size: Normal)   Pulse 79   Ht 5\' 3"  (1.6 m)   Wt 118 lb (53.5 kg)   SpO2 96%   BMI 20.90 kg/m    Subjective:    Patient ID: Christine Sparks, female    DOB: December 11, 1960, 63 y.o.   MRN: [id number]  HPI: Christine Sparks is a 63 y.o. female  Chief Complaint  Patient presents with   Follow-up    Panic attack     Discussed the use of AI scribe software for clinical note transcription with the patient, who gave verbal consent to proceed.  History of Present Illness   Christine Sparks is a 63 year old female with anxiety who presents for medication management.  She feels 'a little foggy' on her current medication, Celexa, which she takes once daily. She has been on Celexa since 07-Dec-2004 following the death of a close relative and has been stable on it, though she experiences some fogginess. She did not experience this fogginess with Ativan. She has a history of panic attacks in her twenties, which were severe enough to limit her ability to go out, but she has not had any since then until recent events.  She has been prescribed a low dose of her current medication due to availability issues at the pharmacy, receiving 0.125 mg tablets instead of the 0.25 mg. She takes one tablet daily and finds it effective, though it makes her feel foggy. She has previously taken Valium a long time ago and found it effective without issues.  She has been informed that Celexa can cause low sodium levels, which was noted during a recent hospital stay. She is currently staying hydrated and has noticed increased urination with increased water intake.  She quit smoking after many years and has not undergone lung cancer screening, although imaging during a recent hospital admission showed no nodules.  Her father passed away from lung cancer, but no other family members have had it.  She was incorrectly noted to have diabetes in her medical  chart, which she does not have.     Relevant past medical, surgical, family and social history reviewed and updated as indicated. Interim medical history since our last visit reviewed. Allergies and medications reviewed and updated.  ROS per HPI unless specifically indicated above     Objective:    BP 132/74 (BP Location: Left Arm, Patient Position: Sitting, Cuff Size: Normal)   Pulse 79   Ht 5\' 3"  (1.6 m)   Wt 118 lb (53.5 kg)   SpO2 96%   BMI 20.90 kg/m   Wt Readings from Last 3 Encounters:  11/17/23 118 lb (53.5 kg)  11/10/23 115 lb 9.6 oz (52.4 kg)  10/16/23 115 lb 4.8 oz (52.3 kg)     Physical Exam Constitutional:      Appearance: Normal appearance.  HENT:     Head: Normocephalic and atraumatic.  Eyes:     Pupils: Pupils are equal, round, and reactive to light.  Cardiovascular:     Rate and Rhythm: Normal rate and regular rhythm.     Pulses: Normal pulses.     Heart sounds: Normal heart sounds.  Pulmonary:     Effort: Pulmonary effort is normal.     Breath sounds: Normal breath sounds.  Musculoskeletal:        General: Normal range of motion.     Cervical back: Normal range of motion.  Skin:    General: Skin is warm and dry.     Capillary Refill: Capillary refill takes less than 2 seconds.  Neurological:     General: No focal deficit present.     Mental Status: She is alert. Mental status is at baseline.  Psychiatric:        Mood and Affect: Mood normal.        Behavior: Behavior normal.         11/10/2023    3:08 PM  Depression screen PHQ 2/9  Decreased Interest 2  Down, Depressed, Hopeless 2  PHQ - 2 Score 4  Altered sleeping 3  Tired, decreased energy 3  Change in appetite 3  Feeling bad or failure about yourself  1  Trouble concentrating 3  Moving slowly or fidgety/restless 0  Suicidal thoughts 0  PHQ-9 Score 17       11/10/2023    3:08 PM  GAD 7 : Generalized Anxiety Score  Nervous, Anxious, on Edge 3  Control/stop worrying 3  Worry too  much - different things 2  Trouble relaxing 1  Restless 0  Easily annoyed or irritable 1  Afraid - awful might happen 0  Total GAD 7 Score 10       Assessment & Plan:  Assessment & Plan   Panic attacks Anxiety Assessment & Plan: Patient reports feeling "foggy" on current regimen of klonopin 0.125mg  daily. No similar side effects reported with previous use of Ativan. Patient has a history of panic attacks. -Discontinue klonopin -Start Valium, dose unspecified, twice daily as needed. Patient to monitor for efficacy and side effects. -Check in quickly to assess response to Valium.  Orders: -     diazePAM; Take 1 tablet (2 mg total) by mouth daily as needed for up to 14 days for anxiety.  Dispense: 14 tablet; Refill: 0 -     Sertraline HCl; Take 1 tablet (50 mg total) by mouth daily.  Dispense: 30 tablet; Refill: 3   Moderately severe major depression (HCC) Assessment & Plan: Long-term use of Celexa 40mg  daily, with unclear efficacy. Discussed potential switch to Zoloft, a medication with a similar mechanism but potentially better efficacy for anxiety. -Taper Celexa by taking half dose for 3-4 days. -Start Zoloft 50mg  daily while tapering Celexa. -Monitor for any withdrawal symptoms or side effects during transition.   Hyponatremia Assessment & Plan: Seen on repeat blood work after recent hospitalization. Plan to resend labs as below.  Orders: -     Comprehensive metabolic panel -     Creatinine, urine, random -     Osmolality -     Osmolality, urine -     Sodium, urine, random -     Thyroid Panel With TSH    Follow up plan: Return in about 2 weeks (around 12/01/2023) for mood.  Jackolyn Confer, MD

## 2023-11-17 NOTE — Patient Instructions (Addendum)
Plan: STOP the klonopin, switching to valium. Feel free to either turn that medication in to the pharmacy or throw it out.   We will try valium as needed. If feels too sedating, please cut it in half.   We are going to switch your celexa to zoloft.   The way to do this is: - Cut your celexa pill in half for 3-4 days (some people need 7 days or even up to 14 days of taking half while they are coming off - Start zoloft 50mg  daily. You can take 1/2 tab for the first 3-4 days. If tolerating ok, feel free to take the full tab  If you are not feeling well with the switch, take half of both with the plan to take the full tab of zoloft as early as you're able to tolerate it.  I will call you with your lab results

## 2023-11-18 LAB — THYROID PANEL WITH TSH
Free Thyroxine Index: 1.7 (ref 1.2–4.9)
T3 Uptake Ratio: 29 % (ref 24–39)
T4, Total: 5.9 ug/dL (ref 4.5–12.0)
TSH: 0.983 u[IU]/mL (ref 0.450–4.500)

## 2023-11-18 LAB — COMPREHENSIVE METABOLIC PANEL
ALT: 20 [IU]/L (ref 0–32)
AST: 24 [IU]/L (ref 0–40)
Albumin: 3.8 g/dL — ABNORMAL LOW (ref 3.9–4.9)
Alkaline Phosphatase: 103 [IU]/L (ref 44–121)
BUN/Creatinine Ratio: 12 (ref 12–28)
BUN: 7 mg/dL — ABNORMAL LOW (ref 8–27)
Bilirubin Total: 0.3 mg/dL (ref 0.0–1.2)
CO2: 22 mmol/L (ref 20–29)
Calcium: 9.2 mg/dL (ref 8.7–10.3)
Chloride: 94 mmol/L — ABNORMAL LOW (ref 96–106)
Creatinine, Ser: 0.6 mg/dL (ref 0.57–1.00)
Globulin, Total: 1.9 g/dL (ref 1.5–4.5)
Glucose: 83 mg/dL (ref 70–99)
Potassium: 4.7 mmol/L (ref 3.5–5.2)
Sodium: 132 mmol/L — ABNORMAL LOW (ref 134–144)
Total Protein: 5.7 g/dL — ABNORMAL LOW (ref 6.0–8.5)
eGFR: 100 mL/min/{1.73_m2} (ref >59)

## 2023-11-18 LAB — OSMOLALITY: Osmolality Meas: 268 mosm/kg — ABNORMAL LOW (ref 280–301)

## 2023-11-19 LAB — SODIUM, URINE, RANDOM: Sodium, Ur: 50 mmol/L

## 2023-11-19 LAB — OSMOLALITY, URINE: Osmolality, Ur: 420 mosm/kg

## 2023-11-19 LAB — CREATININE, URINE, RANDOM: Creatinine, Urine: 123.7 mg/dL

## 2023-11-26 DIAGNOSIS — F410 Panic disorder [episodic paroxysmal anxiety] without agoraphobia: Secondary | ICD-10-CM | POA: Insufficient documentation

## 2023-11-26 NOTE — Assessment & Plan Note (Signed)
 Seen on repeat blood work after recent hospitalization. Plan to resend labs as below.

## 2023-11-26 NOTE — Assessment & Plan Note (Signed)
 Patient reports feeling "foggy" on current regimen of Librium 0.125mg  daily. No similar side effects reported with previous use of Ativan. Patient has a history of panic attacks. -Discontinue Librium. -Start Valium, dose unspecified, twice daily as needed. Patient to monitor for efficacy and side effects. -Check in quickly to assess response to Valium.

## 2023-11-26 NOTE — Assessment & Plan Note (Signed)
 Long-term use of Celexa 40mg  daily, with unclear efficacy. Discussed potential switch to Zoloft, a medication with a similar mechanism but potentially better efficacy for anxiety. -Taper Celexa by taking half dose for 3-4 days. -Start Zoloft 50mg  daily while tapering Celexa. -Monitor for any withdrawal symptoms or side effects during transition.

## 2023-12-01 ENCOUNTER — Ambulatory Visit: Payer: Managed Care, Other (non HMO) | Admitting: Pediatrics

## 2023-12-06 ENCOUNTER — Ambulatory Visit: Payer: Managed Care, Other (non HMO) | Admitting: Pediatrics

## 2023-12-06 ENCOUNTER — Encounter: Payer: Self-pay | Admitting: Pediatrics

## 2023-12-06 VITALS — BP 94/58 | HR 81 | Temp 97.6°F | Resp 16 | Wt 117.4 lb

## 2023-12-06 DIAGNOSIS — F410 Panic disorder [episodic paroxysmal anxiety] without agoraphobia: Secondary | ICD-10-CM | POA: Diagnosis not present

## 2023-12-06 DIAGNOSIS — E782 Mixed hyperlipidemia: Secondary | ICD-10-CM

## 2023-12-06 DIAGNOSIS — I10 Essential (primary) hypertension: Secondary | ICD-10-CM

## 2023-12-06 DIAGNOSIS — F17210 Nicotine dependence, cigarettes, uncomplicated: Secondary | ICD-10-CM | POA: Diagnosis not present

## 2023-12-06 DIAGNOSIS — F419 Anxiety disorder, unspecified: Secondary | ICD-10-CM

## 2023-12-06 DIAGNOSIS — Z1330 Encounter for screening examination for mental health and behavioral disorders, unspecified: Secondary | ICD-10-CM | POA: Diagnosis not present

## 2023-12-06 DIAGNOSIS — Z8639 Personal history of other endocrine, nutritional and metabolic disease: Secondary | ICD-10-CM

## 2023-12-06 DIAGNOSIS — E871 Hypo-osmolality and hyponatremia: Secondary | ICD-10-CM

## 2023-12-06 DIAGNOSIS — J441 Chronic obstructive pulmonary disease with (acute) exacerbation: Secondary | ICD-10-CM

## 2023-12-06 MED ORDER — CITALOPRAM HYDROBROMIDE 40 MG PO TABS: 40.0000 mg | ORAL_TABLET | Freq: Every day | ORAL | 3 refills | Status: DC

## 2023-12-06 MED ORDER — ALBUTEROL SULFATE HFA 108 (90 BASE) MCG/ACT IN AERS: 2.0000 | INHALATION_SPRAY | Freq: Four times a day (QID) | RESPIRATORY_TRACT | 1 refills | Status: DC | PRN

## 2023-12-06 MED ORDER — DIAZEPAM 2 MG PO TABS: 1.0000 mg | ORAL_TABLET | Freq: Every day | ORAL | 2 refills | Status: DC | PRN

## 2023-12-06 MED ORDER — LISINOPRIL 5 MG PO TABS: 5.0000 mg | ORAL_TABLET | Freq: Every day | ORAL | 3 refills | Status: DC

## 2023-12-06 MED ORDER — ROSUVASTATIN CALCIUM 5 MG PO TABS: 5.0000 mg | ORAL_TABLET | Freq: Every day | ORAL | 3 refills | Status: DC

## 2023-12-06 MED ORDER — NICOTINE 7 MG/24HR TD PT24
7.0000 mg | MEDICATED_PATCH | Freq: Every day | TRANSDERMAL | 0 refills | Status: DC
Start: 2023-12-06 — End: 2024-07-03

## 2023-12-06 MED ORDER — NICOTINE 14 MG/24HR TD PT24
14.0000 mg | MEDICATED_PATCH | Freq: Every day | TRANSDERMAL | 0 refills | Status: DC
Start: 2023-12-06 — End: 2024-06-18

## 2023-12-06 NOTE — Progress Notes (Signed)
 Office Visit  BP (!) 94/58 (BP Location: Left Arm, Patient Position: Sitting, Cuff Size: Normal)   Pulse 81   Temp 97.6 F (36.4 C) (Oral)   Resp 16   Wt 117 lb 6.4 oz (53.3 kg)   SpO2 96%   BMI 20.80 kg/m    Subjective:    Patient ID: Christine Sparks, female    DOB: 27-Dec-1960, 63 y.o.   MRN: [id number]  HPI: Christine Sparks is a 63 y.o. female  Chief Complaint  Patient presents with   Depression   Anxiety    Zoloft no help, gave nightmares and hard time sleeping. Valium however big help.     Discussed the use of AI scribe software for clinical note transcription with the patient, who gave verbal consent to proceed.  History of Present Illness   Christine Sparks is a 63 year old female with anxiety and depression who presents for medication management.  She was unable to tolerate Zoloft due to increased anxiety and nightmares, leading her to revert to using Celexa and Valium, which she finds effective. She is currently taking Celexa 40 mg daily and Valium, half a tablet in the morning and half at night, which helps her feel more rested and calm.  She has a history of low sodium levels, previously identified as SIADH, likely related to Celexa use. Recent blood work showed improved sodium levels, though still slightly low.  She experiences muscle tightness and pain in her legs, which she attributes to possibly not moving enough. She has been on Crestor for a long time and suspects it might contribute to her muscle pain. She uses a cane for balance as a precaution.  Her blood pressure was noted to be low, which she does not feel. She has been on ramipril for a long time and does not monitor her blood pressure at home. She has experienced weight loss and is trying to gain weight by adjusting her diet, including eating more protein-rich foods.  She has a history of smoking and is currently using nicotine patches to quit. She occasionally lapses but has plenty of patches available. She  was previously smoking almost a pack a day before hospitalization.        Relevant past medical, surgical, family and social history reviewed and updated as indicated. Interim medical history since our last visit reviewed. Allergies and medications reviewed and updated.  ROS per HPI unless specifically indicated above     Objective:    BP (!) 94/58 (BP Location: Left Arm, Patient Position: Sitting, Cuff Size: Normal)   Pulse 81   Temp 97.6 F (36.4 C) (Oral)   Resp 16   Wt 117 lb 6.4 oz (53.3 kg)   SpO2 96%   BMI 20.80 kg/m   Wt Readings from Last 3 Encounters:  12/06/23 117 lb 6.4 oz (53.3 kg)  11/17/23 118 lb (53.5 kg)  11/10/23 115 lb 9.6 oz (52.4 kg)     Physical Exam Constitutional:      Appearance: Normal appearance.  HENT:     Head: Normocephalic and atraumatic.  Eyes:     Pupils: Pupils are equal, round, and reactive to light.  Cardiovascular:     Rate and Rhythm: Normal rate and regular rhythm.     Pulses: Normal pulses.     Heart sounds: Normal heart sounds.  Pulmonary:     Effort: Pulmonary effort is normal.     Breath sounds: Normal breath sounds.  Abdominal:  General: Abdomen is flat.     Palpations: Abdomen is soft.  Musculoskeletal:        General: Normal range of motion.     Cervical back: Normal range of motion.  Skin:    General: Skin is warm and dry.  Neurological:     General: No focal deficit present.     Mental Status: She is alert. Mental status is at baseline.  Psychiatric:        Mood and Affect: Mood normal.        Behavior: Behavior normal.         12/06/2023    1:16 PM 11/10/2023    3:08 PM  Depression screen PHQ 2/9  Decreased Interest 1 2  Down, Depressed, Hopeless 0 2  PHQ - 2 Score 1 4  Altered sleeping 1 3  Tired, decreased energy 1 3  Change in appetite 1 3  Feeling bad or failure about yourself  0 1  Trouble concentrating 0 3  Moving slowly or fidgety/restless 0 0  Suicidal thoughts 0 0  PHQ-9 Score 4 17   Difficult doing work/chores Somewhat difficult        12/06/2023    1:16 PM 11/10/2023    3:08 PM  GAD 7 : Generalized Anxiety Score  Nervous, Anxious, on Edge  3  Control/stop worrying 0 3  Worry too much - different things 0 2  Trouble relaxing 1 1  Restless 0 0  Easily annoyed or irritable 0 1  Afraid - awful might happen 0 0  Total GAD 7 Score  10  Anxiety Difficulty Somewhat difficult        Assessment & Plan:  Assessment & Plan   Essential hypertension, benign Assessment & Plan: Low blood pressure possibly due to weight loss and Valium. Switching medication to better manage blood pressure. - Switch from ramipril to lisinopril 5 mg. - Monitor blood pressure at home or at a pharmacy. - Follow up in one month if blood pressure is not well-controlled.  Orders: -     Lisinopril; Take 1 tablet (5 mg total) by mouth daily.  Dispense: 90 tablet; Refill: 3  Mixed hyperlipidemia Assessment & Plan: Having muscle pain potentially related to long-term rosuvastatin use. Reducing dose to assess improvement. - Reduce rosuvastatin dose to 5 mg. - Monitor for improvement in muscle pain.  Orders: -     Rosuvastatin Calcium; Take 1 tablet (5 mg total) by mouth daily.  Dispense: 90 tablet; Refill: 3  Anxiety Panic attacks Assessment & Plan: Improved on Celexa 40 mg and Valium. Genetic testing for medication compatibility deferred until May. Discussed benzo precautions and risks for adverse events in older adults. He understands risks and wants to continue his current regiment. - Continue Celexa 40 mg daily. - Continue Valium as needed, 30 tablets with two refills. - Consider genetic testing for medication compatibility after insurance changes in May.  Orders: -     diazePAM; Take 0.5-1 tablets (1-2 mg total) by mouth daily as needed for anxiety.  Dispense: 30 tablet; Refill: 2 -     Citalopram Hydrobromide; Take 1 tablet (40 mg total) by mouth daily.  Dispense: 90 tablet; Refill:  3   Chronic obstructive pulmonary disease with acute exacerbation (HCC) Assessment & Plan: Requesting refills.  Orders: -     Albuterol Sulfate HFA; Inhale 2 puffs into the lungs every 6 (six) hours as needed for shortness of breath.  Dispense: 18 g; Refill: 1  Cigarette nicotine dependence  without complication Assessment & Plan: Using nicotine patches with some lapses. Plans to taper down from 21 mg patches. Total time spent in smoking cessation counseling and tx plan development: 10 minutes. - Continue 21 mg nicotine patches for two more weeks. - Prescribe 14 mg and 7 mg nicotine patches for tapering.  Orders: -     Nicotine; Place 1 patch (14 mg total) onto the skin daily for 28 days.  Dispense: 28 patch; Refill: 0 -     Nicotine; Place 1 patch (7 mg total) onto the skin daily for 28 days.  Dispense: 28 patch; Refill: 0  History of SIADH Hyponatremia Assessment & Plan: Hyponatremia w/u c/f Syndrome of Inappropriate Antidiuretic Hormone Secretion (SIADH) pattern.  Likely related to Celexa, causing hyponatremia. Advised to reduce fluid intake. - Reduce fluid intake, specifically tea, to two glasses per day.   Encounter for behavioral health screening As part of their intake evaluation, the patient was screened for depression, anxiety.  PHQ9 SCORE 5, GAD7 SCORE 0. Screening results negative for tested conditions. See plan under problem/diagnosis above.   Follow up plan: Return in about 3 months (around 03/07/2024) for Chronic illness f/u, Mood.  Jackolyn Confer, MD

## 2023-12-06 NOTE — Patient Instructions (Signed)
 Switching to lisinopril 5mg  daily for blood pressure   Measure your blood pressure at home! Home Blood Pressure Monitoring is an important part of managing blood pressure and thought to be more accurate than the measures we get in the clinic.  Here's some tips on how to take your blood pressure accurately at home and some highly rated monitors. Most insurances (except for Medicaid) won't pay for monitors, so unfortunately they are an out-of-pocket expense for most people.  Taking an accurate blood pressure measurement: To get an accurate blood pressure reading, empty your bladder first, then rest in a seated position for at least 5 minutes. Ideally, no caffeine or tobacco use in last 30 minutes. Use an arm cuff (not wrist - see recommendations below) seated in a chair with a back next to a table or object that is high enough that you can rest your arm so the blood pressure cuff is at the level of your heart and you can lean back comfortably. Keep both feet on the floor and don't talk while the machine is working. Checking at different times of the day can be helpful to get an idea of your average numbers. Your goal blood pressure should be <140/90. BP Monitor Ratings: Here are some top rated Blood Pressure Monitors as tested by Consumer Reports (accessed January, 2024): (*BB) Best buy = highly rated with lower price Rating Brand/Model Estimated Cost  86 [employer]ron Platinum BP5450 $79  85 Omron Silver BP5250 (*BB)  $53  84 Omron 10 Series BP7450 $92  83 Omron Evolv BP7000 $110  81 [employer] [id number] $52  80 Omron 3 Series BP7100 $50  78 [employer]Health [id number] $40  76 Up [employer] Up ([employer]) Automatic Upper Arm 48-554 $30  Note: all units listed above are arm cuffs which are more accurate than wrist cuffs. For more information (and the source of the below info-graphic on how to get set up to get an accurate reading) - check out: [employer]be

## 2023-12-13 ENCOUNTER — Telehealth: Payer: Self-pay

## 2023-12-13 NOTE — Telephone Encounter (Signed)
Routing to provider to advise patient.

## 2023-12-13 NOTE — Telephone Encounter (Signed)
   Copied from CRM [id number]. Topic: Clinical - Prescription Issue >> Dec 13, 2023 12:49 PM Geroge Baseman wrote: Reason for CRM: lisinopril (ZESTRIL) 5 MG tablet patient is stating that her medication is not working, she would like to see if she can get back on the medication she was taking previously, she states it is called Ramipril. Call home or cell phone. Call to advise.

## 2023-12-15 ENCOUNTER — Other Ambulatory Visit: Payer: Self-pay | Admitting: Pediatrics

## 2023-12-15 ENCOUNTER — Encounter: Payer: Self-pay | Admitting: Pediatrics

## 2023-12-15 DIAGNOSIS — I10 Essential (primary) hypertension: Secondary | ICD-10-CM

## 2023-12-15 MED ORDER — RAMIPRIL 10 MG PO CAPS: 10.0000 mg | ORAL_CAPSULE | Freq: Every day | ORAL | 3 refills | Status: DC

## 2023-12-15 NOTE — Assessment & Plan Note (Signed)
 Improved on Celexa 40 mg and Valium. Genetic testing for medication compatibility deferred until May. - Continue Celexa 40 mg daily. - Continue Valium as needed, 30 tablets with two refills. - Consider genetic testing for medication compatibility after insurance changes in May.

## 2023-12-15 NOTE — Assessment & Plan Note (Signed)
 Low blood pressure possibly due to weight loss and Valium. Switching medication to better manage blood pressure. - Switch from ramipril to lisinopril 5 mg. - Monitor blood pressure at home or at a pharmacy. - Follow up in one month if blood pressure is not well-controlled.

## 2023-12-15 NOTE — Telephone Encounter (Signed)
 Called and notified patient of Dr. Carie Caddy message.

## 2023-12-15 NOTE — Assessment & Plan Note (Addendum)
 Hyponatremia w/u c/f Syndrome of Inappropriate Antidiuretic Hormone Secretion (SIADH) pattern.  Likely related to Celexa, causing hyponatremia. Advised to reduce fluid intake. Does have underlying COPD? Lung ca screen. - Reduce fluid intake, specifically tea, to two glasses per day.

## 2023-12-15 NOTE — Assessment & Plan Note (Addendum)
 Using nicotine patches with some lapses. Plans to taper down from 21 mg patches. Total time spent in smoking cessation counseling and tx plan development: 10 minutes. - Continue 21 mg nicotine patches for two more weeks. - Prescribe 14 mg and 7 mg nicotine patches for tapering.

## 2023-12-15 NOTE — Assessment & Plan Note (Signed)
 Requesting refills

## 2023-12-15 NOTE — Progress Notes (Signed)
 Sent prior dose of lampril 10mg . Not tolerating lisinopril.   Jackolyn Confer, MD

## 2023-12-15 NOTE — Assessment & Plan Note (Signed)
 Having muscle pain potentially related to long-term rosuvastatin use. Reducing dose to assess improvement. - Reduce rosuvastatin dose to 5 mg. - Monitor for improvement in muscle pain.

## 2023-12-15 NOTE — Telephone Encounter (Signed)
 Called and spoke to patient. She states that the lisinopril has been making her feel very tired and lightheaded. States she has not been taking her BP at home but is going to try to get a machine. States she switched back to the ramipril and only has one tablet left.

## 2024-01-19 ENCOUNTER — Ambulatory Visit: Payer: Self-pay

## 2024-01-19 ENCOUNTER — Telehealth: Admitting: Nurse Practitioner

## 2024-01-19 DIAGNOSIS — F17210 Nicotine dependence, cigarettes, uncomplicated: Secondary | ICD-10-CM | POA: Diagnosis not present

## 2024-01-19 DIAGNOSIS — J441 Chronic obstructive pulmonary disease with (acute) exacerbation: Secondary | ICD-10-CM | POA: Diagnosis not present

## 2024-01-19 DIAGNOSIS — J438 Other emphysema: Secondary | ICD-10-CM

## 2024-01-19 MED ORDER — TRELEGY ELLIPTA 100-62.5-25 MCG/ACT IN AEPB: 1.0000 | INHALATION_SPRAY | Freq: Every day | RESPIRATORY_TRACT | 2 refills | Status: DC

## 2024-01-19 MED ORDER — PREDNISONE 50 MG PO TABS: 50.0000 mg | ORAL_TABLET | Freq: Every day | ORAL | 0 refills | Status: DC

## 2024-01-19 MED ORDER — ALBUTEROL SULFATE HFA 108 (90 BASE) MCG/ACT IN AERS: 2.0000 | INHALATION_SPRAY | Freq: Four times a day (QID) | RESPIRATORY_TRACT | 1 refills | Status: DC | PRN

## 2024-01-19 NOTE — Telephone Encounter (Signed)
  Chief Complaint: SOB Symptoms: wheezing Frequency: yesterday Pertinent Negatives: Patient denies fever Disposition: [] ED /[] Urgent Care (no appt availability in office) / [x] Appointment(In office/virtual)/ []  [facility]/ [] Home Care/ [] Refused Recommended Disposition /[] [facility]/ []  Follow-up with PCP Additional Notes: assisted pt with MyChart sign on and e check in Copied from CRM [id number]. Topic: Clinical - Red Word Triage >> Jan 19, 2024  8:24 AM Marissa P wrote: Red Word that prompted transfer to Nurse Triage: Patient called in says she is experiencing some wheezing, short of breath here and there. Mucus, congestion and thinks maybe its the pollen but would like some new prescribed meds or something if possible. Reason for Disposition  Wheezing can be heard across the room  Answer Assessment - Initial Assessment Questions 1. RESPIRATORY STATUS: "Describe your breathing?" (e.g., wheezing, shortness of breath, unable to speak, severe coughing)      SOB, coughing  4. SEVERITY: "How bad is your breathing?" (e.g., mild, moderate, severe)    - MILD: No SOB at rest, mild SOB with walking, speaks normally in sentences, can lie down, no retractions, pulse < 100.    - MODERATE: SOB at rest, SOB with minimal exertion and prefers to sit, cannot lie down flat, speaks in phrases, mild retractions, audible wheezing, pulse 100-120.    - SEVERE: Very SOB at rest, speaks in single words, struggling to breathe, sitting hunched forward, retractions, pulse > 120      moderate 5. RECURRENT SYMPTOM: "Have you had difficulty breathing before?" If Yes, ask: "When was the last time?" and "What happened that time?"      yes  7. LUNG HISTORY: "Do you have any history of lung disease?"  (e.g., pulmonary embolus, asthma, emphysema)     COPD 8. CAUSE: "What do you think is causing the breathing problem?"      allergies 9. OTHER SYMPTOMS: "Do you have any other symptoms? (e.g.,  dizziness, runny nose, cough, chest pain, fever)     Cough, nasal congestion  Protocols used: Breathing Difficulty-A-AH

## 2024-01-19 NOTE — Progress Notes (Signed)
 There were no vitals taken for this visit.   Subjective:    Patient ID: Christine Sparks, female    DOB: [birth year], 63 y.o.   MRN: [id number]  HPI: Christine Sparks is a 63 y.o. female  Chief Complaint  Patient presents with   Allergies    SOB yesterday believes related to allergies. Sinus congestion and some coughing.    UPPER RESPIRATORY TRACT INFECTION Worst symptom: Has been outside recently and feels like her airway is closing and nasal congestion. Using her Trellegy and Nebulizer BID. Fever: no Cough: yes Shortness of breath: yes Wheezing: yes Chest pain: no Chest tightness: yes Chest congestion: yes Nasal congestion: yes Runny nose: yes Post nasal drip: yes Sneezing: no Sore throat: no Swollen glands: no Sinus pressure: no Headache: yes Face pain: no Toothache: no Ear pain: no bilateral Ear pressure: no bilateral Eyes red/itching:no Eye drainage/crusting: yes Vomiting: no Rash: no Fatigue: yes Sick contacts: no Strep contacts: no  Context: worse Recurrent sinusitis: no Relief with OTC cold/cough medications: no  Treatments attempted: none   Relevant past medical, surgical, family and social history reviewed and updated as indicated. Interim medical history since our last visit reviewed. Allergies and medications reviewed and updated.  Review of Systems  Constitutional:  Positive for fatigue. Negative for fever.  HENT:  Positive for congestion, postnasal drip and rhinorrhea. Negative for dental problem, ear pain, sinus pressure, sinus pain, sneezing and sore throat.   Respiratory:  Positive for cough, chest tightness, shortness of breath and wheezing.   Cardiovascular:  Negative for chest pain.  Gastrointestinal:  Negative for vomiting.  Skin:  Negative for rash.  Neurological:  Positive for headaches.    Per HPI unless specifically indicated above     Objective:    There were no vitals taken for this visit.  Wt Readings from Last 3 Encounters:   12/06/23 117 lb 6.4 oz (53.3 kg)  11/17/23 118 lb (53.5 kg)  11/10/23 115 lb 9.6 oz (52.4 kg)    Physical Exam Vitals and nursing note reviewed.  HENT:     Head: Normocephalic.     Right Ear: Hearing normal.     Left Ear: Hearing normal.     Nose: Nose normal.  Eyes:     Pupils: Pupils are equal, round, and reactive to light.  Pulmonary:     Effort: Pulmonary effort is normal. No respiratory distress.  Neurological:     Mental Status: She is alert.  Psychiatric:        Mood and Affect: Mood normal.        Behavior: Behavior normal.        Thought Content: Thought content normal.        Judgment: Judgment normal.     Results for orders placed or performed in visit on 11/17/23  Comprehensive metabolic panel   Collection Time: 11/17/23  2:18 PM  Result Value Ref Range   Glucose 83 70 - 99 mg/dL   BUN 7 (L) 8 - 27 mg/dL   Creatinine, Ser 4.09 0.57 - 1.00 mg/dL   eGFR 811 >91 YN/WGN/5.62   BUN/Creatinine Ratio 12 12 - 28   Sodium 132 (L) 134 - 144 mmol/L   Potassium 4.7 3.5 - 5.2 mmol/L   Chloride 94 (L) 96 - 106 mmol/L   CO2 22 20 - 29 mmol/L   Calcium  9.2 8.7 - 10.3 mg/dL   Total Protein 5.7 (L) 6.0 - 8.5 g/dL   Albumin 3.8 (L) 3.9 -  4.9 g/dL   Globulin, Total 1.9 1.5 - 4.5 g/dL   Bilirubin Total 0.3 0.0 - 1.2 mg/dL   Alkaline Phosphatase 103 44 - 121 IU/L   AST 24 0 - 40 IU/L   ALT 20 0 - 32 IU/L  Creatinine, urine, random   Collection Time: 11/17/23  2:18 PM  Result Value Ref Range   Creatinine, Urine 123.7 Not Estab. mg/dL  Osmolality   Collection Time: 11/17/23  2:18 PM  Result Value Ref Range   Osmolality Meas 268 (L) 280 - 301 mOsmol/kg  Osmolality, urine   Collection Time: 11/17/23  2:18 PM  Result Value Ref Range   Osmolality, Ur 420 mOsmol/kg  Sodium, urine, random   Collection Time: 11/17/23  2:18 PM  Result Value Ref Range   Sodium, Ur 50 Not Estab. mmol/L  Thyroid  Panel With TSH   Collection Time: 11/17/23  2:18 PM  Result Value Ref Range    TSH 0.983 0.450 - 4.500 uIU/mL   T4, Total 5.9 4.5 - 12.0 ug/dL   T3 Uptake Ratio 29 24 - 39 %   Free Thyroxine Index 1.7 1.2 - 4.9      Assessment & Plan:   Problem List Items Addressed This Visit       Respiratory   COPD (chronic obstructive pulmonary disease) (HCC)   Relevant Medications   albuterol  (VENTOLIN  HFA) 108 (90 Base) MCG/ACT inhaler   TRELEGY ELLIPTA  100-62.5-25 MCG/ACT AEPB   predniSONE  (DELTASONE ) 50 MG tablet   COPD exacerbation (HCC) - Primary   Exacerbated at this time.  Encouraged patient to increase use of Duoneb.  Educated patient on proper use of inhalers and Duo neb.  Will treat with prednisone  50mg  x 5 days.  Follow up next week in person if symptoms are not improved.       Relevant Medications   albuterol  (VENTOLIN  HFA) 108 (90 Base) MCG/ACT inhaler   TRELEGY ELLIPTA  100-62.5-25 MCG/ACT AEPB   predniSONE  (DELTASONE ) 50 MG tablet     Follow up plan: No follow-ups on file.   This visit was completed via MyChart due to the restrictions of the COVID-19 pandemic. All issues as above were discussed and addressed. Physical exam was done as above through visual confirmation on MyChart. If it was felt that the patient should be evaluated in the office, they were directed there. The patient verbally consented to this visit. Location of the patient: Home Location of the provider: Office Those involved with this call:  Provider: Aileen Alexanders, NP CMA: Althia Jetty, CMA Front Desk/Registration: Jaynee Meyer This encounter was conducted via video.  I spent 20 mins dedicated to the care of this patient on the date of this encounter to include previsit review of symptoms, plan of care and follow up, face to face time with the patient, and post visit ordering of testing.

## 2024-01-19 NOTE — Assessment & Plan Note (Signed)
 Exacerbated at this time.  Encouraged patient to increase use of Duoneb.  Educated patient on proper use of inhalers and Duo neb.  Will treat with prednisone  50mg  x 5 days.  Follow up next week in person if symptoms are not improved.

## 2024-02-02 ENCOUNTER — Other Ambulatory Visit: Payer: Self-pay | Admitting: Pediatrics

## 2024-02-02 NOTE — Telephone Encounter (Signed)
 Copied from CRM [id number]. Topic: Clinical - Prescription Issue >> Feb 02, 2024  1:32 PM Christine Sparks D wrote: Patient needs a refill for her solution for her nebulizer.  [facility] - [place], [place] - [address] [place] [address] Phone: [phone] Fax: [phone] Hours: Not open 24 hours

## 2024-02-05 ENCOUNTER — Other Ambulatory Visit: Payer: Self-pay | Admitting: Pediatrics

## 2024-02-05 DIAGNOSIS — J441 Chronic obstructive pulmonary disease with (acute) exacerbation: Secondary | ICD-10-CM

## 2024-02-05 NOTE — Telephone Encounter (Signed)
 Copied from CRM [id number]. Topic: Clinical - Medication Refill >> Feb 05, 2024 10:35 AM Corin V wrote: Most Recent Primary Care Visit:  Provider: Aileen Alexanders  Department: [facility]  Visit Type: ACUTE  Date: 01/19/2024  Medication: albuterol  (VENTOLIN  HFA) 108 (90 Base) MCG/ACT inhaler- patient called in 5/2 but message was entered as prescription issue, not refill request  Has the patient contacted their pharmacy? Yes (Agent: If no, request that the patient contact the pharmacy for the refill. If patient does not wish to contact the pharmacy document the reason why and proceed with request.) (Agent: If yes, when and what did the pharmacy advise?)  Is this the correct pharmacy for this prescription? Yes If no, delete pharmacy and type the correct one.  This is the patient's preferred pharmacy:  [facility] - [place], [place] - [address] [place] [address] Phone: [phone] Fax: [phone]   Has the prescription been filled recently? No  Is the patient out of the medication? Yes- declined to speak with NT  Has the patient been seen for an appointment in the last year OR does the patient have an upcoming appointment? Yes  Can we respond through MyChart? Yes  Agent: Please be advised that Rx refills may take up to 3 business days. We ask that you follow-up with your pharmacy.

## 2024-02-05 NOTE — Telephone Encounter (Unsigned)
 Copied from CRM [id number]. Topic: Clinical - Prescription Issue >> Feb 05, 2024  2:12 PM Kevelyn M wrote: Reason for CRM: Patient called in asking about her refill. I informed her that the pharmacy received the 2 refills on 4/18. She then informed me that she lost her box and she used both refills.

## 2024-02-06 MED ORDER — ALBUTEROL SULFATE HFA 108 (90 BASE) MCG/ACT IN AERS: 2.0000 | INHALATION_SPRAY | Freq: Four times a day (QID) | RESPIRATORY_TRACT | 1 refills | Status: DC | PRN

## 2024-02-06 NOTE — Telephone Encounter (Signed)
 Requested medication (s) are due for refill today: yes  Requested medication (s) are on the active medication list: yes  Last refill:  01/10/23  Future visit scheduled: yes  Notes to clinic:  Unable to refill per protocol, last refill by another provider. Routing to PCP for approval.     Requested Prescriptions  Pending Prescriptions Disp Refills   ipratropium-albuterol  (DUONEB) 0.5-2.5 (3) MG/3ML SOLN 360 mL 3    Sig: Take 3 mLs by nebulization every 6 (six) hours as needed.     Pulmonology:  Combination Products - albuterol  / ipratropium Passed - 02/06/2024  8:24 AM      Passed - Last BP in normal range    BP Readings from Last 1 Encounters:  12/06/23 (!) 94/58         Passed - Last Heart Rate in normal range    Pulse Readings from Last 1 Encounters:  12/06/23 81         Passed - Valid encounter within last 12 months    Recent Outpatient Visits           2 weeks ago COPD exacerbation (HCC)   [place] [facility] Aileen Alexanders, NP   2 months ago Essential hypertension, benign   [place] [facility] Hadassah Letters, MD   2 months ago Panic attacks   [facility] Hadassah Letters, MD   2 months ago Chronic obstructive pulmonary disease with acute exacerbation [facility])   [place] [facility] Hadassah Letters, MD       Future Appointments             In 1 month Juliette Oh, Stephannie Ehlers, MD [place] [facility]

## 2024-02-06 NOTE — Telephone Encounter (Signed)
Pt called to check on status of refill  

## 2024-02-07 ENCOUNTER — Ambulatory Visit: Payer: Self-pay

## 2024-02-07 ENCOUNTER — Telehealth: Payer: Self-pay | Admitting: Pediatrics

## 2024-02-07 MED ORDER — IPRATROPIUM-ALBUTEROL 0.5-2.5 (3) MG/3ML IN SOLN: 3.0000 mL | Freq: Four times a day (QID) | RESPIRATORY_TRACT | 3 refills | Status: AC | PRN

## 2024-02-07 NOTE — Telephone Encounter (Signed)
 Copied from CRM [id number]. Topic: Clinical - Medication Refill >> Feb 07, 2024 11:11 AM Ivette P wrote: Medication: ipratropium-albuterol  (DUONEB) 0.5-2.5 (3) MG/3ML SOLN  Has the patient contacted their pharmacy? Yes (Agent: If no, request that the patient contact the pharmacy for the refill. If patient does not wish to contact the pharmacy document the reason why and proceed with request.) (Agent: If yes, when and what did the pharmacy advise?)  This is the patient's preferred pharmacy:  [facility] - [place], [place] - [address] [place] [address] Phone: [phone] Fax: [phone]  Is this the correct pharmacy for this prescription? Yes If no, delete pharmacy and type the correct one.   Has the prescription been filled recently? No, 01/10/2023  Is the patient out of the medication? Yes  Has the patient been seen for an appointment in the last year OR does the patient have an upcoming appointment? Yes  Can we respond through MyChart? Yes  Agent: Please be advised that Rx refills may take up to 3 business days. We ask that you follow-up with your pharmacy.

## 2024-02-07 NOTE — Telephone Encounter (Signed)
 Chief Complaint: Shortness of breath  Symptoms: SOB due to allergies and COPD. Patient states SOB occurs when she goes outside Frequency: going on for the last couple of months.  Pertinent Negatives: Patient denies fever, CP Disposition: [] ED /[] Urgent Care (no appt availability in office) / [] Appointment(In office/virtual)/ []  [facility]/ [] Home Care/ [] Refused Recommended Disposition /[] [facility]/ [x]  Follow-up with PCP Additional Notes: patient calling with concerns for shortness of breath due to allergies. Patient does have a history of COPD but patient states SOB happens when she goes outside. Patient has sent in prescription refill request for her duoneb solution. Initial order for Duoneb was written by another provider in 2024 so [facility] is unable to send to pharmacy. Patient is needing this sent as she is unable to get her albuterol  inhaler until this coming Monday due to insurance. Patient is asking for a phone call when medication is sent to pharmacy. Patient verbalized understanding and all questions answered.    Copied from CRM [id number]. Topic: Clinical - Red Word Triage >> Feb 07, 2024 10:55 AM Ethelle Herb L wrote: Red Word that prompted transfer to Nurse Triage: SOB due to allergies, pt unable to get nebulizing solution. Has called multiple times Reason for Disposition  [1] MILD difficulty breathing (e.g., minimal/no SOB at rest, SOB with walking, pulse <100) AND [2] NEW-onset or WORSE than normal  Answer Assessment - Initial Assessment Questions 1. RESPIRATORY STATUS: "Describe your breathing?" (e.g., wheezing, shortness of breath, unable to speak, severe coughing)      Shortness of breath 2. ONSET: "When did this breathing problem begin?"      Been going on for quite awhile with the start of spring 3. PATTERN "Does the difficult breathing come and go, or has it been constant since it started?"      intermittent 4. SEVERITY: "How bad is your breathing?" (e.g.,  mild, moderate, severe)    - MILD: No SOB at rest, mild SOB with walking, speaks normally in sentences, can lie down, no retractions, pulse < 100.    - MODERATE: SOB at rest, SOB with minimal exertion and prefers to sit, cannot lie down flat, speaks in phrases, mild retractions, audible wheezing, pulse 100-120.    - SEVERE: Very SOB at rest, speaks in single words, struggling to breathe, sitting hunched forward, retractions, pulse > 120      Mild when occurs 5. RECURRENT SYMPTOM: "Have you had difficulty breathing before?" If Yes, ask: "When was the last time?" and "What happened that time?"      Yes-hx of COPD  6. CARDIAC HISTORY: "Do you have any history of heart disease?" (e.g., heart attack, angina, bypass surgery, angioplasty)      no 7. LUNG HISTORY: "Do you have any history of lung disease?"  (e.g., pulmonary embolus, asthma, emphysema)     COPD 8. CAUSE: "What do you think is causing the breathing problem?"      Allergies and COPD 9. OTHER SYMPTOMS: "Do you have any other symptoms? (e.g., dizziness, runny nose, cough, chest pain, fever)     No 10. O2 SATURATION MONITOR:  "Do you use an oxygen saturation monitor (pulse oximeter) at home?" If Yes, ask: "What is your reading (oxygen level) today?" "What is your usual oxygen saturation reading?" (e.g., 95%)       No 12. TRAVEL: "Have you traveled out of the country in the last month?" (e.g., travel history, exposures)       no  Protocols used: Breathing Difficulty-A-AH

## 2024-02-07 NOTE — Telephone Encounter (Signed)
 Copied from CRM [id number]. Topic: Clinical - Medication Refill >> Feb 07, 2024 11:16 AM Ivette P wrote: Medication: ipratropium-albuterol  (DUONEB) 0.5-2.5 (3) MG/3ML SOLN  Has the patient contacted their pharmacy? Yes (Agent: If no, request that the patient contact the pharmacy for the refill. If patient does not wish to contact the pharmacy document the reason why and proceed with request.) (Agent: If yes, when and what did the pharmacy advise?)  This is the patient's preferred pharmacy:  [facility] - [place], [place] - [address] [place] [address] Phone: [phone] Fax: [phone]  Is this the correct pharmacy for this prescription? Yes If no, delete pharmacy and type the correct one.   Has the prescription been filled recently? Yes  Is the patient out of the medication? Yes  Has the patient been seen for an appointment in the last year OR does the patient have an upcoming appointment? Yes  Can we respond through MyChart? Yes  Agent: Please be advised that Rx refills may take up to 3 business days. We ask that you follow-up with your pharmacy.

## 2024-02-08 NOTE — Telephone Encounter (Signed)
 Attempted to reach pt no answer will try again at a later time

## 2024-02-12 NOTE — Telephone Encounter (Signed)
 Attempted to reach patient again no answer, will close encounter

## 2024-03-04 ENCOUNTER — Other Ambulatory Visit: Payer: Self-pay

## 2024-03-04 ENCOUNTER — Other Ambulatory Visit: Payer: Self-pay | Admitting: Pediatrics

## 2024-03-04 DIAGNOSIS — F419 Anxiety disorder, unspecified: Secondary | ICD-10-CM

## 2024-03-04 DIAGNOSIS — F410 Panic disorder [episodic paroxysmal anxiety] without agoraphobia: Secondary | ICD-10-CM

## 2024-03-04 NOTE — Telephone Encounter (Signed)
 Copied from CRM [id number]. Topic: Clinical - Prescription Issue >> Mar 04, 2024 11:12 AM [facility] [place] wrote: Reason for CRM: diazepam  (VALIUM ) 2 MG tablet [Pharmacy Med- checking on refill status >> Mar 04, 2024  1:04 PM Bridgette Campus T wrote: Calling to follow up on refill

## 2024-03-05 MED ORDER — DIAZEPAM 2 MG PO TABS
1.0000 mg | ORAL_TABLET | Freq: Every day | ORAL | 0 refills | Status: DC | PRN
Start: 2024-03-05 — End: 2024-03-13

## 2024-03-05 NOTE — Telephone Encounter (Signed)
 Copied from CRM [id number]. Topic: Clinical - Prescription Issue >> Mar 04, 2024 11:12 AM Star [place] wrote: Reason for CRM: diazepam  (VALIUM ) 2 MG tablet [Pharmacy Med- checking on refill status >> Mar 04, 2024  1:04 PM Bridgette Campus T wrote: Calling to follow up on refill  Patient checking on refill. States she will be out today. Requesting enough until her appt on 03-13-24. Also requesting phone call when sent. [phone]

## 2024-03-05 NOTE — Telephone Encounter (Signed)
 Duplicate request, refilled 03/05/24.  Requested Prescriptions  Pending Prescriptions Disp Refills   diazepam  (VALIUM ) 2 MG tablet [Pharmacy Med Name: DIAZEPAM  2 MG TAB] 30 tablet     Sig: TAKE 1/2-1 TABLET BY MOUTH ONCE DAILY ASNEEDED FOR ANXIETY     Not Delegated - Psychiatry: Anxiolytics/Hypnotics 2 Failed - 03/05/2024 12:33 PM      Failed - This refill cannot be delegated      Failed - Urine Drug Screen completed in last 360 days      Passed - Patient is not pregnant      Passed - Valid encounter within last 6 months    Recent Outpatient Visits           1 month ago COPD exacerbation (HCC)   [place] [facility] Aileen Alexanders, NP   3 months ago Essential hypertension, benign   [place] [facility] Hadassah Letters, MD   3 months ago Panic attacks   [place] [facility] Hadassah Letters, MD   3 months ago Chronic obstructive pulmonary disease with acute exacerbation [facility])   [place] [facility] Hadassah Letters, MD       Future Appointments             In 1 week Juliette Oh, Stephannie Ehlers, MD [place] [facility]

## 2024-03-13 ENCOUNTER — Encounter: Payer: Self-pay | Admitting: Pediatrics

## 2024-03-13 ENCOUNTER — Ambulatory Visit: Admitting: Pediatrics

## 2024-03-13 VITALS — BP 119/76 | HR 74 | Temp 97.9°F | Wt 116.6 lb

## 2024-03-13 DIAGNOSIS — I10 Essential (primary) hypertension: Secondary | ICD-10-CM | POA: Diagnosis not present

## 2024-03-13 DIAGNOSIS — Z1330 Encounter for screening examination for mental health and behavioral disorders, unspecified: Secondary | ICD-10-CM | POA: Diagnosis not present

## 2024-03-13 DIAGNOSIS — F410 Panic disorder [episodic paroxysmal anxiety] without agoraphobia: Secondary | ICD-10-CM | POA: Diagnosis not present

## 2024-03-13 DIAGNOSIS — J441 Chronic obstructive pulmonary disease with (acute) exacerbation: Secondary | ICD-10-CM

## 2024-03-13 DIAGNOSIS — F321 Major depressive disorder, single episode, moderate: Secondary | ICD-10-CM

## 2024-03-13 DIAGNOSIS — F419 Anxiety disorder, unspecified: Secondary | ICD-10-CM | POA: Diagnosis not present

## 2024-03-13 MED ORDER — LORAZEPAM 0.5 MG PO TABS: 0.5000 mg | ORAL_TABLET | Freq: Every day | ORAL | 1 refills | Status: DC | PRN

## 2024-03-13 MED ORDER — DOXYCYCLINE HYCLATE 100 MG PO TABS: 100.0000 mg | ORAL_TABLET | Freq: Two times a day (BID) | ORAL | 0 refills | Status: AC

## 2024-03-13 MED ORDER — PREDNISONE 20 MG PO TABS: 40.0000 mg | ORAL_TABLET | Freq: Every day | ORAL | 0 refills | Status: AC

## 2024-03-13 NOTE — Patient Instructions (Signed)
 Start prednisone  40mg  and doxycycline  100mg  twice daily for 5 days total

## 2024-03-13 NOTE — Progress Notes (Signed)
 Office Visit  BP 119/76   Pulse 74   Temp 97.9 F (36.6 C) (Oral)   Wt 116 lb 9.6 oz (52.9 kg)   SpO2 98%   BMI 20.65 kg/m    Subjective:    Patient ID: Christine Sparks, female    DOB: May 03, 1961, 63 y.o.   MRN: [id number]  HPI: Christine Sparks is a 63 y.o. female  Chief Complaint  Patient presents with   COPD    Discussed the use of AI scribe software for clinical note transcription with the patient, who gave verbal consent to proceed.  History of Present Illness   Christine Sparks is a 63 year old female with COPD who presents with increased coughing and wheezing.  She has experienced increased coughing and wheezing for more than a day, exacerbated by physical activity such as setting out her garbage. She uses Trelegy and albuterol  for COPD management. Recently, she was prescribed prednisone  for five days due to allergies and high pollen levels, but did not receive Duo Nebs as expected. Despite this treatment, her symptoms have not improved significantly, and she continues to experience wheezing and soreness.  In addition to her respiratory issues, she discusses her medication regimen for anxiety. She is currently taking Valium  but reports drowsiness and balance issues as the day progresses. She previously used Ativan  and Xanax, and currently takes a low dose of Ativan , typically half in the morning.  She is also on Celexa  for mental health and Ramipril  for blood pressure management, both of which she reports no issues with.  She works at [employer] and deals with stress related to managing bills and using technology, preferring in-person interactions over automated systems.      Relevant past medical, surgical, family and social history reviewed and updated as indicated. Interim medical history since our last visit reviewed. Allergies and medications reviewed and updated.  ROS per HPI unless specifically indicated above     Objective:    BP 119/76   Pulse 74   Temp  97.9 F (36.6 C) (Oral)   Wt 116 lb 9.6 oz (52.9 kg)   SpO2 98%   BMI 20.65 kg/m   Wt Readings from Last 3 Encounters:  03/13/24 116 lb 9.6 oz (52.9 kg)  12/06/23 117 lb 6.4 oz (53.3 kg)  11/17/23 118 lb (53.5 kg)     Physical Exam Constitutional:      Appearance: Normal appearance.  Pulmonary:     Effort: Pulmonary effort is normal.     Breath sounds: Wheezing present. No rales.     Comments: Bilateral inspiratory and expiratory wheezing in upper lung fields  Musculoskeletal:        General: Normal range of motion.   Skin:    Comments: Normal skin color   Neurological:     General: No focal deficit present.     Mental Status: She is alert. Mental status is at baseline.   Psychiatric:        Mood and Affect: Mood normal.        Behavior: Behavior normal.        Thought Content: Thought content normal.         03/13/2024    1:36 PM 01/19/2024    8:56 AM 12/06/2023    1:16 PM 11/10/2023    3:08 PM  Depression screen PHQ 2/9  Decreased Interest 1 0 1 2  Down, Depressed, Hopeless 1 0 0 2  PHQ - 2 Score 2 0  1 4  Altered sleeping 2  1 3   Tired, decreased energy 2  1 3   Change in appetite 2  1 3   Feeling bad or failure about yourself  2  0 1  Trouble concentrating 0  0 3  Moving slowly or fidgety/restless 0  0 0  Suicidal thoughts 0  0 0  PHQ-9 Score 10  4 17   Difficult doing work/chores   Somewhat difficult        03/13/2024    1:36 PM 12/06/2023    1:16 PM 11/10/2023    3:08 PM  GAD 7 : Generalized Anxiety Score  Nervous, Anxious, on Edge 1  3  Control/stop worrying 1 0 3  Worry too much - different things 1 0 2  Trouble relaxing 1 1 1   Restless 0 0 0  Easily annoyed or irritable 1 0 1  Afraid - awful might happen 1 0 0  Total GAD 7 Score 6  10  Anxiety Difficulty  Somewhat difficult        Assessment & Plan:  Assessment & Plan   Chronic obstructive pulmonary disease with acute exacerbation (HCC) Assessment & Plan: Experiencing exacerbation with  increased coughing, wheezing, and dyspnea. Inhaler regimen may need adjustment post-exacerbation. - Prescribe prednisone  for 5 days. - Prescribe doxycycline . - Re-evaluate inhaler regimen after exacerbation resolves. - Schedule follow-up appointment in 2 weeks.  Orders: -     Doxycycline  Hyclate; Take 1 tablet (100 mg total) by mouth 2 (two) times daily for 5 days.  Dispense: 10 tablet; Refill: 0 -     predniSONE ; Take 2 tablets (40 mg total) by mouth daily with breakfast for 5 days.  Dispense: 10 tablet; Refill: 0  Anxiety Panic attacks Assessment & Plan: Increased drowsiness and balance issues with Valium . Plans to switch to Ativan  for better safety and efficacy. Discussed benzo precautions and risks for adverse events in older adults. He understands risks and wants to continue his current regiment. - Discontinue Valium . - Prescribe Ativan  as needed for anxiety. - Provide 30 tablets of Ativan  for initial trial. - Evaluate effectiveness and adjust dosage as needed at follow-up.  Orders: -     LORazepam ; Take 1 tablet (0.5 mg total) by mouth daily as needed for anxiety.  Dispense: 30 tablet; Refill: 1   Essential hypertension, benign Assessment & Plan: Blood pressure well-controlled on ramipril .   Depression, major, single episode, moderate (HCC) Assessment & Plan: Celexa  effective with no issues reported. - Continue Celexa .    Encounter for behavioral health screening As part of their intake evaluation, the patient was screened for depression, anxiety.  PHQ9 SCORE 10, GAD7 SCORE 6. Screening results positive for tested conditions. See plan under problem/diagnosis above.     Follow up plan: Return in about 2 weeks (around 03/27/2024) for COPD, Mood.  Hadassah Letters, MD  Today's visit encompasses complex care management of above conditions as part of ongoing care as primary care doctor and longitudinal relationship.

## 2024-03-19 ENCOUNTER — Encounter: Payer: Self-pay | Admitting: Pediatrics

## 2024-03-19 NOTE — Assessment & Plan Note (Signed)
 Experiencing exacerbation with increased coughing, wheezing, and dyspnea. Inhaler regimen may need adjustment post-exacerbation. - Prescribe prednisone  for 5 days. - Prescribe doxycycline . - Re-evaluate inhaler regimen after exacerbation resolves. - Schedule follow-up appointment in 2 weeks.

## 2024-03-19 NOTE — Assessment & Plan Note (Signed)
 Celexa  effective with no issues reported. - Continue Celexa .

## 2024-03-19 NOTE — Assessment & Plan Note (Signed)
 Increased drowsiness and balance issues with Valium . Plans to switch to Ativan  for better safety and efficacy. Discussed benzo precautions and risks for adverse events in older adults. He understands risks and wants to continue his current regiment. - Discontinue Valium . - Prescribe Ativan  as needed for anxiety. - Provide 30 tablets of Ativan  for initial trial. - Evaluate effectiveness and adjust dosage as needed at follow-up.

## 2024-03-19 NOTE — Assessment & Plan Note (Signed)
 Blood pressure well-controlled on ramipril.

## 2024-03-26 ENCOUNTER — Emergency Department

## 2024-03-26 ENCOUNTER — Other Ambulatory Visit: Payer: Self-pay

## 2024-03-26 ENCOUNTER — Inpatient Hospital Stay
Admission: EM | Admit: 2024-03-26 | Discharge: 2024-03-28 | DRG: 643 | Disposition: A | Discharge status: Home / Self Care | Attending: Student in an Organized Health Care Education/Training Program | Admitting: Student in an Organized Health Care Education/Training Program

## 2024-03-26 DIAGNOSIS — J441 Chronic obstructive pulmonary disease with (acute) exacerbation: Principal | ICD-10-CM

## 2024-03-26 DIAGNOSIS — J449 Chronic obstructive pulmonary disease, unspecified: Secondary | ICD-10-CM | POA: Diagnosis present

## 2024-03-26 DIAGNOSIS — Z720 Tobacco use: Secondary | ICD-10-CM | POA: Diagnosis present

## 2024-03-26 DIAGNOSIS — Z79899 Other long term (current) drug therapy: Secondary | ICD-10-CM

## 2024-03-26 DIAGNOSIS — F10231 Alcohol dependence with withdrawal delirium: Secondary | ICD-10-CM | POA: Diagnosis present

## 2024-03-26 DIAGNOSIS — Z803 Family history of malignant neoplasm of breast: Secondary | ICD-10-CM | POA: Diagnosis not present

## 2024-03-26 DIAGNOSIS — I2489 Other forms of acute ischemic heart disease: Secondary | ICD-10-CM | POA: Diagnosis present

## 2024-03-26 DIAGNOSIS — Z800 Family history of malignant neoplasm of digestive organs: Secondary | ICD-10-CM | POA: Diagnosis not present

## 2024-03-26 DIAGNOSIS — G9341 Metabolic encephalopathy: Secondary | ICD-10-CM | POA: Diagnosis present

## 2024-03-26 DIAGNOSIS — F17210 Nicotine dependence, cigarettes, uncomplicated: Secondary | ICD-10-CM | POA: Diagnosis present

## 2024-03-26 DIAGNOSIS — D72829 Elevated white blood cell count, unspecified: Secondary | ICD-10-CM | POA: Diagnosis not present

## 2024-03-26 DIAGNOSIS — E782 Mixed hyperlipidemia: Secondary | ICD-10-CM | POA: Diagnosis present

## 2024-03-26 DIAGNOSIS — I252 Old myocardial infarction: Secondary | ICD-10-CM | POA: Diagnosis not present

## 2024-03-26 DIAGNOSIS — R112 Nausea with vomiting, unspecified: Secondary | ICD-10-CM

## 2024-03-26 DIAGNOSIS — F10931 Alcohol use, unspecified with withdrawal delirium: Secondary | ICD-10-CM | POA: Diagnosis not present

## 2024-03-26 DIAGNOSIS — E119 Type 2 diabetes mellitus without complications: Secondary | ICD-10-CM | POA: Diagnosis present

## 2024-03-26 DIAGNOSIS — R197 Diarrhea, unspecified: Secondary | ICD-10-CM | POA: Diagnosis present

## 2024-03-26 DIAGNOSIS — E875 Hyperkalemia: Secondary | ICD-10-CM | POA: Diagnosis present

## 2024-03-26 DIAGNOSIS — J9601 Acute respiratory failure with hypoxia: Secondary | ICD-10-CM | POA: Diagnosis present

## 2024-03-26 DIAGNOSIS — I10 Essential (primary) hypertension: Secondary | ICD-10-CM | POA: Diagnosis present

## 2024-03-26 DIAGNOSIS — E222 Syndrome of inappropriate secretion of antidiuretic hormone: Secondary | ICD-10-CM | POA: Diagnosis present

## 2024-03-26 DIAGNOSIS — Z8249 Family history of ischemic heart disease and other diseases of the circulatory system: Secondary | ICD-10-CM | POA: Diagnosis not present

## 2024-03-26 DIAGNOSIS — Z801 Family history of malignant neoplasm of trachea, bronchus and lung: Secondary | ICD-10-CM | POA: Diagnosis not present

## 2024-03-26 DIAGNOSIS — E871 Hypo-osmolality and hyponatremia: Secondary | ICD-10-CM | POA: Diagnosis present

## 2024-03-26 DIAGNOSIS — Z808 Family history of malignant neoplasm of other organs or systems: Secondary | ICD-10-CM | POA: Diagnosis not present

## 2024-03-26 LAB — URINE DRUG SCREEN, QUALITATIVE (ARMC ONLY)
Amphetamines, Ur Screen: NOT DETECTED
Barbiturates, Ur Screen: NOT DETECTED
Benzodiazepine, Ur Scrn: POSITIVE — AB
Cannabinoid 50 Ng, Ur ~~LOC~~: NOT DETECTED
Cocaine Metabolite,Ur ~~LOC~~: NOT DETECTED
MDMA (Ecstasy)Ur Screen: NOT DETECTED
Methadone Scn, Ur: NOT DETECTED
Opiate, Ur Screen: NOT DETECTED
Phencyclidine (PCP) Ur S: NOT DETECTED
Tricyclic, Ur Screen: NOT DETECTED

## 2024-03-26 LAB — CBC WITH DIFFERENTIAL/PLATELET
Abs Immature Granulocytes: 0.05 10*3/uL (ref 0.00–0.07)
Basophils Absolute: 0.1 10*3/uL (ref 0.0–0.1)
Basophils Relative: 0 %
Eosinophils Absolute: 0.1 10*3/uL (ref 0.0–0.5)
Eosinophils Relative: 1 %
HCT: 31.3 % — ABNORMAL LOW (ref 36.0–46.0)
Hemoglobin: 11.6 g/dL — ABNORMAL LOW (ref 12.0–15.0)
Immature Granulocytes: 0 %
Lymphocytes Relative: 7 %
Lymphs Abs: 0.9 10*3/uL (ref 0.7–4.0)
MCH: 33 pg (ref 26.0–34.0)
MCHC: 37.1 g/dL — ABNORMAL HIGH (ref 30.0–36.0)
MCV: 88.9 fL (ref 80.0–100.0)
Monocytes Absolute: 0.7 10*3/uL (ref 0.1–1.0)
Monocytes Relative: 5 %
Neutro Abs: 11.2 10*3/uL — ABNORMAL HIGH (ref 1.7–7.7)
Neutrophils Relative %: 87 %
Platelets: 465 10*3/uL — ABNORMAL HIGH (ref 150–400)
RBC: 3.52 MIL/uL — ABNORMAL LOW (ref 3.87–5.11)
RDW: 13.9 % (ref 11.5–15.5)
WBC: 12.9 10*3/uL — ABNORMAL HIGH (ref 4.0–10.5)
nRBC: 0 % (ref 0.0–0.2)

## 2024-03-26 LAB — GLUCOSE, CAPILLARY: Glucose-Capillary: 160 mg/dL — ABNORMAL HIGH (ref 70–99)

## 2024-03-26 LAB — BASIC METABOLIC PANEL WITH GFR
Anion gap: 11 (ref 5–15)
Anion gap: 9 (ref 5–15)
BUN: 7 mg/dL — ABNORMAL LOW (ref 8–23)
BUN: 7 mg/dL — ABNORMAL LOW (ref 8–23)
CO2: 21 mmol/L — ABNORMAL LOW (ref 22–32)
CO2: 21 mmol/L — ABNORMAL LOW (ref 22–32)
Calcium: 8.6 mg/dL — ABNORMAL LOW (ref 8.9–10.3)
Calcium: 8.3 mg/dL — ABNORMAL LOW (ref 8.9–10.3)
Chloride: 84 mmol/L — ABNORMAL LOW (ref 98–111)
Chloride: 88 mmol/L — ABNORMAL LOW (ref 98–111)
Creatinine, Ser: 0.57 mg/dL (ref 0.44–1.00)
Creatinine, Ser: 0.46 mg/dL (ref 0.44–1.00)
GFR, Estimated: >60 mL/min (ref >60)
GFR, Estimated: >60 mL/min (ref >60)
Glucose, Bld: 149 mg/dL — ABNORMAL HIGH (ref 70–99)
Glucose, Bld: 152 mg/dL — ABNORMAL HIGH (ref 70–99)
Potassium: 4.8 mmol/L (ref 3.5–5.1)
Potassium: 4.7 mmol/L (ref 3.5–5.1)
Sodium: 116 mmol/L — CL (ref 135–145)
Sodium: 118 mmol/L — CL (ref 135–145)

## 2024-03-26 LAB — TROPONIN I (HIGH SENSITIVITY)
Troponin I (High Sensitivity): 10 ng/L (ref <18)
Troponin I (High Sensitivity): 34 ng/L — ABNORMAL HIGH (ref <18)
Troponin I (High Sensitivity): 64 ng/L — ABNORMAL HIGH (ref <18)
Troponin I (High Sensitivity): 76 ng/L — ABNORMAL HIGH (ref <18)

## 2024-03-26 LAB — URINALYSIS, ROUTINE W REFLEX MICROSCOPIC
Bilirubin Urine: NEGATIVE
Glucose, UA: NEGATIVE mg/dL
Ketones, ur: NEGATIVE mg/dL
Leukocytes,Ua: NEGATIVE
Nitrite: NEGATIVE
Protein, ur: NEGATIVE mg/dL
Specific Gravity, Urine: 1.012 (ref 1.005–1.030)
pH: 6 (ref 5.0–8.0)

## 2024-03-26 LAB — BLOOD GAS, ARTERIAL
Acid-base deficit: 0.6 mmol/L (ref 0.0–2.0)
Bicarbonate: 25.4 mmol/L (ref 20.0–28.0)
O2 Saturation: 87.1 %
Patient temperature: 37
pCO2 arterial: 46 mmHg (ref 32–48)
pH, Arterial: 7.35 (ref 7.35–7.45)
pO2, Arterial: 54 mmHg — ABNORMAL LOW (ref 83–108)

## 2024-03-26 LAB — COMPREHENSIVE METABOLIC PANEL WITH GFR
ALT: 32 U/L (ref 0–44)
AST: 30 U/L (ref 15–41)
Albumin: 3.3 g/dL — ABNORMAL LOW (ref 3.5–5.0)
Alkaline Phosphatase: 58 U/L (ref 38–126)
Anion gap: 10 (ref 5–15)
BUN: <5 mg/dL — ABNORMAL LOW (ref 8–23)
CO2: 22 mmol/L (ref 22–32)
Calcium: 8.3 mg/dL — ABNORMAL LOW (ref 8.9–10.3)
Chloride: 83 mmol/L — ABNORMAL LOW (ref 98–111)
Creatinine, Ser: 0.42 mg/dL — ABNORMAL LOW (ref 0.44–1.00)
GFR, Estimated: >60 mL/min (ref >60)
Glucose, Bld: 109 mg/dL — ABNORMAL HIGH (ref 70–99)
Potassium: 4.6 mmol/L (ref 3.5–5.1)
Sodium: 115 mmol/L — CL (ref 135–145)
Total Bilirubin: 0.7 mg/dL (ref 0.0–1.2)
Total Protein: 5.7 g/dL — ABNORMAL LOW (ref 6.5–8.1)

## 2024-03-26 LAB — LIPASE, BLOOD: Lipase: 26 U/L (ref 11–51)

## 2024-03-26 LAB — SODIUM, URINE, RANDOM
Sodium, Ur: 30 mmol/L
Sodium, Ur: 13 mmol/L

## 2024-03-26 LAB — MAGNESIUM: Magnesium: 1.6 mg/dL — ABNORMAL LOW (ref 1.7–2.4)

## 2024-03-26 LAB — RESP PANEL BY RT-PCR (RSV, FLU A&B, COVID)  RVPGX2
Influenza A by PCR: NEGATIVE
Influenza B by PCR: NEGATIVE
Resp Syncytial Virus by PCR: NEGATIVE
SARS Coronavirus 2 by RT PCR: NEGATIVE

## 2024-03-26 LAB — OSMOLALITY, URINE
Osmolality, Ur: 325 mosm/kg (ref 300–900)
Osmolality, Ur: 160 mosm/kg — ABNORMAL LOW (ref 300–900)

## 2024-03-26 LAB — MRSA NEXT GEN BY PCR, NASAL: MRSA by PCR Next Gen: NOT DETECTED

## 2024-03-26 LAB — SODIUM
Sodium: 121 mmol/L — ABNORMAL LOW (ref 135–145)
Sodium: 123 mmol/L — ABNORMAL LOW (ref 135–145)

## 2024-03-26 LAB — OSMOLALITY
Osmolality: 245 mosm/kg — CL (ref 275–295)
Osmolality: 244 mosm/kg — CL (ref 275–295)

## 2024-03-26 MED ORDER — SODIUM CHLORIDE 0.9 % IV BOLUS (SEPSIS)
1000.0000 mL | Freq: Once | INTRAVENOUS | Status: AC
  Administered 2024-03-26: 1000 mL via INTRAVENOUS

## 2024-03-26 MED ORDER — BUDESON-GLYCOPYRROL-FORMOTEROL 160-9-4.8 MCG/ACT IN AERO
2.0000 | INHALATION_SPRAY | Freq: Two times a day (BID) | RESPIRATORY_TRACT | Status: DC
  Administered 2024-03-26 – 2024-03-28 (×4): 2 via RESPIRATORY_TRACT
  Filled 2024-03-26: qty 5.9

## 2024-03-26 MED ORDER — ROSUVASTATIN CALCIUM 5 MG PO TABS
5.0000 mg | ORAL_TABLET | Freq: Every day | ORAL | Status: DC
  Administered 2024-03-27 – 2024-03-28 (×2): 5 mg via ORAL
  Filled 2024-03-26 (×3): qty 1

## 2024-03-26 MED ORDER — MORPHINE SULFATE (PF) 2 MG/ML IV SOLN: 2.0000 mg | INTRAVENOUS | Status: DC | PRN

## 2024-03-26 MED ORDER — FOLIC ACID 1 MG PO TABS
1.0000 mg | ORAL_TABLET | Freq: Every day | ORAL | Status: DC
  Administered 2024-03-26 – 2024-03-28 (×3): 1 mg via ORAL
  Filled 2024-03-26 (×3): qty 1

## 2024-03-26 MED ORDER — IPRATROPIUM-ALBUTEROL 0.5-2.5 (3) MG/3ML IN SOLN
3.0000 mL | Freq: Four times a day (QID) | RESPIRATORY_TRACT | Status: DC
  Administered 2024-03-26 – 2024-03-27 (×4): 3 mL via RESPIRATORY_TRACT
  Filled 2024-03-26 (×5): qty 3

## 2024-03-26 MED ORDER — IPRATROPIUM-ALBUTEROL 0.5-2.5 (3) MG/3ML IN SOLN
3.0000 mL | Freq: Once | RESPIRATORY_TRACT | Status: AC
  Administered 2024-03-26: 3 mL via RESPIRATORY_TRACT
  Filled 2024-03-26: qty 3

## 2024-03-26 MED ORDER — MAGNESIUM SULFATE 2 GM/50ML IV SOLN
2.0000 g | Freq: Once | INTRAVENOUS | Status: AC
  Administered 2024-03-26: 2 g via INTRAVENOUS
  Filled 2024-03-26: qty 50

## 2024-03-26 MED ORDER — BISACODYL 5 MG PO TBEC: 5.0000 mg | DELAYED_RELEASE_TABLET | Freq: Every day | ORAL | Status: DC | PRN

## 2024-03-26 MED ORDER — THIAMINE HCL 100 MG/ML IJ SOLN: 100.0000 mg | INTRAMUSCULAR | Status: DC

## 2024-03-26 MED ORDER — FENTANYL CITRATE PF 50 MCG/ML IJ SOSY
50.0000 ug | PREFILLED_SYRINGE | Freq: Once | INTRAMUSCULAR | Status: AC
  Administered 2024-03-26: 50 ug via INTRAVENOUS
  Filled 2024-03-26: qty 1

## 2024-03-26 MED ORDER — ALBUTEROL SULFATE (2.5 MG/3ML) 0.083% IN NEBU: 2.5000 mg | INHALATION_SOLUTION | RESPIRATORY_TRACT | Status: DC | PRN

## 2024-03-26 MED ORDER — THIAMINE HCL 100 MG/ML IJ SOLN: 100.0000 mg | Freq: Every day | INTRAMUSCULAR | Status: DC

## 2024-03-26 MED ORDER — OXYCODONE HCL 5 MG PO TABS: 5.0000 mg | ORAL_TABLET | ORAL | Status: DC | PRN

## 2024-03-26 MED ORDER — NICOTINE 14 MG/24HR TD PT24
14.0000 mg | MEDICATED_PATCH | Freq: Every day | TRANSDERMAL | Status: DC
  Administered 2024-03-27 – 2024-03-28 (×2): 14 mg via TRANSDERMAL
  Filled 2024-03-26 (×3): qty 1

## 2024-03-26 MED ORDER — ONDANSETRON HCL 4 MG PO TABS: 4.0000 mg | ORAL_TABLET | Freq: Four times a day (QID) | ORAL | Status: DC | PRN

## 2024-03-26 MED ORDER — ONDANSETRON HCL 4 MG/2ML IJ SOLN
4.0000 mg | Freq: Once | INTRAMUSCULAR | Status: AC
  Administered 2024-03-26: 4 mg via INTRAVENOUS
  Filled 2024-03-26: qty 2

## 2024-03-26 MED ORDER — ACETAMINOPHEN 325 MG RE SUPP
650.0000 mg | Freq: Four times a day (QID) | RECTAL | Status: DC | PRN
  Filled 2024-03-26: qty 2

## 2024-03-26 MED ORDER — ADULT MULTIVITAMIN W/MINERALS CH
1.0000 | ORAL_TABLET | Freq: Every day | ORAL | Status: DC
  Administered 2024-03-27 – 2024-03-28 (×2): 1 via ORAL
  Filled 2024-03-26 (×3): qty 1

## 2024-03-26 MED ORDER — SODIUM CHLORIDE 3 % IV BOLUS
100.0000 mL | Freq: Once | INTRAVENOUS | Status: DC
  Filled 2024-03-26: qty 500

## 2024-03-26 MED ORDER — POLYETHYLENE GLYCOL 3350 17 G PO PACK
17.0000 g | PACK | Freq: Every day | ORAL | Status: DC | PRN
Start: 2024-03-26 — End: 2024-03-28

## 2024-03-26 MED ORDER — LORAZEPAM 1 MG PO TABS: 1.0000 mg | ORAL_TABLET | ORAL | Status: DC | PRN

## 2024-03-26 MED ORDER — SODIUM CHLORIDE 3 % IV SOLN: INTRAVENOUS | Status: AC

## 2024-03-26 MED ORDER — CITALOPRAM HYDROBROMIDE 20 MG PO TABS
40.0000 mg | ORAL_TABLET | Freq: Every day | ORAL | Status: DC
  Administered 2024-03-27 – 2024-03-28 (×2): 40 mg via ORAL
  Filled 2024-03-26 (×2): qty 2

## 2024-03-26 MED ORDER — ORAL CARE MOUTH RINSE: 15.0000 mL | OROMUCOSAL | Status: DC | PRN

## 2024-03-26 MED ORDER — PHENOBARBITAL SODIUM 130 MG/ML IJ SOLN: 130.0000 mg | INTRAMUSCULAR | Status: DC | PRN

## 2024-03-26 MED ORDER — THIAMINE HCL 100 MG/ML IJ SOLN
500.0000 mg | INTRAVENOUS | Status: AC
  Administered 2024-03-26 – 2024-03-28 (×3): 500 mg via INTRAVENOUS
  Filled 2024-03-26 (×2): qty 5
  Filled 2024-03-26: qty 2

## 2024-03-26 MED ORDER — SODIUM CHLORIDE 0.9 % IV SOLN: INTRAVENOUS | Status: DC

## 2024-03-26 MED ORDER — HYDRALAZINE HCL 20 MG/ML IJ SOLN: 5.0000 mg | INTRAMUSCULAR | Status: DC | PRN

## 2024-03-26 MED ORDER — SODIUM CHLORIDE 0.9% FLUSH
3.0000 mL | Freq: Two times a day (BID) | INTRAVENOUS | Status: DC
  Administered 2024-03-26 – 2024-03-28 (×4): 3 mL via INTRAVENOUS

## 2024-03-26 MED ORDER — RAMIPRIL 5 MG PO CAPS
10.0000 mg | ORAL_CAPSULE | Freq: Every day | ORAL | Status: DC
  Administered 2024-03-27 – 2024-03-28 (×2): 10 mg via ORAL
  Filled 2024-03-26 (×3): qty 2

## 2024-03-26 MED ORDER — CHLORHEXIDINE GLUCONATE CLOTH 2 % EX PADS
6.0000 | MEDICATED_PAD | Freq: Every day | CUTANEOUS | Status: DC
  Administered 2024-03-26 – 2024-03-28 (×3): 6 via TOPICAL

## 2024-03-26 MED ORDER — METOCLOPRAMIDE HCL 5 MG/ML IJ SOLN
10.0000 mg | Freq: Once | INTRAMUSCULAR | Status: AC
  Administered 2024-03-26: 10 mg via INTRAVENOUS
  Filled 2024-03-26: qty 2

## 2024-03-26 MED ORDER — THIAMINE MONONITRATE 100 MG PO TABS: 100.0000 mg | ORAL_TABLET | Freq: Every day | ORAL | Status: DC

## 2024-03-26 MED ORDER — ONDANSETRON HCL 4 MG/2ML IJ SOLN: 4.0000 mg | Freq: Four times a day (QID) | INTRAMUSCULAR | Status: DC | PRN

## 2024-03-26 MED ORDER — SODIUM CHLORIDE 3 % IV BOLUS
50.0000 mL | Freq: Once | INTRAVENOUS | Status: AC
  Administered 2024-03-26: 50 mL via INTRAVENOUS
  Filled 2024-03-26: qty 500

## 2024-03-26 MED ORDER — IPRATROPIUM-ALBUTEROL 0.5-2.5 (3) MG/3ML IN SOLN
3.0000 mL | Freq: Four times a day (QID) | RESPIRATORY_TRACT | Status: DC
  Administered 2024-03-26: 3 mL via RESPIRATORY_TRACT
  Filled 2024-03-26: qty 3

## 2024-03-26 MED ORDER — LORAZEPAM 2 MG/ML IJ SOLN
1.0000 mg | INTRAMUSCULAR | Status: DC | PRN
  Administered 2024-03-26: 3 mg via INTRAVENOUS
  Filled 2024-03-26: qty 2

## 2024-03-26 MED ORDER — SODIUM CHLORIDE 0.9 % IV SOLN
260.0000 mg | Freq: Once | INTRAVENOUS | Status: AC
  Administered 2024-03-26: 260 mg via INTRAVENOUS
  Filled 2024-03-26: qty 2

## 2024-03-26 MED ORDER — ENOXAPARIN SODIUM 40 MG/0.4ML IJ SOSY
40.0000 mg | PREFILLED_SYRINGE | INTRAMUSCULAR | Status: DC
  Administered 2024-03-26 – 2024-03-27 (×2): 40 mg via SUBCUTANEOUS
  Filled 2024-03-26 (×2): qty 0.4

## 2024-03-26 MED ORDER — SODIUM CHLORIDE 0.9 % IV SOLN
12.5000 mg | Freq: Once | INTRAVENOUS | Status: AC
  Administered 2024-03-26: 12.5 mg via INTRAVENOUS
  Filled 2024-03-26: qty 0.5

## 2024-03-26 MED ORDER — METHYLPREDNISOLONE SODIUM SUCC 125 MG IJ SOLR
125.0000 mg | Freq: Once | INTRAMUSCULAR | Status: AC
  Administered 2024-03-26: 125 mg via INTRAVENOUS
  Filled 2024-03-26: qty 2

## 2024-03-26 MED ORDER — SODIUM CHLORIDE 3% (HYPERTONIC SALINE) BOLUS VIA INFUSION
50.0000 mL | Freq: Once | INTRAVENOUS | Status: DC
  Filled 2024-03-26: qty 50

## 2024-03-26 MED ORDER — ACETAMINOPHEN 325 MG PO TABS
650.0000 mg | ORAL_TABLET | Freq: Four times a day (QID) | ORAL | Status: DC | PRN
  Administered 2024-03-28 (×2): 650 mg via ORAL
  Filled 2024-03-26 (×2): qty 2

## 2024-03-26 NOTE — Consult Note (Addendum)
 NAME:  Christine Sparks, MRN:  [id number], DOB:  1961-07-16, LOS: 0 ADMISSION DATE:  03/26/2024, CONSULTATION DATE:  03/26/2024 REFERRING MD:  Dr. Barbarann, CHIEF COMPLAINT:  Nausea, vomiting, AMS   Brief Pt Description / Synopsis:  63 y.o. female with PMHx significant for COPD and ETOH abuse admitted with Acute Metabolic Encephalopathy in the setting of severe Hypotonic Hyponatremia (suspect hypovolemic + beet potomania), along with developing Alcohol Withdrawal.  History of Present Illness:  Christine Sparks is a 63 year old female with past medical history significant for COPD not on baseline oxygen, alcohol abuse (2 bottles of wine daily), hypertension, hyperlipidemia, diabetes mellitus who presented to [facility] ED on 6/20//25 due to complaints of shortness of breath and generalized malaise and fatigue.  She also reports nausea, vomiting, and diarrhea for 2 days prior to presentation.  She denies chest pain, abdominal pain, dysuria, hematuria, fever, chills.  She also reports she had recently been on antibiotics for acute COPD exacerbation.  She reports she drinks 2 bottles of wine daily, with her last drink yesterday.  She also reports chronic benzodiazepine use, unclear as to when she last took benzos.  ED Course: Initial Vital Signs: Temperature 97.7 F orally, Pulse 80, respiratory rate 11, blood pressure 133/74, SpO2 98% Significant Labs: Sodium 115, chloride 83, glucose 109, serum osmolality 245, high-sensitivity troponin 10, WBC 12.9, hemoglobin 11.6, hematocrit 31.3 COVID/flu/RSV PCR is negative Urinalysis & urine tox screen currently pending Imaging Chest X-ray>>IMPRESSION: 1. No acute findings. 2. Hyperinflation and diffuse bronchial wall thickening compatible with COPD Medications Administered: 1 L of normal saline, DuoNeb, Reglan and Phenergan , fentanyl , 125 mg IV Solu-Medrol   [facility] asked to admit for further workup and treatment.  While in the ED patient with worsening mentation and becoming  tremulous and anxious concerning for developing alcohol withdrawal.  PCCM consulted.  Please see Significant Hospital Events section below for full detailed hospital course.   Pertinent  Medical History   Past Medical History:  Diagnosis Date   Acute respiratory failure with hypoxia (HCC) 09/19/2023   Anemia    Anxiety    Arthritis    Bronchitis, chronic obstructive (HCC)    COPD exacerbation (HCC) 06/20/2023   Depression    Diabetes mellitus 10/2010   a1c 5.7, fasting glu 155   Dyspnea    with exertion   H/O bronchitis    Heart murmur    High cholesterol    Hypertension    Menopausal syndrome    NSTEMI (non-ST elevated myocardial infarction) (HCC) 09/19/2023   Tobacco abuse     Micro Data:  6/24: COVID/flu/RSV PCR>> negative 6/24: C. difficile PCR>> 6/24: GI panel PCR.>>  Antimicrobials:   Anti-infectives (From admission, onward)    None       Significant Hospital Events: Including procedures, antibiotic start and stop dates in addition to other pertinent events   6/24: Admitted by [facility] for severe hyponatremia.  Mentation worsening and with concern for developing alcohol withdrawal, PCCM consulted.  Interim History / Subjective:  As outlined above under Significant Hospital Events section  Objective   Blood pressure (!) 157/70, pulse 100, temperature 98.3 F (36.8 C), temperature source Oral, resp. rate 19, weight 55.3 kg, SpO2 94%.        Intake/Output Summary (Last 24 hours) at 03/26/2024 0915 Last data filed at 03/26/2024 0640 Gross per 24 hour  Intake 1050 ml  Output --  Net 1050 ml   Filed Weights   03/26/24 0340  Weight: 55.3 kg  Examination: General: Acutely ill-appearing female, laying in bed, on 2 L nasal cannula, with mild tachypnea, anxious and mildly tremulous HENT: Atraumatic, normocephalic, neck supple, no JVD, dry MM Lungs: Mild expiratory wheezing throughout, even, mild tachypnea Cardiovascular: Regular rate and rhythm,  S1-S2, no murmurs, rubs, gallops Abdomen: Soft, nontender, nondistended, no guarding or tenderness, bowel sounds positive for 4 Extremities: Normal bulk and tone, no deformities, no edema Neuro: Awake and alert, oriented x 3, moves all extremities to commands, no focal deficits, speech clear GU: Deferred  Resolved Hospital Problem list     Assessment & Plan:   #Acute Metabolic Encephalopathy due to ... #Alcohol Withdrawal with high risk for DT's #Severe Hyponatremia PMHx: ETOH abuse (pt reports 2 bottles of wine daily) -Treatment of metabolic derangements as outlined below -Provide supportive care -Promote normal sleep/wake cycle and family presence -Avoid sedating medications as able -CIWA protocol -Bolus with 260 mg of IV phenobarbital x 1 dose ~ would continue to bolus with intermittent phenobarb until withdrawal symptoms alleviated -High-dose thiamine x 3 days, followed by 100 mg daily -Multivitamin, thiamine -Check UDS  #Severe Hypotonic Hyponatremia: suspect Hypovolemic in the setting of several days of nausea/vomiting/diarrhea, along with questionable beer potomania #Mild Hyperkalemia -Monitor I&O's / urinary output -Follow BMP (trend serum Na+ q4h) -Ensure adequate renal perfusion -Avoid nephrotoxic agents as able -Replace electrolytes as indicated ~ Pharmacy following for assistance with electrolyte replacement -Bolus with 100 ml 3% Saline x1 dose; can repeat up to 3x until symptoms resolve or Na increased by 5 mEq -Consider Nephrology consult -Goal Na+ correction of 6-8  mEq per 24 hours (max 8-10 mEq / 24h) -Urine Osmo & Urine Na+ pending  #Acute Hypoxic Respiratory Failure due to ... #Acute COPD Exacerbation PMHx: COPD -Supplemental O2 as needed to maintain O2 sats 88 to 92% -Follow intermittent Chest X-ray & ABG as needed -Bronchodilators -Pulmonary toilet as able  #Mild Leukocytosis #Questionable Gastroenteritis  -Monitor fever curve -Trend WBC's   -Follow cultures as above -CXR without evidence of pneumonia ~ Urinalysis and GI/C. Diff panel is currently pending -Hold off on empiric ABX for now with low threshold to begin pending cultures & sensitivities      Best Practice (right click and Reselect all SmartList Selections daily)   Diet/type: clear liquids DVT prophylaxis: LMWH GI prophylaxis: N/A Lines: N/A Foley:  N/A Code Status:  full code Last date of multidisciplinary goals of care discussion [6/24]  6/24: Pt updated at bedside on plan of care.  Labs   CBC: Recent Labs  Lab 03/26/24 0345  WBC 12.9*  NEUTROABS 11.2*  HGB 11.6*  HCT 31.3*  MCV 88.9  PLT 465*    Basic Metabolic Panel: Recent Labs  Lab 03/26/24 0324 03/26/24 0345 03/26/24 0843  NA  --  115* 116*  K  --  4.6 5.2*  CL  --  83* 82*  CO2  --  22 23  GLUCOSE  --  109* 151*  BUN  --  <5* 6*  CREATININE  --  0.42* 0.55  CALCIUM   --  8.3* 8.5*  MG 1.6*  --   --    GFR: Estimated Creatinine Clearance: 58 mL/min (by C-G formula based on SCr of 0.55 mg/dL). Recent Labs  Lab 03/26/24 0345  WBC 12.9*    Liver Function Tests: Recent Labs  Lab 03/26/24 0345  AST 30  ALT 32  ALKPHOS 58  BILITOT 0.7  PROT 5.7*  ALBUMIN 3.3*   Recent Labs  Lab 03/26/24 0345  LIPASE 26   No results for input(s): AMMONIA in the last 168 hours.  ABG    Component Value Date/Time   HCO3 24.8 10/13/2023 0851   ACIDBASEDEF 0.4 10/13/2023 0851   O2SAT 81.5 10/13/2023 0851     Coagulation Profile: No results for input(s): INR, PROTIME in the last 168 hours.  Cardiac Enzymes: No results for input(s): CKTOTAL, CKMB, CKMBINDEX, TROPONINI in the last 168 hours.  HbA1C: Hgb A1c MFr Bld  Date/Time Value Ref Range Status  01/09/2023 11:54 AM 5.2 4.8 - 5.6 % Final    Comment:             Prediabetes: 5.7 - 6.4          Diabetes: >6.4          Glycemic control for adults with diabetes: <7.0   11/27/2020 10:42 AM 5.1 4.8 - 5.6 %  Final    Comment:             Prediabetes: 5.7 - 6.4          Diabetes: >6.4          Glycemic control for adults with diabetes: <7.0     CBG: No results for input(s): GLUCAP in the last 168 hours.  Review of Systems:   Positives in BOLD: Gen: Denies fever, chills, weight change, generalized malaise, anxious, fatigue, night sweats HEENT: Denies blurred vision, double vision, hearing loss, tinnitus, sinus congestion, rhinorrhea, sore throat, neck stiffness, dysphagia PULM: Denies shortness of breath, cough, sputum production, hemoptysis, wheezing CV: Denies chest pain, edema, orthopnea, paroxysmal nocturnal dyspnea, palpitations GI: Denies abdominal pain, nausea, vomiting, diarrhea, hematochezia, melena, constipation, change in bowel habits GU: Denies dysuria, hematuria, polyuria, oliguria, urethral discharge Endocrine: Denies hot or cold intolerance, polyuria, polyphagia or appetite change Derm: Denies rash, dry skin, scaling or peeling skin change Heme: Denies easy bruising, bleeding, bleeding gums Neuro: Denies headache, numbness, weakness, slurred speech, loss of memory or consciousness, tremors   Past Medical History:  She,  has a past medical history of Acute respiratory failure with hypoxia (HCC) (09/19/2023), Anemia, Anxiety, Arthritis, Bronchitis, chronic obstructive (HCC), COPD exacerbation (HCC) (06/20/2023), Depression, Diabetes mellitus (10/2010), Dyspnea, H/O bronchitis, Heart murmur, High cholesterol, Hypertension, Menopausal syndrome, NSTEMI (non-ST elevated myocardial infarction) (HCC) (09/19/2023), and Tobacco abuse.   Surgical History:   Past Surgical History:  Procedure Laterality Date   BREAST BIOPSY Left 07/26/2016   complex sclerosing lesion. Coil shape marker   BREAST LUMPECTOMY Left 08/17/2016   complex sclerosing lesion with focal ADH. Clear margins. The coil shaped biopsy marker remains    BREAST LUMPECTOMY WITH NEEDLE LOCALIZATION Left 08/17/2016    Procedure: BREAST LUMPECTOMY WITH NEEDLE LOCALIZATION;  Surgeon: Louanne KANDICE Muse, MD;  Location: [facility] ORS;  Service: General;  Laterality: Left;   CERVICAL CONE BIOPSY     DILATION AND CURETTAGE OF UTERUS     EXCISION / BIOPSY BREAST / NIPPLE / DUCT Left 08/17/2016   complex sclerosing lesion removed   LIPOMA EXCISION Right    benign tumor right leg   LYMPH NODE BIOPSY     TONSILLECTOMY       Social History:   reports that she has been smoking cigarettes. She started smoking about 45 years ago. She has a 22.6 pack-year smoking history. She has been exposed to tobacco smoke. She has never used smokeless tobacco. She reports current alcohol use. She reports that she does not use drugs.   Family History:  Her family history includes Breast  cancer (age of onset: 75) in her paternal aunt; Cancer in her father and mother; Colon cancer in an other family member; Early death in her brother; Heart disease in her brother. There is no history of Ovarian cancer or Diabetes.   Allergies Allergies  Allergen Reactions   Zithromax  [Azithromycin ] Nausea And Vomiting   Quetiapine  Other (See Comments)    nightmares   Buspar  [Buspirone ] Other (See Comments)    Made her feel crazy     Home Medications  Prior to Admission medications   Medication Sig Start Date End Date Taking? Authorizing Provider  albuterol  (VENTOLIN  HFA) 108 (90 Base) MCG/ACT inhaler Inhale 2 puffs into the lungs every 6 (six) hours as needed for shortness of breath. 02/06/24 02/05/25  Herold Hadassah SQUIBB, MD  citalopram  (CELEXA ) 40 MG tablet Take 1 tablet (40 mg total) by mouth daily. 12/06/23   Herold Hadassah SQUIBB, MD  ipratropium-albuterol  (DUONEB) 0.5-2.5 (3) MG/3ML SOLN Take 3 mLs by nebulization every 6 (six) hours as needed. 02/07/24   Herold Hadassah SQUIBB, MD  LORazepam  (ATIVAN ) 0.5 MG tablet Take 1 tablet (0.5 mg total) by mouth daily as needed for anxiety. 03/13/24   Herold Hadassah SQUIBB, MD  ramipril  (ALTACE ) 10 MG capsule Take 1 capsule (10  mg total) by mouth daily. 12/15/23   Herold Hadassah SQUIBB, MD  rosuvastatin  (CRESTOR ) 5 MG tablet Take 1 tablet (5 mg total) by mouth daily. 12/06/23 12/05/24  Herold Hadassah SQUIBB, MD  TRELEGY ELLIPTA  100-62.5-25 MCG/ACT AEPB Inhale 1 puff into the lungs daily. 01/19/24   Melvin Pao, NP     Critical care time: 50 minutes     Inge Lecher, AGACNP-BC [facility] Prefer epic messenger for cross cover needs If after hours, please call E-link

## 2024-03-26 NOTE — ED Notes (Signed)
 Fall precautions in place for Pt. This RN placed fall band, fall grip socks, bed alarm and fall sign.

## 2024-03-26 NOTE — ED Triage Notes (Signed)
 Arrives via EMS from home N/V/D x2 days, SOB x1 day MedHx: COPD EMS assessment Lungs sounds dim,  181/86, 98% RA, HR 80, 12 lead WNL, CBG 180 EMS gave 4 mg Zofran , 1 DuoNeb EMS sepsis screen negative  #20 RAC by EMS

## 2024-03-26 NOTE — Progress Notes (Signed)
 Patient unable to void with purewick and bedpan. Bladder scan = 942 cc urine. IN/OUT order placed by MD s/t message from this RN. NT Chasity assisted this RN with in/out. Patient voided 1000cc. Patient received peri care and resting in bed comfortably at this time.

## 2024-03-26 NOTE — Progress Notes (Signed)
 Patient arrived from ED. Vitals WDL. CN, this RN and NT settled patient in room. MRSA, CBG, vital checks completed. IVPB meds administered. Patient oriented to location, year, and herself. Glasses on. Upper dentures at home per patient report. Cane at bedside. Patient provided with meal tray, set up by RN and assisted patient as needed. Lab to bedside for lab draws. Bed alarm on, call light within reach. Patient resting comfortably at this time.

## 2024-03-26 NOTE — ED Notes (Signed)
 Patient states she takes ativan  every day. She states she needs it now for nerves. RN explained that there was no order for ativan . No attending assigned to patient at this time.

## 2024-03-26 NOTE — ED Notes (Signed)
K - 5.2

## 2024-03-26 NOTE — ED Notes (Signed)
 Pt states she needs something for her nerves. States it is warm in the room and she needs ativan  because she takes it daily and is having withdrawals. RN notified.

## 2024-03-26 NOTE — ED Provider Notes (Signed)
 [facility] Provider Note    Event Date/Time   First MD Initiated Contact with Patient 03/26/24 [phone]     (approximate)   History   Nausea (W/SOB)   HPI  Christine Sparks is a 63 y.o. female with history of COPD and chronic bronchitis not on oxygen, hypertension, hyperlipidemia, diabetes who presents to the emergency department with complaints of shortness of breath.  Also complains of nausea, vomiting and diarrhea for 2 days and feeling weak.  No chest pain, abdominal pain, dysuria, hematuria, vaginal bleeding or discharge.  States she has been on antibiotics recently after a COPD exacerbation.  No sick contacts or recent travel.  Given breathing treatments and Zofran  with EMS with minimal relief.   History provided by patient.    Past Medical History:  Diagnosis Date   Acute respiratory failure with hypoxia (HCC) 09/19/2023   Anemia    Anxiety    Arthritis    Bronchitis, chronic obstructive (HCC)    COPD exacerbation (HCC) 06/20/2023   Depression    Diabetes mellitus 10/2010   a1c 5.7, fasting glu 155   Dyspnea    with exertion   H/O bronchitis    Heart murmur    High cholesterol    Hypertension    Menopausal syndrome    NSTEMI (non-ST elevated myocardial infarction) (HCC) 09/19/2023   Tobacco abuse     Past Surgical History:  Procedure Laterality Date   BREAST BIOPSY Left 07/26/2016   complex sclerosing lesion. Coil shape marker   BREAST LUMPECTOMY Left 08/17/2016   complex sclerosing lesion with focal ADH. Clear margins. The coil shaped biopsy marker remains    BREAST LUMPECTOMY WITH NEEDLE LOCALIZATION Left 08/17/2016   Procedure: BREAST LUMPECTOMY WITH NEEDLE LOCALIZATION;  Surgeon: Louanne KANDICE Muse, MD;  Location: [facility] ORS;  Service: General;  Laterality: Left;   CERVICAL CONE BIOPSY     DILATION AND CURETTAGE OF UTERUS     EXCISION / BIOPSY BREAST / NIPPLE / DUCT Left 08/17/2016   complex sclerosing lesion removed   LIPOMA  EXCISION Right    benign tumor right leg   LYMPH NODE BIOPSY     TONSILLECTOMY      MEDICATIONS:  Prior to Admission medications   Medication Sig Start Date End Date Taking? Authorizing Provider  albuterol  (VENTOLIN  HFA) 108 (90 Base) MCG/ACT inhaler Inhale 2 puffs into the lungs every 6 (six) hours as needed for shortness of breath. 02/06/24 02/05/25  Herold Hadassah SQUIBB, MD  citalopram  (CELEXA ) 40 MG tablet Take 1 tablet (40 mg total) by mouth daily. 12/06/23   Herold Hadassah SQUIBB, MD  ipratropium-albuterol  (DUONEB) 0.5-2.5 (3) MG/3ML SOLN Take 3 mLs by nebulization every 6 (six) hours as needed. 02/07/24   Herold Hadassah SQUIBB, MD  LORazepam  (ATIVAN ) 0.5 MG tablet Take 1 tablet (0.5 mg total) by mouth daily as needed for anxiety. 03/13/24   Herold Hadassah SQUIBB, MD  ramipril  (ALTACE ) 10 MG capsule Take 1 capsule (10 mg total) by mouth daily. 12/15/23   Herold Hadassah SQUIBB, MD  rosuvastatin  (CRESTOR ) 5 MG tablet Take 1 tablet (5 mg total) by mouth daily. 12/06/23 12/05/24  Herold Hadassah SQUIBB, MD  TRELEGY ELLIPTA  100-62.5-25 MCG/ACT AEPB Inhale 1 puff into the lungs daily. 01/19/24   Melvin Pao, NP    Physical Exam   Triage Vital Signs: ED Triage Vitals  Encounter Vitals Group     BP 03/26/24 0345 133/74     Girls Systolic BP Percentile --  Girls Diastolic BP Percentile --      Boys Systolic BP Percentile --      Boys Diastolic BP Percentile --      Pulse Rate 03/26/24 0343 80     Resp 03/26/24 0343 11     Temp --      Temp src --      SpO2 03/26/24 0338 98 %     Weight 03/26/24 0340 121 lb 14.4 oz (55.3 kg)     Height --      Head Circumference --      Peak Flow --      Pain Score --      Pain Loc --      Pain Education --      Exclude from Growth Chart --     Most recent vital signs: Vitals:   03/26/24 0411 03/26/24 0430  BP:  (!) 149/71  Pulse:  79  Resp:  15  Temp: 97.7 F (36.5 C)   SpO2:  100%    CONSTITUTIONAL: Alert, responds appropriately to questions. Well-appearing;  well-nourished HEAD: Normocephalic, atraumatic EYES: Conjunctivae clear, pupils appear equal, sclera nonicteric ENT: normal nose; moist mucous membranes NECK: Supple, normal ROM CARD: RRR; S1 and S2 appreciated RESP: Normal chest excursion without splinting or tachypnea; breath sounds clear and equal bilaterally; no wheezes, no rhonchi, no rales, no hypoxia or respiratory distress, speaking full sentences, diminished aeration at bases bilaterally ABD/GI: Non-distended; soft, non-tender, no rebound, no guarding, no peritoneal signs BACK: The back appears normal EXT: Normal ROM in all joints; no deformity noted, no edema SKIN: Normal color for age and race; warm; no rash on exposed skin NEURO: Moves all extremities equally, normal speech PSYCH: The patient's mood and manner are appropriate.   ED Results / Procedures / Treatments   LABS: (all labs ordered are listed, but only abnormal results are displayed) Labs Reviewed  CBC WITH DIFFERENTIAL/PLATELET - Abnormal; Notable for the following components:      Result Value   WBC 12.9 (*)    RBC 3.52 (*)    Hemoglobin 11.6 (*)    HCT 31.3 (*)    MCHC 37.1 (*)    Platelets 465 (*)    Neutro Abs 11.2 (*)    All other components within normal limits  COMPREHENSIVE METABOLIC PANEL WITH GFR - Abnormal; Notable for the following components:   Sodium 115 (*)    Chloride 83 (*)    Glucose, Bld 109 (*)    BUN <5 (*)    Creatinine, Ser 0.42 (*)    Calcium  8.3 (*)    Total Protein 5.7 (*)    Albumin 3.3 (*)    All other components within normal limits  RESP PANEL BY RT-PCR (RSV, FLU A&B, COVID)  RVPGX2  C DIFFICILE QUICK SCREEN W PCR REFLEX    GASTROINTESTINAL PANEL BY PCR, STOOL (REPLACES STOOL CULTURE)  LIPASE, BLOOD  URINALYSIS, ROUTINE W REFLEX MICROSCOPIC  MAGNESIUM  OSMOLALITY  TROPONIN I (HIGH SENSITIVITY)  TROPONIN I (HIGH SENSITIVITY)     EKG:  EKG Interpretation Date/Time:  Tuesday March 26 2024 03:44:04 EDT Ventricular  Rate:  79 PR Interval:  111 QRS Duration:  77 QT Interval:  381 QTC Calculation: 437 R Axis:   84  Text Interpretation: Sinus rhythm Borderline short PR interval Borderline right axis deviation Confirmed by Neomi Neptune [phone]) on 03/26/2024 4:25:32 AM         RADIOLOGY: My personal review and interpretation of imaging: Chest  x-ray clear.  I have personally reviewed all radiology reports.   DG Chest Portable 1 View Result Date: 03/26/2024 CLINICAL DATA:  Shortness of breath. EXAM: PORTABLE CHEST 1 VIEW COMPARISON:  10/13/2023 FINDINGS: Normal heart size. No pleural fluid, interstitial edema or airspace disease. No pleural fluid, interstitial edema or airspace disease. Lungs are hyperinflated. Coarsened interstitial markings and diffuse bronchial wall thickening. Visualized osseous structures of demonstrate noted acute findings. IMPRESSION: 1. No acute findings. 2. Hyperinflation and diffuse bronchial wall thickening compatible with COPD. Electronically Signed   By: Waddell Calk M.D.   On: 03/26/2024 05:24     PROCEDURES:  Critical Care performed: Yes, see critical care procedure note(s)   CRITICAL CARE Performed by: Josette Savayah Waltrip   Total critical care time: 30 minutes  Critical care time was exclusive of separately billable procedures and treating other patients.  Critical care was necessary to treat or prevent imminent or life-threatening deterioration.  Critical care was time spent personally by me on the following activities: development of treatment plan with patient and/or surrogate as well as nursing, discussions with consultants, evaluation of patient's response to treatment, examination of patient, obtaining history from patient or surrogate, ordering and performing treatments and interventions, ordering and review of laboratory studies, ordering and review of radiographic studies, pulse oximetry and re-evaluation of patient's condition.   [name]1-3 Lead EKG  Interpretation  Performed by: Jaxson Anglin, Josette SAILOR, DO Authorized by: Addisen Chappelle, Josette SAILOR, DO     Interpretation: normal     ECG rate:  80   ECG rate assessment: normal     Rhythm: sinus rhythm     Ectopy: none     Conduction: normal       IMPRESSION / MDM / ASSESSMENT AND PLAN / ED COURSE  I reviewed the triage vital signs and the nursing notes.    Patient here with shortness of breath, nausea, vomiting and diarrhea.  The patient is on the cardiac monitor to evaluate for evidence of arrhythmia and/or significant heart rate changes.   DIFFERENTIAL DIAGNOSIS (includes but not limited to):   COPD exacerbation, pneumonia, viral URI, ACS, PE, doubt pneumothorax or CHF.  Differential also includes viral gastroenteritis, dehydration, UTI, low suspicion for appendicitis, cholecystitis, pancreatitis, bowel obstruction given benign exam.   Patient's presentation is most consistent with acute presentation with potential threat to life or bodily function.   PLAN: Will obtain labs, urine, chest x-ray, respiratory viral swab.  Will give IV fluids, antiemetics, continued breathing treatments and IV steroids.  Patient high risk for C. difficile given recent antibiotic use.  Will obtain stool cultures.   MEDICATIONS GIVEN IN ED: Medications  promethazine  (PHENERGAN ) 12.5 mg in sodium chloride  0.9 % 50 mL IVPB (has no administration in time range)  ipratropium-albuterol  (DUONEB) 0.5-2.5 (3) MG/3ML nebulizer solution 3 mL (has no administration in time range)  sodium chloride  0.9 % bolus 1,000 mL (1,000 mLs Intravenous New Bag/Given 03/26/24 0415)  ipratropium-albuterol  (DUONEB) 0.5-2.5 (3) MG/3ML nebulizer solution 3 mL (3 mLs Nebulization Given 03/26/24 0422)  ondansetron  (ZOFRAN ) injection 4 mg (4 mg Intravenous Given 03/26/24 0417)  methylPREDNISolone  sodium succinate (SOLU-MEDROL ) 125 mg/2 mL injection 125 mg (125 mg Intravenous Given 03/26/24 0423)  metoCLOPramide (REGLAN) injection 10 mg (10 mg  Intravenous Given 03/26/24 0508)  fentaNYL  (SUBLIMAZE ) injection 50 mcg (50 mcg Intravenous Given 03/26/24 0508)     ED COURSE: Patient has a leukocytosis of 12.9.  Hemoglobin of 11.6 which is her baseline.  Sodium level has come back quite low  at 115 which it appears she has had a history of hyponatremia before.  Normal potassium.  Will check magnesium level.  Normal LFTs, lipase.  Troponin negative.  COVID, flu and RSV negative.  Chest x-ray reviewed and interpreted by myself and the radiologist end is unremarkable.  Will check urine, serum osmolality.  Will continue to hydrate and admit to the hospitalist service for hyponatremia.  She is unable to quantify how much water she has been drinking.  Unclear the cause of her hyponatremia today whether it is hypotonic hyponatremia or from GI loss.  She did receive 1 L of fluid in the ED.  Will fluid restrict given her prior history.   CONSULTS:  Consulted and discussed patient's case with hospitalist, Dr. Cleatus.  I have recommended admission and consulting physician agrees and will place admission orders.  Patient (and family if present) agree with this plan.   I reviewed all nursing notes, vitals, pertinent previous records.  All labs, EKGs, imaging ordered have been independently reviewed and interpreted by myself.    OUTSIDE RECORDS REVIEWED: Reviewed last PCP note on 03/13/2024.  Reviewed last admission in January 2025 where hyponatremia was thought secondary to too much fluid at home and improved with fluid restriction.       FINAL CLINICAL IMPRESSION(S) / ED DIAGNOSES   Final diagnoses:  COPD with acute exacerbation (HCC)  Nausea vomiting and diarrhea  Hyponatremia     Rx / DC Orders   ED Discharge Orders     None        Note:  This document was prepared using Dragon voice recognition software and may include unintentional dictation errors.   Devyn Sheerin, Josette SAILOR, DO 03/26/24 [phone]

## 2024-03-26 NOTE — H&P (Signed)
 History and Physical    Patient: Christine Sparks FMW:[id number] DOB: 24-Dec-1960 DOA: 03/26/2024 DOS: the patient was seen and examined on 03/26/2024 PCP: Herold Hadassah SQUIBB, MD  Patient coming from: Home - lives alone; NOK: Arzella Planas Maud, [phone]   Chief Complaint: SOB  HPI: Christine Sparks is a 63 y.o. female with medical history significant of COPD, depression, DM, HTN, HLD, and ETOH use d/o presenting with SOB.  She reports feeling SOB, feeling somewhat confused.  She starting vomiting for 2 days.  She has had very bad heartburn.  Also diarrhea.  No sick contacts, does not think foodborne.  Tolerating water.  Last emesis was 90 minutes ago, last diarrhea was prior to coming to the ER.  She is a smoker some days, up to 10 cigs/day.  Drinks ETOH daily, beer or wine, 2 bottles of wine, last drink yesterday.  Not on home O2.  She is very dizzy, feels like she is actively withdrawing at this time.    ER Course:  Carryover per EDP patient here with shortness of breath, COPD exacerbation not on oxygen. Getting breathing treatments and steroids. Also complains of 2 days of vomiting and diarrhea. Abdominal exam benign. No complaints of abdominal pain. Possible gastroenteritis. Labs show sodium of 115. She has had previous history of hypotonic hyponatremia for secondary to drinking too much water at home. She is unable to quantify how much she has been drinking at home. She did receive a liter of fluid here. Wonder if some of this could also be from GI loss. Will fluid restrict.      Review of Systems: As mentioned in the history of present illness. All other systems reviewed and are negative. Past Medical History:  Diagnosis Date   Acute respiratory failure with hypoxia (HCC) 09/19/2023   Anemia    Anxiety    Arthritis    Bronchitis, chronic obstructive (HCC)    COPD exacerbation (HCC) 06/20/2023   Depression    Diabetes mellitus 10/2010   a1c 5.7, fasting glu 155   Dyspnea    with  exertion   H/O bronchitis    Heart murmur    High cholesterol    Hypertension    Menopausal syndrome    NSTEMI (non-ST elevated myocardial infarction) (HCC) 09/19/2023   Tobacco abuse    Past Surgical History:  Procedure Laterality Date   BREAST BIOPSY Left 07/26/2016   complex sclerosing lesion. Coil shape marker   BREAST LUMPECTOMY Left 08/17/2016   complex sclerosing lesion with focal ADH. Clear margins. The coil shaped biopsy marker remains    BREAST LUMPECTOMY WITH NEEDLE LOCALIZATION Left 08/17/2016   Procedure: BREAST LUMPECTOMY WITH NEEDLE LOCALIZATION;  Surgeon: Louanne KANDICE Muse, MD;  Location: [facility] ORS;  Service: General;  Laterality: Left;   CERVICAL CONE BIOPSY     DILATION AND CURETTAGE OF UTERUS     EXCISION / BIOPSY BREAST / NIPPLE / DUCT Left 08/17/2016   complex sclerosing lesion removed   LIPOMA EXCISION Right    benign tumor right leg   LYMPH NODE BIOPSY     TONSILLECTOMY     Social History:  reports that she has been smoking cigarettes. She started smoking about 45 years ago. She has a 22.6 pack-year smoking history. She has been exposed to tobacco smoke. She has never used smokeless tobacco. She reports current alcohol use. She reports that she does not use drugs.  Allergies  Allergen Reactions   Zithromax  [Azithromycin ] Nausea And Vomiting  Quetiapine  Other (See Comments)    nightmares   Buspar  [Buspirone ] Other (See Comments)    Made her feel crazy    Family History  Problem Relation Age of Onset   Cancer Mother        melanoma   Cancer Father        Lung Cancer   Early death Brother    Heart disease Brother    Breast cancer Paternal Aunt 109   Colon cancer Other    Ovarian cancer Neg Hx    Diabetes Neg Hx     Prior to Admission medications   Medication Sig Start Date End Date Taking? Authorizing Provider  albuterol  (VENTOLIN  HFA) 108 (90 Base) MCG/ACT inhaler Inhale 2 puffs into the lungs every 6 (six) hours as needed for shortness  of breath. 02/06/24 02/05/25  Herold Hadassah SQUIBB, MD  citalopram  (CELEXA ) 40 MG tablet Take 1 tablet (40 mg total) by mouth daily. 12/06/23   Herold Hadassah SQUIBB, MD  ipratropium-albuterol  (DUONEB) 0.5-2.5 (3) MG/3ML SOLN Take 3 mLs by nebulization every 6 (six) hours as needed. 02/07/24   Herold Hadassah SQUIBB, MD  LORazepam  (ATIVAN ) 0.5 MG tablet Take 1 tablet (0.5 mg total) by mouth daily as needed for anxiety. 03/13/24   Herold Hadassah SQUIBB, MD  ramipril  (ALTACE ) 10 MG capsule Take 1 capsule (10 mg total) by mouth daily. 12/15/23   Herold Hadassah SQUIBB, MD  rosuvastatin  (CRESTOR ) 5 MG tablet Take 1 tablet (5 mg total) by mouth daily. 12/06/23 12/05/24  Herold Hadassah SQUIBB, MD  TRELEGY ELLIPTA  100-62.5-25 MCG/ACT AEPB Inhale 1 puff into the lungs daily. 01/19/24   Melvin Pao, NP    Physical Exam: Vitals:   03/26/24 0800 03/26/24 0930 03/26/24 0941 03/26/24 1025  BP: (!) 157/70 (!) 151/81 (!) 151/81 (!) 143/63  Pulse: 100 96 96 99  Resp:  (!) 27  (!) 27  Temp:    97.9 F (36.6 C)  TempSrc:    Axillary  SpO2:  100%  100%  Weight:    52.9 kg  Height:    5' 2 (1.575 m)   General:  Appears uncomfortable, mildly confused Eyes:  pupils are quite dilated and minimally reactive ENT:  grossly normal hearing, lips & tongue, mildly dry mm Neck:  no LAD, masses or thyromegaly Cardiovascular:  RRR. No LE edema.  Respiratory:   Expiratory wheezing with moderate air movement.  Mildly increased respiratory effort. Abdomen:  soft, NT, ND Skin:  no rash or induration seen on limited exam Musculoskeletal:  grossly normal tone BUE/BLE, good ROM, no bony abnormality Psychiatric:  anxious mood and affect, speech fluent and mostly appropriate, AOx2 Neurologic:  CN 2-12 grossly intact, moves all extremities in coordinated fashion, dizzy    Radiological Exams on Admission: Independently reviewed - see discussion in A/P where applicable  DG Chest Portable 1 View Result Date: 03/26/2024 CLINICAL DATA:  Shortness of breath. EXAM:  PORTABLE CHEST 1 VIEW COMPARISON:  10/13/2023 FINDINGS: Normal heart size. No pleural fluid, interstitial edema or airspace disease. No pleural fluid, interstitial edema or airspace disease. Lungs are hyperinflated. Coarsened interstitial markings and diffuse bronchial wall thickening. Visualized osseous structures of demonstrate noted acute findings. IMPRESSION: 1. No acute findings. 2. Hyperinflation and diffuse bronchial wall thickening compatible with COPD. Electronically Signed   By: Waddell Calk M.D.   On: 03/26/2024 05:24    EKG: Independently reviewed.  NSR with rate 79;no evidence of acute ischemia   Labs on Admission: I have personally reviewed the  available labs and imaging studies at the time of the admission.  Pertinent labs:    Na++ 115 -> 116, 132 on 2/14 Glucose 109 -> 151 Albumin 3.3 HS troponin 10, 34 Osm 245 WBC 12.9 Hgb 11.6 COVID/flu.RSV negative   Assessment and Plan: Principal Problem:   Hyponatremia Active Problems:   COPD (chronic obstructive pulmonary disease) (HCC)   Tobacco abuse   Essential hypertension, benign   Mixed hyperlipidemia   Alcohol withdrawal syndrome, with delirium (HCC)    Hyponatremia Etiology appears to be hypovolemic hyponatremia; patient with recent GI symptoms.  Likely also in conjunction with beer potomania. She does have dizziness and confusion but Na++ is stable/unchanged despite these issues worsening and so ETOH withdrawal is considered to be more likely the etiology PCCM is consulting Sodium markedly low, only increased from 115 to 116 after IVF bolus in the ER In an effort to prevent overcorrection (>80mEq/L/day so as to avoid central pontine myelinolysis needs gentle correction and continue to monitor q4h BMP for now Dr. Isadora recommends 3% NS boluses 100 cc at a time for gentle correction  Alcohol withdrawal Patient with chronic ETOH dependence Number of drinks per day: 2 bottles of wine Patient is exhibiting active  s/sx of withdrawal, CIWA score is 16 -> 7 She is at high risk for complications of withdrawal including seizures, DTs; with order phenobarbital to help get through DTs Will admit to SDU at this time CIWA protocol Folate, thiamine, and MVI ordered Will provide symptom-triggered BZD (ativan  per CIWA protocol) in addition to phenobarbital TOC team consult for possible inpatient treatment Will also check UDS. -Elevated LFTs are likely related to alcoholism -Consider offering a medication for Alcohol Use Disorder at the time of d/c, to include Disulfuram; Naltrexone; or Acamprosate.  N/V/D Uncertain etiology - viral vs. ETOH withdrawal GI pathogen panel pending but no diarrhea since presentation Suspect that this acute volume loss contributed to her hyponatremia Hydration, as above  Elevated troponin Normal on presentation, increased from 10 to 34 on repeat so positive delta Likely related to demand ischemia Will trend troponin to peak  COPD Baseline COPD, continues to smoke Not on home O2 She is feeling SOB and expiratory wheezing Low suspicion for acute COPD exacerbation, suspect this is related to her other acute issues Continue Trelegy Add Duonebs q6h and prn Albuterol  q2h for now  DM Last A1c was 5.2 Normal glucose today on presentation Will hold treatment at this time  HTN Continue ramipril   HLD Continue rosuvastatin      Advance Care Planning:   Code Status: Full Code - Code status was discussed with the patient and/or family at the time of admission.  The patient would want to receive full resuscitative measures at this time.   Consults: PCCM  DVT Prophylaxis: Lovenox   Family Communication: None present; I left a message with her NOK by telephone at the time of admission  Severity of Illness: The appropriate patient status for this patient is INPATIENT. Inpatient status is judged to be reasonable and necessary in order to provide the required intensity of  service to ensure the patient's safety. The patient's presenting symptoms, physical exam findings, and initial radiographic and laboratory data in the context of their chronic comorbidities is felt to place them at high risk for further clinical deterioration. Furthermore, it is not anticipated that the patient will be medically stable for discharge from the hospital within 2 midnights of admission.   * I certify that at the point of admission it  is my clinical judgment that the patient will require inpatient hospital care spanning beyond 2 midnights from the point of admission due to high intensity of service, high risk for further deterioration and high frequency of surveillance required.*    Total critical care time:  65 minutes Critical care time was exclusive of separately billable procedures and treating other patients. Critical care was necessary to treat or prevent imminent or life-threatening deterioration. Critical care was time spent personally by me on the following activities: development of treatment plan with patient and/or surrogate as well as nursing, discussions with consultants, evaluation of patient's response to treatment, examination of patient, obtaining history from patient or surrogate, ordering and performing treatments and interventions, ordering and review of laboratory studies, ordering and review of radiographic studies, pulse oximetry and re-evaluation of patient's condition.    Author: Delon Herald, MD 03/26/2024 10:49 AM  For on call review [email or web address].

## 2024-03-26 NOTE — ED Notes (Signed)
 Provider paged for critical result

## 2024-03-26 NOTE — Plan of Care (Signed)
  Problem: Clinical Measurements: Goal: Ability to maintain clinical measurements within normal limits will improve Outcome: Progressing Goal: Will remain free from infection Outcome: Progressing Goal: Diagnostic test results will improve Outcome: Progressing Goal: Respiratory complications will improve Outcome: Progressing Goal: Cardiovascular complication will be avoided Outcome: Progressing   Problem: Coping: Goal: Level of anxiety will decrease Outcome: Progressing   Problem: Elimination: Goal: Will not experience complications related to bowel motility Outcome: Progressing   Problem: Pain Managment: Goal: General experience of comfort will improve and/or be controlled Outcome: Progressing   Problem: Safety: Goal: Ability to remain free from injury will improve Outcome: Progressing   Problem: Skin Integrity: Goal: Risk for impaired skin integrity will decrease Outcome: Progressing   Problem: Education: Goal: Knowledge of General Education information will improve Description: Including pain rating scale, medication(s)/side effects and non-pharmacologic comfort measures Outcome: Not Progressing   Problem: Health Behavior/Discharge Planning: Goal: Ability to manage health-related needs will improve Outcome: Not Progressing   Problem: Activity: Goal: Risk for activity intolerance will decrease Outcome: Not Progressing   Problem: Nutrition: Goal: Adequate nutrition will be maintained Outcome: Not Progressing   Problem: Elimination: Goal: Will not experience complications related to urinary retention Outcome: Not Progressing

## 2024-03-26 NOTE — Progress Notes (Signed)
 Date and time results received: 03/26/24 1604 Lab called to report.   Test: Sodium Critical Value: 118   Name of Provider Notified: Isadora, MD   Orders Received? yes Actions Taken?: New order from [facility] for 3%

## 2024-03-26 NOTE — ED Notes (Addendum)
 Blood and swab sent to lab with Pt save tube labels

## 2024-03-26 NOTE — Progress Notes (Addendum)
 Date and time results received: 03/26/24 1248 Muhammed Usman from Lab called to report.  Test: Sodium Critical Value: 116  Name of Provider Notified: Delon Herald, MD  Orders Received? Orders already in Or Actions Taken?: Herald, MD stated that they were working on giving patient 3% sodium.

## 2024-03-26 NOTE — Progress Notes (Signed)
 MEDICATION RELATED CONSULT NOTE - INITIAL   Pharmacy Consult for 3% hypertonic saline  Indication: hyponatremia  Allergies  Allergen Reactions   Zithromax  [Azithromycin ] Nausea And Vomiting   Quetiapine  Other (See Comments)    nightmares   Buspar  [Buspirone ] Other (See Comments)    Made her feel crazy    Patient Measurements: Height: 5' 2 (157.5 cm) Weight: 52.9 kg (116 lb 10 oz) IBW/kg (Calculated) : 50.1  Vital Signs: Temp: 97.9 F (36.6 C) (06/24 1025) Temp Source: Axillary (06/24 1025) BP: 143/63 (06/24 1025) Pulse Rate: 99 (06/24 1025) Intake/Output from previous day: 06/23 0701 - 06/24 0700 In: 1050 [IV Piggyback:1050] Out: -  Intake/Output from this shift: Total I/O In: 152 [IV Piggyback:152] Out: -   Labs: Recent Labs    03/26/24 0324 03/26/24 0345 03/26/24 0843 03/26/24 1217  WBC  --  12.9*  --   --   HGB  --  11.6*  --   --   HCT  --  31.3*  --   --   PLT  --  465*  --   --   CREATININE  --  0.42* 0.55 0.57  MG 1.6*  --   --   --   ALBUMIN  --  3.3*  --   --   PROT  --  5.7*  --   --   AST  --  30  --   --   ALT  --  32  --   --   ALKPHOS  --  58  --   --   BILITOT  --  0.7  --   --    Estimated Creatinine Clearance: 55.4 mL/min (by C-G formula based on SCr of 0.57 mg/dL).   Microbiology: Recent Results (from the past 720 hours)  Resp panel by RT-PCR (RSV, Flu A&B, Covid) Anterior Nasal Swab     Status: None   Collection Time: 03/26/24  3:45 AM   Specimen: Anterior Nasal Swab  Result Value Ref Range Status   SARS Coronavirus 2 by RT PCR NEGATIVE NEGATIVE Final    Comment: (NOTE) SARS-CoV-2 target nucleic acids are NOT DETECTED.  The SARS-CoV-2 RNA is generally detectable in upper respiratory specimens during the acute phase of infection. The lowest concentration of SARS-CoV-2 viral copies this assay can detect is 138 copies/mL. A negative result does not preclude SARS-Cov-2 infection and should not be used as the sole basis for  treatment or other patient management decisions. A negative result may occur with  improper specimen collection/handling, submission of specimen other than nasopharyngeal swab, presence of viral mutation(s) within the areas targeted by this assay, and inadequate number of viral copies(<138 copies/mL). A negative result must be combined with clinical observations, patient history, and epidemiological information. The expected result is Negative.  Fact Sheet for Patients:  BloggerCourse.com  Fact Sheet for Healthcare Providers:  SeriousBroker.it  This test is no t yet approved or cleared by the [place]  FDA and  has been authorized for detection and/or diagnosis of SARS-CoV-2 by FDA under an Emergency Use Authorization (EUA). This EUA will remain  in effect (meaning this test can be used) for the duration of the COVID-19 declaration under Section 564(b)(1) of the Act, 21 U.S.C.section 360bbb-3(b)(1), unless the authorization is terminated  or revoked sooner.       Influenza A by PCR NEGATIVE NEGATIVE Final   Influenza B by PCR NEGATIVE NEGATIVE Final    Comment: (NOTE) The Xpert Xpress SARS-CoV-2/FLU/RSV plus assay is  intended as an aid in the diagnosis of influenza from Nasopharyngeal swab specimens and should not be used as a sole basis for treatment. Nasal washings and aspirates are unacceptable for Xpert Xpress SARS-CoV-2/FLU/RSV testing.  Fact Sheet for Patients: BloggerCourse.com  Fact Sheet for Healthcare Providers: SeriousBroker.it  This test is not yet approved or cleared by the [place]  FDA and has been authorized for detection and/or diagnosis of SARS-CoV-2 by FDA under an Emergency Use Authorization (EUA). This EUA will remain in effect (meaning this test can be used) for the duration of the COVID-19 declaration under Section 564(b)(1) of the Act, 21  U.S.C. section 360bbb-3(b)(1), unless the authorization is terminated or revoked.     Resp Syncytial Virus by PCR NEGATIVE NEGATIVE Final    Comment: (NOTE) Fact Sheet for Patients: BloggerCourse.com  Fact Sheet for Healthcare Providers: SeriousBroker.it  This test is not yet approved or cleared by the [place]  FDA and has been authorized for detection and/or diagnosis of SARS-CoV-2 by FDA under an Emergency Use Authorization (EUA). This EUA will remain in effect (meaning this test can be used) for the duration of the COVID-19 declaration under Section 564(b)(1) of the Act, 21 U.S.C. section 360bbb-3(b)(1), unless the authorization is terminated or revoked.  Performed at [facility], [address] [place], [place] [address]   MRSA Next Gen by PCR, Nasal     Status: None   Collection Time: 03/26/24 10:24 AM   Specimen: Nasal Mucosa; Nasal Swab  Result Value Ref Range Status   MRSA by PCR Next Gen NOT DETECTED NOT DETECTED Final    Comment: (NOTE) The GeneXpert MRSA Assay (FDA approved for NASAL specimens only), is one component of a comprehensive MRSA colonization surveillance program. It is not intended to diagnose MRSA infection nor to guide or monitor treatment for MRSA infections. Test performance is not FDA approved in patients less than 32 years old. Performed at [facility], [address] [place], [place] [address]     Medical History: Past Medical History:  Diagnosis Date   Acute respiratory failure with hypoxia (HCC) 09/19/2023   Anemia    Anxiety    Arthritis    Bronchitis, chronic obstructive (HCC)    COPD exacerbation (HCC) 06/20/2023   Depression    Diabetes mellitus 10/2010   a1c 5.7, fasting glu 155   Dyspnea    with exertion   H/O bronchitis    Heart murmur    High cholesterol    Hypertension    Menopausal syndrome    NSTEMI (non-ST elevated myocardial infarction)  (HCC) 09/19/2023   Tobacco abuse     Medications:  Scheduled:   budesonide -glycopyrrolate-formoterol  2 puff Inhalation BID   Chlorhexidine  Gluconate Cloth  6 each Topical Daily   citalopram   40 mg Oral Daily   enoxaparin  (LOVENOX ) injection  40 mg Subcutaneous Q24H   folic acid  1 mg Oral Daily   ipratropium-albuterol   3 mL Nebulization Q6H   multivitamin with minerals  1 tablet Oral Daily   nicotine   14 mg Transdermal Daily   ramipril   10 mg Oral Daily   rosuvastatin   5 mg Oral Daily   sodium chloride  flush  3 mL Intravenous Q12H   [START ON 03/29/2024] thiamine (VITAMIN B1) injection  100 mg Intravenous Q24H   Infusions:   sodium chloride  (hypertonic) 50 mL/hr at 03/26/24 1238   thiamine (VITAMIN B1) injection 500 mg (03/26/24 1132)    Assessment: 63 y.o. female with medical history significant  of COPD, depression, DM, HTN, HLD, and ETOH use d/o presenting with SOB.   Goal of Therapy:  --Monitor Na q2h x 2 occurrences, then q4h --Increase in Na by 4-6 mEq/L in 4-6  hours  Plan:  Dr. Isadora recommends 3% NS boluses intermittently: first is 50 mL over one hour Do not exceed Na increase of 8 - 10 mEq/L in 24 hours  Call RN to stop infusion and notify MD if  Na increases by 4 mEq/L or more in the first 2 hours  Or Na increases by 6 mEq/L or more in the first 4 hours   Christine Sparks Bolster 03/26/2024,1:52 PM

## 2024-03-26 NOTE — ED Notes (Signed)
 Hollace Herald, MD that patient requesting Ativan . Also messaged MD that patient states she normally drinks 3 glasses of wine daily and CIWA scale 16. Patient in bed who scores as a high fall risk. Patient has removed and refused to wear non-skid socks while in bed. Fall alarm on on bed.

## 2024-03-26 NOTE — ED Notes (Signed)
 This RN attempted to get urine and stool sample from Pt she states she is unable to go at the moment

## 2024-03-26 NOTE — ED Notes (Signed)
 Lab called and serum osmo 245

## 2024-03-26 NOTE — Progress Notes (Signed)
 Critical lab troponin 64 called from lab to [employer]. MD made aware.

## 2024-03-27 LAB — SODIUM
Sodium: 120 mmol/L — ABNORMAL LOW (ref 135–145)
Sodium: 121 mmol/L — ABNORMAL LOW (ref 135–145)
Sodium: 121 mmol/L — ABNORMAL LOW (ref 135–145)
Sodium: 123 mmol/L — ABNORMAL LOW (ref 135–145)
Sodium: 123 mmol/L — ABNORMAL LOW (ref 135–145)
Sodium: 124 mmol/L — ABNORMAL LOW (ref 135–145)

## 2024-03-27 LAB — CORTISOL-AM, BLOOD: Cortisol - AM: 1.4 ug/dL — ABNORMAL LOW (ref 6.7–22.6)

## 2024-03-27 LAB — CBC
HCT: 26.8 % — ABNORMAL LOW (ref 36.0–46.0)
Hemoglobin: 9.5 g/dL — ABNORMAL LOW (ref 12.0–15.0)
MCH: 32.5 pg (ref 26.0–34.0)
MCHC: 35.4 g/dL (ref 30.0–36.0)
MCV: 91.8 fL (ref 80.0–100.0)
Platelets: 359 10*3/uL (ref 150–400)
RBC: 2.92 MIL/uL — ABNORMAL LOW (ref 3.87–5.11)
RDW: 14.4 % (ref 11.5–15.5)
WBC: 10.4 10*3/uL (ref 4.0–10.5)
nRBC: 0 % (ref 0.0–0.2)

## 2024-03-27 LAB — PHOSPHORUS: Phosphorus: 3.8 mg/dL (ref 2.5–4.6)

## 2024-03-27 LAB — MAGNESIUM: Magnesium: 2.1 mg/dL (ref 1.7–2.4)

## 2024-03-27 LAB — BASIC METABOLIC PANEL WITH GFR
Anion gap: 8 (ref 5–15)
BUN: 7 mg/dL — ABNORMAL LOW (ref 8–23)
CO2: 24 mmol/L (ref 22–32)
Calcium: 8.6 mg/dL — ABNORMAL LOW (ref 8.9–10.3)
Chloride: 92 mmol/L — ABNORMAL LOW (ref 98–111)
Creatinine, Ser: 0.54 mg/dL (ref 0.44–1.00)
GFR, Estimated: >60 mL/min (ref >60)
Glucose, Bld: 94 mg/dL (ref 70–99)
Potassium: 4.3 mmol/L (ref 3.5–5.1)
Sodium: 124 mmol/L — ABNORMAL LOW (ref 135–145)

## 2024-03-27 LAB — T4, FREE: Free T4: 1.07 ng/dL (ref 0.61–1.12)

## 2024-03-27 LAB — TSH: TSH: 0.981 u[IU]/mL (ref 0.350–4.500)

## 2024-03-27 MED ORDER — LORAZEPAM 0.5 MG PO TABS
0.5000 mg | ORAL_TABLET | Freq: Every day | ORAL | Status: DC
  Administered 2024-03-27: 0.5 mg via ORAL
  Filled 2024-03-27: qty 1

## 2024-03-27 MED ORDER — IPRATROPIUM-ALBUTEROL 0.5-2.5 (3) MG/3ML IN SOLN
3.0000 mL | Freq: Two times a day (BID) | RESPIRATORY_TRACT | Status: DC
  Administered 2024-03-28: 3 mL via RESPIRATORY_TRACT
  Filled 2024-03-27: qty 3

## 2024-03-27 MED ORDER — FAMOTIDINE 20 MG PO TABS
20.0000 mg | ORAL_TABLET | Freq: Two times a day (BID) | ORAL | Status: DC
  Administered 2024-03-27 – 2024-03-28 (×4): 20 mg via ORAL
  Filled 2024-03-27 (×4): qty 1

## 2024-03-27 MED ORDER — SODIUM CHLORIDE 3 % IV SOLN
INTRAVENOUS | Status: DC
  Filled 2024-03-27 (×3): qty 500

## 2024-03-27 MED ORDER — SODIUM CHLORIDE 3 % IV BOLUS
50.0000 mL | Freq: Once | INTRAVENOUS | Status: AC
  Administered 2024-03-27: 50 mL via INTRAVENOUS
  Filled 2024-03-27: qty 500

## 2024-03-27 MED ORDER — LORAZEPAM 2 MG/ML IJ SOLN: 1.0000 mg | INTRAMUSCULAR | Status: DC | PRN

## 2024-03-27 MED ORDER — LORAZEPAM 1 MG PO TABS
1.0000 mg | ORAL_TABLET | ORAL | Status: DC | PRN
  Administered 2024-03-27: 1 mg via ORAL
  Filled 2024-03-27: qty 1

## 2024-03-27 MED ORDER — CALCIUM CARBONATE ANTACID 500 MG PO CHEW
400.0000 mg | CHEWABLE_TABLET | Freq: Four times a day (QID) | ORAL | Status: DC | PRN
  Administered 2024-03-27: 400 mg via ORAL
  Filled 2024-03-27: qty 2

## 2024-03-27 NOTE — Plan of Care (Signed)
  Problem: Elimination: Goal: Will not experience complications related to urinary retention Outcome: Progressing   Problem: Pain Managment: Goal: General experience of comfort will improve and/or be controlled Outcome: Progressing   Problem: Safety: Goal: Ability to remain free from injury will improve Outcome: Progressing   Problem: Education: Goal: Knowledge of General Education information will improve Description: Including pain rating scale, medication(s)/side effects and non-pharmacologic comfort measures Outcome: Not Progressing   Problem: Health Behavior/Discharge Planning: Goal: Ability to manage health-related needs will improve Outcome: Not Progressing   Problem: Clinical Measurements: Goal: Ability to maintain clinical measurements within normal limits will improve Outcome: Not Progressing Goal: Diagnostic test results will improve Outcome: Not Progressing Goal: Respiratory complications will improve Outcome: Not Progressing   Problem: Activity: Goal: Risk for activity intolerance will decrease Outcome: Not Progressing   Problem: Nutrition: Goal: Adequate nutrition will be maintained Outcome: Not Progressing   Problem: Skin Integrity: Goal: Risk for impaired skin integrity will decrease Outcome: Not Progressing

## 2024-03-27 NOTE — Progress Notes (Signed)
 MEDICATION RELATED CONSULT NOTE - INITIAL   Pharmacy Consult for 3% hypertonic saline  Indication: hyponatremia  Allergies  Allergen Reactions   Zithromax  [Azithromycin ] Nausea And Vomiting   Quetiapine  Other (See Comments)    nightmares   Buspar  [Buspirone ] Other (See Comments)    Made her feel crazy    Patient Measurements: Height: 5' 2 (157.5 cm) Weight: 52.9 kg (116 lb 10 oz) IBW/kg (Calculated) : 50.1  Vital Signs: Temp: 97.9 F (36.6 C) (06/25 0900) BP: 155/117 (06/25 1036) Pulse Rate: 96 (06/25 1229) Intake/Output from previous day: 06/24 0701 - 06/25 0700 In: 1145 [P.O.:720; I.V.:132.4; IV Piggyback:292.6] Out: 2125 [Urine:2125] Intake/Output from this shift: No intake/output data recorded.  Labs: Recent Labs    03/26/24 0324 03/26/24 0345 03/26/24 0843 03/26/24 1217 03/26/24 1518 03/27/24 0512  WBC  --  12.9*  --   --   --  10.4  HGB  --  11.6*  --   --   --  9.5*  HCT  --  31.3*  --   --   --  26.8*  PLT  --  465*  --   --   --  359  CREATININE  --  0.42*   < > 0.57 0.46 0.54  MG 1.6*  --   --   --   --  2.1  PHOS  --   --   --   --   --  3.8  ALBUMIN  --  3.3*  --   --   --   --   PROT  --  5.7*  --   --   --   --   AST  --  30  --   --   --   --   ALT  --  32  --   --   --   --   ALKPHOS  --  58  --   --   --   --   BILITOT  --  0.7  --   --   --   --    < > = values in this interval not displayed.   Estimated Creatinine Clearance: 55.4 mL/min (by C-G formula based on SCr of 0.54 mg/dL).   Microbiology: Recent Results (from the past 720 hours)  Resp panel by RT-PCR (RSV, Flu A&B, Covid) Anterior Nasal Swab     Status: None   Collection Time: 03/26/24  3:45 AM   Specimen: Anterior Nasal Swab  Result Value Ref Range Status   SARS Coronavirus 2 by RT PCR NEGATIVE NEGATIVE Final    Comment: (NOTE) SARS-CoV-2 target nucleic acids are NOT DETECTED.  The SARS-CoV-2 RNA is generally detectable in upper respiratory specimens during the acute  phase of infection. The lowest concentration of SARS-CoV-2 viral copies this assay can detect is 138 copies/mL. A negative result does not preclude SARS-Cov-2 infection and should not be used as the sole basis for treatment or other patient management decisions. A negative result may occur with  improper specimen collection/handling, submission of specimen other than nasopharyngeal swab, presence of viral mutation(s) within the areas targeted by this assay, and inadequate number of viral copies(<138 copies/mL). A negative result must be combined with clinical observations, patient history, and epidemiological information. The expected result is Negative.  Fact Sheet for Patients:  BloggerCourse.com  Fact Sheet for Healthcare Providers:  SeriousBroker.it  This test is no t yet approved or cleared by the [place]  FDA and  has been authorized  for detection and/or diagnosis of SARS-CoV-2 by FDA under an Emergency Use Authorization (EUA). This EUA will remain  in effect (meaning this test can be used) for the duration of the COVID-19 declaration under Section 564(b)(1) of the Act, 21 U.S.C.section 360bbb-3(b)(1), unless the authorization is terminated  or revoked sooner.       Influenza A by PCR NEGATIVE NEGATIVE Final   Influenza B by PCR NEGATIVE NEGATIVE Final    Comment: (NOTE) The Xpert Xpress SARS-CoV-2/FLU/RSV plus assay is intended as an aid in the diagnosis of influenza from Nasopharyngeal swab specimens and should not be used as a sole basis for treatment. Nasal washings and aspirates are unacceptable for Xpert Xpress SARS-CoV-2/FLU/RSV testing.  Fact Sheet for Patients: BloggerCourse.com  Fact Sheet for Healthcare Providers: SeriousBroker.it  This test is not yet approved or cleared by the [place]  FDA and has been authorized for detection and/or diagnosis of  SARS-CoV-2 by FDA under an Emergency Use Authorization (EUA). This EUA will remain in effect (meaning this test can be used) for the duration of the COVID-19 declaration under Section 564(b)(1) of the Act, 21 U.S.C. section 360bbb-3(b)(1), unless the authorization is terminated or revoked.     Resp Syncytial Virus by PCR NEGATIVE NEGATIVE Final    Comment: (NOTE) Fact Sheet for Patients: BloggerCourse.com  Fact Sheet for Healthcare Providers: SeriousBroker.it  This test is not yet approved or cleared by the [place]  FDA and has been authorized for detection and/or diagnosis of SARS-CoV-2 by FDA under an Emergency Use Authorization (EUA). This EUA will remain in effect (meaning this test can be used) for the duration of the COVID-19 declaration under Section 564(b)(1) of the Act, 21 U.S.C. section 360bbb-3(b)(1), unless the authorization is terminated or revoked.  Performed at [facility], [address] [place], [place] [address]   MRSA Next Gen by PCR, Nasal     Status: None   Collection Time: 03/26/24 10:24 AM   Specimen: Nasal Mucosa; Nasal Swab  Result Value Ref Range Status   MRSA by PCR Next Gen NOT DETECTED NOT DETECTED Final    Comment: (NOTE) The GeneXpert MRSA Assay (FDA approved for NASAL specimens only), is one component of a comprehensive MRSA colonization surveillance program. It is not intended to diagnose MRSA infection nor to guide or monitor treatment for MRSA infections. Test performance is not FDA approved in patients less than 2 years old. Performed at [facility], [address] [place], [place] [address]     Medical History: Past Medical History:  Diagnosis Date   Acute respiratory failure with hypoxia (HCC) 09/19/2023   Anemia    Anxiety    Arthritis    Bronchitis, chronic obstructive (HCC)    COPD exacerbation (HCC) 06/20/2023   Depression    Diabetes mellitus  10/2010   a1c 5.7, fasting glu 155   Dyspnea    with exertion   H/O bronchitis    Heart murmur    High cholesterol    Hypertension    Menopausal syndrome    NSTEMI (non-ST elevated myocardial infarction) (HCC) 09/19/2023   Tobacco abuse     Medications:  Scheduled:   budesonide -glycopyrrolate-formoterol  2 puff Inhalation BID   Chlorhexidine  Gluconate Cloth  6 each Topical Daily   citalopram   40 mg Oral Daily   enoxaparin  (LOVENOX ) injection  40 mg Subcutaneous Q24H   famotidine   20 mg Oral BID   folic acid  1 mg Oral Daily   [START ON 03/28/2024] ipratropium-albuterol   3 mL Nebulization BID   LORazepam   0.5 mg Oral Daily   multivitamin with minerals  1 tablet Oral Daily   nicotine   14 mg Transdermal Daily   ramipril   10 mg Oral Daily   rosuvastatin   5 mg Oral Daily   sodium chloride  flush  3 mL Intravenous Q12H   [START ON 03/29/2024] thiamine (VITAMIN B1) injection  100 mg Intravenous Q24H   Infusions:   sodium chloride  (hypertonic)     thiamine (VITAMIN B1) injection 500 mg (03/27/24 1310)    Assessment: 63 y.o. female with medical history significant of COPD, depression, DM, HTN, HLD, and ETOH use d/o presenting with SOB. She was started on intermittent 3% saline boluses, which corrected sodium but Na is now trending back down  Goal of Therapy:  --Monitor Na every 4 hours --Increase in Na by 4-6 mEq/L in 4-6  hours  Plan:  3% saline started at 40 mL (limited by stop time of 06/25 2130) Do not exceed Na increase of 8 - 10 mEq/L in 24 hours  Call RN to stop infusion and notify MD if  Na increases by 6 mEq/L or more in the first 4 hours   Christine Sparks 03/27/2024,2:47 PM

## 2024-03-27 NOTE — Progress Notes (Signed)
 PROGRESS NOTE Christine Sparks    DOB: September 23, 1961, 63 y.o.  FMW:[id number]    Code Status: Full Code   DOA: 03/26/2024   LOS: 1  Brief hospital course  Christine Sparks is a 63 y.o. female with medical history significant of COPD, depression, DM, HTN, HLD, and ETOH use d/o presenting with SOB, vomiting, diarrhea. Drinks ETOH daily, beer or wine, 2 bottles of wine, last drink day prior to arrival. Not on home O2.  ER Course: sodium 115, serum osmolality 245. hypoxia requiring 2Lnc, WBC 12.9. neg covid, flu, rsv. Blood gas overall within limits save for decreased pO2.  Initial treatment included breathing treatments, steroids, IVF.   Patient was admitted to medicine service for further workup and management of hyponatremia and alcohol withdrawal as outlined in detail below.  03/27/24 -stable, clinically improving  Assessment & Plan  Principal Problem:   Hyponatremia Active Problems:   COPD (chronic obstructive pulmonary disease) (HCC)   Tobacco abuse   Essential hypertension, benign   Mixed hyperlipidemia   Alcohol withdrawal syndrome, with delirium (HCC)  Hyponatremia- Na+ 115>>>>121. S/p short course of hypertonic saline. After fluids stopped her Na began decreasing again so will again give short course of hypertonic saline. Symptoms have improved. CCM has signed off.  - continue Na+ monitoring q6hr - fluid restrict   Alcohol withdrawal- denies hallucination. CIWA this morning 11 - PRN ativan  ordered. If CIWAs remain elevated, consider librium taper. S/p phenobarbitol - supportive care - thiamine - folic acid - TOC consult   N/V/D Uncertain etiology - viral vs. ETOH withdrawal GI pathogen ordered at admission but no BM since arrival   Elevated troponin- chest pain free Normal on presentation, increased from 10 to 34 on repeat so positive delta Likely related to demand ischemia Will trend troponin to peak   COPD- no productive cough, do not suspect need for Abx, steroids. Does not  feel SOB currently Baseline COPD, continues to smoke Not on home O2. Currently on 4L - wean as tolerated - breathing treatments scheduled and PRN  DM- Last A1c was 5.2   HTN Continue ramipril    HLD Continue rosuvastatin   Body mass index is 21.33 kg/m.  VTE ppx: enoxaparin  (LOVENOX ) injection 40 mg Start: 03/26/24 2200  Diet:     Diet   Diet clear liquid Room service appropriate? Yes; Fluid consistency: Thin; Fluid restriction: 1500 mL Fluid   Consultants: CCM  Subjective 03/27/24    Pt reports feeling much better. Denies nausea or diarrhea. Has not been out of bed much yet. Tolerated fluids this am.    Objective  Blood pressure 114/78, pulse 76, temperature 98.4 F (36.9 C), temperature source Oral, resp. rate (!) 28, height 5' 2 (1.575 m), weight 52.9 kg, SpO2 97%.  Intake/Output Summary (Last 24 hours) at 03/27/2024 0807 Last data filed at 03/27/2024 0600 Gross per 24 hour  Intake 1144.99 ml  Output 2125 ml  Net -980.01 ml   Filed Weights   03/26/24 0340 03/26/24 1025  Weight: 55.3 kg 52.9 kg    Physical Exam:  General: awake, alert, NAD HEENT: atraumatic, clear conjunctiva, anicteric sclera, MMM, hearing grossly normal Respiratory: normal respiratory effort. Cardiovascular: quick capillary refill, normal S1/S2, RRR, no JVD, murmurs Gastrointestinal: soft, NT, ND Nervous: A&O x3. no gross focal neurologic deficits, normal speech Extremities: moves all equally, no edema, normal tone Skin: dry, intact, normal temperature, normal color. No rashes, lesions or ulcers on exposed skin Psychiatry: normal mood, congruent affect  Labs  I have personally reviewed the following labs and imaging studies CBC    Component Value Date/Time   WBC 10.4 03/27/2024 0512   RBC 2.92 (L) 03/27/2024 0512   HGB 9.5 (L) 03/27/2024 0512   HGB 13.9 01/09/2023 1154   HCT 26.8 (L) 03/27/2024 0512   HCT 40.4 01/09/2023 1154   PLT 359 03/27/2024 0512   PLT 464 (H) 11/27/2020  1042   MCV 91.8 03/27/2024 0512   MCV 97 01/09/2023 1154   MCH 32.5 03/27/2024 0512   MCHC 35.4 03/27/2024 0512   RDW 14.4 03/27/2024 0512   RDW 12.3 01/09/2023 1154   LYMPHSABS 0.9 03/26/2024 0345   LYMPHSABS 1.4 01/09/2023 1154   MONOABS 0.7 03/26/2024 0345   EOSABS 0.1 03/26/2024 0345   EOSABS 0.1 01/09/2023 1154   BASOSABS 0.1 03/26/2024 0345   BASOSABS 0.1 01/09/2023 1154      Latest Ref Rng & Units 03/27/2024    5:12 AM 03/27/2024    2:34 AM 03/27/2024   12:38 AM  BMP  Glucose 70 - 99 mg/dL 94     BUN 8 - 23 mg/dL 7     Creatinine 9.55 - 1.00 mg/dL 9.45     Sodium 864 - 854 mmol/L 124  120  123   Potassium 3.5 - 5.1 mmol/L 4.3     Chloride 98 - 111 mmol/L 92     CO2 22 - 32 mmol/L 24     Calcium  8.9 - 10.3 mg/dL 8.6       DG Chest Portable 1 View Result Date: 03/26/2024 CLINICAL DATA:  Shortness of breath. EXAM: PORTABLE CHEST 1 VIEW COMPARISON:  10/13/2023 FINDINGS: Normal heart size. No pleural fluid, interstitial edema or airspace disease. No pleural fluid, interstitial edema or airspace disease. Lungs are hyperinflated. Coarsened interstitial markings and diffuse bronchial wall thickening. Visualized osseous structures of demonstrate noted acute findings. IMPRESSION: 1. No acute findings. 2. Hyperinflation and diffuse bronchial wall thickening compatible with COPD. Electronically Signed   By: Waddell Calk M.D.   On: 03/26/2024 05:24    Disposition Plan & Communication  Patient status: Inpatient  Admitted From: Home Planned disposition location: Home Anticipated discharge date: 6/26 pending electrolyte improvement   Family Communication: none at bedside    Author: Marien LITTIE Piety, DO [facility] 03/27/2024, 8:07 AM   Available by Epic secure chat 7AM-7PM. If 7PM-7AM, please contact night-coverage.  [facility] contact information found on [email or web address].

## 2024-03-28 DIAGNOSIS — R112 Nausea with vomiting, unspecified: Secondary | ICD-10-CM | POA: Diagnosis not present

## 2024-03-28 DIAGNOSIS — E871 Hypo-osmolality and hyponatremia: Secondary | ICD-10-CM | POA: Diagnosis not present

## 2024-03-28 DIAGNOSIS — J441 Chronic obstructive pulmonary disease with (acute) exacerbation: Principal | ICD-10-CM

## 2024-03-28 DIAGNOSIS — R197 Diarrhea, unspecified: Secondary | ICD-10-CM

## 2024-03-28 LAB — SODIUM
Sodium: 125 mmol/L — ABNORMAL LOW (ref 135–145)
Sodium: 128 mmol/L — ABNORMAL LOW (ref 135–145)
Sodium: 128 mmol/L — ABNORMAL LOW (ref 135–145)
Sodium: 128 mmol/L — ABNORMAL LOW (ref 135–145)

## 2024-03-28 LAB — T3, FREE: T3, Free: 2.3 pg/mL (ref 2.0–4.4)

## 2024-03-28 MED ORDER — PANTOPRAZOLE SODIUM 40 MG PO TBEC: 40.0000 mg | DELAYED_RELEASE_TABLET | Freq: Every day | ORAL | 0 refills | Status: DC

## 2024-03-28 MED ORDER — SODIUM CHLORIDE 1 G PO TABS: 1.0000 g | ORAL_TABLET | Freq: Three times a day (TID) | ORAL | 0 refills | Status: AC

## 2024-03-28 MED ORDER — SODIUM CHLORIDE 1 G PO TABS
1.0000 g | ORAL_TABLET | Freq: Three times a day (TID) | ORAL | Status: DC
  Administered 2024-03-28: 1 g via ORAL
  Filled 2024-03-28 (×2): qty 1

## 2024-03-28 NOTE — TOC Transition Note (Signed)
 Transition of Care [facility]) - Discharge Note   Patient Details  Name: Christine Sparks MRN: [id number] Date of Birth: 18-Jun-1961  Transition of [facility]) CM/SW Contact:  Celestial Barnfield A Alexio Sroka, RN Phone Number: 03/28/2024, 2:24 PM   Clinical Narrative:    Chart reviewed.  Noted that patient has orders for discharge today.  Patient was admitted for Hyponatremia and received a short course of Hypertonic saline.    I have meet with patient at bedside.  She informs me that prior to admission she lived at home by herself.  She reports that she was able to bath and dress herself prior to admission.  Mr. Mee reports that she gets around with a cane.    I have spoken to patient about ETOH usage. She informs me that she drinks 3 glasses of wine before dinner daily.  She reports that she does not feel that she has an ETOH problem.  She is was open to ETOH resources.  I have placed on discharge instructions.    [name] reports that she has a supportive friend Marval.  [name] informs me that that Marval is not able to pick her up today from the hospital due to prior commitments.  I have provided staff nurse with [employer].    I have informed staff nurse of the above information.     Final next level of care: Home/Self Care Barriers to Discharge: No Barriers Identified   Patient Goals and CMS Choice            Discharge Placement                    Patient and family notified of of transfer: 03/28/24  Discharge Plan and Services Additional resources added to the After Visit Summary for                                       Social Drivers of Health (SDOH) Interventions SDOH Screenings   Food Insecurity: No Food Insecurity (03/26/2024)  Housing: Low Risk  (03/26/2024)  Transportation Needs: Unmet Transportation Needs (03/26/2024)  Utilities: Not At Risk (03/26/2024)  Alcohol Screen: Low Risk  (01/19/2024)  Depression (PHQ2-9): Medium Risk (03/13/2024)  Financial  Resource Strain: Low Risk  (01/19/2024)  Physical Activity: Insufficiently Active (01/19/2024)  Social Connections: Moderately Isolated (03/26/2024)  Stress: No Stress Concern Present (01/19/2024)  Tobacco Use: High Risk (03/26/2024)  Health Literacy: Adequate Health Literacy (11/10/2023)     Readmission Risk Interventions     No data to display

## 2024-03-28 NOTE — Progress Notes (Signed)
SATURATION QUALIFICATIONS: (This note is used to comply with regulatory documentation for home oxygen)  Patient Saturations on Room Air at Rest = 100%  Patient Saturations on Room Air while Ambulating = 98%  Patient Saturations on 0 Liters of oxygen while Ambulating = 98%  Please briefly explain why patient needs home oxygen: 

## 2024-03-28 NOTE — Discharge Instructions (Addendum)
 Please continue sodium tablets three times per day with meals until they are completed.  Follow up with your primary doctor within a week to recheck your sodium level. I recommend you and they talk about a plan to wean off your citalopram  medication since a common side effect is low sodium with these types of medications and may be worsening your status.  I also recommend limiting your alcohol an d fluid intake to about 4 cups per day or less until you see your primary doctor for recheck.   Intensive Outpatient Programs   [facility] [address] [place],  [place], [place] [phone]  [facility] (Inpatient and outpatient)[phone] (Suboxone and Methadone) [address] [phone]  [facility] Programs - Intensive Outpatient [address] [place], [place] [address]  [phone]  [facility] (Outpatient, Inpatient, [facility] (Groups and Residental) (insurance only) [phone] [place], [place] [phone]   [facility] (for caregivers and family) [address] [place] [address] [place], [place] [phone]  Residential Treatment Programs  [facility](2 years) Residential: 60 days)[facility] [address] [place], [place], [place] [phone] or [phone]  [facility] [address] [place], [place], [place], [place] [phone]  [facility] ([facility] [address] [place], [place],  [place] [phone] Admissions: 8am-3pm M-F  [facility]: Residential Program [phone] Days)             [facility]: [place]  [facility], [place], [place]  [phone] or [phone] in Hours over the weekend or by referral)  [facility] [address] [place], [place] [address] [phone] (Do virtual or phone assessment, offer transportation within 25 miles, have in patient and Outpatient options)   [facility] Crisis: [facility]:[phone] (for crisis response 24 hours a day)

## 2024-03-28 NOTE — Progress Notes (Incomplete)
 PROGRESS NOTE Christine Sparks    DOB: 11-25-1960, 63 y.o.  FMW:[id number]    Code Status: Full Code   DOA: 03/26/2024   LOS: 2  Brief hospital course  Christine Sparks is a 63 y.o. female with medical history significant of COPD, depression, DM, HTN, HLD, and ETOH use d/o presenting with SOB, vomiting, diarrhea. Drinks ETOH daily, beer or wine, 2 bottles of wine, last drink day prior to arrival. Not on home O2.  ER Course: sodium 115, serum osmolality 245. hypoxia requiring 2Lnc, WBC 12.9. neg covid, flu, rsv. Blood gas overall within limits save for decreased pO2.  Initial treatment included breathing treatments, steroids, IVF.   Patient was admitted to medicine service for further workup and management of hyponatremia and alcohol withdrawal as outlined in detail below.  03/28/24 -stable, clinically improving  Assessment & Plan  Principal Problem:   Hyponatremia Active Problems:   COPD (chronic obstructive pulmonary disease) (HCC)   Tobacco abuse   Essential hypertension, benign   Mixed hyperlipidemia   Alcohol withdrawal syndrome, with delirium (HCC)  Hyponatremia- Na+ 115>>>>121. S/p short course of hypertonic saline. After fluids stopped her Na began decreasing again so will again give short course of hypertonic saline. Symptoms have improved. CCM has signed off.  - continue Na+ monitoring q6hr - fluid restrict   Alcohol withdrawal- denies hallucination. CIWA this morning 11 - PRN ativan  ordered. If CIWAs remain elevated, consider librium taper. S/p phenobarbitol - supportive care - thiamine - folic acid - TOC consult   N/V/D Uncertain etiology - viral vs. ETOH withdrawal GI pathogen ordered at admission but no BM since arrival   Elevated troponin- chest pain free Normal on presentation, increased from 10 to 34 on repeat so positive delta Likely related to demand ischemia Will trend troponin to peak   COPD- no productive cough, do not suspect need for Abx, steroids. Does not  feel SOB currently Baseline COPD, continues to smoke Not on home O2. Currently on 4L - wean as tolerated - breathing treatments scheduled and PRN  DM- Last A1c was 5.2   HTN Continue ramipril    HLD Continue rosuvastatin   Body mass index is 21.33 kg/m.  VTE ppx: enoxaparin  (LOVENOX ) injection 40 mg Start: 03/26/24 2200  Diet:     Diet   Diet regular Room service appropriate? Yes; Fluid consistency: Thin   Consultants: CCM  Subjective 03/28/24    Pt reports feeling much better. Denies nausea or diarrhea. Has not been out of bed much yet. Tolerated fluids this am.    Objective  Blood pressure 114/78, pulse 76, temperature 98.4 F (36.9 C), temperature source Oral, resp. rate (!) 28, height 5' 2 (1.575 m), weight 52.9 kg, SpO2 97%.  Intake/Output Summary (Last 24 hours) at 03/28/2024 0747 Last data filed at 03/28/2024 0550 Gross per 24 hour  Intake 468.85 ml  Output 800 ml  Net -331.15 ml   Filed Weights   03/26/24 0340 03/26/24 1025  Weight: 55.3 kg 52.9 kg    Physical Exam:  General: awake, alert, NAD HEENT: atraumatic, clear conjunctiva, anicteric sclera, MMM, hearing grossly normal Respiratory: normal respiratory effort. Cardiovascular: quick capillary refill, normal S1/S2, RRR, no JVD, murmurs Gastrointestinal: soft, NT, ND Nervous: A&O x3. no gross focal neurologic deficits, normal speech Extremities: moves all equally, no edema, normal tone Skin: dry, intact, normal temperature, normal color. No rashes, lesions or ulcers on exposed skin Psychiatry: normal mood, congruent affect  Labs   I have personally reviewed the following  labs and imaging studies CBC    Component Value Date/Time   WBC 10.4 03/27/2024 0512   RBC 2.92 (L) 03/27/2024 0512   HGB 9.5 (L) 03/27/2024 0512   HGB 13.9 01/09/2023 1154   HCT 26.8 (L) 03/27/2024 0512   HCT 40.4 01/09/2023 1154   PLT 359 03/27/2024 0512   PLT 464 (H) 11/27/2020 1042   MCV 91.8 03/27/2024 0512   MCV 97  01/09/2023 1154   MCH 32.5 03/27/2024 0512   MCHC 35.4 03/27/2024 0512   RDW 14.4 03/27/2024 0512   RDW 12.3 01/09/2023 1154   LYMPHSABS 0.9 03/26/2024 0345   LYMPHSABS 1.4 01/09/2023 1154   MONOABS 0.7 03/26/2024 0345   EOSABS 0.1 03/26/2024 0345   EOSABS 0.1 01/09/2023 1154   BASOSABS 0.1 03/26/2024 0345   BASOSABS 0.1 01/09/2023 1154      Latest Ref Rng & Units 03/28/2024    4:28 AM 03/28/2024   12:05 AM 03/27/2024    8:33 PM  BMP  Sodium 135 - 145 mmol/L 128  128  124     No results found.   Disposition Plan & Communication  Patient status: Inpatient  Admitted From: Home Planned disposition location: Home Anticipated discharge date: 6/26 pending electrolyte improvement   Family Communication: none at bedside    Author: Marien LITTIE Piety, DO [facility] 03/28/2024, 7:47 AM   Available by Epic secure chat 7AM-7PM. If 7PM-7AM, please contact night-coverage.  [facility] contact information found on [email or web address].

## 2024-03-28 NOTE — Plan of Care (Signed)
  Problem: Nutrition: Goal: Adequate nutrition will be maintained Outcome: Progressing   Problem: Safety: Goal: Ability to remain free from injury will improve Outcome: Progressing   Problem: Coping: Goal: Level of anxiety will decrease Outcome: Progressing

## 2024-03-28 NOTE — Discharge Summary (Signed)
 Physician Discharge Summary  Patient: Christine Sparks FMW:[id number] DOB: 1961/06/10   Code Status: Full Code Admit date: 03/26/2024 Discharge date: 03/28/2024 Disposition: Home, No home health services recommended PCP: Herold Hadassah SQUIBB, MD  Recommendations for Outpatient Follow-up:  Follow up with PCP within 1-2 weeks Regarding general hospital follow up and preventative care Recommend BMP Consider weaning from SSRI due to repeat hyponatremia   Discharge Diagnoses:  Principal Problem:   Hyponatremia Active Problems:   COPD (chronic obstructive pulmonary disease) (HCC)   Tobacco abuse   Essential hypertension, benign   Mixed hyperlipidemia   Alcohol withdrawal syndrome, with delirium (HCC)   Hypomagnesemia   Nausea vomiting and diarrhea   COPD with acute exacerbation [facility])  Brief Hospital Course Summary: Christine Sparks is a 63 y.o. female with medical history significant of COPD, depression, DM, HTN, HLD, and ETOH use d/o presenting with SOB, vomiting, diarrhea. Drinks ETOH daily, beer or wine, 2 bottles of wine, last drink day prior to arrival. ER Course: sodium 115, serum osmolality 245. hypoxia requiring 2Lnc, WBC 12.9. neg covid, flu, rsv. Blood gas overall within normal limits save for decreased pO2.  Initial treatment included breathing treatments, steroids, IVF.    Patient was admitted to medicine service for further workup and management of hyponatremia and alcohol withdrawal as outlined in detail below.  Hyponatremia- Na+ 115>>>128>125. S/p short course of hypertonic saline. After fluids stopped her Na began decreasing again so again gave short course of hypertonic saline. Symptoms have improved. CCM was initially consulted and has signed off.  She was also started on salt tablets and fluid restriction. She was asymptomatic on day of dc.    Alcohol withdrawal- denies hallucination. CIWA monitoring and given PRN ativan  and dose of phenobarbital  - supportive care -  thiamine  - folic acid  - TOC consulted  All other chronic conditions were treated with home medications.    Discharge Condition: Good, improved Recommended discharge diet: Regular healthy diet  Consultations: CCM  Procedures/Studies: None   Allergies as of 03/28/2024       Reactions   Zithromax  [azithromycin ] Nausea And Vomiting   Quetiapine  Other (See Comments)   nightmares   Buspar  [buspirone ] Other (See Comments)   Made her feel crazy        Medication List     TAKE these medications    albuterol  108 (90 Base) MCG/ACT inhaler Commonly known as: VENTOLIN  HFA Inhale 2 puffs into the lungs every 6 (six) hours as needed for shortness of breath.   citalopram  40 MG tablet Commonly known as: CELEXA  Take 1 tablet (40 mg total) by mouth daily.   ipratropium-albuterol  0.5-2.5 (3) MG/3ML Soln Commonly known as: DUONEB Take 3 mLs by nebulization every 6 (six) hours as needed.   LORazepam  0.5 MG tablet Commonly known as: ATIVAN  Take 1 tablet (0.5 mg total) by mouth daily as needed for anxiety.   pantoprazole  40 MG tablet Commonly known as: Protonix  Take 1 tablet (40 mg total) by mouth daily.   ramipril  10 MG capsule Commonly known as: ALTACE  Take 1 capsule (10 mg total) by mouth daily.   rosuvastatin  5 MG tablet Commonly known as: CRESTOR  Take 1 tablet (5 mg total) by mouth daily.   sodium chloride  1 g tablet Take 1 tablet (1 g total) by mouth 3 (three) times daily with meals for 5 days.   Trelegy Ellipta  100-62.5-25 MCG/ACT Aepb Generic drug: Fluticasone -Umeclidin-Vilant Inhale 1 puff into the lungs daily.  Follow-up Information     Herold Hadassah SQUIBB, MD. Schedule an appointment as soon as possible for a visit in 1 week(s).   Specialty: Family Medicine Contact information: [address] [place] [address] [phone]                Subjective   Pt reports feeling well. Tolerating normal diet. No headache, dizziness. No N/V/D.   All  questions and concerns were addressed at time of discharge.  Objective  Blood pressure 123/67, pulse (!) 107, temperature 98.2 F (36.8 C), temperature source Axillary, resp. rate (!) 36, height 5' 2 (1.575 m), weight 52.9 kg, SpO2 92%.   General: Pt is alert, awake, not in acute distress Cardiovascular: RRR, S1/S2 +, no rubs, no gallops Respiratory: CTA bilaterally, no wheezing, no rhonchi Abdominal: Soft, NT, ND, bowel sounds + Extremities: no edema, no cyanosis  The results of significant diagnostics from this hospitalization (including imaging, microbiology, ancillary and laboratory) are listed below for reference.   Imaging studies: DG Chest Portable 1 View Result Date: 03/26/2024 CLINICAL DATA:  Shortness of breath. EXAM: PORTABLE CHEST 1 VIEW COMPARISON:  10/13/2023 FINDINGS: Normal heart size. No pleural fluid, interstitial edema or airspace disease. No pleural fluid, interstitial edema or airspace disease. Lungs are hyperinflated. Coarsened interstitial markings and diffuse bronchial wall thickening. Visualized osseous structures of demonstrate noted acute findings. IMPRESSION: 1. No acute findings. 2. Hyperinflation and diffuse bronchial wall thickening compatible with COPD. Electronically Signed   By: Waddell Calk M.D.   On: 03/26/2024 05:24    Labs: Basic Metabolic Panel: Recent Labs  Lab 03/26/24 0324 03/26/24 0345 03/26/24 0345 03/26/24 0843 03/26/24 1217 03/26/24 1518 03/26/24 1844 03/27/24 0512 03/27/24 0803 03/27/24 1604 03/27/24 2033 03/28/24 0005 03/28/24 0428 03/28/24 0825  NA  --  115*   < > 116* 116* 118*   < > 124*   < > 121* 124* 128* 128* 128*  K  --  4.6  --  5.2* 4.8 4.7  --  4.3  --   --   --   --   --   --   CL  --  83*  --  82* 84* 88*  --  92*  --   --   --   --   --   --   CO2  --  22  --  23 21* 21*  --  24  --   --   --   --   --   --   GLUCOSE  --  109*  --  151* 149* 152*  --  94  --   --   --   --   --   --   BUN  --  <5*  --  6* 7* 7*   --  7*  --   --   --   --   --   --   CREATININE  --  0.42*  --  0.55 0.57 0.46  --  0.54  --   --   --   --   --   --   CALCIUM   --  8.3*  --  8.5* 8.6* 8.3*  --  8.6*  --   --   --   --   --   --   MG 1.6*  --   --   --   --   --   --  2.1  --   --   --   --   --   --  PHOS  --   --   --   --   --   --   --  3.8  --   --   --   --   --   --    < > = values in this interval not displayed.   CBC: Recent Labs  Lab 03/26/24 0345 03/27/24 0512  WBC 12.9* 10.4  NEUTROABS 11.2*  --   HGB 11.6* 9.5*  HCT 31.3* 26.8*  MCV 88.9 91.8  PLT 465* 359   Microbiology: Results for orders placed or performed during the hospital encounter of 03/26/24  Resp panel by RT-PCR (RSV, Flu A&B, Covid) Anterior Nasal Swab     Status: None   Collection Time: 03/26/24  3:45 AM   Specimen: Anterior Nasal Swab  Result Value Ref Range Status   SARS Coronavirus 2 by RT PCR NEGATIVE NEGATIVE Final    Comment: (NOTE) SARS-CoV-2 target nucleic acids are NOT DETECTED.  The SARS-CoV-2 RNA is generally detectable in upper respiratory specimens during the acute phase of infection. The lowest concentration of SARS-CoV-2 viral copies this assay can detect is 138 copies/mL. A negative result does not preclude SARS-Cov-2 infection and should not be used as the sole basis for treatment or other patient management decisions. A negative result may occur with  improper specimen collection/handling, submission of specimen other than nasopharyngeal swab, presence of viral mutation(s) within the areas targeted by this assay, and inadequate number of viral copies(<138 copies/mL). A negative result must be combined with clinical observations, patient history, and epidemiological information. The expected result is Negative.  Fact Sheet for Patients:  BloggerCourse.com  Fact Sheet for Healthcare Providers:  SeriousBroker.it  This test is no t yet approved or cleared by  the [place]  FDA and  has been authorized for detection and/or diagnosis of SARS-CoV-2 by FDA under an Emergency Use Authorization (EUA). This EUA will remain  in effect (meaning this test can be used) for the duration of the COVID-19 declaration under Section 564(b)(1) of the Act, 21 U.S.C.section 360bbb-3(b)(1), unless the authorization is terminated  or revoked sooner.       Influenza A by PCR NEGATIVE NEGATIVE Final   Influenza B by PCR NEGATIVE NEGATIVE Final    Comment: (NOTE) The Xpert Xpress SARS-CoV-2/FLU/RSV plus assay is intended as an aid in the diagnosis of influenza from Nasopharyngeal swab specimens and should not be used as a sole basis for treatment. Nasal washings and aspirates are unacceptable for Xpert Xpress SARS-CoV-2/FLU/RSV testing.  Fact Sheet for Patients: BloggerCourse.com  Fact Sheet for Healthcare Providers: SeriousBroker.it  This test is not yet approved or cleared by the [place]  FDA and has been authorized for detection and/or diagnosis of SARS-CoV-2 by FDA under an Emergency Use Authorization (EUA). This EUA will remain in effect (meaning this test can be used) for the duration of the COVID-19 declaration under Section 564(b)(1) of the Act, 21 U.S.C. section 360bbb-3(b)(1), unless the authorization is terminated or revoked.     Resp Syncytial Virus by PCR NEGATIVE NEGATIVE Final    Comment: (NOTE) Fact Sheet for Patients: BloggerCourse.com  Fact Sheet for Healthcare Providers: SeriousBroker.it  This test is not yet approved or cleared by the [place]  FDA and has been authorized for detection and/or diagnosis of SARS-CoV-2 by FDA under an Emergency Use Authorization (EUA). This EUA will remain in effect (meaning this test can be used) for the duration of the COVID-19 declaration under Section 564(b)(1) of the Act, 21  U.S.C. section 360bbb-3(b)(1), unless the authorization is terminated or revoked.  Performed at [facility], [address] [place], [place] [address]   MRSA Next Gen by PCR, Nasal     Status: None   Collection Time: 03/26/24 10:24 AM   Specimen: Nasal Mucosa; Nasal Swab  Result Value Ref Range Status   MRSA by PCR Next Gen NOT DETECTED NOT DETECTED Final    Comment: (NOTE) The GeneXpert MRSA Assay (FDA approved for NASAL specimens only), is one component of a comprehensive MRSA colonization surveillance program. It is not intended to diagnose MRSA infection nor to guide or monitor treatment for MRSA infections. Test performance is not FDA approved in patients less than 41 years old. Performed at [facility], [address], [place], [place] [address]     Time coordinating discharge: Over 30 minutes  Marien LITTIE Piety, MD  [facility] 03/28/2024, 11:01 AM

## 2024-03-30 LAB — BASIC METABOLIC PANEL WITH GFR
Anion gap: 11 (ref 5–15)
BUN: 6 mg/dL — ABNORMAL LOW (ref 8–23)
CO2: 23 mmol/L (ref 22–32)
Calcium: 8.5 mg/dL — ABNORMAL LOW (ref 8.9–10.3)
Chloride: 82 mmol/L — ABNORMAL LOW (ref 98–111)
Creatinine, Ser: 0.55 mg/dL (ref 0.44–1.00)
GFR, Estimated: >60 mL/min (ref >60)
Glucose, Bld: 151 mg/dL — ABNORMAL HIGH (ref 70–99)
Potassium: 5.2 mmol/L — ABNORMAL HIGH (ref 3.5–5.1)
Sodium: 116 mmol/L — CL (ref 135–145)

## 2024-04-01 ENCOUNTER — Ambulatory Visit: Payer: Self-pay

## 2024-04-01 ENCOUNTER — Encounter: Payer: Self-pay | Admitting: Nurse Practitioner

## 2024-04-01 ENCOUNTER — Telehealth (INDEPENDENT_AMBULATORY_CARE_PROVIDER_SITE_OTHER): Admitting: Nurse Practitioner

## 2024-04-01 ENCOUNTER — Ambulatory Visit: Admitting: Nurse Practitioner

## 2024-04-01 DIAGNOSIS — J441 Chronic obstructive pulmonary disease with (acute) exacerbation: Secondary | ICD-10-CM | POA: Diagnosis not present

## 2024-04-01 MED ORDER — PREDNISONE 20 MG PO TABS
40.0000 mg | ORAL_TABLET | Freq: Every day | ORAL | 0 refills | Status: AC
Start: 2024-04-01 — End: 2024-04-06

## 2024-04-01 MED ORDER — DOXYCYCLINE HYCLATE 100 MG PO TABS: 100.0000 mg | ORAL_TABLET | Freq: Two times a day (BID) | ORAL | 0 refills | Status: AC

## 2024-04-01 NOTE — Assessment & Plan Note (Signed)
 Acute and ongoing.  Will treat with Doxycycline  100 MG BID for 7 days, which she reports tolerance with in past, and Prednisone  40 MG daily for 5 days.  Recommend she continue current inhaler regimen + complete cessation of smoking recommended.  Is returning to see her PCP on 04/03/24, recommend she maintain this visit for lung check.  Strict ER precautions.

## 2024-04-01 NOTE — Progress Notes (Signed)
 There were no vitals taken for this visit.   Subjective:    Patient ID: Christine Sparks, female    DOB: [birth year], 63 y.o.   MRN: [id number]  HPI: Christine Sparks is a 63 y.o. female  Chief Complaint  Patient presents with   Shortness of Breath    Patient states she has been having SOB since 03/26/24. States she was recently in the hospital. States she is also coughing up phlegm.    Virtual Visit via Video Note  I connected with ELIYANA PAGLIARO on 04/01/24 at  1:40 PM EDT by a video enabled telemedicine application and verified that I am speaking with the correct person using two identifiers.  Location: Patient: home Provider: work   I discussed the limitations of evaluation and management by telemedicine and the availability of in person appointments. The patient expressed understanding and agreed to proceed.  I discussed the assessment and treatment plan with the patient. The patient was provided an opportunity to ask questions and all were answered. The patient agreed with the plan and demonstrated an understanding of the instructions.   The patient was advised to call back or seek an in-person evaluation if the symptoms worsen or if the condition fails to improve as anticipated.  I provided 25 minutes of non-face-to-face time during this encounter.   Nanami Whitelaw T Ary Lavine, NP   COPD Was admitted to hospital on 03/26/24 for stomach virus, sodium got too low.  Then her breathing started to act up, was given nebulizer treatments.  Currently uses Trelegy daily and Albuterol  (currently twice a day).  Started feeling SOB and coughing more when she got home.  The heat worsened her SOB. Is a smoker. COPD status: exacerbated Satisfied with current treatment?: yes Oxygen use: no Dyspnea frequency: present more often Cough frequency: present -- coughing but nothing will come up Rescue inhaler frequency:  as above Limitation of activity: yes Productive cough: yellow yesterday Last  Spirometry: unknown Pneumovax: Not up to Date Influenza: Up to Date   Relevant past medical, surgical, family and social history reviewed and updated as indicated. Interim medical history since our last visit reviewed. Allergies and medications reviewed and updated.  Review of Systems  Constitutional:  Positive for fatigue. Negative for activity change, appetite change, diaphoresis and fever.  Respiratory:  Positive for cough, chest tightness, shortness of breath and wheezing.   Cardiovascular:  Negative for chest pain, palpitations and leg swelling.  Gastrointestinal: Negative.   Neurological: Negative.   Psychiatric/Behavioral: Negative.      Per HPI unless specifically indicated above     Objective:    There were no vitals taken for this visit.  Wt Readings from Last 3 Encounters:  03/26/24 116 lb 10 oz (52.9 kg)  03/13/24 116 lb 9.6 oz (52.9 kg)  12/06/23 117 lb 6.4 oz (53.3 kg)    Physical Exam Vitals and nursing note reviewed.  Constitutional:      General: She is awake. She is not in acute distress.    Appearance: She is well-developed. She is not ill-appearing or toxic-appearing.  HENT:     Head: Normocephalic.     Right Ear: Hearing normal.     Left Ear: Hearing normal.   Eyes:     General: Lids are normal.        Right eye: No discharge.        Left eye: No discharge.     Conjunctiva/sclera: Conjunctivae normal.   Pulmonary:  Effort: Pulmonary effort is normal. No accessory muscle usage or respiratory distress.     Comments: Intermittent deep cough noted.  No SOB with talking.  Musculoskeletal:     Cervical back: Normal range of motion.   Neurological:     Mental Status: She is alert and oriented to person, place, and time.   Psychiatric:        Attention and Perception: Attention normal.        Mood and Affect: Mood normal.        Behavior: Behavior normal. Behavior is cooperative.        Thought Content: Thought content normal.        Judgment:  Judgment normal.     Results for orders placed or performed during the hospital encounter of 03/26/24  Magnesium    Collection Time: 03/26/24  3:24 AM  Result Value Ref Range   Magnesium  1.6 (L) 1.7 - 2.4 mg/dL  Resp panel by RT-PCR (RSV, Flu A&B, Covid) Anterior Nasal Swab   Collection Time: 03/26/24  3:45 AM   Specimen: Anterior Nasal Swab  Result Value Ref Range   SARS Coronavirus 2 by RT PCR NEGATIVE NEGATIVE   Influenza A by PCR NEGATIVE NEGATIVE   Influenza B by PCR NEGATIVE NEGATIVE   Resp Syncytial Virus by PCR NEGATIVE NEGATIVE  CBC with Differential/Platelet   Collection Time: 03/26/24  3:45 AM  Result Value Ref Range   WBC 12.9 (H) 4.0 - 10.5 K/uL   RBC 3.52 (L) 3.87 - 5.11 MIL/uL   Hemoglobin 11.6 (L) 12.0 - 15.0 g/dL   HCT 68.6 (L) 63.9 - 53.9 %   MCV 88.9 80.0 - 100.0 fL   MCH 33.0 26.0 - 34.0 pg   MCHC 37.1 (H) 30.0 - 36.0 g/dL   RDW 86.0 88.4 - 84.4 %   Platelets 465 (H) 150 - 400 K/uL   nRBC 0.0 0.0 - 0.2 %   Neutrophils Relative % 87 %   Neutro Abs 11.2 (H) 1.7 - 7.7 K/uL   Lymphocytes Relative 7 %   Lymphs Abs 0.9 0.7 - 4.0 K/uL   Monocytes Relative 5 %   Monocytes Absolute 0.7 0.1 - 1.0 K/uL   Eosinophils Relative 1 %   Eosinophils Absolute 0.1 0.0 - 0.5 K/uL   Basophils Relative 0 %   Basophils Absolute 0.1 0.0 - 0.1 K/uL   Immature Granulocytes 0 %   Abs Immature Granulocytes 0.05 0.00 - 0.07 K/uL  Comprehensive metabolic panel   Collection Time: 03/26/24  3:45 AM  Result Value Ref Range   Sodium 115 (LL) 135 - 145 mmol/L   Potassium 4.6 3.5 - 5.1 mmol/L   Chloride 83 (L) 98 - 111 mmol/L   CO2 22 22 - 32 mmol/L   Glucose, Bld 109 (H) 70 - 99 mg/dL   BUN <5 (L) 8 - 23 mg/dL   Creatinine, Ser 9.57 (L) 0.44 - 1.00 mg/dL   Calcium  8.3 (L) 8.9 - 10.3 mg/dL   Total Protein 5.7 (L) 6.5 - 8.1 g/dL   Albumin 3.3 (L) 3.5 - 5.0 g/dL   AST 30 15 - 41 U/L   ALT 32 0 - 44 U/L   Alkaline Phosphatase 58 38 - 126 U/L   Total Bilirubin 0.7 0.0 - 1.2 mg/dL    GFR, Estimated >39 >39 mL/min   Anion gap 10 5 - 15  Lipase, blood   Collection Time: 03/26/24  3:45 AM  Result Value Ref Range   Lipase 26  11 - 51 U/L  Osmolality   Collection Time: 03/26/24  3:45 AM  Result Value Ref Range   Osmolality 245 (LL) 275 - 295 mOsm/kg  Troponin I (High Sensitivity)   Collection Time: 03/26/24  3:45 AM  Result Value Ref Range   Troponin I (High Sensitivity) 10 <18 ng/L  Troponin I (High Sensitivity)   Collection Time: 03/26/24  6:25 AM  Result Value Ref Range   Troponin I (High Sensitivity) 34 (H) <18 ng/L  Basic metabolic panel   Collection Time: 03/26/24  8:43 AM  Result Value Ref Range   Sodium 116 (LL) 135 - 145 mmol/L   Potassium 5.2 (H) 3.5 - 5.1 mmol/L   Chloride 82 (L) 98 - 111 mmol/L   CO2 23 22 - 32 mmol/L   Glucose, Bld 151 (H) 70 - 99 mg/dL   BUN 6 (L) 8 - 23 mg/dL   Creatinine, Ser 9.44 0.44 - 1.00 mg/dL   Calcium  8.5 (L) 8.9 - 10.3 mg/dL   GFR, Estimated >39 >39 mL/min   Anion gap 11 5 - 15  Blood gas, arterial   Collection Time: 03/26/24  9:17 AM  Result Value Ref Range   pH, Arterial 7.35 7.35 - 7.45   pCO2 arterial 46 32 - 48 mmHg   pO2, Arterial 54 (L) 83 - 108 mmHg   Bicarbonate 25.4 20.0 - 28.0 mmol/L   Acid-base deficit 0.6 0.0 - 2.0 mmol/L   O2 Saturation 87.1 %   Patient temperature 37.0   Urinalysis, Routine w reflex microscopic -Urine, Clean Catch   Collection Time: 03/26/24  9:50 AM  Result Value Ref Range   Color, Urine YELLOW (A) YELLOW   APPearance CLEAR (A) CLEAR   Specific Gravity, Urine 1.012 1.005 - 1.030   pH 6.0 5.0 - 8.0   Glucose, UA NEGATIVE NEGATIVE mg/dL   Hgb urine dipstick SMALL (A) NEGATIVE   Bilirubin Urine NEGATIVE NEGATIVE   Ketones, ur NEGATIVE NEGATIVE mg/dL   Protein, ur NEGATIVE NEGATIVE mg/dL   Nitrite NEGATIVE NEGATIVE   Leukocytes,Ua NEGATIVE NEGATIVE   RBC / HPF 0-5 0 - 5 RBC/hpf   WBC, UA 0-5 0 - 5 WBC/hpf   Bacteria, UA RARE (A) NONE SEEN   Squamous Epithelial / HPF 0-5 0  - 5 /HPF   Mucus PRESENT   Sodium, urine, random   Collection Time: 03/26/24  9:50 AM  Result Value Ref Range   Sodium, Ur 30 mmol/L  Osmolality, urine   Collection Time: 03/26/24  9:50 AM  Result Value Ref Range   Osmolality, Ur 325 300 - 900 mOsm/kg  Urine Drug Screen, Qualitative ([facility] only)   Collection Time: 03/26/24  9:51 AM  Result Value Ref Range   Tricyclic, Ur Screen NONE DETECTED NONE DETECTED   Amphetamines, Ur Screen NONE DETECTED NONE DETECTED   MDMA (Ecstasy)Ur Screen NONE DETECTED NONE DETECTED   Cocaine Metabolite,Ur [place] NONE DETECTED NONE DETECTED   Opiate, Ur Screen NONE DETECTED NONE DETECTED   Phencyclidine (PCP) Ur S NONE DETECTED NONE DETECTED   Cannabinoid 50 Ng, Ur [place] NONE DETECTED NONE DETECTED   Barbiturates, Ur Screen NONE DETECTED NONE DETECTED   Benzodiazepine, Ur Scrn POSITIVE (A) NONE DETECTED   Methadone Scn, Ur NONE DETECTED NONE DETECTED  Glucose, capillary   Collection Time: 03/26/24 10:21 AM  Result Value Ref Range   Glucose-Capillary 160 (H) 70 - 99 mg/dL  MRSA Next Gen by PCR, Nasal   Collection Time: 03/26/24 10:24 AM  Specimen: Nasal Mucosa; Nasal Swab  Result Value Ref Range   MRSA by PCR Next Gen NOT DETECTED NOT DETECTED  Osmolality   Collection Time: 03/26/24 10:43 AM  Result Value Ref Range   Osmolality 244 (LL) 275 - 295 mOsm/kg  Troponin I (High Sensitivity)   Collection Time: 03/26/24 10:43 AM  Result Value Ref Range   Troponin I (High Sensitivity) 64 (H) <18 ng/L  Basic metabolic panel   Collection Time: 03/26/24 12:17 PM  Result Value Ref Range   Sodium 116 (LL) 135 - 145 mmol/L   Potassium 4.8 3.5 - 5.1 mmol/L   Chloride 84 (L) 98 - 111 mmol/L   CO2 21 (L) 22 - 32 mmol/L   Glucose, Bld 149 (H) 70 - 99 mg/dL   BUN 7 (L) 8 - 23 mg/dL   Creatinine, Ser 9.42 0.44 - 1.00 mg/dL   Calcium  8.6 (L) 8.9 - 10.3 mg/dL   GFR, Estimated >39 >39 mL/min   Anion gap 11 5 - 15  Troponin I (High Sensitivity)   Collection Time:  03/26/24 12:17 PM  Result Value Ref Range   Troponin I (High Sensitivity) 76 (H) <18 ng/L  Basic metabolic panel   Collection Time: 03/26/24  3:18 PM  Result Value Ref Range   Sodium 118 (LL) 135 - 145 mmol/L   Potassium 4.7 3.5 - 5.1 mmol/L   Chloride 88 (L) 98 - 111 mmol/L   CO2 21 (L) 22 - 32 mmol/L   Glucose, Bld 152 (H) 70 - 99 mg/dL   BUN 7 (L) 8 - 23 mg/dL   Creatinine, Ser 9.53 0.44 - 1.00 mg/dL   Calcium  8.3 (L) 8.9 - 10.3 mg/dL   GFR, Estimated >39 >39 mL/min   Anion gap 9 5 - 15  Sodium   Collection Time: 03/26/24  6:44 PM  Result Value Ref Range   Sodium 121 (L) 135 - 145 mmol/L  Osmolality, urine   Collection Time: 03/26/24  8:20 PM  Result Value Ref Range   Osmolality, Ur 160 (L) 300 - 900 mOsm/kg  Sodium, urine, random   Collection Time: 03/26/24  8:20 PM  Result Value Ref Range   Sodium, Ur 13 mmol/L  Sodium   Collection Time: 03/26/24 10:19 PM  Result Value Ref Range   Sodium 123 (L) 135 - 145 mmol/L  Sodium   Collection Time: 03/27/24 12:38 AM  Result Value Ref Range   Sodium 123 (L) 135 - 145 mmol/L  Sodium   Collection Time: 03/27/24  2:34 AM  Result Value Ref Range   Sodium 120 (L) 135 - 145 mmol/L  Basic metabolic panel with GFR   Collection Time: 03/27/24  5:12 AM  Result Value Ref Range   Sodium 124 (L) 135 - 145 mmol/L   Potassium 4.3 3.5 - 5.1 mmol/L   Chloride 92 (L) 98 - 111 mmol/L   CO2 24 22 - 32 mmol/L   Glucose, Bld 94 70 - 99 mg/dL   BUN 7 (L) 8 - 23 mg/dL   Creatinine, Ser 9.45 0.44 - 1.00 mg/dL   Calcium  8.6 (L) 8.9 - 10.3 mg/dL   GFR, Estimated >39 >39 mL/min   Anion gap 8 5 - 15  CBC   Collection Time: 03/27/24  5:12 AM  Result Value Ref Range   WBC 10.4 4.0 - 10.5 K/uL   RBC 2.92 (L) 3.87 - 5.11 MIL/uL   Hemoglobin 9.5 (L) 12.0 - 15.0 g/dL   HCT 73.1 (L) 63.9 -  46.0 %   MCV 91.8 80.0 - 100.0 fL   MCH 32.5 26.0 - 34.0 pg   MCHC 35.4 30.0 - 36.0 g/dL   RDW 85.5 88.4 - 84.4 %   Platelets 359 150 - 400 K/uL   nRBC 0.0  0.0 - 0.2 %  Magnesium    Collection Time: 03/27/24  5:12 AM  Result Value Ref Range   Magnesium  2.1 1.7 - 2.4 mg/dL  Phosphorus   Collection Time: 03/27/24  5:12 AM  Result Value Ref Range   Phosphorus 3.8 2.5 - 4.6 mg/dL  Sodium   Collection Time: 03/27/24  8:03 AM  Result Value Ref Range   Sodium 123 (L) 135 - 145 mmol/L  Cortisol-am, blood   Collection Time: 03/27/24  8:03 AM  Result Value Ref Range   Cortisol - AM 1.4 (L) 6.7 - 22.6 ug/dL  TSH   Collection Time: 03/27/24  8:03 AM  Result Value Ref Range   TSH 0.981 0.350 - 4.500 uIU/mL  T3, free   Collection Time: 03/27/24  8:03 AM  Result Value Ref Range   T3, Free 2.3 2.0 - 4.4 pg/mL  T4, free   Collection Time: 03/27/24  8:03 AM  Result Value Ref Range   Free T4 1.07 0.61 - 1.12 ng/dL  Sodium   Collection Time: 03/27/24 12:13 PM  Result Value Ref Range   Sodium 121 (L) 135 - 145 mmol/L  Sodium   Collection Time: 03/27/24  4:04 PM  Result Value Ref Range   Sodium 121 (L) 135 - 145 mmol/L  Sodium   Collection Time: 03/27/24  8:33 PM  Result Value Ref Range   Sodium 124 (L) 135 - 145 mmol/L  Sodium   Collection Time: 03/28/24 12:05 AM  Result Value Ref Range   Sodium 128 (L) 135 - 145 mmol/L  Sodium   Collection Time: 03/28/24  4:28 AM  Result Value Ref Range   Sodium 128 (L) 135 - 145 mmol/L  Sodium   Collection Time: 03/28/24  8:25 AM  Result Value Ref Range   Sodium 128 (L) 135 - 145 mmol/L  Sodium   Collection Time: 03/28/24 12:12 PM  Result Value Ref Range   Sodium 125 (L) 135 - 145 mmol/L      Assessment & Plan:   Problem List Items Addressed This Visit       Respiratory   COPD with acute exacerbation (HCC) - Primary   Acute and ongoing.  Will treat with Doxycycline  100 MG BID for 7 days, which she reports tolerance with in past, and Prednisone  40 MG daily for 5 days.  Recommend she continue current inhaler regimen + complete cessation of smoking recommended.  Is returning to see her PCP on  04/03/24, recommend she maintain this visit for lung check.  Strict ER precautions.      Relevant Medications   predniSONE  (DELTASONE ) 20 MG tablet     Follow up plan: Return for as scheduled in 2 days.

## 2024-04-01 NOTE — Patient Instructions (Signed)
 COPD Exacerbation Learn about triggers that can make COPD worse and how to avoid them. To view the content, go to this web address: [email or web address]  This video will expire on: 02/26/2026. If you need access to this video following this date, please reach out to the healthcare provider who assigned it to you. This information is not intended to replace advice given to you by your health care provider. Make sure you discuss any questions you have with your health care provider. Elsevier Patient Education  [employer].

## 2024-04-01 NOTE — Addendum Note (Signed)
 Addended by: Morgan Keinath T on: 04/01/2024 02:09 PM   Modules accepted: Level of Service

## 2024-04-01 NOTE — Telephone Encounter (Signed)
 FYI Only or Action Required?: FYI only for provider.  Patient was last seen in primary care on 03/13/2024 by Christine Hadassah SQUIBB, MD. Called Nurse Triage reporting Shortness of Breath. Symptoms began 03/26/2024. Interventions attempted: Nothing. Symptoms are: gradually worsening.  Triage Disposition: See PCP When Office is Open (Within 3 Days)  Patient/caregiver understands and will follow disposition?: Yes    Message from [facility] T sent at 04/01/2024  9:37 AM EDT  Heat causing shortness of breath and need prednisone , [phone]   Reason for Disposition  [1] MODERATE longstanding difficulty breathing (e.g., speaks in phrases, SOB even at rest, pulse 100-120) AND [2] SAME as normal  Answer Assessment - Initial Assessment Questions 1. RESPIRATORY STATUS: Describe your breathing? (e.g., wheezing, shortness of breath, unable to speak, severe coughing)      SOB w/exertion, coughing, wheezing 2. ONSET: When did this breathing problem begin?      03/26/2024 and ongoing 3. PATTERN Does the difficult breathing come and go, or has it been constant since it started?      Comes and goes 4. SEVERITY: How bad is your breathing? (e.g., mild, moderate, severe)    - MILD: No SOB at rest, mild SOB with walking, speaks normally in sentences, can lie down, no retractions, pulse < 100.    - MODERATE: SOB at rest, SOB with minimal exertion and prefers to sit, cannot lie down flat, speaks in phrases, mild retractions, audible wheezing, pulse 100-120.    - SEVERE: Very SOB at rest, speaks in single words, struggling to breathe, sitting hunched forward, retractions, pulse > 120      moderate 5. RECURRENT SYMPTOM: Have you had difficulty breathing before? If Yes, ask: When was the last time? and What happened that time?      Yes, na 6. CARDIAC HISTORY: Do you have any history of heart disease? (e.g., heart attack, angina, bypass surgery, angioplasty)      N/a 7. LUNG HISTORY: Do you have any  history of lung disease?  (e.g., pulmonary embolus, asthma, emphysema)     COPD 8. CAUSE: What do you think is causing the breathing problem?      COPD flare up 9. OTHER SYMPTOMS: Do you have any other symptoms? (e.g., dizziness, runny nose, cough, chest pain, fever)     no 10. O2 SATURATION MONITOR:  Do you use an oxygen saturation monitor (pulse oximeter) at home? If Yes, ask: What is your reading (oxygen level) today? What is your usual oxygen saturation reading? (e.g., 95%)       N/a 11. PREGNANCY: Is there any chance you are pregnant? When was your last menstrual period?       N/a 12. TRAVEL: Have you traveled out of the country in the last month? (e.g., travel history, exposures)       N/a  Answer Assessment - Initial Assessment Questions 1. ONSET: When did the cough begin?      03/26/2024 and ongoing 2. SEVERITY: How bad is the cough today?  moderate 3. SPUTUM: Describe the color of your sputum (none, dry cough; clear, white, yellow, green)     yellow 4. HEMOPTYSIS: Are you coughing up any blood? If so ask: How much? (flecks, streaks, tablespoons, etc.)     no 5. DIFFICULTY BREATHING: Are you having difficulty breathing? If Yes, ask: How bad is it? (e.g., mild, moderate, severe)    - MILD: No SOB at rest, mild SOB with walking, speaks normally in sentences, can lie down, no retractions, pulse <  100.    - MODERATE: SOB at rest, SOB with minimal exertion and prefers to sit, cannot lie down flat, speaks in phrases, mild retractions, audible wheezing, pulse 100-120.    - SEVERE: Very SOB at rest, speaks in single words, struggling to breathe, sitting hunched forward, retractions, pulse > 120      .mo 6. FEVER: Do you have a fever? If Yes, ask: What is your temperature, how was it measured, and when did it start?     no 7. CARDIAC HISTORY: Do you have any history of heart disease? (e.g., heart attack, congestive heart failure)      N/a 8. LUNG  HISTORY: Do you have any history of lung disease?  (e.g., pulmonary embolus, asthma, emphysema)     COPD 9. PE RISK FACTORS: Do you have a history of blood clots? (or: recent major surgery, recent prolonged travel, bedridden)     N/a 10. OTHER SYMPTOMS: Do you have any other symptoms? (e.g., runny nose, wheezing, chest pain)       wheezing 11. PREGNANCY: Is there any chance you are pregnant? When was your last menstrual period?       N/a 12. TRAVEL: Have you traveled out of the country in the last month? (e.g., travel history, exposures)       N/a  Protocols used: Breathing Difficulty-A-AH, Cough - Acute Productive-A-AH

## 2024-04-03 ENCOUNTER — Ambulatory Visit (INDEPENDENT_AMBULATORY_CARE_PROVIDER_SITE_OTHER): Admitting: Pediatrics

## 2024-04-03 VITALS — BP 154/80 | HR 88 | Temp 97.7°F | Ht 65.0 in | Wt 118.0 lb

## 2024-04-03 DIAGNOSIS — J441 Chronic obstructive pulmonary disease with (acute) exacerbation: Secondary | ICD-10-CM

## 2024-04-03 DIAGNOSIS — E871 Hypo-osmolality and hyponatremia: Secondary | ICD-10-CM

## 2024-04-03 DIAGNOSIS — F1090 Alcohol use, unspecified, uncomplicated: Secondary | ICD-10-CM

## 2024-04-03 DIAGNOSIS — F419 Anxiety disorder, unspecified: Secondary | ICD-10-CM

## 2024-04-03 DIAGNOSIS — Z09 Encounter for follow-up examination after completed treatment for conditions other than malignant neoplasm: Secondary | ICD-10-CM

## 2024-04-03 DIAGNOSIS — R600 Localized edema: Secondary | ICD-10-CM

## 2024-04-03 DIAGNOSIS — F321 Major depressive disorder, single episode, moderate: Secondary | ICD-10-CM

## 2024-04-03 MED ORDER — NALTREXONE HCL 50 MG PO TABS: 50.0000 mg | ORAL_TABLET | Freq: Every day | ORAL | 2 refills | Status: DC

## 2024-04-03 MED ORDER — MONTELUKAST SODIUM 10 MG PO TABS: 10.0000 mg | ORAL_TABLET | Freq: Every day | ORAL | 3 refills | Status: DC

## 2024-04-03 NOTE — Progress Notes (Signed)
 Transitions of Care Note [facility] Follow Up   Christine Sparks is a 63 y.o. female presenting for Hospital Follow Up (Hospitalization Follow-up (Pt stated--feels somewhat better, but still have coughing.) )  Discussed the use of AI scribe software for clinical note transcription with the patient, who gave verbal consent to proceed.  History of Present Illness   Christine Sparks is a 63 year old female with COPD who presents with a COPD flare and low sodium levels.  She is experiencing a COPD flare characterized by a deep cough and wheezing, which worsened after exposure to heat. She is currently on prednisone , which she started after a virtual visit on Monday, and is using a nebulizer with DuoNeb treatments. She also uses Trelegy and albuterol  as needed, although albuterol  does not provide much relief. She lives alone and finds it difficult to manage her symptoms without assistance.  During her recent hospital stay, she was informed that her sodium levels were critically low, causing significant distress. She was prescribed sodium pills, one gram three times a day, to address this issue. She reports swelling in her feet, which is unusual for her, and is concerned about the impact of her medications, including Celexa , on her sodium levels.  She has a history of alcohol use, consuming about three glasses of wine daily, which she associates with her routine and enjoyment. This habit developed after her retirement. She has a family history of depression and anxiety, which she believes may influence her current situation.  She experienced a stomach bug during her hospital stay, which caused nausea and vomiting. She is unsure of the source of the infection but notes that it was prevalent in the hospital environment. She has a history of anxiety and depression, which she manages with Celexa , although she is uncertain of its effectiveness. Her sleep has been disrupted, particularly due to her recent  illness and prednisone  use.  She has a family history of mental health issues, including depression and anxiety, which she shares with her brother and aunt. Her brother struggled with substance use, and her aunt has a history of depression. 'Bad nerves' run in her family, affecting her mother and grandmother as well.      Summary of Hospitalization Dates of Hospitalization: 03/26/24 until 03/28/2024 Chief reason for hospitalization: hyponatremia   Secondary problems:  - alcohol withdrawal  Follow up recommendations:  - discontinue celexa ,repeat BMP  Called within 2 business days of discharge? no  Brief summary : Presenting with hypoxia and hyponatremia after heavy alcohol use. For further details, please see D/C summary dated : 03/28/24  Interval History Today, the patient reports above.   Medications (current list documented below) 1. New medications prescribed at hospital discharge obtained by the patient? No  If not, the primary barrier is no new meds 2. Patient taking the correct medications? Yes 3. Can patient/caregiver name the medications or carry an updated list? Yes 4. Medications reconciled at this visit? yes  Patient Empowerment and Education 1. Can patient/caregiver explain the reason for hospitalization and the nature of the care provided? yes 2. Can patient/caregiver explain how self-care/care in the home should be provided (BG checks, daily weights, diet restrictions, etc...)? yes 3. Does the patient/caregiver know what signs or symptoms indicate deterioration of their condition? Yes 4.  Does the patient/caregiver know what should be done if these symptoms occur? yes   Additional educational needs were met today as follows: none  Home Status 1. Is the patient independent in  performing ADL's? yes 2. Has the patient experienced a significant decline in physical functioning since hospitalization? no 3. Has the patient experienced a significant decline in cognitive  functioning since hospitalization? no 4. Were Home Health services set up for the patient upon discharge? (Referrals are documented in the Case Manager [facility]) note {NOTES -> FILTERS -> AUTHOR TYPE: Case Manager.) no    Is the patient receiving these services? not applicable 5. Is the patient receiving care management services? no  Communication 1. I have communicated with other members of the patient's care team such as the PCP, inpatient team, specialty physicians, care management, home health agency, or allied health providers about the patient.   Social History Social History   Social History Narrative   Not on file     Substance Use History Tobacco :  reports that she has been smoking cigarettes. She started smoking about 45 years ago. She has a 22.6 pack-year smoking history. She has been exposed to tobacco smoke. She has never used smokeless tobacco.  Alcohol :  reports current alcohol use.  Recreational Drugs :  reports no history of drug use.   Review of Systems ROS  Current Medications: Current Outpatient Medications  Medication Sig Dispense Refill   albuterol  (VENTOLIN  HFA) 108 (90 Base) MCG/ACT inhaler Inhale 2 puffs into the lungs every 6 (six) hours as needed for shortness of breath. 18 g 1   citalopram  (CELEXA ) 40 MG tablet Take 1 tablet (40 mg total) by mouth daily. 90 tablet 3   ipratropium-albuterol  (DUONEB) 0.5-2.5 (3) MG/3ML SOLN Take 3 mLs by nebulization every 6 (six) hours as needed. 360 mL 3   LORazepam  (ATIVAN ) 0.5 MG tablet Take 1 tablet (0.5 mg total) by mouth daily as needed for anxiety. 30 tablet 1   montelukast  (SINGULAIR ) 10 MG tablet Take 1 tablet (10 mg total) by mouth at bedtime. 90 tablet 3   naltrexone  (DEPADE) 50 MG tablet Take 1 tablet (50 mg total) by mouth daily. 30 tablet 2   pantoprazole  (PROTONIX ) 40 MG tablet Take 1 tablet (40 mg total) by mouth daily. 30 tablet 0   ramipril  (ALTACE ) 10 MG capsule Take 1 capsule (10 mg total) by mouth daily.  90 capsule 3   rosuvastatin  (CRESTOR ) 5 MG tablet Take 1 tablet (5 mg total) by mouth daily. 90 tablet 3   TRELEGY ELLIPTA  100-62.5-25 MCG/ACT AEPB Inhale 1 puff into the lungs daily. 60 each 2   No current facility-administered medications for this visit.    Problem List: Patient Active Problem List   Diagnosis Date Noted   Alcohol use disorder 04/10/2024   Hypomagnesemia 03/28/2024   Nausea vomiting and diarrhea 03/28/2024   Panic attacks 11/26/2023   Depression, major, single episode, moderate (HCC) 11/17/2023   Mixed hyperlipidemia 06/01/2023   Cigarette nicotine  dependence without complication 06/01/2023   Hyponatremia 01/10/2023   Diverticulosis 01/10/2023   Allergic rhinitis due to pollen 01/10/2023   Vaginal atrophy 07/07/2016   History of cone biopsy of cervix 07/07/2016   Uterine leiomyoma 07/07/2016   History of endometriosis 07/07/2016   Vitamin D  deficiency 12/20/2013   History of cervical cancer 02/24/2013   B12 deficiency 02/24/2013   COPD (chronic obstructive pulmonary disease) (HCC) 11/07/2011   Anxiety    Tobacco abuse    Essential hypertension, benign    Menopausal syndrome     Objective:   Today's Vitals   04/03/24 1114 04/03/24 1119  BP: (!) 157/75 (!) 154/80  Pulse: 88   Temp:  97.7 F (36.5 C)   SpO2: 99%   Weight: 118 lb (53.5 kg)   Height: 5' 5 (1.651 m)    Body mass index is 19.64 kg/m.  Physical Exam Constitutional:      Appearance: Normal appearance.  HENT:     Head: Normocephalic and atraumatic.  Eyes:     Pupils: Pupils are equal, round, and reactive to light.  Cardiovascular:     Rate and Rhythm: Normal rate and regular rhythm.     Pulses: Normal pulses.     Heart sounds: Normal heart sounds.  Pulmonary:     Effort: Pulmonary effort is normal.     Breath sounds: Normal breath sounds.  Abdominal:     General: Abdomen is flat.     Palpations: Abdomen is soft.  Musculoskeletal:        General: Normal range of motion.      Cervical back: Normal range of motion.     Right lower leg: Edema present.     Left lower leg: Edema present.     Comments: Trace bilateral lower extremity edema  Skin:    General: Skin is warm and dry.     Capillary Refill: Capillary refill takes less than 2 seconds.  Neurological:     General: No focal deficit present.     Mental Status: She is alert. Mental status is at baseline.  Psychiatric:        Mood and Affect: Mood normal.        Behavior: Behavior normal.        Assessment and Plan:   Assessment & Plan   Hospitalization reviewed, medications reviewed. Plan and follow up for acute concerns below. Hospital discharge follow-up Chronic obstructive pulmonary disease with acute exacerbation [facility]) Assessment & Plan: COPD exacerbation in setting of recent hospitalization with persistent symptoms despite current treatment. Evaluated need for pulmonologist referral and potential benefits of montelukast  and pulmonary rehabilitation. - Refer to pulmonologist for further evaluation and management. - Increase DuoNeb treatments to 3-4 times daily. - Continue prednisone  as prescribed. - Start montelukast . - Consider pulmonary rehabilitation.  Orders: -     For home use only DME Nebulizer machine -     Pulmonary Visit -     Montelukast  Sodium; Take 1 tablet (10 mg total) by mouth at bedtime.  Dispense: 90 tablet; Refill: 3  Hyponatremia Assessment & Plan: Critically low sodium levels possibly linked to Celexa  and alcohol use. Discussed medication switch to mirtazapine and monitoring needs. - Order blood work to monitor sodium and electrolytes. - Taper off Celexa  by reducing to 20 mg for one week, then discontinue. - Start mirtazapine after Celexa  is discontinued. - Monitor for edema and consider Lasix if edema persists and labs are stable.  Orders: -     Renal function panel  Alcohol use disorder Assessment & Plan: Daily alcohol consumption potentially affecting sodium  levels and interacting with medications. Discussed risks and treatment options including naltrexone  and AA meetings. - Start naltrexone  to reduce alcohol cravings. - Provide information on local AA meetings and encourage attendance.  Orders: -     Naltrexone  HCl; Take 1 tablet (50 mg total) by mouth daily.  Dispense: 30 tablet; Refill: 2   Lower extremity edema Present since recent copd exacerbation. Plan to check below.  -     Brain natriuretic peptide  Depression, major, single episode, moderate (HCC) Anxiety Assessment & Plan: Current treatment with Celexa  is uncertain in effectiveness. Plan to switch to mirtazapine for  potential benefits on sleep and depression with a lower side effect profile. - Taper off Celexa  as previously described. - Start mirtazapine after Celexa  is discontinued.    Diagnostic Tests, Consultations, and Other Outstanding Issues I have reviewed the following data from this admission:    Discharge summary  : Yes  Discharge/admit labs : Yes  Imaging studies : Yes  Procedures/operative reports : Yes  This plan was discussed with the patient and questions were answered. There were no further concerns. There were no barriers to care. Follow up as indicated, or sooner should any new problems arise, if conditions worsen, or if they are otherwise concerned.    No follow-ups on file.  Future Appointments  Date Time Provider Department Center  04/11/2024 11:15 AM [facility]  05/22/2024  9:30 AM Tamea Dedra CROME, MD [facility] None    Patient Instructions  For your hyponatremia: - we are stopping the celexa  and switching to mirtazapine - take 20mg  (1/2 tab) for 1-2 weeks, once you stop we will start mirtazapine (I will send after you next visit)  We are starting naltrexone  50mg  daily  for alcohol cravings  We are starting montelukast  10mg  for your COPD. I also placed referral for pulmonology referral.

## 2024-04-03 NOTE — Patient Instructions (Addendum)
 For your hyponatremia: - we are stopping the celexa  and switching to mirtazapine - take 20mg  (1/2 tab) for 1-2 weeks, once you stop we will start mirtazapine (I will send after you next visit)  We are starting naltrexone 50mg  daily  for alcohol cravings  We are starting montelukast 10mg  for your COPD. I also placed referral for pulmonology referral.

## 2024-04-04 ENCOUNTER — Telehealth: Payer: Self-pay

## 2024-04-04 LAB — RENAL FUNCTION PANEL
Albumin: 4.2 g/dL (ref 3.9–4.9)
BUN/Creatinine Ratio: 10 — ABNORMAL LOW (ref 12–28)
BUN: 6 mg/dL — ABNORMAL LOW (ref 8–27)
CO2: 22 mmol/L (ref 20–29)
Calcium: 9.8 mg/dL (ref 8.7–10.3)
Chloride: 91 mmol/L — ABNORMAL LOW (ref 96–106)
Creatinine, Ser: 0.6 mg/dL (ref 0.57–1.00)
Glucose: 90 mg/dL (ref 70–99)
Phosphorus: 4.6 mg/dL — ABNORMAL HIGH (ref 3.0–4.3)
Potassium: 5.4 mmol/L — ABNORMAL HIGH (ref 3.5–5.2)
Sodium: 130 mmol/L — ABNORMAL LOW (ref 134–144)
eGFR: 100 mL/min/{1.73_m2} (ref >59)

## 2024-04-04 NOTE — Telephone Encounter (Signed)
 No current note from ordering provider at this time. Once direction received from provider patient will be contacted with further information. If patient returns call prior to our office reaching back out with provider direction please advise that once we do have results commented on by provider we will call her to discuss.

## 2024-04-04 NOTE — Telephone Encounter (Signed)
 Copied from CRM [id number]. Topic: Clinical - Lab/Test Results >> Apr 04, 2024 10:55 AM Silvana PARAS wrote: Reason for CRM: Pt calling to speak with nurse in regards to lab results. Callback number is [phone].

## 2024-04-05 LAB — BRAIN NATRIURETIC PEPTIDE: BNP: 231 pg/mL — ABNORMAL HIGH (ref 0.0–100.0)

## 2024-04-08 ENCOUNTER — Other Ambulatory Visit: Payer: Self-pay | Admitting: Pediatrics

## 2024-04-08 ENCOUNTER — Ambulatory Visit: Payer: Self-pay | Admitting: Pediatrics

## 2024-04-08 DIAGNOSIS — R7989 Other specified abnormal findings of blood chemistry: Secondary | ICD-10-CM

## 2024-04-08 DIAGNOSIS — E875 Hyperkalemia: Secondary | ICD-10-CM

## 2024-04-08 DIAGNOSIS — R600 Localized edema: Secondary | ICD-10-CM

## 2024-04-08 NOTE — Addendum Note (Signed)
 Addended by: Anaja Monts P on: 04/08/2024 04:04 PM   Modules accepted: Orders

## 2024-04-08 NOTE — Progress Notes (Signed)
 SOB in setting of recent COPD exacerbation with LE edema. Elevated BNP, plan to refer to cardiology for echo.  Placed order for repeat blood work.  Christine SHAUNNA Nett, MD

## 2024-04-10 ENCOUNTER — Encounter: Payer: Self-pay | Admitting: Pediatrics

## 2024-04-10 ENCOUNTER — Ambulatory Visit: Admitting: Pediatrics

## 2024-04-10 DIAGNOSIS — F1090 Alcohol use, unspecified, uncomplicated: Secondary | ICD-10-CM | POA: Insufficient documentation

## 2024-04-10 NOTE — Assessment & Plan Note (Signed)
 COPD exacerbation with persistent symptoms despite current treatment. Evaluated need for pulmonologist referral and potential benefits of montelukast  and pulmonary rehabilitation. - Refer to pulmonologist for further evaluation and management. - Increase DuoNeb treatments to 3-4 times daily. - Continue prednisone  as prescribed. - Start montelukast . - Consider pulmonary rehabilitation.

## 2024-04-10 NOTE — Assessment & Plan Note (Signed)
 Daily alcohol consumption potentially affecting sodium levels and interacting with medications. Discussed risks and treatment options including naltrexone  and AA meetings. - Start naltrexone  to reduce alcohol cravings. - Provide information on local AA meetings and encourage attendance.

## 2024-04-10 NOTE — Assessment & Plan Note (Signed)
 Critically low sodium levels possibly linked to Celexa  and alcohol use. Discussed medication switch to mirtazapine and monitoring needs. - Order blood work to monitor sodium and electrolytes. - Taper off Celexa  by reducing to 20 mg for one week, then discontinue. - Start mirtazapine after Celexa  is discontinued. - Monitor for edema and consider Lasix if edema persists and labs are stable.

## 2024-04-10 NOTE — Assessment & Plan Note (Signed)
 Current treatment with Celexa  is uncertain in effectiveness. Plan to switch to mirtazapine for potential benefits on sleep and depression with a lower side effect profile. - Taper off Celexa  as previously described. - Start mirtazapine after Celexa  is discontinued.

## 2024-04-11 ENCOUNTER — Other Ambulatory Visit: Payer: Self-pay | Admitting: Pediatrics

## 2024-04-11 ENCOUNTER — Other Ambulatory Visit

## 2024-04-11 DIAGNOSIS — F419 Anxiety disorder, unspecified: Secondary | ICD-10-CM

## 2024-04-11 DIAGNOSIS — Z1231 Encounter for screening mammogram for malignant neoplasm of breast: Secondary | ICD-10-CM

## 2024-04-11 DIAGNOSIS — F410 Panic disorder [episodic paroxysmal anxiety] without agoraphobia: Secondary | ICD-10-CM

## 2024-04-11 DIAGNOSIS — E875 Hyperkalemia: Secondary | ICD-10-CM

## 2024-04-11 DIAGNOSIS — R600 Localized edema: Secondary | ICD-10-CM

## 2024-04-12 LAB — RENAL FUNCTION PANEL
Albumin: 3.8 g/dL — ABNORMAL LOW (ref 3.9–4.9)
BUN/Creatinine Ratio: 10 — ABNORMAL LOW (ref 12–28)
BUN: 6 mg/dL — ABNORMAL LOW (ref 8–27)
CO2: 23 mmol/L (ref 20–29)
Calcium: 9.3 mg/dL (ref 8.7–10.3)
Chloride: 95 mmol/L — ABNORMAL LOW (ref 96–106)
Creatinine, Ser: 0.61 mg/dL (ref 0.57–1.00)
Glucose: 75 mg/dL (ref 70–99)
Phosphorus: 4 mg/dL (ref 3.0–4.3)
Potassium: 5.1 mmol/L (ref 3.5–5.2)
Sodium: 131 mmol/L — ABNORMAL LOW (ref 134–144)
eGFR: 99 mL/min/1.73 (ref >59)

## 2024-04-12 LAB — BRAIN NATRIURETIC PEPTIDE: BNP: 82.6 pg/mL (ref 0.0–100.0)

## 2024-04-12 NOTE — Telephone Encounter (Signed)
 Requested medications are due for refill today.  no  Requested medications are on the active medications list.  yes  Last refill. 03/13/2024 #30 1 rf  Future visit scheduled.   no  Notes to clinic.  Refill not delegated.    Requested Prescriptions  Pending Prescriptions Disp Refills   LORazepam  (ATIVAN ) 0.5 MG tablet [Pharmacy Med Name: LORAZEPAM  0.5 MG TAB] 30 tablet     Sig: TAKE 1 TABLET BY MOUTH ONCE DAILY AS NEEDED FOR ANXIETY *STOP VALIUM      Not Delegated - Psychiatry: Anxiolytics/Hypnotics 2 Failed - 04/12/2024  3:13 PM      Failed - This refill cannot be delegated      Passed - Urine Drug Screen completed in last 360 days      Passed - Patient is not pregnant      Passed - Valid encounter within last 6 months    Recent Outpatient Visits           1 week ago Chronic obstructive pulmonary disease with acute exacerbation (HCC)   [place] [facility] Herold Hadassah SQUIBB, MD   1 week ago COPD with acute exacerbation [facility])   [place] [facility] [place], [place] T, NP   1 month ago Chronic obstructive pulmonary disease with acute exacerbation [facility])   [place] [facility] Herold Hadassah SQUIBB, MD   2 months ago COPD exacerbation [facility])   [place] [facility] Melvin Pao, NP   4 months ago Essential hypertension, benign   [place] [facility] Herold Hadassah SQUIBB, MD       Future Appointments             In 1 week [place], Cadence H, PA-C [place] [facility] [place]

## 2024-04-15 ENCOUNTER — Ambulatory Visit: Payer: Self-pay | Admitting: Pediatrics

## 2024-04-16 ENCOUNTER — Telehealth: Payer: Self-pay

## 2024-04-16 NOTE — Telephone Encounter (Signed)
 Copied from CRM [id number]. Topic: Clinical - Medication Question >> Apr 16, 2024 10:32 AM Antwanette L wrote: Reason for CRM: Patient is calling to get up a medication that Dr.Santos was suppose to call in. Patient saw Dr. Herold on 7/9. The patient said its anti depressants medication. The patient does not have any symptoms. Please contact the patient at [phone]

## 2024-04-17 NOTE — Telephone Encounter (Signed)
 Per Dr. Lenard note, patient was supposed to taper off the Citalopram .  Has she done this already?

## 2024-04-18 MED ORDER — MIRTAZAPINE 15 MG PO TBDP: 15.0000 mg | ORAL_TABLET | Freq: Every day | ORAL | 0 refills | Status: DC

## 2024-04-18 NOTE — Addendum Note (Signed)
 Addended by: MELVIN PAO on: 04/18/2024 04:27 PM   Modules accepted: Orders

## 2024-04-18 NOTE — Telephone Encounter (Signed)
 Medication sent to the pharmacy.

## 2024-04-22 ENCOUNTER — Ambulatory Visit: Admitting: Medical

## 2024-04-24 NOTE — Progress Notes (Signed)
 She will be in 7/30

## 2024-05-01 ENCOUNTER — Ambulatory Visit: Admitting: Pediatrics

## 2024-05-02 ENCOUNTER — Encounter

## 2024-05-04 ENCOUNTER — Other Ambulatory Visit: Payer: Self-pay | Admitting: Pediatrics

## 2024-05-04 DIAGNOSIS — F410 Panic disorder [episodic paroxysmal anxiety] without agoraphobia: Secondary | ICD-10-CM

## 2024-05-04 DIAGNOSIS — F419 Anxiety disorder, unspecified: Secondary | ICD-10-CM

## 2024-05-06 NOTE — Telephone Encounter (Signed)
 Requested medication (s) are due for refill today -yes  Requested medication (s) are on the active medication list -yes  Future visit scheduled -yes  Last refill: 03/13/24 #30 1RF  Notes to clinic: non delegated Rx  Requested Prescriptions  Pending Prescriptions Disp Refills   LORazepam  (ATIVAN ) 0.5 MG tablet [Pharmacy Med Name: LORAZEPAM  0.5 MG TAB] 30 tablet     Sig: TAKE 1 TABLET BY MOUTH ONCE DAILY AS NEEDED FOR ANXIETY *STOP VALIUM      Not Delegated - Psychiatry: Anxiolytics/Hypnotics 2 Failed - 05/06/2024  4:27 PM      Failed - This refill cannot be delegated      Passed - Urine Drug Screen completed in last 360 days      Passed - Patient is not pregnant      Passed - Valid encounter within last 6 months    Recent Outpatient Visits           1 month ago Chronic obstructive pulmonary disease with acute exacerbation (HCC)   [place] [facility] Herold Hadassah SQUIBB, MD   1 month ago COPD with acute exacerbation (HCC)   [place] [facility] [place], [place] T, NP   1 month ago Chronic obstructive pulmonary disease with acute exacerbation (HCC)   [place] [facility] Herold Hadassah SQUIBB, MD   3 months ago COPD exacerbation [facility])   [place] [facility] Melvin Pao, NP   5 months ago Essential hypertension, benign   [place] [facility] Herold Hadassah SQUIBB, MD       Future Appointments             In 1 month Agbor-Etang, Redell, MD [facility]               Requested Prescriptions  Pending Prescriptions Disp Refills   LORazepam  (ATIVAN ) 0.5 MG tablet [Pharmacy Med Name: LORAZEPAM  0.5 MG TAB] 30 tablet     Sig: TAKE 1 TABLET BY MOUTH ONCE DAILY AS NEEDED FOR ANXIETY *STOP VALIUM      Not Delegated - Psychiatry: Anxiolytics/Hypnotics 2 Failed - 05/06/2024  4:27 PM      Failed - This refill cannot be delegated      Passed - Urine Drug Screen completed in last 360 days       Passed - Patient is not pregnant      Passed - Valid encounter within last 6 months    Recent Outpatient Visits           1 month ago Chronic obstructive pulmonary disease with acute exacerbation (HCC)   [place] [facility] Herold Hadassah SQUIBB, MD   1 month ago COPD with acute exacerbation [facility])   [place] [facility] [place], [place] T, NP   1 month ago Chronic obstructive pulmonary disease with acute exacerbation (HCC)   [place] [facility] Herold Hadassah SQUIBB, MD   3 months ago COPD exacerbation [facility])   [place] [facility] Melvin Pao, NP   5 months ago Essential hypertension, benign   [facility] Herold Hadassah SQUIBB, MD       Future Appointments             In 1 month Agbor-Etang, Redell, MD [facility]

## 2024-05-13 ENCOUNTER — Encounter: Payer: Self-pay | Admitting: Pediatrics

## 2024-05-13 ENCOUNTER — Ambulatory Visit (INDEPENDENT_AMBULATORY_CARE_PROVIDER_SITE_OTHER): Admitting: Pediatrics

## 2024-05-13 VITALS — BP 139/78 | HR 76 | Wt 119.8 lb

## 2024-05-13 DIAGNOSIS — E871 Hypo-osmolality and hyponatremia: Secondary | ICD-10-CM

## 2024-05-13 DIAGNOSIS — F419 Anxiety disorder, unspecified: Secondary | ICD-10-CM | POA: Diagnosis not present

## 2024-05-13 DIAGNOSIS — F1090 Alcohol use, unspecified, uncomplicated: Secondary | ICD-10-CM | POA: Diagnosis not present

## 2024-05-13 DIAGNOSIS — F410 Panic disorder [episodic paroxysmal anxiety] without agoraphobia: Secondary | ICD-10-CM

## 2024-05-13 DIAGNOSIS — Z1211 Encounter for screening for malignant neoplasm of colon: Secondary | ICD-10-CM

## 2024-05-13 DIAGNOSIS — J449 Chronic obstructive pulmonary disease, unspecified: Secondary | ICD-10-CM

## 2024-05-13 DIAGNOSIS — F321 Major depressive disorder, single episode, moderate: Secondary | ICD-10-CM

## 2024-05-13 MED ORDER — LORAZEPAM 0.5 MG PO TABS: 0.5000 mg | ORAL_TABLET | Freq: Two times a day (BID) | ORAL | 0 refills | Status: AC | PRN

## 2024-05-13 MED ORDER — CITALOPRAM HYDROBROMIDE 40 MG PO TABS: 40.0000 mg | ORAL_TABLET | Freq: Every day | ORAL | 2 refills | Status: DC

## 2024-05-13 MED ORDER — LORAZEPAM 0.5 MG PO TABS: 0.5000 mg | ORAL_TABLET | Freq: Two times a day (BID) | ORAL | 0 refills | Status: DC | PRN

## 2024-05-13 NOTE — Assessment & Plan Note (Signed)
 Linked to Celexa  use. Requires monitoring. Recommend 1 less water glass a day (drinking 4 a day). - Monitor sodium levels regularly.

## 2024-05-13 NOTE — Patient Instructions (Addendum)
 Decrease your water intake by 1 glass to see if that helps your sodium  -Increase Ativan  to one in the morning and one at night as needed, try to limit this -Monitor your alcohol use, especially with the increased Ativan . -Consider seeing a psychiatrist if your anxiety does not improve. -Stop taking montelukast  to see if it helps with your anxiety -stop mirtazapine 

## 2024-05-13 NOTE — Assessment & Plan Note (Addendum)
 Anxiety worsened by driving and possibly montelukast . Celexa  and Ativan  are current treatments. Celexa  preferred despite hyponatremia risk. Ativan  increase considered but safety concerns exist. Patient drinking increased when she was discontinued from benzo as well as reduction. It is my hope alcohol will reduce if anxiety well controlled. Will need to sign controlled substance agreement if long term prescribing vs psych managing.  - Increase Ativan  to one in the morning and one at night if needed, instructed to minimize double dose - Monitor alcohol use with Ativan  increase. - Consider psychiatry referral if anxiety persists. - Stop montelukast  to assess impact on anxiety.

## 2024-05-13 NOTE — Assessment & Plan Note (Signed)
 Anxiety may be linked to montelukast  and heat. - Stop montelukast  to assess impact on anxiety. - Consult pulmonologist about home oxygen for heat or anxiety. - Continue Trelegy daily.

## 2024-05-13 NOTE — Assessment & Plan Note (Signed)
 Mirtazapine  not tolerated. Celexa  continued despite hyponatremia risk. - Continue Celexa . - Stop mirtazapine .

## 2024-05-13 NOTE — Assessment & Plan Note (Signed)
 Alcohol use increases with anxiety. Naltrexone  not effective not using. Reduced intake noted, not drinking daily. - Stop naltrexone . - Consider low-dose naltrexone  if desired. - Encourage reduced alcohol use, especially with increased Ativan .

## 2024-05-13 NOTE — Progress Notes (Signed)
 Office Visit  BP 139/78   Pulse 76   Wt 119 lb 12.8 oz (54.3 kg)   SpO2 92%   BMI 19.94 kg/m    Subjective:    Patient ID: DELIAH STREHLOW, female    DOB: 04-13-1961, 63 y.o.   MRN: [id number]  HPI: LENA GORES is a 63 y.o. female  Chief Complaint  Patient presents with   Follow-up    Discussed the use of AI scribe software for clinical note transcription with the patient, who gave verbal consent to proceed.  History of Present Illness   MARIANGEL RINGLEY is a 63 year old female with anxiety and COPD who presents for medication management and follow-up.  She experiences increased anxiety, particularly in the mornings and when driving. The recent heat wave has exacerbated her anxiety, keeping her indoors. She is currently taking Celexa  for anxiety but is concerned about its side effect of low sodium levels. She attempted mirtazapine  but discontinued it due to feeling 'spacey' and discomfort with sublingual administration. She prefers to continue Celexa . She has reduced her alcohol consumption, which she previously used to manage anxiety.  She has a history of COPD and reports generally good breathing, although she experiences increased anxiety related to breathing difficulties during hot weather. She uses Trelegy daily and had been taking montelukast  (Singulair ), but does not feel it has significantly improved her breathing. She has not used DuoNeb recently and does not have oxygen at home.  Recent blood work showed low sodium levels, and her potassium levels have stabilized but are not optimal. She reports decreased swelling. She has not had a colonoscopy or Cologuard test for colon cancer screening and has not had a Pap smear in a long time.  She has reduced her alcohol consumption and has not been attending AA meetings due to the heat. She has been staying indoors more frequently because of the heat wave.        Relevant past medical, surgical, family and social history reviewed  and updated as indicated. Interim medical history since our last visit reviewed. Allergies and medications reviewed and updated.  ROS per HPI unless specifically indicated above     Objective:    BP 139/78   Pulse 76   Wt 119 lb 12.8 oz (54.3 kg)   SpO2 92%   BMI 19.94 kg/m   Wt Readings from Last 3 Encounters:  05/13/24 119 lb 12.8 oz (54.3 kg)  04/03/24 118 lb (53.5 kg)  03/26/24 116 lb 10 oz (52.9 kg)     Physical Exam Constitutional:      Appearance: Normal appearance.  HENT:     Head: Normocephalic and atraumatic.  Eyes:     Pupils: Pupils are equal, round, and reactive to light.  Cardiovascular:     Rate and Rhythm: Normal rate and regular rhythm.     Pulses: Normal pulses.     Heart sounds: Normal heart sounds.  Pulmonary:     Effort: Pulmonary effort is normal.     Breath sounds: Normal breath sounds.  Abdominal:     General: Abdomen is flat.     Palpations: Abdomen is soft.  Musculoskeletal:        General: Normal range of motion.     Cervical back: Normal range of motion.  Skin:    General: Skin is warm and dry.     Capillary Refill: Capillary refill takes less than 2 seconds.  Neurological:     General: No focal deficit  present.     Mental Status: She is alert. Mental status is at baseline.  Psychiatric:        Mood and Affect: Mood normal.        Behavior: Behavior normal.         05/13/2024   10:55 AM 03/13/2024    1:36 PM 01/19/2024    8:56 AM 12/06/2023    1:16 PM 11/10/2023    3:08 PM  Depression screen PHQ 2/9  Decreased Interest 1 1 0 1 2  Down, Depressed, Hopeless 1 1 0 0 2  PHQ - 2 Score 2 2 0 1 4  Altered sleeping 1 2  1 3   Tired, decreased energy 1 2  1 3   Change in appetite 1 2  1 3   Feeling bad or failure about yourself  0 2  0 1  Trouble concentrating 1 0  0 3  Moving slowly or fidgety/restless 0 0  0 0  Suicidal thoughts 0 0  0 0  PHQ-9 Score 6 10  4 17   Difficult doing work/chores    Somewhat difficult        05/13/2024    10:56 AM 03/13/2024    1:36 PM 12/06/2023    1:16 PM 11/10/2023    3:08 PM  GAD 7 : Generalized Anxiety Score  Nervous, Anxious, on Edge 2 1  3   Control/stop worrying 2 1 0 3  Worry too much - different things 2 1 0 2  Trouble relaxing 2 1 1 1   Restless 0 0 0 0  Easily annoyed or irritable 1 1 0 1  Afraid - awful might happen 1 1 0 0  Total GAD 7 Score 10 6  10   Anxiety Difficulty   Somewhat difficult        Assessment & Plan:  Assessment & Plan   Anxiety Panic attacks Assessment & Plan: Anxiety worsened by driving and possibly montelukast . Celexa  and Ativan  are current treatments. Celexa  preferred despite hyponatremia risk. Ativan  increase considered but safety concerns exist. Patient drinking increased when she was discontinued from benzo as well as reduction. It is my hope alcohol will reduce if anxiety well controlled. Will need to sign controlled substance agreement if long term prescribing vs psych managing.  - Increase Ativan  to one in the morning and one at night if needed, instructed to minimize double dose - Monitor alcohol use with Ativan  increase. - Consider psychiatry referral if anxiety persists. - Stop montelukast  to assess impact on anxiety.  Orders: -     Citalopram  Hydrobromide; Take 1 tablet (40 mg total) by mouth daily.  Dispense: 90 tablet; Refill: 2 -     LORazepam ; Take 1 tablet (0.5 mg total) by mouth 2 (two) times daily as needed for up to 14 days for anxiety.  Dispense: 28 tablet; Refill: 0  Chronic obstructive pulmonary disease, unspecified COPD type (HCC) Assessment & Plan: Anxiety may be linked to montelukast  and heat. - Stop montelukast  to assess impact on anxiety. - Consult pulmonologist about home oxygen for heat or anxiety. - Continue Trelegy daily.   Alcohol use disorder Assessment & Plan: Alcohol use increases with anxiety. Naltrexone  not effective not using. Reduced intake noted, not drinking daily. - Stop naltrexone . - Consider low-dose  naltrexone  if desired. - Encourage reduced alcohol use, especially with increased Ativan .  Depression, major, single episode, moderate (HCC) Assessment & Plan: Mirtazapine  not tolerated. Celexa  continued despite hyponatremia risk. - Continue Celexa . - Stop mirtazapine .  Hyponatremia Assessment &  Plan: Linked to Celexa  use. Requires monitoring. Recommend 1 less water glass a day (drinking 4 a day). - Monitor sodium levels regularly.  Orders: -     Basic metabolic panel with GFR -     Magnesium   Screen for colon cancer -     Cologuard   Follow up plan: Return in about 4 weeks (around 06/10/2024) for AWV w pap.  Hadassah SHAUNNA Nett, MD    Today's visit encompasses complex care management of above conditions as part of ongoing care as primary care doctor and longitudinal relationship.

## 2024-05-14 LAB — BASIC METABOLIC PANEL WITH GFR
BUN/Creatinine Ratio: 10 — ABNORMAL LOW (ref 12–28)
BUN: 6 mg/dL — ABNORMAL LOW (ref 8–27)
CO2: 21 mmol/L (ref 20–29)
Calcium: 9.3 mg/dL (ref 8.7–10.3)
Chloride: 87 mmol/L — ABNORMAL LOW (ref 96–106)
Creatinine, Ser: 0.62 mg/dL (ref 0.57–1.00)
Glucose: 77 mg/dL (ref 70–99)
Potassium: 4.9 mmol/L (ref 3.5–5.2)
Sodium: 123 mmol/L — ABNORMAL LOW (ref 134–144)
eGFR: 99 mL/min/1.73 (ref >59)

## 2024-05-14 LAB — MAGNESIUM: Magnesium: 2 mg/dL (ref 1.6–2.3)

## 2024-05-16 ENCOUNTER — Ambulatory Visit: Admitting: Pediatrics

## 2024-05-17 ENCOUNTER — Ambulatory Visit: Admitting: Pediatrics

## 2024-05-17 ENCOUNTER — Other Ambulatory Visit: Payer: Self-pay | Admitting: Pediatrics

## 2024-05-17 ENCOUNTER — Ambulatory Visit: Payer: Self-pay | Admitting: Pediatrics

## 2024-05-17 DIAGNOSIS — E871 Hypo-osmolality and hyponatremia: Secondary | ICD-10-CM

## 2024-05-17 NOTE — Addendum Note (Signed)
 Addended by: HEROLD HADASSAH SQUIBB on: 05/17/2024 12:22 PM   Modules accepted: Orders

## 2024-05-17 NOTE — Progress Notes (Signed)
 Low sodium again. Patient has continued celexa  on her own which suspect may be contributing.

## 2024-05-20 ENCOUNTER — Other Ambulatory Visit

## 2024-05-20 DIAGNOSIS — E871 Hypo-osmolality and hyponatremia: Secondary | ICD-10-CM

## 2024-05-21 LAB — BASIC METABOLIC PANEL WITH GFR
BUN/Creatinine Ratio: 6 — ABNORMAL LOW (ref 12–28)
BUN: 4 mg/dL — ABNORMAL LOW (ref 8–27)
CO2: 22 mmol/L (ref 20–29)
Calcium: 9.3 mg/dL (ref 8.7–10.3)
Chloride: 94 mmol/L — ABNORMAL LOW (ref 96–106)
Creatinine, Ser: 0.63 mg/dL (ref 0.57–1.00)
Glucose: 96 mg/dL (ref 70–99)
Potassium: 5.1 mmol/L (ref 3.5–5.2)
Sodium: 131 mmol/L — ABNORMAL LOW (ref 134–144)
eGFR: 98 mL/min/1.73 (ref >59)

## 2024-05-22 ENCOUNTER — Ambulatory Visit: Admitting: Pulmonary Disease

## 2024-05-22 ENCOUNTER — Ambulatory Visit: Payer: Self-pay | Admitting: Pediatrics

## 2024-06-04 ENCOUNTER — Other Ambulatory Visit: Payer: Self-pay | Admitting: Pediatrics

## 2024-06-04 DIAGNOSIS — F419 Anxiety disorder, unspecified: Secondary | ICD-10-CM

## 2024-06-04 DIAGNOSIS — F410 Panic disorder [episodic paroxysmal anxiety] without agoraphobia: Secondary | ICD-10-CM

## 2024-06-04 NOTE — Telephone Encounter (Signed)
 Requested medication (s) are due for refill today: yes  Requested medication (s) are on the active medication list: yes  Last refill:  05/13/24  Future visit scheduled: yes  Notes to clinic:  Unable to refill per protocol, cannot delegate. Medication not on current list.      Requested Prescriptions  Pending Prescriptions Disp Refills   LORazepam  (ATIVAN ) 0.5 MG tablet [Pharmacy Med Name: LORAZEPAM  0.5 MG TAB] 60 tablet     Sig: TAKE 1 TABLET BY MOUTH TWICE DAILY AS NEEDED FOR ANXIETY     Not Delegated - Psychiatry: Anxiolytics/Hypnotics 2 Failed - 06/04/2024  4:08 PM      Failed - This refill cannot be delegated      Passed - Urine Drug Screen completed in last 360 days      Passed - Patient is not pregnant      Passed - Valid encounter within last 6 months    Recent Outpatient Visits           3 weeks ago Anxiety   [place] [facility] Herold Hadassah SQUIBB, MD   2 months ago Chronic obstructive pulmonary disease with acute exacerbation [facility])   [place] [facility] Herold Hadassah SQUIBB, MD   2 months ago COPD with acute exacerbation [facility])   [place] [facility] [place] T, NP   2 months ago Chronic obstructive pulmonary disease with acute exacerbation [facility])   [place] [facility] Herold Hadassah SQUIBB, MD   4 months ago COPD exacerbation [facility])    [facility] Melvin Pao, NP       Future Appointments             In 1 month Argentina Clap, MD [facility]

## 2024-06-07 ENCOUNTER — Ambulatory Visit: Admitting: Cardiology

## 2024-06-11 ENCOUNTER — Ambulatory Visit: Payer: Self-pay

## 2024-06-11 NOTE — Telephone Encounter (Signed)
 FYI Only or Action Required?: Action required by provider: No available appts. Patient requests virtual appt and prescription for Prednisone . Advised UC/ED, pt declined.  Patient was last seen in primary care on 05/13/2024 by Herold Hadassah SQUIBB, MD.  Called Nurse Triage reporting copd flare up.  Symptoms began several days ago.  Interventions attempted: Prescription medications: Trilogy, Neb Tx.  Symptoms are: gradually worsening.  Triage Disposition: See HCP Within 4 Hours (Or PCP Triage)  Patient/caregiver understands and will follow disposition?: No, wishes to speak with PCP     Copied from CRM #[id number]. Topic: Clinical - Red Word Triage >> Jun 11, 2024  2:37 PM Christine Sparks wrote: Red Word that prompted transfer to Nurse Triage: Has COPD and having a flare up. She says she is having difficulty breathing. Chest is tight and she has been coughing. No other symptoms. Reason for Disposition  [1] MILD difficulty breathing (e.g., minimal/no SOB at rest, SOB with walking, pulse < 100) AND [2] still present when not coughing  (Exception: No change from usual, chronic shortness of breath.)  Answer Assessment - Initial Assessment Questions Patient requesting virtual visit and prescription prednisone . No available appts today, advised UC/ED. Patient declined UC/ED. Patient reports I'm just going to wait to my appt Friday, if I get worse then I'll go.   Trilogy, nebuilzer treatment; not effective. Out of rescue inhaler, Albuterol .  Does not check pulse ox or HR, not on oxygen.  1. ONSET: When did the cough begin?      3 days ago, COPD flare up, shortness of breath,feeling tightness chest, cough 2. SEVERITY: How bad is the cough today?      Moderate; used nebulizer treatment not as effective, 4 hours later it wears off, still having difficulty breathing. Once I start moving around I start have difficulty breathing.Benadryl dryed me up making me cough.  3. SPUTUM: Describe the color of your  sputum (e.g., none, dry cough; clear, white, yellow, green)     White, thick  4. HEMOPTYSIS: Are you coughing up any blood? If so ask: How much? (e.g., flecks, streaks, tablespoons, etc.)     no 5. DIFFICULTY BREATHING: Are you having difficulty breathing? If Yes, ask: How bad is it? (e.g., mild, moderate, severe)      Moderate with movement 6. FEVER: Do you have a fever? If Yes, ask: What is your temperature, how was it measured, and when did it start?     no 7. CARDIAC HISTORY: Do you have any history of heart disease? (e.g., heart attack, congestive heart failure)      High blood pressure 8. LUNG HISTORY: Do you have any history of lung disease?  (e.g., pulmonary embolus, asthma, emphysema)     COPD, no PE 9. PE RISK FACTORS: Do you have a history of blood clots? (or: recent major surgery, recent prolonged travel, bedridden)     no 10. OTHER SYMPTOMS: Do you have any other symptoms? (e.g., runny nose, wheezing, chest pain)       Denies current wheezing. Reports wheezing comes and goes, denies bluish lip/mouth. Patient reports when breath, I can't get full breath in, I can't pull enough medicine out of trilology/tightness, runny nose. Out of albuterol  resue inhaler  12. TRAVEL: Have you traveled out of the country in the last month? (e.g., travel history, exposures)       no  Protocols used: Cough - Chronic-A-AH

## 2024-06-11 NOTE — Telephone Encounter (Signed)
 1508-called [facility] and spoke to [place].

## 2024-06-13 ENCOUNTER — Encounter: Admitting: Pediatrics

## 2024-06-13 ENCOUNTER — Ambulatory Visit: Payer: Self-pay

## 2024-06-13 NOTE — Telephone Encounter (Signed)
 FYI Only or Action Required?: FYI only for provider.  Patient was last seen in primary care on 05/13/2024 by Herold Hadassah SQUIBB, MD.  Called Nurse Triage reporting Cough.  Symptoms began today.  Interventions attempted: Prescription medications: Nebulizer, Trilogy.  Symptoms are: unchanged.  Triage Disposition: See HCP Within 4 Hours (Or PCP Triage)  Patient/caregiver understands and will follow disposition?: Yes  **Video visit (patient preference) scheduled for 9/11 with PCP**         Copied from CRM #[id number]. Topic: Clinical - Red Word Triage >> Jun 13, 2024 11:35 AM Carlyon D wrote: Red Word that prompted transfer to Nurse Triage: COPD flare up  Wheezing real bad  Bad cough Reason for Disposition  Wheezing is present  Answer Assessment - Initial Assessment Questions 1. ONSET: When did the cough begin?      X 1 day  2. SEVERITY: How bad is the cough today?       Becoming more constant   3. SPUTUM: Describe the color of your sputum (e.g., none, dry cough; clear, white, yellow, green)      White  4. HEMOPTYSIS: Are you coughing up any blood? If Yes, ask: How much? (e.g., flecks, streaks, tablespoons, etc.)     No   5. DIFFICULTY BREATHING: Are you having difficulty breathing? If Yes, ask: How bad is it? (e.g., mild, moderate, severe)       COPD, chronic issue   6. FEVER: Do you have a fever? If Yes, ask: What is your temperature, how was it measured, and when did it start?     No   7. CARDIAC HISTORY: Do you have any history of heart disease? (e.g., heart attack, congestive heart failure)      No  8. LUNG HISTORY: Do you have any history of lung disease?  (e.g., pulmonary embolus, asthma, emphysema)     COPD  9. PE RISK FACTORS: Do you have a history of blood clots? (or: recent major surgery, recent prolonged travel, bedridden)     No  10. OTHER SYMPTOMS: Do you have any other symptoms? (e.g., runny nose, wheezing, chest pain)        Wheezing    For home care she is using Trilogy, and nebulizer. Pt. Has an appointment with PCP on 9/12; wants to do a virtual visit.  Protocols used: Cough - Acute Non-Productive-A-AH

## 2024-06-14 ENCOUNTER — Other Ambulatory Visit: Payer: Self-pay | Admitting: Pediatrics

## 2024-06-14 ENCOUNTER — Ambulatory Visit: Admitting: Pediatrics

## 2024-06-14 VITALS — BP 120/72 | HR 91 | Temp 97.4°F

## 2024-06-14 DIAGNOSIS — F17210 Nicotine dependence, cigarettes, uncomplicated: Secondary | ICD-10-CM

## 2024-06-14 DIAGNOSIS — J441 Chronic obstructive pulmonary disease with (acute) exacerbation: Secondary | ICD-10-CM | POA: Diagnosis not present

## 2024-06-14 LAB — POC COVID19/FLU A&B COMBO
Covid Antigen, POC: NEGATIVE
Influenza A Antigen, POC: NEGATIVE
Influenza B Antigen, POC: NEGATIVE

## 2024-06-14 MED ORDER — PREDNISONE 20 MG PO TABS: 40.0000 mg | ORAL_TABLET | Freq: Every day | ORAL | 0 refills | Status: AC

## 2024-06-14 MED ORDER — ALBUTEROL SULFATE HFA 108 (90 BASE) MCG/ACT IN AERS: 2.0000 | INHALATION_SPRAY | Freq: Four times a day (QID) | RESPIRATORY_TRACT | 1 refills | Status: DC | PRN

## 2024-06-14 MED ORDER — DOXYCYCLINE HYCLATE 100 MG PO TABS: 100.0000 mg | ORAL_TABLET | Freq: Two times a day (BID) | ORAL | 0 refills | Status: AC

## 2024-06-14 NOTE — Progress Notes (Signed)
 Office Visit  BP 120/72 (BP Location: Left Arm, Patient Position: Sitting, Cuff Size: Normal)   Pulse 91   Temp (!) 97.4 F (36.3 C) (Oral)   SpO2 93%    Subjective:    Patient ID: Christine Sparks, female    DOB: [birth year], 63 y.o.   MRN: [id number]  HPI: Christine Sparks is a 63 y.o. female  Chief Complaint  Patient presents with   Shortness of Breath   copd flareup    Happened 3 days ago   Medication Refill    Albuterol  and Ramipril     Discussed the use of AI scribe software for clinical note transcription with the patient, who gave verbal consent to proceed.  History of Present Illness   Christine Sparks is a 63 year old female with COPD who presents with shortness of breath.  She has been experiencing shortness of breath for the past three days, which she describes as feeling like her COPD is flaring up. She suspects that the change in weather or a possible viral infection, as her friend and a family member are also ill, might have triggered her symptoms.  She has been using her nebulizer at home and requires an albuterol  inhaler. She had a nebulizer treatment before leaving home today. She experiences significant difficulty catching her breath, to the extent that she needed a wheelchair to assist with mobility due to the exertion of walking.  No fever is present, but she has a cough and shortness of breath. She is not currently coughing up any sputum.  Her pharmacy is [facility], which offers delivery services, but she plans to pick up her medications in person.     Relevant past medical, surgical, family and social history reviewed and updated as indicated. Interim medical history since our last visit reviewed. Allergies and medications reviewed and updated.  ROS per HPI unless specifically indicated above     Objective:    BP 120/72 (BP Location: Left Arm, Patient Position: Sitting, Cuff Size: Normal)   Pulse 91   Temp (!) 97.4 F (36.3 C) (Oral)   SpO2 93%   Wt  Readings from Last 3 Encounters:  05/13/24 119 lb 12.8 oz (54.3 kg)  04/03/24 118 lb (53.5 kg)  03/26/24 116 lb 10 oz (52.9 kg)     Physical Exam Constitutional:      Appearance: Normal appearance.  Pulmonary:     Effort: Pulmonary effort is normal.     Breath sounds: Examination of the right-upper field reveals wheezing and rales. Examination of the left-upper field reveals wheezing and rales. Wheezing and rales present.  Musculoskeletal:        General: Normal range of motion.  Skin:    Comments: Normal skin color  Neurological:     General: No focal deficit present.     Mental Status: She is alert. Mental status is at baseline.  Psychiatric:        Mood and Affect: Mood normal.        Behavior: Behavior normal.        Thought Content: Thought content normal.         06/13/2024    2:01 PM 05/13/2024   10:55 AM 03/13/2024    1:36 PM 01/19/2024    8:56 AM 12/06/2023    1:16 PM  Depression screen PHQ 2/9  Decreased Interest 3 1 1  0 1  Down, Depressed, Hopeless 1 1 1  0 0  PHQ - 2 Score 4 2 2  0 1  Altered sleeping 0 1 2  1   Tired, decreased energy 1 1 2  1   Change in appetite 1 1 2  1   Feeling bad or failure about yourself  0 0 2  0  Trouble concentrating 0 1 0  0  Moving slowly or fidgety/restless 0 0 0  0  Suicidal thoughts 0 0 0  0  PHQ-9 Score 6 6 10  4   Difficult doing work/chores Not difficult at all    Somewhat difficult       06/13/2024    2:04 PM 05/13/2024   10:56 AM 03/13/2024    1:36 PM 12/06/2023    1:16 PM  GAD 7 : Generalized Anxiety Score  Nervous, Anxious, on Edge 0 2 1   Control/stop worrying 0 2 1 0  Worry too much - different things 0 2 1 0  Trouble relaxing 0 2 1 1   Restless 0 0 0 0  Easily annoyed or irritable 0 1 1 0  Afraid - awful might happen 0 1 1 0  Total GAD 7 Score 0 10 6   Anxiety Difficulty Not difficult at all   Somewhat difficult       Assessment & Plan:  Assessment & Plan   COPD with acute exacerbation (HCC) Acute COPD  exacerbation likely due to viral infection. Differential includes influenza or COVID-19. - Prescribe albuterol  inhaler. - Administer DuoNeb every 4-6 hours. - Prescribe prednisone  for 5 days. - Prescribe antibiotics to prevent secondary infection. - Swab for influenza and COVID-19. - Advised ER or urgent care if symptoms worsen over the weekend.  -     Albuterol  Sulfate HFA; Inhale 2 puffs into the lungs every 6 (six) hours as needed for shortness of breath.  Dispense: 18 g; Refill: 1 -     Doxycycline  Hyclate; Take 1 tablet (100 mg total) by mouth 2 (two) times daily for 5 days.  Dispense: 10 tablet; Refill: 0 -     predniSONE ; Take 2 tablets (40 mg total) by mouth daily with breakfast for 5 days.  Dispense: 10 tablet; Refill: 0 -     POC Covid19/Flu A&B Antigen  Follow up plan: Return if symptoms worsen or fail to improve.  Hadassah SHAUNNA Nett, MD

## 2024-06-14 NOTE — Patient Instructions (Signed)
 For you COPD exacerbation: - start doxycycline  100mg  daily for 5 days - prednison 40mg  for 5 days

## 2024-06-17 ENCOUNTER — Telehealth: Payer: Self-pay

## 2024-06-17 NOTE — Telephone Encounter (Unsigned)
 Copied from CRM [id number]. Topic: Clinical - Medical Advice >> Jun 17, 2024 11:34 AM Christine Sparks wrote: No appetite, Nausea and heartburt from the medication. Pt would like to know if something can be presrcibed for the symptoms. Pt would like to know if she tested positive for covid Labs 09/12.

## 2024-06-17 NOTE — Telephone Encounter (Signed)
 Requested medication (s) are due for refill today: routing for review  Requested medication (s) are on the active medication list: no  Last refill:  12/06/23  Future visit scheduled: no  Notes to clinic:  Unable to refill per protocol, Rx expired. Not on current list, possible new Rx needed.      Requested Prescriptions  Pending Prescriptions Disp Refills   NICOTINE  STEP 2 14 MG/24HR patch [Pharmacy Med Name: NICOTINE  STEP 2 14 MG/24HR PAT]      Sig: APPLY 1 PATCH TO CLEAN/DRY HAIRLESS SKINEVERY DAY AT THE SAME TIME. REMOVE OLD PATCHES AND ROTATE SITES OF APPLICATION.     Psychiatry:  Drug Dependence Therapy Passed - 06/17/2024  8:29 AM      Passed - Valid encounter within last 12 months    Recent Outpatient Visits           3 days ago COPD with acute exacerbation [facility])   [place] [facility] Herold Hadassah SQUIBB, MD   4 days ago    [facility] [place], Hadassah SQUIBB, MD   1 month ago Anxiety   [place] [facility] Herold Hadassah SQUIBB, MD   2 months ago Chronic obstructive pulmonary disease with acute exacerbation [facility])   [place] [facility] Herold Hadassah SQUIBB, MD   2 months ago COPD with acute exacerbation [facility])    [facility] Valerio Melanie DASEN, NP       Future Appointments             In 2 weeks Argentina Clap, MD [place] [facility]

## 2024-06-18 ENCOUNTER — Ambulatory Visit: Payer: Self-pay | Admitting: Pediatrics

## 2024-06-19 NOTE — Telephone Encounter (Signed)
 Patient states she needs something for both nausea and heartburn

## 2024-06-20 ENCOUNTER — Other Ambulatory Visit: Payer: Self-pay | Admitting: Pediatrics

## 2024-06-20 DIAGNOSIS — R110 Nausea: Secondary | ICD-10-CM

## 2024-06-20 DIAGNOSIS — K219 Gastro-esophageal reflux disease without esophagitis: Secondary | ICD-10-CM

## 2024-06-20 MED ORDER — FAMOTIDINE 20 MG PO TABS: 20.0000 mg | ORAL_TABLET | Freq: Two times a day (BID) | ORAL | 1 refills | Status: DC

## 2024-06-20 MED ORDER — ONDANSETRON 4 MG PO TBDP: 4.0000 mg | ORAL_TABLET | Freq: Three times a day (TID) | ORAL | 0 refills | Status: DC | PRN

## 2024-06-20 NOTE — Progress Notes (Signed)
 Requesting GERD and nausea med. Will send pepcid  and zofran .  Christine SHAUNNA Nett, MD

## 2024-06-24 ENCOUNTER — Telehealth: Payer: Self-pay

## 2024-06-24 ENCOUNTER — Other Ambulatory Visit (HOSPITAL_COMMUNITY): Payer: Self-pay

## 2024-06-24 NOTE — Telephone Encounter (Signed)
 Pharmacy Patient Advocate Encounter   Received notification from Onbase that prior authorization for Nicotine  Step 2 14 mg/24 hr patches is required/requested.   Insurance verification completed.   The patient is insured through [employer] .   Per test claim: Per test claim, medication is not covered due to plan/benefit exclusion, PA not submitted at this time.  *patient will have to pay out of pocket using good rx.

## 2024-06-26 ENCOUNTER — Other Ambulatory Visit: Payer: Self-pay | Admitting: Pediatrics

## 2024-06-26 ENCOUNTER — Encounter: Payer: Self-pay | Admitting: Pediatrics

## 2024-06-26 DIAGNOSIS — F419 Anxiety disorder, unspecified: Secondary | ICD-10-CM

## 2024-06-26 DIAGNOSIS — F410 Panic disorder [episodic paroxysmal anxiety] without agoraphobia: Secondary | ICD-10-CM

## 2024-06-26 NOTE — Progress Notes (Signed)
 This encounter was created in error - please disregard.

## 2024-06-27 NOTE — Telephone Encounter (Signed)
 Requested medications are due for refill today.  unsure  Requested medications are on the active medications list.  no  Last refill. 05/13/2024  Future visit scheduled.   no  Notes to clinic.  Refill not delegated    Requested Prescriptions  Pending Prescriptions Disp Refills   LORazepam  (ATIVAN ) 0.5 MG tablet [Pharmacy Med Name: LORAZEPAM  0.5 MG TAB] 60 tablet     Sig: TAKE 1 TABLET BY MOUTH TWICE DAILY AS NEEDED FOR ANXIETY     Not Delegated - Psychiatry: Anxiolytics/Hypnotics 2 Failed - 06/27/2024  4:07 PM      Failed - This refill cannot be delegated      Passed - Urine Drug Screen completed in last 360 days      Passed - Patient is not pregnant      Passed - Valid encounter within last 6 months    Recent Outpatient Visits           1 week ago COPD with acute exacerbation (HCC)   [facility] Herold Hadassah SQUIBB, MD   1 month ago Anxiety   [place] [facility] Herold Hadassah SQUIBB, MD   2 months ago Chronic obstructive pulmonary disease with acute exacerbation [facility])   [place] [facility] Herold Hadassah SQUIBB, MD   2 months ago COPD with acute exacerbation [facility])   [place] [facility] [place], [place] T, NP   3 months ago Chronic obstructive pulmonary disease with acute exacerbation [facility])   [place] [facility] Herold Hadassah SQUIBB, MD       Future Appointments             In 1 week Argentina Clap, MD [facility]

## 2024-06-28 ENCOUNTER — Telehealth: Payer: Self-pay

## 2024-06-28 NOTE — Telephone Encounter (Signed)
 Attempted to contact patient left vm for return call

## 2024-06-28 NOTE — Telephone Encounter (Signed)
 Copied from CRM [id number]. Topic: General - Other >> Jun 28, 2024  2:10 PM Fonda T wrote: Reason for CRM: Patient calling, states she missed a call, and returning call, states no voicemail.   Patient advised of chart message response regarding nicotine  patches.   Per patient verbalized understanding, would still like to have prescription sent to pharmacy.  Patient can be reached if need to discuss further, [phone].   [facility] [place], [place] - [address] [place] [address] Phone: [phone] Fax: [phone]

## 2024-07-01 ENCOUNTER — Other Ambulatory Visit: Payer: Self-pay | Admitting: Pediatrics

## 2024-07-01 DIAGNOSIS — F17210 Nicotine dependence, cigarettes, uncomplicated: Secondary | ICD-10-CM

## 2024-07-02 ENCOUNTER — Ambulatory Visit: Admitting: Pulmonary Disease

## 2024-07-03 ENCOUNTER — Other Ambulatory Visit: Payer: Self-pay | Admitting: Pediatrics

## 2024-07-03 DIAGNOSIS — F419 Anxiety disorder, unspecified: Secondary | ICD-10-CM

## 2024-07-03 DIAGNOSIS — F410 Panic disorder [episodic paroxysmal anxiety] without agoraphobia: Secondary | ICD-10-CM

## 2024-07-03 NOTE — Telephone Encounter (Signed)
 Requested medication (s) are due for refill today: yes  Requested medication (s) are on the active medication list: no  Last refill:  12/06/23  Future visit scheduled: no  Notes to clinic:  Unable to refill per protocol, Rx expired.Not on current list.      Requested Prescriptions  Pending Prescriptions Disp Refills   NICOTINE  STEP 3 7 MG/24HR patch [Pharmacy Med Name: NICOTINE  STEP 3 7 MG/24HR PAT]      Sig: APPLY 1 PATCH TO CLEAN/DRY HAIRLESS SKINEVERY DAY AT THE SAME TIME. REMOVE OLD PATCHES AND ROTATE SITES OF APPLICATION.     Psychiatry:  Drug Dependence Therapy Passed - 07/03/2024  7:57 AM      Passed - Valid encounter within last 12 months    Recent Outpatient Visits           2 weeks ago COPD with acute exacerbation [facility])   [place] [facility] Herold Hadassah SQUIBB, MD   1 month ago Anxiety   [place] [facility] Herold Hadassah SQUIBB, MD   3 months ago Chronic obstructive pulmonary disease with acute exacerbation [facility])   [place] [facility] Herold Hadassah SQUIBB, MD   3 months ago COPD with acute exacerbation [facility])   [place] [facility] [place], Melanie T, NP   3 months ago Chronic obstructive pulmonary disease with acute exacerbation [facility])   [place] [facility] Herold Hadassah SQUIBB, MD       Future Appointments             In 2 days Argentina Clap, MD [facility] at [facility]

## 2024-07-03 NOTE — Progress Notes (Deleted)
  Cardiology Office Note   Date:  07/03/2024  ID:  Karlie, Aung [birth year], MRN [id number] PCP: Herold Hadassah SQUIBB, MD  [facility] Providers Cardiologist:  None { Click to update primary MD,subspecialty MD or APP then REFRESH:1}    History of Present Illness Christine Sparks is a 63 y.o. female PMH COPD, HTN, HLD, alcohol dependence who presents for further evaluation and management of lower extremity edema and elevated BNP.  Patient has recently been treated for multiple COPD exacerbations.  During 1 of these she was noted to have an elevated BNP to 230 which then down trended to 80s.  There was also lower extremity edema present.  Last LDL 59 01/2023.  Relevant CVD History -CT chest 10/2023 with significant aortic atherosclerosis and three-vessel CAC -TTE 09/20/2023 LVEF 60 to 65% with indeterminate diastolic function.  Normal RV size and function.  No significant valvular disease.    ROS: Pt denies any chest discomfort, jaw pain, arm pain, palpitations, syncope, presyncope, orthopnea, PND, or LE edema.  Studies Reviewed I have independently reviewed the patient's ECG, recent medical records, recent blood work, and previous cardiac studies.  Physical Exam VS:  There were no vitals taken for this visit.       Wt Readings from Last 3 Encounters:  05/13/24 119 lb 12.8 oz (54.3 kg)  04/03/24 118 lb (53.5 kg)  03/26/24 116 lb 10 oz (52.9 kg)    GEN: No acute distress. NECK: No JVD; No carotid bruits. CARDIAC: ***RRR, no murmurs, rubs, gallops. RESPIRATORY:  Clear to auscultation. EXTREMITIES:  Warm and well-perfused. No edema.  ASSESSMENT AND PLAN Elevated BNP LE edema Aortic atherosclerosis CAC        {Are you ordering a CV Procedure (e.g. stress test, cath, DCCV, TEE, etc)?   Press F2        :[id number]}  Dispo: ***  Signed, Caron Poser, MD

## 2024-07-03 NOTE — Telephone Encounter (Signed)
 Completed.

## 2024-07-04 NOTE — Telephone Encounter (Signed)
 Requested medications are due for refill today.  no  Requested medications are on the active medications list.  yes  Last refill. 07/03/2024  Future visit scheduled.   no  Notes to clinic.  Refusal not delegated.    Requested Prescriptions  Pending Prescriptions Disp Refills   LORazepam  (ATIVAN ) 0.5 MG tablet [Pharmacy Med Name: LORAZEPAM  0.5 MG TAB] 60 tablet     Sig: TAKE 1 TABLET BY MOUTH TWICE DAILY AS NEEDED FOR ANXIETY     Not Delegated - Psychiatry: Anxiolytics/Hypnotics 2 Failed - 07/04/2024  3:10 PM      Failed - This refill cannot be delegated      Passed - Urine Drug Screen completed in last 360 days      Passed - Patient is not pregnant      Passed - Valid encounter within last 6 months    Recent Outpatient Visits           2 weeks ago COPD with acute exacerbation (HCC)   [place] [facility] Herold Hadassah SQUIBB, MD   1 month ago Anxiety   [place] [facility] Herold Hadassah SQUIBB, MD   3 months ago Chronic obstructive pulmonary disease with acute exacerbation [facility])   [place] [facility] Herold Hadassah SQUIBB, MD   3 months ago COPD with acute exacerbation [facility])   [place] [facility] [place], [place] T, NP   3 months ago Chronic obstructive pulmonary disease with acute exacerbation [facility])   [place] [facility] Herold Hadassah SQUIBB, MD       Future Appointments             In 3 weeks Argentina Clap, MD [facility] at [facility]

## 2024-07-05 ENCOUNTER — Ambulatory Visit

## 2024-07-24 ENCOUNTER — Encounter: Payer: Self-pay | Admitting: Emergency Medicine

## 2024-07-24 ENCOUNTER — Other Ambulatory Visit: Payer: Self-pay

## 2024-07-24 ENCOUNTER — Emergency Department

## 2024-07-24 ENCOUNTER — Inpatient Hospital Stay
Admission: EM | Admit: 2024-07-24 | Discharge: 2024-08-01 | DRG: 643 | Disposition: A | Discharge status: Home Health | Attending: Osteopathic Medicine | Admitting: Osteopathic Medicine

## 2024-07-24 DIAGNOSIS — E871 Hypo-osmolality and hyponatremia: Secondary | ICD-10-CM | POA: Diagnosis present

## 2024-07-24 DIAGNOSIS — F17210 Nicotine dependence, cigarettes, uncomplicated: Secondary | ICD-10-CM | POA: Diagnosis present

## 2024-07-24 DIAGNOSIS — Z7951 Long term (current) use of inhaled steroids: Secondary | ICD-10-CM

## 2024-07-24 DIAGNOSIS — S2242XA Multiple fractures of ribs, left side, initial encounter for closed fracture: Secondary | ICD-10-CM | POA: Diagnosis present

## 2024-07-24 DIAGNOSIS — I252 Old myocardial infarction: Secondary | ICD-10-CM

## 2024-07-24 DIAGNOSIS — Z881 Allergy status to other antibiotic agents status: Secondary | ICD-10-CM

## 2024-07-24 DIAGNOSIS — W19XXXA Unspecified fall, initial encounter: Secondary | ICD-10-CM | POA: Diagnosis present

## 2024-07-24 DIAGNOSIS — A09 Infectious gastroenteritis and colitis, unspecified: Secondary | ICD-10-CM | POA: Diagnosis present

## 2024-07-24 DIAGNOSIS — I5031 Acute diastolic (congestive) heart failure: Secondary | ICD-10-CM | POA: Diagnosis not present

## 2024-07-24 DIAGNOSIS — J441 Chronic obstructive pulmonary disease with (acute) exacerbation: Secondary | ICD-10-CM | POA: Diagnosis present

## 2024-07-24 DIAGNOSIS — Z8249 Family history of ischemic heart disease and other diseases of the circulatory system: Secondary | ICD-10-CM | POA: Diagnosis not present

## 2024-07-24 DIAGNOSIS — J9601 Acute respiratory failure with hypoxia: Secondary | ICD-10-CM | POA: Diagnosis present

## 2024-07-24 DIAGNOSIS — Z888 Allergy status to other drugs, medicaments and biological substances status: Secondary | ICD-10-CM

## 2024-07-24 DIAGNOSIS — I110 Hypertensive heart disease with heart failure: Secondary | ICD-10-CM | POA: Diagnosis present

## 2024-07-24 DIAGNOSIS — T502X5A Adverse effect of carbonic-anhydrase inhibitors, benzothiadiazides and other diuretics, initial encounter: Secondary | ICD-10-CM | POA: Diagnosis not present

## 2024-07-24 DIAGNOSIS — F1020 Alcohol dependence, uncomplicated: Secondary | ICD-10-CM | POA: Diagnosis present

## 2024-07-24 DIAGNOSIS — M4802 Spinal stenosis, cervical region: Secondary | ICD-10-CM | POA: Diagnosis present

## 2024-07-24 DIAGNOSIS — Z1152 Encounter for screening for COVID-19: Secondary | ICD-10-CM | POA: Diagnosis not present

## 2024-07-24 DIAGNOSIS — K828 Other specified diseases of gallbladder: Secondary | ICD-10-CM | POA: Diagnosis present

## 2024-07-24 DIAGNOSIS — E876 Hypokalemia: Secondary | ICD-10-CM | POA: Diagnosis not present

## 2024-07-24 DIAGNOSIS — D75839 Thrombocytosis, unspecified: Secondary | ICD-10-CM | POA: Diagnosis present

## 2024-07-24 DIAGNOSIS — Z6841 Body Mass Index (BMI) 40.0 and over, adult: Secondary | ICD-10-CM | POA: Diagnosis not present

## 2024-07-24 DIAGNOSIS — E7800 Pure hypercholesterolemia, unspecified: Secondary | ICD-10-CM | POA: Diagnosis present

## 2024-07-24 DIAGNOSIS — E222 Syndrome of inappropriate secretion of antidiuretic hormone: Secondary | ICD-10-CM | POA: Diagnosis present

## 2024-07-24 DIAGNOSIS — F419 Anxiety disorder, unspecified: Secondary | ICD-10-CM | POA: Diagnosis present

## 2024-07-24 DIAGNOSIS — E119 Type 2 diabetes mellitus without complications: Secondary | ICD-10-CM | POA: Diagnosis present

## 2024-07-24 DIAGNOSIS — R531 Weakness: Secondary | ICD-10-CM

## 2024-07-24 DIAGNOSIS — E860 Dehydration: Secondary | ICD-10-CM | POA: Diagnosis present

## 2024-07-24 DIAGNOSIS — Z79899 Other long term (current) drug therapy: Secondary | ICD-10-CM | POA: Diagnosis not present

## 2024-07-24 DIAGNOSIS — Y92009 Unspecified place in unspecified non-institutional (private) residence as the place of occurrence of the external cause: Secondary | ICD-10-CM | POA: Diagnosis not present

## 2024-07-24 DIAGNOSIS — E782 Mixed hyperlipidemia: Secondary | ICD-10-CM

## 2024-07-24 LAB — URINALYSIS, W/ REFLEX TO CULTURE (INFECTION SUSPECTED)
Bilirubin Urine: NEGATIVE
Glucose, UA: NEGATIVE mg/dL
Ketones, ur: NEGATIVE mg/dL
Leukocytes,Ua: NEGATIVE
Nitrite: NEGATIVE
Protein, ur: NEGATIVE mg/dL
Specific Gravity, Urine: 1.041 — ABNORMAL HIGH (ref 1.005–1.030)
pH: 6 (ref 5.0–8.0)

## 2024-07-24 LAB — COMPREHENSIVE METABOLIC PANEL WITH GFR
ALT: 37 U/L (ref 0–44)
AST: 49 U/L — ABNORMAL HIGH (ref 15–41)
Albumin: 3.8 g/dL (ref 3.5–5.0)
Alkaline Phosphatase: 88 U/L (ref 38–126)
Anion gap: 10 (ref 5–15)
BUN: <5 mg/dL — ABNORMAL LOW (ref 8–23)
CO2: 26 mmol/L (ref 22–32)
Calcium: 8.6 mg/dL — ABNORMAL LOW (ref 8.9–10.3)
Chloride: 86 mmol/L — ABNORMAL LOW (ref 98–111)
Creatinine, Ser: 0.59 mg/dL (ref 0.44–1.00)
GFR, Estimated: >60 mL/min (ref >60)
Glucose, Bld: 102 mg/dL — ABNORMAL HIGH (ref 70–99)
Potassium: 4.1 mmol/L (ref 3.5–5.1)
Sodium: 122 mmol/L — ABNORMAL LOW (ref 135–145)
Total Bilirubin: 0.7 mg/dL (ref 0.0–1.2)
Total Protein: 6.8 g/dL (ref 6.5–8.1)

## 2024-07-24 LAB — CBC WITH DIFFERENTIAL/PLATELET
Abs Immature Granulocytes: 0.05 K/uL (ref 0.00–0.07)
Basophils Absolute: 0 K/uL (ref 0.0–0.1)
Basophils Relative: 0 %
Eosinophils Absolute: 0 K/uL (ref 0.0–0.5)
Eosinophils Relative: 0 %
HCT: 38.3 % (ref 36.0–46.0)
Hemoglobin: 13.8 g/dL (ref 12.0–15.0)
Immature Granulocytes: 1 %
Lymphocytes Relative: 14 %
Lymphs Abs: 1.5 K/uL (ref 0.7–4.0)
MCH: 31.9 pg (ref 26.0–34.0)
MCHC: 36 g/dL (ref 30.0–36.0)
MCV: 88.7 fL (ref 80.0–100.0)
Monocytes Absolute: 0.5 K/uL (ref 0.1–1.0)
Monocytes Relative: 5 %
Neutro Abs: 8.3 K/uL — ABNORMAL HIGH (ref 1.7–7.7)
Neutrophils Relative %: 80 %
Platelets: 447 K/uL — ABNORMAL HIGH (ref 150–400)
RBC: 4.32 MIL/uL (ref 3.87–5.11)
RDW: 11.8 % (ref 11.5–15.5)
WBC: 10.4 K/uL (ref 4.0–10.5)
nRBC: 0 % (ref 0.0–0.2)

## 2024-07-24 LAB — TROPONIN I (HIGH SENSITIVITY)
Troponin I (High Sensitivity): 3 ng/L (ref <18)
Troponin I (High Sensitivity): 4 ng/L (ref <18)

## 2024-07-24 LAB — RESP PANEL BY RT-PCR (RSV, FLU A&B, COVID)  RVPGX2
Influenza A by PCR: NEGATIVE
Influenza B by PCR: NEGATIVE
Resp Syncytial Virus by PCR: NEGATIVE
SARS Coronavirus 2 by RT PCR: NEGATIVE

## 2024-07-24 LAB — URINE DRUG SCREEN, QUALITATIVE (ARMC ONLY)
Amphetamines, Ur Screen: NOT DETECTED
Barbiturates, Ur Screen: NOT DETECTED
Benzodiazepine, Ur Scrn: POSITIVE — AB
Cannabinoid 50 Ng, Ur ~~LOC~~: NOT DETECTED
Cocaine Metabolite,Ur ~~LOC~~: NOT DETECTED
MDMA (Ecstasy)Ur Screen: NOT DETECTED
Methadone Scn, Ur: NOT DETECTED
Opiate, Ur Screen: NOT DETECTED
Phencyclidine (PCP) Ur S: NOT DETECTED
Tricyclic, Ur Screen: NOT DETECTED

## 2024-07-24 LAB — VITAMIN B12: Vitamin B-12: 221 pg/mL (ref 180–914)

## 2024-07-24 LAB — VITAMIN D 25 HYDROXY (VIT D DEFICIENCY, FRACTURES): Vit D, 25-Hydroxy: 48.82 ng/mL (ref 30–100)

## 2024-07-24 LAB — LIPASE, BLOOD: Lipase: 27 U/L (ref 11–51)

## 2024-07-24 LAB — PHOSPHORUS: Phosphorus: 4.1 mg/dL (ref 2.5–4.6)

## 2024-07-24 LAB — OSMOLALITY: Osmolality: 262 mosm/kg — ABNORMAL LOW (ref 275–295)

## 2024-07-24 LAB — MAGNESIUM: Magnesium: 1.8 mg/dL (ref 1.7–2.4)

## 2024-07-24 MED ORDER — ACETAMINOPHEN 325 MG PO TABS
650.0000 mg | ORAL_TABLET | Freq: Four times a day (QID) | ORAL | Status: DC | PRN
  Administered 2024-07-24: 650 mg via ORAL
  Filled 2024-07-24: qty 2

## 2024-07-24 MED ORDER — IOHEXOL 300 MG/ML  SOLN
80.0000 mL | Freq: Once | INTRAMUSCULAR | Status: AC | PRN
Start: 2024-07-24 — End: 2024-07-24
  Administered 2024-07-24: 80 mL via INTRAVENOUS

## 2024-07-24 MED ORDER — ONDANSETRON HCL 4 MG/2ML IJ SOLN
4.0000 mg | Freq: Four times a day (QID) | INTRAMUSCULAR | Status: DC | PRN
  Administered 2024-07-24 – 2024-08-01 (×3): 4 mg via INTRAVENOUS
  Filled 2024-07-24 (×3): qty 2

## 2024-07-24 MED ORDER — LORAZEPAM 2 MG/ML IJ SOLN
1.0000 mg | Freq: Once | INTRAMUSCULAR | Status: AC
  Administered 2024-07-24: 1 mg via INTRAVENOUS
  Filled 2024-07-24: qty 1

## 2024-07-24 MED ORDER — ADULT MULTIVITAMIN W/MINERALS CH
1.0000 | ORAL_TABLET | Freq: Every day | ORAL | Status: DC
  Administered 2024-07-24 – 2024-08-01 (×9): 1 via ORAL
  Filled 2024-07-24 (×9): qty 1

## 2024-07-24 MED ORDER — ENOXAPARIN SODIUM 40 MG/0.4ML IJ SOSY
40.0000 mg | PREFILLED_SYRINGE | INTRAMUSCULAR | Status: DC
  Administered 2024-07-24 – 2024-08-01 (×9): 40 mg via SUBCUTANEOUS
  Filled 2024-07-24 (×9): qty 0.4

## 2024-07-24 MED ORDER — PANTOPRAZOLE SODIUM 40 MG PO TBEC
40.0000 mg | DELAYED_RELEASE_TABLET | Freq: Two times a day (BID) | ORAL | Status: DC
  Administered 2024-07-24 – 2024-08-01 (×16): 40 mg via ORAL
  Filled 2024-07-24 (×17): qty 1

## 2024-07-24 MED ORDER — HALOPERIDOL LACTATE 5 MG/ML IJ SOLN
2.0000 mg | Freq: Four times a day (QID) | INTRAMUSCULAR | Status: DC | PRN
  Administered 2024-07-24: 2 mg via INTRAVENOUS
  Filled 2024-07-24: qty 1

## 2024-07-24 MED ORDER — SODIUM CHLORIDE 0.9 % IV SOLN: INTRAVENOUS | Status: DC

## 2024-07-24 MED ORDER — IPRATROPIUM-ALBUTEROL 0.5-2.5 (3) MG/3ML IN SOLN
3.0000 mL | RESPIRATORY_TRACT | Status: AC
  Administered 2024-07-24 (×2): 3 mL via RESPIRATORY_TRACT
  Filled 2024-07-24 (×2): qty 3

## 2024-07-24 MED ORDER — OXYCODONE HCL 5 MG PO TABS
5.0000 mg | ORAL_TABLET | ORAL | Status: DC | PRN
  Administered 2024-07-24 – 2024-07-26 (×2): 5 mg via ORAL
  Filled 2024-07-24 (×3): qty 1

## 2024-07-24 MED ORDER — SODIUM CHLORIDE 0.9% FLUSH
3.0000 mL | Freq: Two times a day (BID) | INTRAVENOUS | Status: DC
  Administered 2024-07-24 – 2024-08-01 (×17): 3 mL via INTRAVENOUS

## 2024-07-24 MED ORDER — BUDESON-GLYCOPYRROL-FORMOTEROL 160-9-4.8 MCG/ACT IN AERO
2.0000 | INHALATION_SPRAY | Freq: Two times a day (BID) | RESPIRATORY_TRACT | Status: DC
  Administered 2024-07-24 (×2): 2 via RESPIRATORY_TRACT
  Filled 2024-07-24 (×2): qty 5.9

## 2024-07-24 MED ORDER — SODIUM CHLORIDE 0.9% FLUSH
3.0000 mL | INTRAVENOUS | Status: DC | PRN
  Administered 2024-07-25: 3 mL via INTRAVENOUS

## 2024-07-24 MED ORDER — SODIUM CHLORIDE 0.9 % IV SOLN: 250.0000 mL | INTRAVENOUS | Status: AC | PRN

## 2024-07-24 MED ORDER — ONDANSETRON HCL 4 MG/2ML IJ SOLN
4.0000 mg | Freq: Once | INTRAMUSCULAR | Status: AC
  Administered 2024-07-24: 4 mg via INTRAVENOUS
  Filled 2024-07-24: qty 2

## 2024-07-24 MED ORDER — LORAZEPAM 0.5 MG PO TABS
0.5000 mg | ORAL_TABLET | Freq: Two times a day (BID) | ORAL | Status: DC | PRN
  Administered 2024-07-24 – 2024-07-31 (×5): 0.5 mg via ORAL
  Filled 2024-07-24 (×4): qty 1

## 2024-07-24 MED ORDER — LORAZEPAM 2 MG/ML IJ SOLN
1.0000 mg | INTRAMUSCULAR | Status: AC | PRN
  Administered 2024-07-24 – 2024-07-25 (×3): 2 mg via INTRAVENOUS
  Filled 2024-07-24 (×3): qty 1

## 2024-07-24 MED ORDER — ONDANSETRON HCL 4 MG PO TABS
4.0000 mg | ORAL_TABLET | Freq: Four times a day (QID) | ORAL | Status: DC | PRN
  Administered 2024-07-26 – 2024-07-30 (×3): 4 mg via ORAL
  Filled 2024-07-24 (×4): qty 1

## 2024-07-24 MED ORDER — IPRATROPIUM-ALBUTEROL 0.5-2.5 (3) MG/3ML IN SOLN
3.0000 mL | Freq: Four times a day (QID) | RESPIRATORY_TRACT | Status: DC | PRN
  Administered 2024-07-24 – 2024-07-26 (×5): 3 mL via RESPIRATORY_TRACT
  Filled 2024-07-24 (×5): qty 3

## 2024-07-24 MED ORDER — THIAMINE HCL 100 MG/ML IJ SOLN
100.0000 mg | Freq: Every day | INTRAMUSCULAR | Status: DC
  Administered 2024-07-25: 100 mg via INTRAVENOUS

## 2024-07-24 MED ORDER — ACETAMINOPHEN 650 MG RE SUPP: 650.0000 mg | Freq: Four times a day (QID) | RECTAL | Status: DC | PRN

## 2024-07-24 MED ORDER — SACCHAROMYCES BOULARDII 250 MG PO CAPS
250.0000 mg | ORAL_CAPSULE | Freq: Two times a day (BID) | ORAL | Status: DC
  Administered 2024-07-24 – 2024-07-27 (×7): 250 mg via ORAL
  Filled 2024-07-24 (×7): qty 1

## 2024-07-24 MED ORDER — CYANOCOBALAMIN 1000 MCG/ML IJ SOLN
1000.0000 ug | Freq: Every day | INTRAMUSCULAR | Status: AC
  Administered 2024-07-25 – 2024-07-31 (×7): 1000 ug via INTRAMUSCULAR
  Filled 2024-07-24 (×7): qty 1

## 2024-07-24 MED ORDER — HYDRALAZINE HCL 20 MG/ML IJ SOLN
10.0000 mg | Freq: Four times a day (QID) | INTRAMUSCULAR | Status: DC | PRN
  Administered 2024-07-27: 10 mg via INTRAVENOUS
  Filled 2024-07-24 (×2): qty 1

## 2024-07-24 MED ORDER — RAMIPRIL 5 MG PO CAPS
10.0000 mg | ORAL_CAPSULE | Freq: Every day | ORAL | Status: DC
Start: 2024-07-24 — End: 2024-07-30
  Administered 2024-07-24 – 2024-07-29 (×6): 10 mg via ORAL
  Filled 2024-07-24 (×7): qty 2

## 2024-07-24 MED ORDER — FOLIC ACID 1 MG PO TABS
1.0000 mg | ORAL_TABLET | Freq: Every day | ORAL | Status: DC
Start: 2024-07-24 — End: 2024-08-01
  Administered 2024-07-24 – 2024-08-01 (×9): 1 mg via ORAL
  Filled 2024-07-24 (×9): qty 1

## 2024-07-24 MED ORDER — LORAZEPAM 1 MG PO TABS
1.0000 mg | ORAL_TABLET | ORAL | Status: AC | PRN
  Administered 2024-07-25: 1 mg via ORAL
  Administered 2024-07-25: 2 mg via ORAL
  Administered 2024-07-26: 1 mg via ORAL
  Filled 2024-07-24 (×3): qty 1
  Filled 2024-07-24: qty 2

## 2024-07-24 MED ORDER — FENTANYL CITRATE (PF) 50 MCG/ML IJ SOSY
50.0000 ug | PREFILLED_SYRINGE | Freq: Once | INTRAMUSCULAR | Status: AC
  Administered 2024-07-24: 50 ug via INTRAVENOUS
  Filled 2024-07-24: qty 1

## 2024-07-24 MED ORDER — SODIUM CHLORIDE 1 G PO TABS
2.0000 g | ORAL_TABLET | Freq: Two times a day (BID) | ORAL | Status: AC
  Administered 2024-07-24 – 2024-07-29 (×10): 2 g via ORAL
  Filled 2024-07-24 (×10): qty 2

## 2024-07-24 MED ORDER — METHYLPREDNISOLONE SODIUM SUCC 125 MG IJ SOLR
125.0000 mg | Freq: Once | INTRAMUSCULAR | Status: AC
  Administered 2024-07-24: 125 mg via INTRAVENOUS
  Filled 2024-07-24: qty 2

## 2024-07-24 MED ORDER — VITAMIN B-12 1000 MCG PO TABS
1000.0000 ug | ORAL_TABLET | Freq: Every day | ORAL | Status: DC
  Administered 2024-08-01: 1000 ug via ORAL
  Filled 2024-07-24: qty 1

## 2024-07-24 MED ORDER — GUAIFENESIN ER 600 MG PO TB12
600.0000 mg | ORAL_TABLET | Freq: Two times a day (BID) | ORAL | Status: DC
  Administered 2024-07-24 – 2024-08-01 (×15): 600 mg via ORAL
  Filled 2024-07-24 (×16): qty 1

## 2024-07-24 MED ORDER — MORPHINE SULFATE (PF) 2 MG/ML IV SOLN: 2.0000 mg | INTRAVENOUS | Status: DC | PRN

## 2024-07-24 MED ORDER — THIAMINE MONONITRATE 100 MG PO TABS
100.0000 mg | ORAL_TABLET | Freq: Every day | ORAL | Status: DC
  Administered 2024-07-24 – 2024-08-01 (×8): 100 mg via ORAL
  Filled 2024-07-24 (×9): qty 1

## 2024-07-24 MED ORDER — SODIUM CHLORIDE 0.9% FLUSH
3.0000 mL | Freq: Two times a day (BID) | INTRAVENOUS | Status: DC
  Administered 2024-07-24 – 2024-08-01 (×15): 3 mL via INTRAVENOUS

## 2024-07-24 MED ORDER — IPRATROPIUM-ALBUTEROL 0.5-2.5 (3) MG/3ML IN SOLN: 3.0000 mL | Freq: Once | RESPIRATORY_TRACT | Status: DC

## 2024-07-24 MED ORDER — HYDROMORPHONE HCL 1 MG/ML IJ SOLN
1.0000 mg | INTRAMUSCULAR | Status: DC | PRN
  Administered 2024-07-25: 1 mg via INTRAVENOUS
  Filled 2024-07-24 (×2): qty 1

## 2024-07-24 MED ORDER — SODIUM CHLORIDE 0.9 % IV BOLUS (SEPSIS)
1000.0000 mL | Freq: Once | INTRAVENOUS | Status: AC
  Administered 2024-07-24: 1000 mL via INTRAVENOUS

## 2024-07-24 NOTE — ED Triage Notes (Signed)
 Pt arrived via [facility] from home with c/o diarrhea x3 days. Pt reports she started to become nauseous and weak this morning. Pt reports she drinks 6 glasses of wine daily and did consume 6 glasses last night. Pt reports she fell on 07/23/24 and hit her left side at rib cage. Pt c/o pain to the left rib area. No obvious bruising or redness noted to rib cage.

## 2024-07-24 NOTE — H&P (Signed)
 [facility] History and Physical   Patient: Christine Sparks FMW:[id number]   PCP: Herold Hadassah SQUIBB, MD DOB: 1961-08-20   DOA: 07/24/2024   DOS: 07/24/2024   DOS: the patient was seen and examined on 07/24/2024  Patient coming from: The patient is coming from Home  Chief Complaint: Nausea, diarrhea and weakness for past 3 days and fell due to feeling weak.  HPI: Christine Sparks is a 63 y.o. female with Past medical history of COPD, HTN, HLD, as reviewed from EMR, presented to [facility] ED with complaining of feeling weak and fell at home and did not left-sided ribs.  Complaining of pain on the left side and also mentioned to diarrhea for past 3 days with nausea, no vomiting.  Patient was not sure of hitting her head, denies any neck pain or back pain.  Patient reported some wheezing and feeling anxious.  No cough or congestion.  EMS did not notice any hypoxia, but placed on oxygen for comfort and anxiety. Patient drinks 5 to 6 glasses of wine every day, denies any drug abuse but she does smoke cigarettes.  Patient is trying to quit but is still she smokes off-and-on.   ED Course: VS temp 97.6, HR 81, RR 18, BP 153/112, 98% on RA CMP: Sodium 122 hyponatremia, chloride 86, glucose 102, calcium  8.6, AST 49, rest within normal range.  Troponin negative x 2 CBC: Thrombocytosis plt 447, neutrophils 8.3 Negative COVID, RSV and flu UDS positive for benzo CT head: 1. No acute intracranial abnormality. 2. Moderate white matter, probable chronic ischemic microvascular disease. CT C-spine: 1. Mild motion artifact. No acute traumatic injury identified in the cervical spine. 2. Advanced cervical spine degeneration with asymmetric  hyperostosis. Multilevel mild cervical spinal stenosis and moderate to severe left cervical neural foraminal stenosis suspected by CT. CT C/A/P: 1. Subtle nondisplaced fractures of the anterior left 5th and 6th ribs, new from prior chest CT. 2. Motion artifact in the pelvis.  No other  acute traumatic injury identified. 3. Distended gallbladder but no CT evidence of acute cholecystitis. 4. Emphysema.  Mild airway thickening and retained secretions. 5. Coronary artery and aortic atherosclerosis.  EKG: Sinus rhythm, nonischemic   Review of Systems: as mentioned in the history of present illness.  All other systems reviewed and are negative.  Past Medical History:  Diagnosis Date   Acute respiratory failure with hypoxia (HCC) 09/19/2023   Anemia    Anxiety    Arthritis    Bronchitis, chronic obstructive (HCC)    COPD exacerbation (HCC) 06/20/2023   Depression    Diabetes mellitus 10/2010   a1c 5.7, fasting glu 155   Dyspnea    with exertion   H/O bronchitis    Heart murmur    High cholesterol    Hypertension    Menopausal syndrome    NSTEMI (non-ST elevated myocardial infarction) (HCC) 09/19/2023   Tobacco abuse    Past Surgical History:  Procedure Laterality Date   BREAST BIOPSY Left 07/26/2016   complex sclerosing lesion. Coil shape marker   BREAST LUMPECTOMY Left 08/17/2016   complex sclerosing lesion with focal ADH. Clear margins. The coil shaped biopsy marker remains    BREAST LUMPECTOMY WITH NEEDLE LOCALIZATION Left 08/17/2016   Procedure: BREAST LUMPECTOMY WITH NEEDLE LOCALIZATION;  Surgeon: Louanne KANDICE Muse, MD;  Location: [facility] ORS;  Service: General;  Laterality: Left;   CERVICAL CONE BIOPSY     DILATION AND CURETTAGE OF UTERUS     EXCISION / BIOPSY BREAST /  NIPPLE / DUCT Left 08/17/2016   complex sclerosing lesion removed   LIPOMA EXCISION Right    benign tumor right leg   LYMPH NODE BIOPSY     TONSILLECTOMY     Social History:  reports that she has been smoking cigarettes. She started smoking about 45 years ago. She has a 22.6 pack-year smoking history. She has been exposed to tobacco smoke. She has never used smokeless tobacco. She reports current alcohol use of about 6.0 standard drinks of alcohol per week. She reports that she does  not use drugs.  Allergies  Allergen Reactions   Zithromax  [Azithromycin ] Nausea And Vomiting   Quetiapine  Other (See Comments)    nightmares   Buspar  [Buspirone ] Other (See Comments)    Made her feel crazy     Family history reviewed and not pertinent Family History  Problem Relation Age of Onset   Cancer Mother        melanoma   Cancer Father        Lung Cancer   Early death Brother    Heart disease Brother    Breast cancer Paternal Aunt 72   Colon cancer Other    Ovarian cancer Neg Hx    Diabetes Neg Hx      Prior to Admission medications   Medication Sig Start Date End Date Taking? Authorizing Provider  albuterol  (VENTOLIN  HFA) 108 (90 Base) MCG/ACT inhaler Inhale 2 puffs into the lungs every 6 (six) hours as needed for shortness of breath. 06/14/24 06/14/25 Yes Herold Hadassah SQUIBB, MD  famotidine  (PEPCID ) 20 MG tablet Take 1 tablet (20 mg total) by mouth 2 (two) times daily. 06/20/24  Yes Herold Hadassah SQUIBB, MD  ipratropium-albuterol  (DUONEB) 0.5-2.5 (3) MG/3ML SOLN Take 3 mLs by nebulization every 6 (six) hours as needed. 02/07/24  Yes Herold Hadassah SQUIBB, MD  LORazepam  (ATIVAN ) 0.5 MG tablet Take 1 tablet (0.5 mg total) by mouth 2 (two) times daily as needed. for anxiety 07/03/24  Yes Herold Hadassah SQUIBB, MD  ondansetron  (ZOFRAN -ODT) 4 MG disintegrating tablet Take 1 tablet (4 mg total) by mouth every 8 (eight) hours as needed for nausea or vomiting. 06/20/24  Yes Herold Hadassah SQUIBB, MD  ramipril  (ALTACE ) 10 MG capsule Take 1 capsule (10 mg total) by mouth daily. 12/15/23  Yes Herold Hadassah SQUIBB, MD  TRELEGY ELLIPTA  100-62.5-25 MCG/ACT AEPB Inhale 1 puff into the lungs daily. 01/19/24  Yes Melvin Pao, NP  citalopram  (CELEXA ) 40 MG tablet Take 1 tablet (40 mg total) by mouth daily. Patient not taking: Reported on 07/24/2024 05/13/24   Herold Hadassah SQUIBB, MD  nicotine  (NICOTINE  STEP 3) 7 mg/24hr patch APPLY 1 PATCH TO CLEAN/DRY HAIRLESS SKINEVERY DAY AT THE SAME TIME. REMOVE OLD PATCHES AND ROTATE  SITES OF APPLICATION. 07/03/24   Herold Hadassah SQUIBB, MD  rosuvastatin  (CRESTOR ) 5 MG tablet Take 1 tablet (5 mg total) by mouth daily. Patient not taking: Reported on 07/24/2024 12/06/23 12/05/24  Herold Hadassah SQUIBB, MD    Physical Exam: Vitals:   07/24/24 0600 07/24/24 0630 07/24/24 0700 07/24/24 0730  BP: 134/78 128/63 (!) 144/77 (!) 169/78  Pulse: 82 85 87 88  Resp: 13 13 17 18   Temp:    97.8 F (36.6 C)  TempSrc:    Oral  SpO2: 98% 98% 98% 96%  Weight:      Height:        General: alert and oriented to time, place, and person. Appear in mild distress, affect appropriate Eyes: PERRLa, Conjunctiva normal  ENT: Oral Mucosa Clear, moist  Neck: no JVD, no Abnormal Mass Or lumps Cardiovascular: S1 and S2 Present, no Murmur, peripheral pulses symmetrical Respiratory: good respiratory effort, Bilateral Air entry equal and Decreased, no signs of accessory muscle use, Clear to Auscultation, no Crackles, mild wheezes Abdomen: Bowel Sound present, Soft and no tenderness, no hernia Skin: no rashes  Extremities: no Pedal edema, no calf tenderness Neurologic: without any new focal findings Gait not checked due to patient safety concerns  Data Reviewed: I have personally reviewed and interpreted labs, imaging as discussed below.  CBC: Recent Labs  Lab 07/24/24 0417  WBC 10.4  NEUTROABS 8.3*  HGB 13.8  HCT 38.3  MCV 88.7  PLT 447*   Basic Metabolic Panel: Recent Labs  Lab 07/24/24 0417  NA 122*  K 4.1  CL 86*  CO2 26  GLUCOSE 102*  BUN <5*  CREATININE 0.59  CALCIUM  8.6*  MG 1.8   GFR: Estimated Creatinine Clearance: 60.9 mL/min (by C-G formula based on SCr of 0.59 mg/dL). Liver Function Tests: Recent Labs  Lab 07/24/24 0417  AST 49*  ALT 37  ALKPHOS 88  BILITOT 0.7  PROT 6.8  ALBUMIN 3.8   Recent Labs  Lab 07/24/24 0417  LIPASE 27   No results for input(s): AMMONIA in the last 168 hours. Coagulation Profile: No results for input(s): INR, PROTIME in the last  168 hours. Cardiac Enzymes: No results for input(s): CKTOTAL, CKMB, CKMBINDEX, TROPONINI in the last 168 hours. BNP (last 3 results) No results for input(s): PROBNP in the last 8760 hours. HbA1C: No results for input(s): HGBA1C in the last 72 hours. CBG: No results for input(s): GLUCAP in the last 168 hours. Lipid Profile: No results for input(s): CHOL, HDL, LDLCALC, TRIG, CHOLHDL, LDLDIRECT in the last 72 hours. Thyroid  Function Tests: No results for input(s): TSH, T4TOTAL, FREET4, T3FREE, THYROIDAB in the last 72 hours. Anemia Panel: No results for input(s): VITAMINB12, FOLATE, FERRITIN, TIBC, IRON, RETICCTPCT in the last 72 hours. Urine analysis:    Component Value Date/Time   COLORURINE YELLOW (A) 03/26/2024 0950   APPEARANCEUR CLEAR (A) 03/26/2024 0950   LABSPEC 1.012 03/26/2024 0950   PHURINE 6.0 03/26/2024 0950   GLUCOSEU NEGATIVE 03/26/2024 0950   HGBUR SMALL (A) 03/26/2024 0950   BILIRUBINUR NEGATIVE 03/26/2024 0950   KETONESUR NEGATIVE 03/26/2024 0950   PROTEINUR NEGATIVE 03/26/2024 0950   NITRITE NEGATIVE 03/26/2024 0950   LEUKOCYTESUR NEGATIVE 03/26/2024 0950    Radiological Exams on Admission: CT CHEST ABDOMEN PELVIS W CONTRAST Result Date: 07/24/2024 EXAM: CT CHEST, ABDOMEN AND PELVIS WITH CONTRAST 07/24/2024 05:48:42 AM TECHNIQUE: CT of the chest, abdomen and pelvis was performed with the administration of intravenous contrast. Multiplanar reformatted images are provided for review. Automated exposure control, iterative reconstruction, and/or weight based adjustment of the mA/kV was utilized to reduce the radiation dose to as low as reasonably achievable. COMPARISON: Chest CT 10/16/2023. CLINICAL HISTORY: 63 year old female. Fall, left rib pain, abd pain, diarrhea, nausea. FINDINGS: CHEST: MEDIASTINUM AND LYMPH NODES: Normal heart size with evidence of calcified coronary artery and aortic atherosclerosis. The central  airways are clear. No mediastinal, hilar or axillary lymphadenopathy. LUNGS AND PLEURA: Chronic emphysema. Large lung volumes. Trace retained secretions in the trachea. Central airways are patent. Occasional peripheral airway opacification due to retained secretion such as in the right lower lobe on series 4 image 111. No consolidation or pleural effusion. Mild generalized bronchial wall thickening. No suspicious pulmonary nodule. ABDOMEN AND PELVIS: LIVER: Subcentimeter  but circumscribed hypodensity in the left hepatic lobe on series 2 image 56 appears to be a small benign cyst or hemangioma (no follow up imaging recommended). Liver enhancement remains normal. GALLBLADDER AND BILE DUCTS: Gallbladder is distended (coronal image 28). No pericholecystic inflammation, sludge, or stones evident by CT. No biliary ductal dilatation. SPLEEN: Diminutive spleen. PANCREAS: No acute abnormality. ADRENAL GLANDS: No acute abnormality. KIDNEYS, URETERS AND BLADDER: No stones in the kidneys or ureters. No hydronephrosis. No perinephric or periureteral stranding. Grossly negative urinary bladder. GI AND BOWEL: Small gastric hiatal hernia. Motion artifact obscures detail of the distal small bowel in the pelvis and the rectosigmoid colon. Upstream large and small bowel loops are nondilated. Mildly redundant transverse colon. Cecum is on a lax mesentery. Normal gas containing appendix on coronal image 47. REPRODUCTIVE ORGANS: Pelvis motion artifact. Obscured detail of the uterus and adnexa. PERITONEUM AND RETROPERITONEUM: No ascites. No free air. VASCULATURE: Advanced abdominal aortic calcified atherosclerosis continuing into the iliac arteries. Major arterial structures appear patent. Major portal venous structures appear patent. ABDOMINAL AND PELVIS LYMPH NODES: No lymphadenopathy. BONES AND SOFT TISSUES: Non displaced fractures of the anterior left left 5th and 6th ribs are subtle, new from the January CT. No displaced rib  fracture. No superficial soft tissue injury. Maintained vertebral height. Lumbar disc and endplate degeneration. Pelvis motion artifact. IMPRESSION: 1. Subtle nondisplaced fractures of the anterior left 5th and 6th ribs, new from prior chest CT. 2. Motion artifact in the pelvis.  No other acute traumatic injury identified. 3. Distended gallbladder but no CT evidence of acute cholecystitis. 4. Emphysema.  Mild airway thickening and retained secretions. 5. Coronary artery and aortic atherosclerosis. Electronically signed by: Helayne Hurst MD 07/24/2024 06:16 AM EDT RP Workstation: [id number]   CT Cervical Spine Wo Contrast Result Date: 07/24/2024 EXAM: CT CERVICAL SPINE WITHOUT CONTRAST 07/24/2024 05:48:42 AM TECHNIQUE: CT of the cervical spine was performed without the administration of intravenous contrast. Multiplanar reformatted images are provided for review. Automated exposure control, iterative reconstruction, and/or weight based adjustment of the mA/kV was utilized to reduce the radiation dose to as low as reasonably achievable. COMPARISON: Head and chest CT dated 07/24/2024, reported separately. CLINICAL HISTORY: Neck trauma (Age >= 65y). Patient fell on 07/23/2024, hitting left rib cage. Complains of left rib pain, diarrhea, nausea, and weakness. FINDINGS: CERVICAL SPINE: LIMITATIONS/ARTIFACTS: Mild motion artifact. BONES AND ALIGNMENT: Mild straightening of cervical lordosis. Hyperostosis with bulky leftward anterior cervical vertebral endplates. No interbody ankylosis identified. No acute fracture or traumatic malalignment. DEGENERATIVE CHANGES: Chronic C1-C2 degeneration with lateral joint space loss and endplate spurring. Severe diffuse cervical disc degeneration and disc space loss C3-C4 through C6-C7. Mild multilevel cervical spinal stenosis suspected. Widespread moderate and severe left sided neural foraminal stenosis. SOFT TISSUES: No prevertebral soft tissue swelling. LUNGS: Apical lung emphysema.  IMPRESSION: 1. Mild motion artifact. No acute traumatic injury identified in the cervical spine. 2. 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 Multilevel mild cervical spinal stenosis and moderate to severe left cervical neural foraminal stenosis suspected by CT. Electronically signed by: Helayne Hurst MD 07/24/2024 06:06 AM EDT RP Workstation: [id number]   CT HEAD WO CONTRAST ( ) Result Date: 07/24/2024 EXAM: CT HEAD WITHOUT CONTRAST 07/24/2024 05:48:42 AM TECHNIQUE: CT of the head was performed without the administration of intravenous contrast. Automated exposure control, iterative reconstruction, and/or weight based adjustment of the mA/kV was utilized to reduce the radiation dose to as low as reasonably achievable. COMPARISON: None available. CLINICAL HISTORY: Head trauma, minor (Age >=  65y). Pt arrived via [facility] from home with c/o diarrhea x3 days. Pt reports she started to become nauseous and weak this morning. Pt reports she drinks 6 glasses of wine daily and did consume 6 glasses last night. Pt reports she fell on 07/23/24 and hit her left side at rib cage. Pt c/o pain to the left rib area. FINDINGS: BRAIN AND VENTRICLES: No acute hemorrhage. No evidence of acute infarct. Proportional prominence of ventricles and sulci, consistent with diffuse cerebral parenchymal volume loss. Periventricular and subcortical white matter hypoattenuation, consistent with moderate chronic ischemic microvascular disease. No hydrocephalus. No extra-axial collection. No mass effect or midline shift. ORBITS: No acute abnormality. SINUSES: No acute abnormality. SOFT TISSUES AND SKULL: No acute soft tissue abnormality. No skull fracture. IMPRESSION: 1. No acute intracranial abnormality. 2. Moderate white matter, probable chronic ischemic microvascular disease. Electronically signed by: Helayne Hurst MD 07/24/2024 06:03 AM EDT RP Workstation: [id number]    I reviewed all nursing notes, pharmacy notes,  vitals, pertinent old records.  Assessment/Plan  Principal Problem:   Hyponatremia   # Hypotonic hyponatremia Presented with generalized weakness and fall Serum osmolality 262, low Na 122 Continue fluid restriction 1.5 to 2/day Salt tablets 2 g twice daily Continue IV fluid for hydration Check sodium level daily Follow PT and OT eval  # COPD exacerbation S/p Solu-Medrol  125 mg given in the ED Resumed Breztri  inhaler Started Mucinex  600 mg p.o. twice daily DuoNeb every 6 hourly   # Left 5th and 6th rib fracture, nondisplaced. S/p fall at home secondary to dehydration due to diarrhea Continue as needed meds for pain control  # Cervical stenosis Continue as needed medication for pain control Recommend to follow with PCP and spine surgery as an outpatient if she experience any problems in the future.   # Diarrhea, unknown cause Last Diarrheal disorder was on 10/21 before admission Send stool sample for C. difficile and GI pathogen if recurrent diarrhea Keep on contact isolation Continue IV fluid for hydration Started probiotics Use PPI for GI prophylaxis  # EtOH use disorder: Patient is counseling done Patient drinks 5 to 6 glasses of wine daily Continue CIWA protocol and monitor for withdrawals Started multivitamin, folate and thiamine    # Nicotine  dependence, patient smokes cigarettes off-and-on to quit. Use nicotine  patch.  Smoking cessation counseling done.  # Hypertension Resume the lisinopril  10 mg p.o. daily home dose Use hydralazine  as needed Monitor BP and titrate medication accordingly  # Depression and anxiety Resumed Ativan  as needed home dose Patient used to take Celexa  but not taking anymore.    Nutrition: Cardiac diet DVT Prophylaxis: Subcutaneous Lovenox   Advance goals of care discussion: Full code   Consults: None  Family Communication: family was not present at bedside, at the time of interview.  Opportunity was given to ask question  and all questions were answered satisfactorily.  Disposition: Admitted as inpatient, medical telemetry unit. Likely to be discharged home, in few days.  I have discussed plan of care as described above with RN and patient/family.  Severity of Illness: The appropriate patient status for this patient is INPATIENT. Inpatient status is judged to be reasonable and necessary in order to provide the required intensity of service to ensure the patient's safety. The patient's presenting symptoms, physical exam findings, and initial radiographic and laboratory data in the context of their chronic comorbidities is felt to place them at high risk for further clinical deterioration. Furthermore, it is not anticipated that the patient will be medically stable  for discharge from the hospital within 2 midnights of admission.   * I certify that at the point of admission it is my clinical judgment that the patient will require inpatient hospital care spanning beyond 2 midnights from the point of admission due to high intensity of service, high risk for further deterioration and high frequency of surveillance required.*   Author: ELVAN SOR, MD [facility] 07/24/2024 8:41 AM   To reach On-call, see care teams to locate the attending and reach out to them via [email or web address]. If 7PM-7AM, please contact night-coverage If you still have difficulty reaching the attending provider, please page the [facility] (Director on Call) for [facility] on amion for assistance.

## 2024-07-24 NOTE — ED Provider Notes (Signed)
 [facility] Provider Note    Event Date/Time   First MD Initiated Contact with Patient 07/24/24 [phone]     (approximate)   History   Diarrhea   HPI  Christine Sparks is a 63 y.o. female with history of COPD not on chronic oxygen, hypertension, diabetes, hyperlipidemia who presents to the emergency department with complaints of 3 days of nausea without vomiting, diarrhea, abdominal discomfort.  Reports feeling very weak and having a fall yesterday and hitting her left sided ribs.  Is not sure if she hit her head.  Denies neck or back pain.  She is not on any blood thinners.  She is wheezing here and reports feeling anxious.  No cough, congestion, chest pain.  No hypoxia with EMS but asked EMS to place her on oxygen for anxiety.  She denies prior abdominal surgery.  Denies dysuria, hematuria, vaginal bleeding or discharge.  No sick contacts, recent travel, antibiotic use or hospitalization.   History provided by patient, EMS.    Past Medical History:  Diagnosis Date   Acute respiratory failure with hypoxia (HCC) 09/19/2023   Anemia    Anxiety    Arthritis    Bronchitis, chronic obstructive (HCC)    COPD exacerbation (HCC) 06/20/2023   Depression    Diabetes mellitus 10/2010   a1c 5.7, fasting glu 155   Dyspnea    with exertion   H/O bronchitis    Heart murmur    High cholesterol    Hypertension    Menopausal syndrome    NSTEMI (non-ST elevated myocardial infarction) (HCC) 09/19/2023   Tobacco abuse     Past Surgical History:  Procedure Laterality Date   BREAST BIOPSY Left 07/26/2016   complex sclerosing lesion. Coil shape marker   BREAST LUMPECTOMY Left 08/17/2016   complex sclerosing lesion with focal ADH. Clear margins. The coil shaped biopsy marker remains    BREAST LUMPECTOMY WITH NEEDLE LOCALIZATION Left 08/17/2016   Procedure: BREAST LUMPECTOMY WITH NEEDLE LOCALIZATION;  Surgeon: Louanne KANDICE Muse, MD;  Location: [facility] ORS;  Service:  General;  Laterality: Left;   CERVICAL CONE BIOPSY     DILATION AND CURETTAGE OF UTERUS     EXCISION / BIOPSY BREAST / NIPPLE / DUCT Left 08/17/2016   complex sclerosing lesion removed   LIPOMA EXCISION Right    benign tumor right leg   LYMPH NODE BIOPSY     TONSILLECTOMY      MEDICATIONS:  Prior to Admission medications   Medication Sig Start Date End Date Taking? Authorizing Provider  albuterol  (VENTOLIN  HFA) 108 (90 Base) MCG/ACT inhaler Inhale 2 puffs into the lungs every 6 (six) hours as needed for shortness of breath. 06/14/24 06/14/25  Herold Hadassah SQUIBB, MD  citalopram  (CELEXA ) 40 MG tablet Take 1 tablet (40 mg total) by mouth daily. 05/13/24   Herold Hadassah SQUIBB, MD  famotidine  (PEPCID ) 20 MG tablet Take 1 tablet (20 mg total) by mouth 2 (two) times daily. 06/20/24   Herold Hadassah SQUIBB, MD  ipratropium-albuterol  (DUONEB) 0.5-2.5 (3) MG/3ML SOLN Take 3 mLs by nebulization every 6 (six) hours as needed. 02/07/24   Herold Hadassah SQUIBB, MD  LORazepam  (ATIVAN ) 0.5 MG tablet Take 1 tablet (0.5 mg total) by mouth 2 (two) times daily as needed. for anxiety 07/03/24   Herold Hadassah SQUIBB, MD  nicotine  (NICOTINE  STEP 3) 7 mg/24hr patch APPLY 1 PATCH TO CLEAN/DRY HAIRLESS SKINEVERY DAY AT THE SAME TIME. REMOVE OLD PATCHES AND ROTATE SITES OF APPLICATION. 07/03/24  Herold Hadassah SQUIBB, MD  ondansetron  (ZOFRAN -ODT) 4 MG disintegrating tablet Take 1 tablet (4 mg total) by mouth every 8 (eight) hours as needed for nausea or vomiting. 06/20/24   Herold Hadassah SQUIBB, MD  ramipril  (ALTACE ) 10 MG capsule Take 1 capsule (10 mg total) by mouth daily. 12/15/23   Herold Hadassah SQUIBB, MD  rosuvastatin  (CRESTOR ) 5 MG tablet Take 1 tablet (5 mg total) by mouth daily. 12/06/23 12/05/24  Herold Hadassah SQUIBB, MD  TRELEGY ELLIPTA  100-62.5-25 MCG/ACT AEPB Inhale 1 puff into the lungs daily. 01/19/24   Melvin Pao, NP    Physical Exam   ED Triage Vitals  Encounter Vitals Group     BP 07/24/24 0421 (!) 153/112     Girls Systolic BP Percentile --       Girls Diastolic BP Percentile --      Boys Systolic BP Percentile --      Boys Diastolic BP Percentile --      Pulse Rate 07/24/24 0421 81     Resp 07/24/24 0421 18     Temp 07/24/24 0421 97.6 F (36.4 C)     Temp Source 07/24/24 0421 Oral     SpO2 07/24/24 0411 98 %     Weight 07/24/24 0416 121 lb 4.1 oz (55 kg)     Height 07/24/24 0416 5' 5 (1.651 m)     Head Circumference --      Peak Flow --      Pain Score 07/24/24 0415 3     Pain Loc --      Pain Education --      Exclude from Growth Chart --       Most recent vital signs: Vitals:   07/24/24 0411 07/24/24 0421  BP:  (!) 153/112  Pulse:  81  Resp:  18  Temp:  97.6 F (36.4 C)  SpO2: 98% 98%     CONSTITUTIONAL: Alert, responds appropriately to questions.  Chronically ill-appearing, appears uncomfortable HEAD: Normocephalic; atraumatic EYES: Conjunctivae clear, PERRL, EOMI ENT: normal nose; no rhinorrhea; moist mucous membranes; pharynx without lesions noted; no dental injury; no septal hematoma, no epistaxis; no facial deformity or bony tenderness NECK: Supple, no midline spinal tenderness, step-off or deformity; trachea midline CARD: RRR; S1 and S2 appreciated; no murmurs, no clicks, no rubs, no gallops RESP: Patient is tachypneic, speaking truncated sentences, diffuse inspiratory and expiratory wheezing, diminished aeration at bases bilaterally, no rhonchi CHEST:  chest wall stable, no crepitus or ecchymosis or deformity, tender over the left chest wall ABD/GI: Non-distended; soft, tender throughout the abdomen without guarding or rebound, no ecchymosis PELVIS:  stable, nontender to palpation BACK:  The back appears normal; no midline spinal tenderness, step-off or deformity EXT: Normal ROM in all joints; no edema; normal capillary refill; no cyanosis, no bony tenderness or bony deformity of patient's extremities, no joint effusions, compartments are soft, extremities are warm and well-perfused, no  ecchymosis SKIN: Normal color for age and race; warm NEURO: No facial asymmetry, normal speech, moving all extremities equally  ED Results / Procedures / Treatments   LABS: (all labs ordered are listed, but only abnormal results are displayed) Labs Reviewed  CBC WITH DIFFERENTIAL/PLATELET - Abnormal; Notable for the following components:      Result Value   Platelets 447 (*)    Neutro Abs 8.3 (*)    All other components within normal limits  COMPREHENSIVE METABOLIC PANEL WITH GFR - Abnormal; Notable for the following components:   Sodium 122 (*)  Chloride 86 (*)    Glucose, Bld 102 (*)    BUN <5 (*)    Calcium  8.6 (*)    AST 49 (*)    All other components within normal limits  RESP PANEL BY RT-PCR (RSV, FLU A&B, COVID)  RVPGX2  GASTROINTESTINAL PANEL BY PCR, STOOL (REPLACES STOOL CULTURE)  LIPASE, BLOOD  MAGNESIUM   URINALYSIS, W/ REFLEX TO CULTURE (INFECTION SUSPECTED)  TROPONIN I (HIGH SENSITIVITY)  TROPONIN I (HIGH SENSITIVITY)     EKG:  EKG Interpretation Date/Time:  Wednesday July 24 2024 04:20:01 EDT Ventricular Rate:  84 PR Interval:  107 QRS Duration:  90 QT Interval:  380 QTC Calculation: 450 R Axis:   77  Text Interpretation: Sinus rhythm Atrial premature complex Short PR interval RAE, consider biatrial enlargement Minimal ST elevation, lateral leads Baseline wander in lead(s) III Confirmed by Neomi Neptune [phone]) on 07/24/2024 4:21:42 AM          RADIOLOGY: My personal review and interpretation of imaging: Patient has 2 left-sided rib fractures.  Normal appendix.  No colitis.  No bowel obstruction.  CT head and cervical spine show no injury.  I have personally reviewed all radiology reports. CT CHEST ABDOMEN PELVIS W CONTRAST Result Date: 07/24/2024 EXAM: CT CHEST, ABDOMEN AND PELVIS WITH CONTRAST 07/24/2024 05:48:42 AM TECHNIQUE: CT of the chest, abdomen and pelvis was performed with the administration of intravenous contrast. Multiplanar  reformatted images are provided for review. Automated exposure control, iterative reconstruction, and/or weight based adjustment of the mA/kV was utilized to reduce the radiation dose to as low as reasonably achievable. COMPARISON: Chest CT 10/16/2023. CLINICAL HISTORY: 63 year old female. Fall, left rib pain, abd pain, diarrhea, nausea. FINDINGS: CHEST: MEDIASTINUM AND LYMPH NODES: Normal heart size with evidence of calcified coronary artery and aortic atherosclerosis. The central airways are clear. No mediastinal, hilar or axillary lymphadenopathy. LUNGS AND PLEURA: Chronic emphysema. Large lung volumes. Trace retained secretions in the trachea. Central airways are patent. Occasional peripheral airway opacification due to retained secretion such as in the right lower lobe on series 4 image 111. No consolidation or pleural effusion. Mild generalized bronchial wall thickening. No suspicious pulmonary nodule. ABDOMEN AND PELVIS: LIVER: Subcentimeter but circumscribed hypodensity in the left hepatic lobe on series 2 image 56 appears to be a small benign cyst or hemangioma (no follow up imaging recommended). Liver enhancement remains normal. GALLBLADDER AND BILE DUCTS: Gallbladder is distended (coronal image 28). No pericholecystic inflammation, sludge, or stones evident by CT. No biliary ductal dilatation. SPLEEN: Diminutive spleen. PANCREAS: No acute abnormality. ADRENAL GLANDS: No acute abnormality. KIDNEYS, URETERS AND BLADDER: No stones in the kidneys or ureters. No hydronephrosis. No perinephric or periureteral stranding. Grossly negative urinary bladder. GI AND BOWEL: Small gastric hiatal hernia. Motion artifact obscures detail of the distal small bowel in the pelvis and the rectosigmoid colon. Upstream large and small bowel loops are nondilated. Mildly redundant transverse colon. Cecum is on a lax mesentery. Normal gas containing appendix on coronal image 47. REPRODUCTIVE ORGANS: Pelvis motion artifact.  Obscured detail of the uterus and adnexa. PERITONEUM AND RETROPERITONEUM: No ascites. No free air. VASCULATURE: Advanced abdominal aortic calcified atherosclerosis continuing into the iliac arteries. Major arterial structures appear patent. Major portal venous structures appear patent. ABDOMINAL AND PELVIS LYMPH NODES: No lymphadenopathy. BONES AND SOFT TISSUES: Non displaced fractures of the anterior left left 5th and 6th ribs are subtle, new from the January CT. No displaced rib fracture. No superficial soft tissue injury. Maintained vertebral height. Lumbar  disc and endplate degeneration. Pelvis motion artifact. IMPRESSION: 1. Subtle nondisplaced fractures of the anterior left 5th and 6th ribs, new from prior chest CT. 2. Motion artifact in the pelvis.  No other acute traumatic injury identified. 3. Distended gallbladder but no CT evidence of acute cholecystitis. 4. Emphysema.  Mild airway thickening and retained secretions. 5. Coronary artery and aortic atherosclerosis. Electronically signed by: Helayne Hurst MD 07/24/2024 06:16 AM EDT RP Workstation: [id number]   CT Cervical Spine Wo Contrast Result Date: 07/24/2024 EXAM: CT CERVICAL SPINE WITHOUT CONTRAST 07/24/2024 05:48:42 AM TECHNIQUE: CT of the cervical spine was performed without the administration of intravenous contrast. Multiplanar reformatted images are provided for review. Automated exposure control, iterative reconstruction, and/or weight based adjustment of the mA/kV was utilized to reduce the radiation dose to as low as reasonably achievable. COMPARISON: Head and chest CT dated 07/24/2024, reported separately. CLINICAL HISTORY: Neck trauma (Age >= 65y). Patient fell on 07/23/2024, hitting left rib cage. Complains of left rib pain, diarrhea, nausea, and weakness. FINDINGS: CERVICAL SPINE: LIMITATIONS/ARTIFACTS: Mild motion artifact. BONES AND ALIGNMENT: Mild straightening of cervical lordosis. Hyperostosis with bulky leftward anterior cervical  vertebral endplates. No interbody ankylosis identified. No acute fracture or traumatic malalignment. DEGENERATIVE CHANGES: Chronic C1-C2 degeneration with lateral joint space loss and endplate spurring. Severe diffuse cervical disc degeneration and disc space loss C3-C4 through C6-C7. Mild multilevel cervical spinal stenosis suspected. Widespread moderate and severe left sided neural foraminal stenosis. SOFT TISSUES: No prevertebral soft tissue swelling. LUNGS: Apical lung emphysema. IMPRESSION: 1. Mild motion artifact. No acute traumatic injury identified in the cervical spine. 2. 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 Multilevel mild cervical spinal stenosis and moderate to severe left cervical neural foraminal stenosis suspected by CT. Electronically signed by: Helayne Hurst MD 07/24/2024 06:06 AM EDT RP Workstation: [id number]   CT HEAD WO CONTRAST ( ) Result Date: 07/24/2024 EXAM: CT HEAD WITHOUT CONTRAST 07/24/2024 05:48:42 AM TECHNIQUE: CT of the head was performed without the administration of intravenous contrast. Automated exposure control, iterative reconstruction, and/or weight based adjustment of the mA/kV was utilized to reduce the radiation dose to as low as reasonably achievable. COMPARISON: None available. CLINICAL HISTORY: Head trauma, minor (Age >= 65y). Pt arrived via [facility] from home with c/o diarrhea x3 days. Pt reports she started to become nauseous and weak this morning. Pt reports she drinks 6 glasses of wine daily and did consume 6 glasses last night. Pt reports she fell on 07/23/24 and hit her left side at rib cage. Pt c/o pain to the left rib area. FINDINGS: BRAIN AND VENTRICLES: No acute hemorrhage. No evidence of acute infarct. Proportional prominence of ventricles and sulci, consistent with diffuse cerebral parenchymal volume loss. Periventricular and subcortical white matter hypoattenuation, consistent with moderate chronic ischemic microvascular  disease. No hydrocephalus. No extra-axial collection. No mass effect or midline shift. ORBITS: No acute abnormality. SINUSES: No acute abnormality. SOFT TISSUES AND SKULL: No acute soft tissue abnormality. No skull fracture. IMPRESSION: 1. No acute intracranial abnormality. 2. Moderate white matter, probable chronic ischemic microvascular disease. Electronically signed by: Helayne Hurst MD 07/24/2024 06:03 AM EDT RP Workstation: [id number]     PROCEDURES:  Critical Care performed: Yes, see critical care procedure note(s)   CRITICAL CARE Performed by: Josette Sink   Total critical care time: 30 minutes  Critical care time was exclusive of separately billable procedures and treating other patients.  Critical care was necessary to treat or prevent imminent or life-threatening deterioration.  Critical care was time spent  personally by me on the following activities: development of treatment plan with patient and/or surrogate as well as nursing, discussions with consultants, evaluation of patient's response to treatment, examination of patient, obtaining history from patient or surrogate, ordering and performing treatments and interventions, ordering and review of laboratory studies, ordering and review of radiographic studies, pulse oximetry and re-evaluation of patient's condition.   [name]1-3 Lead EKG Interpretation  Performed by: Maris Bena, Josette SAILOR, DO Authorized by: Evaline Waltman, Josette SAILOR, DO       IMPRESSION / MDM / ASSESSMENT AND PLAN / ED COURSE  I reviewed the triage vital signs and the nursing notes.  Patient here with complaints of nausea, diarrhea, abdominal pain.  The patient is on the cardiac monitor to evaluate for evidence of arrhythmia and/or significant heart rate changes.   DIFFERENTIAL DIAGNOSIS (includes but not limited to):   Colitis, diverticulitis, bowel obstruction, infectious diarrhea, viral gastroenteritis, UTI, appendicitis  COPD exacerbation, viral URI, pneumonia, less  likely ACS, PE  Anemia, electrolyte derangement, dehydration  Rib fracture, pneumothorax, pulmonary contusion, rib contusion, intracranial hemorrhage, skull fracture, cervical spine fracture  Patient's presentation is most consistent with acute presentation with potential threat to life or bodily function.  PLAN: Will obtain labs, COVID and flu swab, CTs of the head, cervical spine and chest given history of fall with possible head injury and complaints of rib pain.  Will also obtain CT of the abdomen pelvis given nausea, diarrhea and generalized tenderness on exam.  She is wheezing here.  Will give DuoNebs, Solu-Medrol .  Will give IV fluids, Zofran , fentanyl .   MEDICATIONS GIVEN IN ED: Medications  0.9 %  sodium chloride  infusion ( Intravenous New Bag/Given 07/24/24 0603)  LORazepam  (ATIVAN ) injection 1 mg (has no administration in time range)  ipratropium-albuterol  (DUONEB) 0.5-2.5 (3) MG/3ML nebulizer solution 3 mL (has no administration in time range)  sodium chloride  0.9 % bolus 1,000 mL (0 mLs Intravenous Stopped 07/24/24 0515)  ondansetron  (ZOFRAN ) injection 4 mg (4 mg Intravenous Given 07/24/24 0431)  ipratropium-albuterol  (DUONEB) 0.5-2.5 (3) MG/3ML nebulizer solution 3 mL (3 mLs Nebulization Given 07/24/24 0517)  methylPREDNISolone  sodium succinate (SOLU-MEDROL ) 125 mg/2 mL injection 125 mg (125 mg Intravenous Given 07/24/24 0431)  fentaNYL  (SUBLIMAZE ) injection 50 mcg (50 mcg Intravenous Given 07/24/24 0432)  iohexol  (OMNIPAQUE ) 300 MG/ML solution 80 mL (80 mLs Intravenous Contrast Given 07/24/24 0526)  ondansetron  (ZOFRAN ) injection 4 mg (4 mg Intravenous Given 07/24/24 0559)     ED COURSE: Labs show no leukocytosis, normal hemoglobin.  Sodium of 122 likely from GI loss.  Will continue IV fluids.  This is likely the reason for her weakness.  She will need admission for severe hyponatremia.  Potassium and magnesium  normal.  Troponin negative.   CT scans reviewed and  interpreted by myself and the radiologist and show acute left 5th and 6th rib fractures but no pneumothorax or other acute injury.  She does have distended gallbladder but no cholecystitis.  No specific tenderness in the right upper quadrant.  Appendix is normal.   Patient still wheezing.  Will continue breathing treatments.  She states she is feeling more anxious.  Will give Ativan .  She has not been hypoxic here with EMS but was placed on oxygen by EMS for comfort and anxiety.   Will discuss with hospitalist for admission for pain control from fall, rib fractures, management of COPD exacerbation, treatment of hyponatremia likely from GI loss leading to generalized weakness.  CONSULTS:  Consulted and discussed patient's case with hospitalist, Dr.  Mansy.  I have recommended admission and consulting physician agrees and will place admission orders.  Patient (and family if present) agree with this plan.   I reviewed all nursing notes, vitals, pertinent previous records.  All labs, EKGs, imaging ordered have been independently reviewed and interpreted by myself.    OUTSIDE RECORDS REVIEWED: Reviewed recent family medicine notes.       FINAL CLINICAL IMPRESSION(S) / ED DIAGNOSES   Final diagnoses:  COPD with acute exacerbation (HCC)  Diarrhea of infectious origin  Hyponatremia  Generalized weakness  Fall, initial encounter  Closed fracture of multiple ribs of left side, initial encounter     Rx / DC Orders   ED Discharge Orders     None        Note:  This document was prepared using Dragon voice recognition software and may include unintentional dictation errors.   Shloime Keilman, Josette SAILOR, DO 07/24/24 [phone]

## 2024-07-24 NOTE — Progress Notes (Deleted)
  Cardiology Office Note   Date:  07/24/2024  ID:  [name], [name] [birth year], MRN [id number] PCP: Herold Hadassah SQUIBB, MD  [facility] Providers Cardiologist:  None { Click to update primary MD,subspecialty MD or APP then REFRESH:1}    History of Present Illness Christine Sparks is a 63 y.o. female PMH COPD, HTN, HLD, alcohol dependence who presents for further evaluation and management of lower extremity edema and elevated BNP.  Patient has recently been treated for multiple COPD exacerbations.  During 1 of these she was noted to have an elevated BNP to 230 which then down trended to 80s.  There was also lower extremity edema present.  Last LDL 59 01/2023.  Relevant CVD History -CT chest 10/2023 with significant aortic atherosclerosis and three-vessel CAC -TTE 09/20/2023 LVEF 60 to 65% with indeterminate diastolic function.  Normal RV size and function.  No significant valvular disease.    ROS: Pt denies any chest discomfort, jaw pain, arm pain, palpitations, syncope, presyncope, orthopnea, PND, or LE edema.  Studies Reviewed I have independently reviewed the patient's ECG, recent medical records, recent blood work, and previous cardiac studies.  Physical Exam VS:  There were no vitals taken for this visit.       Wt Readings from Last 3 Encounters:  07/24/24 121 lb 4.1 oz (55 kg)  05/13/24 119 lb 12.8 oz (54.3 kg)  04/03/24 118 lb (53.5 kg)    GEN: No acute distress. NECK: No JVD; No carotid bruits. CARDIAC: ***RRR, no murmurs, rubs, gallops. RESPIRATORY:  Clear to auscultation. EXTREMITIES:  Warm and well-perfused. No edema.  ASSESSMENT AND PLAN Elevated BNP LE edema Aortic atherosclerosis CAC        {Are you ordering a CV Procedure (e.g. stress test, cath, DCCV, TEE, etc)?   Press F2        :[id number]}  Dispo: ***  Signed, Caron Poser, MD

## 2024-07-24 NOTE — Plan of Care (Signed)
   Problem: Education: Goal: Knowledge of General Education information will improve Description Including pain rating scale, medication(s)/side effects and non-pharmacologic comfort measures Outcome: Progressing

## 2024-07-25 ENCOUNTER — Inpatient Hospital Stay

## 2024-07-25 ENCOUNTER — Ambulatory Visit

## 2024-07-25 DIAGNOSIS — E871 Hypo-osmolality and hyponatremia: Secondary | ICD-10-CM | POA: Diagnosis not present

## 2024-07-25 LAB — BASIC METABOLIC PANEL WITH GFR
Anion gap: 7 (ref 5–15)
BUN: 9 mg/dL (ref 8–23)
CO2: 24 mmol/L (ref 22–32)
Calcium: 8.7 mg/dL — ABNORMAL LOW (ref 8.9–10.3)
Chloride: 97 mmol/L — ABNORMAL LOW (ref 98–111)
Creatinine, Ser: 0.57 mg/dL (ref 0.44–1.00)
GFR, Estimated: >60 mL/min (ref >60)
Glucose, Bld: 101 mg/dL — ABNORMAL HIGH (ref 70–99)
Potassium: 4.7 mmol/L (ref 3.5–5.1)
Sodium: 128 mmol/L — ABNORMAL LOW (ref 135–145)

## 2024-07-25 LAB — CBC
HCT: 29.4 % — ABNORMAL LOW (ref 36.0–46.0)
Hemoglobin: 10.5 g/dL — ABNORMAL LOW (ref 12.0–15.0)
MCH: 32.8 pg (ref 26.0–34.0)
MCHC: 35.7 g/dL (ref 30.0–36.0)
MCV: 91.9 fL (ref 80.0–100.0)
Platelets: 335 K/uL (ref 150–400)
RBC: 3.2 MIL/uL — ABNORMAL LOW (ref 3.87–5.11)
RDW: 12 % (ref 11.5–15.5)
WBC: 11.1 K/uL — ABNORMAL HIGH (ref 4.0–10.5)
nRBC: 0 % (ref 0.0–0.2)

## 2024-07-25 LAB — MAGNESIUM: Magnesium: 1.9 mg/dL (ref 1.7–2.4)

## 2024-07-25 LAB — PHOSPHORUS: Phosphorus: 4 mg/dL (ref 2.5–4.6)

## 2024-07-25 MED ORDER — METHYLPREDNISOLONE SODIUM SUCC 40 MG IJ SOLR
40.0000 mg | Freq: Two times a day (BID) | INTRAMUSCULAR | Status: AC
Start: 2024-07-26 — End: 2024-07-27
  Administered 2024-07-26 – 2024-07-27 (×4): 40 mg via INTRAVENOUS
  Filled 2024-07-25 (×4): qty 1

## 2024-07-25 MED ORDER — PREDNISONE 20 MG PO TABS
40.0000 mg | ORAL_TABLET | Freq: Every day | ORAL | Status: AC
  Administered 2024-07-28 – 2024-07-30 (×3): 40 mg via ORAL
  Filled 2024-07-25 (×3): qty 2

## 2024-07-25 MED ORDER — ARFORMOTEROL TARTRATE 15 MCG/2ML IN NEBU
15.0000 ug | INHALATION_SOLUTION | Freq: Two times a day (BID) | RESPIRATORY_TRACT | Status: DC
  Administered 2024-07-25 – 2024-08-01 (×14): 15 ug via RESPIRATORY_TRACT
  Filled 2024-07-25 (×16): qty 2

## 2024-07-25 MED ORDER — METHYLPREDNISOLONE SODIUM SUCC 125 MG IJ SOLR
125.0000 mg | Freq: Once | INTRAMUSCULAR | Status: AC
  Administered 2024-07-25: 125 mg via INTRAVENOUS
  Filled 2024-07-25: qty 2

## 2024-07-25 MED ORDER — BUDESONIDE 0.25 MG/2ML IN SUSP
0.2500 mg | Freq: Two times a day (BID) | RESPIRATORY_TRACT | Status: DC
  Administered 2024-07-25 – 2024-08-01 (×14): 0.25 mg via RESPIRATORY_TRACT
  Filled 2024-07-25 (×14): qty 2

## 2024-07-25 MED ORDER — ACETAMINOPHEN 325 MG PO TABS
650.0000 mg | ORAL_TABLET | Freq: Three times a day (TID) | ORAL | Status: DC
  Administered 2024-07-25 – 2024-08-01 (×20): 650 mg via ORAL
  Filled 2024-07-25 (×21): qty 2

## 2024-07-25 NOTE — Evaluation (Signed)
 Physical Therapy Evaluation Patient Details Name: Christine Sparks MRN: [id number] DOB: 03/11/1961 Today's Date: 07/25/2024  History of Present Illness  Pt is a 63 y/o F presenting to ED with c/o weakness, diarrhea, fall at home. CT head and neck negative, CT chest/abdomen found subtle nondisplaced fractures of anterior L 5th and 6th ribs. MD assessment includes hypotonic hyponatremia, COPD exacerbation, cervical stenosis, diarrhea of unknown cause, ETOH use disorder. PMH significant for COPD, HTN, HLD.   Clinical Impression  Pt A&Ox4, agreeable to participate in PT evaluation. At baseline, pt is modI with [facility] for ambulation, IND with ADLs, denies falls prior to the one associated with this admission. Pt was met semi-supine in bed, reported feeling very short of breath upon entry to room with significant wheezing throughout. Pt able to transfer to sitting EOB with minA trunk assist, very effortful for pt to complete transfers this date due to SOB. Pt declined OOB mobility despite extensive education provided on importance of mobility during admission for maintaining lung capacity/muscle strength and preventing skin breakdown. Pt able to maintain sitting EOB for ~5 minutes during session, very tremulous initially that pt reported could be due to alcohol withdrawal, MD/RN notified. HR and SpO2 monitored throughout session and remained WFL with pt on 2L [place]. Pt was left semi-supine in bed at end of session, all needs in reach, RN present in room. Pt would benefit from skilled PT intervention to address listed deficits (see PT Problem List) and allow for safe return to PLOF.       If plan is discharge home, recommend the following: A little help with walking and/or transfers;A little help with bathing/dressing/bathroom;Assistance with cooking/housework;Assist for transportation;Help with stairs or ramp for entrance   Can travel by private vehicle   Yes    Equipment Recommendations Other (comment) (TBD)   Recommendations for Other Services       Functional Status Assessment Patient has had a recent decline in their functional status and demonstrates the ability to make significant improvements in function in a reasonable and predictable amount of time.     Precautions / Restrictions Precautions Precautions: Fall Recall of Precautions/Restrictions: Intact Restrictions Weight Bearing Restrictions Per Provider Order: No      Mobility  Bed Mobility Overal bed mobility: Needs Assistance Bed Mobility: Supine to Sit, Sit to Supine     Supine to sit: Min assist, HOB elevated, Used rails Sit to supine: Supervision   General bed mobility comments: minA trunk assist to achieve sitting EOB, very effortful for all transfers    Transfers Overall transfer level: Needs assistance Equipment used: None              Lateral/Scoot Transfers: Contact guard assist General transfer comment: Pt able to laterally scoot hips toward HOB with CGA and VC for sequencing/weight shifting. Pt declined additional OOB activity due to fatigue, SOB,  and increased work of breathing    Ambulation/Gait               General Gait Details: NT this session- pt refused OOB mobility due to fatigue and SOB  Stairs            Wheelchair Mobility     Tilt Bed    Modified Rankin (Stroke Patients Only)       Balance Overall balance assessment: Needs assistance Sitting-balance support: Feet supported Sitting balance-Leahy Scale: Fair Sitting balance - Comments: tremulous in sitting but able to maintain balance with UE support- pt reported could be due to alcohol  withdrawal                                     Pertinent Vitals/Pain Pain Assessment Pain Assessment: 0-10 Pain Score: 7  Pain Location: L side flank/rib pain Pain Descriptors / Indicators: Aching, Grimacing, Sharp Pain Intervention(s): Limited activity within patient's tolerance, Monitored during session,  Repositioned, RN gave pain meds during session    Home Living Family/patient expects to be discharged to:: Private residence Living Arrangements: Alone   Type of Home: House Home Access: Stairs to enter Entrance Stairs-Rails: Left Entrance Stairs-Number of Steps: 4   Home Layout: One level Home Equipment: Shower seat;Cane - single Librarian, academic (2 wheels) Additional Comments: pt reports that she does not have anyone that comes to check on her at home    Prior Function Prior Level of Function : Independent/Modified Independent;Driving             Mobility Comments: Pt reports using SPC for ambulation, limited household ambulator recently due to increased SOB with activity. Denies additional falls prior to the one associated with this admission ADLs Comments: IND with ADLs, driving     Extremity/Trunk Assessment   Upper Extremity Assessment Upper Extremity Assessment: Defer to OT evaluation    Lower Extremity Assessment Lower Extremity Assessment: Generalized weakness       Communication   Communication Communication: No apparent difficulties    Cognition Arousal: Alert Behavior During Therapy: Anxious   PT - Cognitive impairments: No apparent impairments                       PT - Cognition Comments: A&Ox4 Following commands: Intact       Cueing Cueing Techniques: Verbal cues, Visual cues     General Comments      Exercises Other Exercises Other Exercises: HR and SpO2 monitored during session with pt on 2L [place] throughout: HR 98-102 at rest in supine SpO2 100%. After transfer to sitting EOB HR 123bpm SpO2 96%, recovered to HR 99bpm SpO2 100% with rest and cues for pursed lip breathing   Assessment/Plan    PT Assessment Patient needs continued PT services  PT Problem List Decreased strength;Decreased activity tolerance;Decreased balance;Decreased mobility;Cardiopulmonary status limiting activity;Pain       PT Treatment Interventions DME  instruction;Gait training;Stair training;Functional mobility training;Therapeutic activities;Therapeutic exercise;Balance training;Neuromuscular re-education;Patient/family education    PT Goals (Current goals can be found in the Care Plan section)  Acute Rehab PT Goals Patient Stated Goal: to go home PT Goal Formulation: With patient Time For Goal Achievement: 08/08/24 Potential to Achieve Goals: Fair    Frequency Min 2X/week     Co-evaluation               AM-PAC PT 6 Clicks Mobility  Outcome Measure Help needed turning from your back to your side while in a flat bed without using bedrails?: A Little Help needed moving from lying on your back to sitting on the side of a flat bed without using bedrails?: A Little Help needed moving to and from a bed to a chair (including a wheelchair)?: A Little Help needed standing up from a chair using your arms (e.g., wheelchair or bedside chair)?: A Little Help needed to walk in hospital room?: A Lot Help needed climbing 3-5 steps with a railing? : A Lot 6 Click Score: 16    End of Session Equipment Utilized During Treatment:  Oxygen Activity Tolerance: Patient limited by fatigue;Patient limited by pain Patient left: in bed;with call bell/phone within reach;with nursing/sitter in room Nurse Communication: Mobility status PT Visit Diagnosis: Other abnormalities of gait and mobility (R26.89);Muscle weakness (generalized) (M62.81);History of falling (Z91.81);Difficulty in walking, not elsewhere classified (R26.2);Pain Pain - Right/Left: Left Pain - part of body:  (ribcage)    Time: 9090-9072 PT Time Calculation (min) (ACUTE ONLY): 18 min   Charges:   PT Evaluation $PT Eval Moderate Complexity: 1 Mod   PT General Charges $$ ACUTE PT VISIT: 1 Visit         Janell Axe, SPT

## 2024-07-25 NOTE — Evaluation (Signed)
 Occupational Therapy Evaluation Patient Details Name: Christine Sparks MRN: [id number] DOB: 1961/08/31 Today's Date: 07/25/2024   History of Present Illness   Pt is a 63 y/o F presenting to ED with c/o weakness, diarrhea, fall at home. CT head and neck negative, CT chest/abdomen found subtle nondisplaced fractures of anterior L 5th and 6th ribs. MD assessment includes hypotonic hyponatremia, COPD exacerbation, cervical stenosis, diarrhea of unknown cause, ETOH use disorder. PMH significant for COPD, HTN, HLD.     Clinical Impressions Chart reviewed to date, pt greeted semi supine in bed, oriented x4 but impaired sustained attention and problem solving noted. Will need to confirm PLOF. She is tremulous throughout. PTA pt reports she is MOD I-I in ADL/IADL, amb with SPC. Pt presents with deficits in strength, endurance, activity tolerance, balance, cognition, affecting safe and optimal ADL completion. Pt is performing ADL/functional mobility below PLOF, will benefit from acute OT to address functional deficits and to facilitate optimal ADL/functional mobility performance.   Spo2 on 1 L to 86% with mobility, >90% on 1 L at rest. OT will continue to follow.      If plan is discharge home, recommend the following:   A little help with walking and/or transfers;A little help with bathing/dressing/bathroom;Help with stairs or ramp for entrance;Direct supervision/assist for medications management;Direct supervision/assist for financial management     Functional Status Assessment   Patient has had a recent decline in their functional status and demonstrates the ability to make significant improvements in function in a reasonable and predictable amount of time.     Equipment Recommendations   BSC/3in1     Recommendations for Other Services         Precautions/Restrictions   Precautions Precautions: Fall Recall of Precautions/Restrictions: Impaired Restrictions Weight Bearing  Restrictions Per Provider Order: No     Mobility Bed Mobility Overal bed mobility: Needs Assistance Bed Mobility: Supine to Sit, Sit to Supine     Supine to sit: Min assist, HOB elevated Sit to supine: Min assist        Transfers Overall transfer level: Needs assistance Equipment used: Rolling walker (2 wheels) Transfers: Sit to/from Stand Sit to Stand: Min assist           General transfer comment: limited standing tolerance on this date      Balance Overall balance assessment: Needs assistance Sitting-balance support: Feet supported Sitting balance-Leahy Scale: Fair     Standing balance support: Bilateral upper extremity supported, During functional activity, Reliant on assistive device for balance Standing balance-Leahy Scale: Fair                             ADL either performed or assessed with clinical judgement   ADL Overall ADL's : Needs assistance/impaired     Grooming: Wash/dry face;Oral care;Sitting;Minimal assistance               Lower Body Dressing: Maximal assistance   Toilet Transfer: Minimal assistance;Rolling walker (2 wheels) Toilet Transfer Details (indicate cue type and reason): simulated, short amb transfer                 Vision Patient Visual Report: No change from baseline Additional Comments: will continue to assess     Perception         Praxis         Pertinent Vitals/Pain Pain Assessment Pain Assessment: 0-10 Pain Score: 7  Pain Location: L side pain Pain Descriptors / Indicators:  Aching, Grimacing, Sharp Pain Intervention(s): Monitored during session, Patient requesting pain meds-RN notified, Limited activity within patient's tolerance     Extremity/Trunk Assessment Upper Extremity Assessment Upper Extremity Assessment: Generalized weakness (tremulous throughout)   Lower Extremity Assessment Lower Extremity Assessment: Generalized weakness       Communication  Communication Communication: No apparent difficulties   Cognition Arousal: Alert Behavior During Therapy: Anxious Cognition: No family/caregiver present to determine baseline, Cognition impaired         Attention impairment (select first level of impairment): Sustained attention Executive functioning impairment (select all impairments): Problem solving, Reasoning                   Following commands: Impaired Following commands impaired: Follows one step commands with increased time     Cueing  General Comments   Cueing Techniques: Verbal cues;Visual cues  spo2 to 86% on 1 L via [place] with standing attempts, >90% on 1L via [place] after approx 30 seconds   Exercises Other Exercises Other Exercises: edu re role of OT, role of rehab, discharge recommendations   Shoulder Instructions      Home Living Family/patient expects to be discharged to:: Private residence Living Arrangements: Alone Available Help at Discharge: Other (Comment);Available PRN/intermittently (cousin lives in [place] , says she is going to call to see if she can stay with her for a bit) Type of Home: House Home Access: Stairs to enter Secretary/administrator of Steps: 4 Entrance Stairs-Rails: Left Home Layout: One level     Bathroom Shower/Tub: Chief Strategy Officer: Standard     Home Equipment: Shower seat;Cane - single Librarian, academic (2 wheels)   Additional Comments: pt reports that she does not have anyone that comes to check on her at home      Prior Functioning/Environment Prior Level of Function : Independent/Modified Independent;Driving;Patient poor historian/Family not available;History of Falls (last six months)             Mobility Comments: amb with SPC ADLs Comments: pt reports MOD I-I with ADL/IADL, will need to confirm    OT Problem List: Decreased strength;Decreased activity tolerance;Impaired balance (sitting and/or standing);Decreased  coordination;Decreased knowledge of use of DME or AE;Decreased safety awareness;Decreased knowledge of precautions;Impaired UE functional use   OT Treatment/Interventions: Self-care/ADL training;Therapeutic activities;Therapeutic exercise;Visual/perceptual remediation/compensation;Energy conservation;Patient/family education;DME and/or AE instruction;Balance training      OT Goals(Current goals can be found in the care plan section)   Acute Rehab OT Goals Patient Stated Goal: go home OT Goal Formulation: With patient Time For Goal Achievement: 08/08/24 Potential to Achieve Goals: Good ADL Goals Pt Will Perform Grooming: with modified independence;sitting Pt Will Perform Lower Body Dressing: with modified independence;sit to/from stand;sitting/lateral leans Pt Will Transfer to Toilet: with modified independence;ambulating Pt Will Perform Toileting - Clothing Manipulation and hygiene: with modified independence;sit to/from stand   OT Frequency:  Min 2X/week    Co-evaluation              AM-PAC OT 6 Clicks Daily Activity     Outcome Measure Help from another person eating meals?: None Help from another person taking care of personal grooming?: A Little Help from another person toileting, which includes using toliet, bedpan, or urinal?: A Lot Help from another person bathing (including washing, rinsing, drying)?: A Lot Help from another person to put on and taking off regular upper body clothing?: A Little Help from another person to put on and taking off regular lower body clothing?: A Lot 6 Click Score:  16   End of Session Equipment Utilized During Treatment: Rolling walker (2 wheels) Nurse Communication: Mobility status  Activity Tolerance: Patient tolerated treatment well Patient left: in bed;with call bell/phone within reach;with bed alarm set  OT Visit Diagnosis: Other abnormalities of gait and mobility (R26.89);Muscle weakness (generalized) (M62.81)                 Time: 9041-8989 OT Time Calculation (min): 12 min Charges:  OT General Charges $OT Visit: 1 Visit OT Evaluation $OT Eval Low Complexity: 1 Low  Silvanna Ohmer, OTD OTR/L  07/25/24, 1:22 PM

## 2024-07-25 NOTE — Progress Notes (Signed)
 Pt presenting w/ SOB and inspiratory and expiratory wheezing throughout lungs. Nebulizer given w/ no effect. MD contacted, new orders of solumedrol and additional nebulizers received.

## 2024-07-25 NOTE — Progress Notes (Signed)
 [facility] Progress Note  Patient: Christine Sparks    FMW:[id number]  DOA: 07/24/2024     Date of Service: the patient was seen and examined on 07/25/2024  Chief Complaint  Patient presents with   Diarrhea   Brief hospital course: Christine Sparks is a 63 y.o. female with Past medical history of COPD, HTN, HLD, as reviewed from EMR, presented to [facility] ED with complaining of feeling weak and fell at home and did not left-sided ribs.  Complaining of pain on the left side and also mentioned to diarrhea for past 3 days with nausea, no vomiting.  Patient was not sure of hitting her head, denies any neck pain or back pain.  Patient reported some wheezing and feeling anxious.  No cough or congestion.  EMS did not notice any hypoxia, but placed on oxygen for comfort and anxiety. Patient drinks 5 to 6 glasses of wine every day, denies any drug abuse but she does smoke cigarettes.  Patient is trying to quit but is still she smokes off-and-on.  ED workup: Patient was found to have hyponatremia Left 5th and 6th rib fracture.  Nondisplaced EtOH abuse, placed on CIWA protocol.  TRH was consulted for admission and further management as below.  Assessment and Plan:    # Hypotonic hyponatremia Presented with generalized weakness and fall Serum osmolality 262, low Na 122 Continue fluid restriction 1.5 to 2/day Salt tablets 2 g twice daily Continue IV fluid for hydration Na 122>>128 Check sodium level daily Follow PT and OT eval   # COPD exacerbation S/p Solu-Medrol  125 mg given in the ED S/p Breztri  inhaler Started Mucinex  600 mg p.o. twice daily DuoNeb every 6 hourly 10/23 worsening of COPD, started Brovana  and Pulmicort  nebulizer twice daily Solu-Medrol  125 mg IV x 1 dose given, started Solu-Medrol  40 mg IV twice daily followed by tapering dose prednisone  CXR: Negative for any infiltrate    # Left 5th and 6th rib fracture, nondisplaced. S/p fall at home secondary to dehydration due to  diarrhea Continue as needed meds for pain control   # Cervical stenosis Continue as needed medication for pain control Recommend to follow with PCP and spine surgery as an outpatient if she experience any problems in the future.     # Diarrhea, unknown cause. Resolved  Last Diarrheal disorder was on 10/21 before admission Send stool sample for C. difficile and GI pathogen if recurrent diarrhea Keep on contact isolation Continue IV fluid for hydration Started probiotics Use PPI for GI prophylaxis   # EtOH use disorder: Patient is counseling done Patient drinks 5 to 6 glasses of wine daily Continue CIWA protocol and monitor for withdrawals Started multivitamin, folate and thiamine      # Nicotine  dependence, patient smokes cigarettes off-and-on to quit. Use nicotine  patch.  Smoking cessation counseling done.   # Hypertension Resume the lisinopril  10 mg p.o. daily home dose Use hydralazine  as needed Monitor BP and titrate medication accordingly   # Depression and anxiety Resumed Ativan  as needed home dose Patient used to take Celexa  but not taking anymore.   Vitamin B12 level 221, goal >400.   Started vitamin B12 1000 mcg IM injection daily during hospital stay, followed by oral supplement.  Follow-up PCP to repeat vitamin B12 level after 3 to 6 months.   Body mass index is 20.18 kg/m.  Interventions:   Diet: Cardiac diet DVT Prophylaxis: Subcutaneous Lovenox    Advance goals of care discussion: Full code  Family Communication: family was  not present at bedside, at the time of interview.  The pt provided permission to discuss medical plan with the family. Opportunity was given to ask question and all questions were answered satisfactorily.   Disposition:  Pt is from Home, admitted with hyponatremia, falls, left 5th and 6th fracture, EtOH abuse/withdrawal symptoms, still has withdrawal symptoms and intractable pain and low sodium, which precludes a safe  discharge. Discharge to home, when stable, may need a few days to improve.  Subjective: No acute events overnight, in the morning time patient was having cough and sputum production and shortness of breath, COPD exacerbation.  Denies any diarrhea. Withdrawal symptoms due to alcohol Left chest wall pain 5/10, worse with deep inspiration   Physical Exam: General: NAD, lying comfortably Appear in no distress, affect appropriate Eyes: PERRLA ENT: Oral Mucosa Clear, moist  Neck: no JVD,  Cardiovascular: S1 and S2 Present, no Murmur,  Respiratory: Increased respiratory effort, mild crackles and significant wheezes bilaterally Abdomen: Bowel Sound present, Soft and no tenderness,  Skin: no rashes Extremities: no Pedal edema, no calf tenderness Neurologic: without any new focal findings Gait not checked due to patient safety concerns  Vitals:   07/25/24 0620 07/25/24 1205 07/25/24 1457 07/25/24 1512  BP: (!) 142/83 116/71 (!) 156/76 (!) 158/92  Pulse: 79 86 99 91  Resp:    16  Temp:    98 F (36.7 C)  TempSrc:      SpO2:    100%  Weight:      Height:        Intake/Output Summary (Last 24 hours) at 07/25/2024 1530 Last data filed at 07/25/2024 0410 Gross per 24 hour  Intake --  Output 700 ml  Net -700 ml   Filed Weights   07/24/24 0416  Weight: 55 kg    Data Reviewed: I have personally reviewed and interpreted daily labs, tele strips, imagings as discussed above. I reviewed all nursing notes, pharmacy notes, vitals, pertinent old records I have discussed plan of care as described above with RN and patient/family.  CBC: Recent Labs  Lab 07/24/24 0417 07/25/24 0408  WBC 10.4 11.1*  NEUTROABS 8.3*  --   HGB 13.8 10.5*  HCT 38.3 29.4*  MCV 88.7 91.9  PLT 447* 335   Basic Metabolic Panel: Recent Labs  Lab 07/24/24 0417 07/24/24 1105 07/25/24 0408  NA 122*  --  128*  K 4.1  --  4.7  CL 86*  --  97*  CO2 26  --  24  GLUCOSE 102*  --  101*  BUN <5*  --  9   CREATININE 0.59  --  0.57  CALCIUM  8.6*  --  8.7*  MG 1.8  --  1.9  PHOS  --  4.1 4.0    Studies: DG Chest Port 1 View Result Date: 07/25/2024 EXAM: 1 VIEW XRAY OF THE CHEST 07/25/2024 10:04:21 AM COMPARISON: Chest radiograph 03/26/2024. CLINICAL HISTORY: SOB (shortness of breath) FINDINGS: LUNGS AND PLEURA: No focal pulmonary opacity. No overt pulmonary edema. No pleural effusion. No pneumothorax. HEART AND MEDIASTINUM: Aortic atherosclerosis. No acute abnormality of the cardiac and mediastinal silhouettes. BONES AND SOFT TISSUES: No acute osseous abnormality. IMPRESSION: 1. No acute cardiopulmonary findings. 2. Emphysema. 3. Aortic atherosclerosis. Electronically signed by: Shahmeer Lateef MD 07/25/2024 10:35 AM EDT RP Workstation: [id number]    Scheduled Meds:  arformoterol   15 mcg Nebulization BID   And   budesonide  (PULMICORT ) nebulizer solution  0.25 mg Nebulization BID   cyanocobalamin   1,000  mcg Intramuscular Daily   Followed by   NOREEN ON 08/01/2024] vitamin B-12  1,000 mcg Oral Daily   enoxaparin  (LOVENOX ) injection  40 mg Subcutaneous Q24H   folic acid   1 mg Oral Daily   guaiFENesin   600 mg Oral BID   ipratropium-albuterol   3 mL Nebulization Once   multivitamin with minerals  1 tablet Oral Daily   pantoprazole   40 mg Oral BID   ramipril   10 mg Oral Daily   saccharomyces boulardii  250 mg Oral BID   sodium chloride  flush  3 mL Intravenous Q12H   sodium chloride  flush  3 mL Intravenous Q12H   sodium chloride   2 g Oral BID WC   thiamine   100 mg Oral Daily   Or   thiamine   100 mg Intravenous Daily   Continuous Infusions:  sodium chloride  75 mL/hr at 07/25/24 1121   PRN Meds: acetaminophen  **OR** acetaminophen , haloperidol lactate, hydrALAZINE , HYDROmorphone (DILAUDID) injection, ipratropium-albuterol , LORazepam  **OR** LORazepam , LORazepam , ondansetron  **OR** ondansetron  (ZOFRAN ) IV, oxyCODONE , sodium chloride  flush  Time spent: 55 minutes  Author: ELVAN SOR.  MD [facility] 07/25/2024 3:30 PM  To reach On-call, see care teams to locate the attending and reach out to them via [email or web address]. If 7PM-7AM, please contact night-coverage If you still have difficulty reaching the attending provider, please page the [facility] (Director on Call) for [facility] on amion for assistance.

## 2024-07-26 DIAGNOSIS — E871 Hypo-osmolality and hyponatremia: Secondary | ICD-10-CM | POA: Diagnosis not present

## 2024-07-26 LAB — BASIC METABOLIC PANEL WITH GFR
Anion gap: 8 (ref 5–15)
BUN: 12 mg/dL (ref 8–23)
CO2: 23 mmol/L (ref 22–32)
Calcium: 8.7 mg/dL — ABNORMAL LOW (ref 8.9–10.3)
Chloride: 99 mmol/L (ref 98–111)
Creatinine, Ser: 0.62 mg/dL (ref 0.44–1.00)
GFR, Estimated: >60 mL/min (ref >60)
Glucose, Bld: 113 mg/dL — ABNORMAL HIGH (ref 70–99)
Potassium: 4.3 mmol/L (ref 3.5–5.1)
Sodium: 130 mmol/L — ABNORMAL LOW (ref 135–145)

## 2024-07-26 LAB — CBC
HCT: 29.9 % — ABNORMAL LOW (ref 36.0–46.0)
Hemoglobin: 10.1 g/dL — ABNORMAL LOW (ref 12.0–15.0)
MCH: 32.3 pg (ref 26.0–34.0)
MCHC: 33.8 g/dL (ref 30.0–36.0)
MCV: 95.5 fL (ref 80.0–100.0)
Platelets: 314 K/uL (ref 150–400)
RBC: 3.13 MIL/uL — ABNORMAL LOW (ref 3.87–5.11)
RDW: 12.6 % (ref 11.5–15.5)
WBC: 10.5 K/uL (ref 4.0–10.5)
nRBC: 0 % (ref 0.0–0.2)

## 2024-07-26 LAB — HEPATIC FUNCTION PANEL
ALT: 26 U/L (ref 0–44)
AST: 29 U/L (ref 15–41)
Albumin: 3.2 g/dL — ABNORMAL LOW (ref 3.5–5.0)
Alkaline Phosphatase: 66 U/L (ref 38–126)
Bilirubin, Direct: 0.2 mg/dL (ref 0.0–0.2)
Indirect Bilirubin: 0.5 mg/dL (ref 0.3–0.9)
Total Bilirubin: 0.7 mg/dL (ref 0.0–1.2)
Total Protein: 5.6 g/dL — ABNORMAL LOW (ref 6.5–8.1)

## 2024-07-26 LAB — PHOSPHORUS: Phosphorus: 4 mg/dL (ref 2.5–4.6)

## 2024-07-26 LAB — MAGNESIUM: Magnesium: 2 mg/dL (ref 1.7–2.4)

## 2024-07-26 LAB — AMMONIA: Ammonia: <13 umol/L (ref 9–35)

## 2024-07-26 MED ORDER — HYDROCODONE-ACETAMINOPHEN 5-325 MG PO TABS
1.0000 | ORAL_TABLET | Freq: Four times a day (QID) | ORAL | Status: DC | PRN
  Administered 2024-07-26 – 2024-08-01 (×14): 1 via ORAL
  Filled 2024-07-26 (×15): qty 1

## 2024-07-26 MED ORDER — DIAZEPAM 5 MG PO TABS
5.0000 mg | ORAL_TABLET | Freq: Three times a day (TID) | ORAL | Status: AC
  Administered 2024-07-27 (×3): 5 mg via ORAL
  Filled 2024-07-26 (×3): qty 1

## 2024-07-26 MED ORDER — DIAZEPAM 5 MG PO TABS
5.0000 mg | ORAL_TABLET | Freq: Two times a day (BID) | ORAL | Status: AC
  Administered 2024-07-28 (×2): 5 mg via ORAL
  Filled 2024-07-26 (×2): qty 1

## 2024-07-26 MED ORDER — DIAZEPAM 5 MG PO TABS
5.0000 mg | ORAL_TABLET | Freq: Four times a day (QID) | ORAL | Status: AC
  Administered 2024-07-26 – 2024-07-27 (×3): 5 mg via ORAL
  Filled 2024-07-26 (×3): qty 1

## 2024-07-26 MED ORDER — IPRATROPIUM-ALBUTEROL 0.5-2.5 (3) MG/3ML IN SOLN
3.0000 mL | RESPIRATORY_TRACT | Status: DC | PRN
  Administered 2024-07-26 – 2024-07-29 (×5): 3 mL via RESPIRATORY_TRACT
  Filled 2024-07-26 (×5): qty 3

## 2024-07-26 MED ORDER — DIAZEPAM 5 MG PO TABS
5.0000 mg | ORAL_TABLET | Freq: Every day | ORAL | Status: AC
  Administered 2024-07-29: 5 mg via ORAL
  Filled 2024-07-26: qty 1

## 2024-07-26 NOTE — Progress Notes (Signed)
 OT Cancellation Note  Patient Details Name: Christine Sparks MRN: [id number] DOB: [birth year]   Cancelled Treatment:    Reason Eval/Treat Not Completed: Fatigue/lethargy limiting ability to participate. Pt participated in PT session and has very poor activity tolerance. Pt too fatigued to participate in OT at this time, OT will continue to follow and check back at later date.   Vernel Donlan L. Makaio Mach, OTR/L  07/26/24, 4:41 PM

## 2024-07-26 NOTE — Plan of Care (Signed)
   Problem: Education: Goal: Knowledge of General Education information will improve Description Including pain rating scale, medication(s)/side effects and non-pharmacologic comfort measures Outcome: Progressing

## 2024-07-26 NOTE — Progress Notes (Signed)
 [facility] Progress Note  Patient: Christine Sparks    FMW:[id number]  DOA: 07/24/2024     Date of Service: the patient was seen and examined on 07/26/2024  Chief Complaint  Patient presents with   Diarrhea   Brief hospital course: Christine Sparks is a 63 y.o. female with Past medical history of COPD, HTN, HLD, as reviewed from EMR, presented to [facility] ED with complaining of feeling weak and fell at home and did not left-sided ribs.  Complaining of pain on the left side and also mentioned to diarrhea for past 3 days with nausea, no vomiting.  Patient was not sure of hitting her head, denies any neck pain or back pain.  Patient reported some wheezing and feeling anxious.  No cough or congestion.  EMS did not notice any hypoxia, but placed on oxygen for comfort and anxiety. Patient drinks 5 to 6 glasses of wine every day, denies any drug abuse but she does smoke cigarettes.  Patient is trying to quit but is still she smokes off-and-on.  ED workup: Patient was found to have hyponatremia Left 5th and 6th rib fracture.  Nondisplaced EtOH abuse, placed on CIWA protocol.  TR[facility] was consulted for admission and further management as below.  Assessment and Plan:    # Hypotonic hyponatremia Presented with generalized weakness and fall Serum osmolality 262, low Na 122 Continue fluid restriction 1.5 to 2/day Salt tablets 2 g twice daily Continue IV fluid for hydration Na 122>>128 Check sodium level daily Follow PT and OT eval   # COPD exacerbation S/p Solu-Medrol  125 mg given in the ED S/p Breztri  inhaler Started Mucinex  600 mg p.o. twice daily DuoNeb every 4 hourly 10/23 worsening of COPD, started Brovana  and Pulmicort  nebulizer twice daily Solu-Medrol  125 mg IV x 1 dose given, started Solu-Medrol  40 mg IV twice daily followed by tapering dose prednisone  CXR: Negative for any infiltrate    # Left 5th and 6th rib fracture, nondisplaced. S/p fall at home secondary to dehydration due to  diarrhea Continue as needed meds for pain control   # Cervical stenosis Continue as needed medication for pain control Recommend to follow with PCP and spine surgery as an outpatient if she experience any problems in the future.     # Diarrhea, unknown cause. Resolved  Last Diarrheal disorder was on 10/21 before admission Send stool sample for C. difficile and GI pathogen if recurrent diarrhea Keep on contact isolation Continue IV fluid for hydration Started probiotics Use PPI for GI prophylaxis   # EtOH use disorder: Patient is counseling done Patient drinks 5 to 6 glasses of wine daily Continue CIWA protocol and monitor for withdrawals Started multivitamin, folate and thiamine  10/24 started Valium  tapering dose for detox as patient is continuously having withdrawal symptoms.    # Nicotine  dependence, patient smokes cigarettes off-and-on to quit. Use nicotine  patch.  Smoking cessation counseling done.   # Hypertension Resume the lisinopril  10 mg p.o. daily home dose Use hydralazine  as needed Monitor BP and titrate medication accordingly   # Depression and anxiety Resumed Ativan  as needed home dose Patient used to take Celexa  but not taking anymore.   Vitamin B12 level 221, goal >400.   Started vitamin B12 1000 mcg IM injection daily during hospital stay, followed by oral supplement.  Follow-up PCP to repeat vitamin B12 level after 3 to 6 months.   Body mass index is 20.18 kg/m.  Interventions:   Diet: Cardiac diet DVT Prophylaxis: Subcutaneous Lovenox   Advance goals of care discussion: Full code  Family Communication: family was not present at bedside, at the time of interview.  The pt provided permission to discuss medical plan with the family. Opportunity was given to ask question and all questions were answered satisfactorily.   Disposition:  Pt is from Home, admitted with hyponatremia, falls, left 5th and 6th fracture, EtOH abuse/withdrawal symptoms, still has  withdrawal symptoms and intractable pain and low sodium, which precludes a safe discharge. Discharge to home, when stable, may need a few days to improve.  Subjective: No acute events overnight, patient as well as significant shortness of breath, productive cough.  Withdrawal symptoms.  Pain is under control. Patient agreed for Valium  detox for few days.   Physical Exam: General: NAD, lying comfortably Appear in no distress, affect appropriate Eyes: PERRLA ENT: Oral Mucosa Clear, moist  Neck: no JVD,  Cardiovascular: S1 and S2 Present, no Murmur,  Respiratory: Increased respiratory effort, mild crackles and  wheezes bilaterally, gradually improving Abdomen: Bowel Sound present, Soft and no tenderness,  Skin: no rashes Extremities: no Pedal edema, no calf tenderness Neurologic: without any new focal findings Gait not checked due to patient safety concerns  Vitals:   07/25/24 2237 07/26/24 0450 07/26/24 0750 07/26/24 0935  BP: 132/72 (!) 140/72 (!) 177/88 (!) 153/80  Pulse: 92 85 (!) 102 93  Resp:  17 20   Temp:  97.6 F (36.4 C) 98.5 F (36.9 C)   TempSrc:   Oral   SpO2:  99% 96%   Weight:      Height:        Intake/Output Summary (Last 24 hours) at 07/26/2024 1449 Last data filed at 07/26/2024 0900 Gross per 24 hour  Intake 480 ml  Output 550 ml  Net -70 ml   Filed Weights   07/24/24 0416  Weight: 55 kg    Data Reviewed: I have personally reviewed and interpreted daily labs, tele strips, imagings as discussed above. I reviewed all nursing notes, pharmacy notes, vitals, pertinent old records I have discussed plan of care as described above with RN and patient/family.  CBC: Recent Labs  Lab 07/24/24 0417 07/25/24 0408 07/26/24 0429  WBC 10.4 11.1* 10.5  NEUTROABS 8.3*  --   --   HGB 13.8 10.5* 10.1*  HCT 38.3 29.4* 29.9*  MCV 88.7 91.9 95.5  PLT 447* 335 314   Basic Metabolic Panel: Recent Labs  Lab 07/24/24 0417 07/24/24 1105 07/25/24 0408  07/26/24 0429  NA 122*  --  128* 130*  K 4.1  --  4.7 4.3  CL 86*  --  97* 99  CO2 26  --  24 23  GLUCOSE 102*  --  101* 113*  BUN <5*  --  9 12  CREATININE 0.59  --  0.57 0.62  CALCIUM  8.6*  --  8.7* 8.7*  MG 1.8  --  1.9 2.0  PHOS  --  4.1 4.0 4.0    Studies: No results found.   Scheduled Meds:  acetaminophen   650 mg Oral TID   arformoterol   15 mcg Nebulization BID   And   budesonide  (PULMICORT ) nebulizer solution  0.25 mg Nebulization BID   cyanocobalamin   1,000 mcg Intramuscular Daily   Followed by   NOREEN ON 08/01/2024] vitamin B-12  1,000 mcg Oral Daily   diazepam   5 mg Oral Q6H   Followed by   NOREEN ON 07/27/2024] diazepam   5 mg Oral TID   Followed by   NOREEN  ON 07/28/2024] diazepam   5 mg Oral BID   Followed by   NOREEN ON 07/29/2024] diazepam   5 mg Oral Q1200   enoxaparin  (LOVENOX ) injection  40 mg Subcutaneous Q24H   folic acid   1 mg Oral Daily   guaiFENesin   600 mg Oral BID   ipratropium-albuterol   3 mL Nebulization Once   methylPREDNISolone  (SOLU-MEDROL ) injection  40 mg Intravenous BID   Followed by   NOREEN ON 07/28/2024] predniSONE   40 mg Oral Q breakfast   multivitamin with minerals  1 tablet Oral Daily   pantoprazole   40 mg Oral BID   ramipril   10 mg Oral Daily   saccharomyces boulardii  250 mg Oral BID   sodium chloride  flush  3 mL Intravenous Q12H   sodium chloride  flush  3 mL Intravenous Q12H   sodium chloride   2 g Oral BID WC   thiamine   100 mg Oral Daily   Or   thiamine   100 mg Intravenous Daily   Continuous Infusions:  sodium chloride  75 mL/hr at 07/26/24 0358   PRN Meds: haloperidol lactate, hydrALAZINE , HYDROcodone-acetaminophen , ipratropium-albuterol , LORazepam  **OR** LORazepam , LORazepam , ondansetron  **OR** ondansetron  (ZOFRAN ) IV, sodium chloride  flush  Time spent: 55 minutes  Author: ELVAN SOR. MD [facility] 07/26/2024 2:49 PM  To reach On-call, see care teams to locate the attending and reach out to them via  [email or web address]. If 7PM-7AM, please contact night-coverage If you still have difficulty reaching the attending provider, please page the [facility] (Director on Call) for [facility] on amion for assistance.

## 2024-07-26 NOTE — Plan of Care (Signed)
  Problem: Education: Goal: Knowledge of General Education information will improve Description: Including pain rating scale, medication(s)/side effects and non-pharmacologic comfort measures Outcome: Progressing   Problem: Health Behavior/Discharge Planning: Goal: Ability to manage health-related needs will improve Outcome: Progressing   Problem: Clinical Measurements: Goal: Ability to maintain clinical measurements within normal limits will improve Outcome: Progressing Goal: Will remain Nada Godley from infection Outcome: Progressing Goal: Diagnostic test results will improve Outcome: Progressing Goal: Cardiovascular complication will be avoided Outcome: Progressing   Problem: Nutrition: Goal: Adequate nutrition will be maintained Outcome: Progressing   Problem: Coping: Goal: Level of anxiety will decrease Outcome: Progressing   Problem: Elimination: Goal: Will not experience complications related to bowel motility Outcome: Progressing Goal: Will not experience complications related to urinary retention Outcome: Progressing   Problem: Pain Managment: Goal: General experience of comfort will improve and/or be controlled Outcome: Progressing   Problem: Safety: Goal: Ability to remain Asmara Backs from injury will improve Outcome: Progressing   Problem: Skin Integrity: Goal: Risk for impaired skin integrity will decrease Outcome: Progressing   Problem: Clinical Measurements: Goal: Respiratory complications will improve Outcome: Not Progressing Note: Pt receiving nebulizers as indicated. Pt continues to have wheezing throughout lungs, intermittent SOB, and remains dependant on 1-2 L O2.   Problem: Activity: Goal: Risk for activity intolerance will decrease Outcome: Not Progressing Note: Pt refusing to stand and walk for RN.

## 2024-07-26 NOTE — Progress Notes (Signed)
 Physical Therapy Treatment Patient Details Name: Christine Sparks MRN: [id number] DOB: 1961-03-05 Today's Date: 07/26/2024   History of Present Illness Pt is a 63 y/o F presenting to ED with c/o weakness, diarrhea, fall at home. CT head and neck negative, CT chest/abdomen found subtle nondisplaced fractures of anterior L 5th and 6th ribs. MD assessment includes hypotonic hyponatremia, COPD exacerbation, cervical stenosis, diarrhea of unknown cause, ETOH use disorder. PMH significant for COPD, HTN, HLD.    PT Comments  Pt alert and agreeable to participate in PT tx. Pt was received in bed, stated that she had been seated in recliner for 3 hours today and just came back to bed. Pt was CGA for bed mobility this date with use of bed features and inc time/effort. Pt educated on use of pillow for bracing and comfort with mobility due to rib pain with exertion. Pt performed 3 total STS from EOB with RW with minA to stabilize RW. Pt very tremulous in standing and with mobility this date, from previous interaction pt believes it is due to alcohol withdrawal. Pt able to take several side-steps toward [facility] with no LOB, very fatigued with short bouts of mobility. Pt was initially on 1.5L at rest upon entry, desatted to 84% with bed mobility- RN called during session and approved titration to 3L. Pt maintained on 3L during session with SpO2 WFL. Pt was left semi-supine in bed at end of session, on 3L with RN notified, all needs in reach. The patient would benefit from further skilled PT intervention to continue to progress towards goals.      If plan is discharge home, recommend the following: A little help with walking and/or transfers;A little help with bathing/dressing/bathroom;Assistance with cooking/housework;Assist for transportation;Help with stairs or ramp for entrance   Can travel by private vehicle     Yes  Equipment Recommendations  Other (comment) (TBD)    Recommendations for Other Services        Precautions / Restrictions Precautions Precautions: Fall Recall of Precautions/Restrictions: Impaired Restrictions Weight Bearing Restrictions Per Provider Order: No     Mobility  Bed Mobility Overal bed mobility: Needs Assistance Bed Mobility: Supine to Sit, Sit to Supine     Supine to sit: Contact guard, HOB elevated, Used rails Sit to supine: Contact guard assist   General bed mobility comments: no physical assistance required, use of bed features and increased time/effort to complete bed mobility    Transfers Overall transfer level: Needs assistance Equipment used: Rolling walker (2 wheels) Transfers: Sit to/from Stand Sit to Stand: Min assist           General transfer comment: 3 STS from EOB with minA to stabilize RW. Very effortful for pt to complete transfers    Ambulation/Gait Ambulation/Gait assistance: Contact guard assist Gait Distance (Feet): 3 Feet Assistive device: Rolling walker (2 wheels) Gait Pattern/deviations: Step-to pattern, Narrow base of support Gait velocity: decreased     General Gait Details: Pt able to take several side-steps toward [facility] with CGA- very tremulous with exertion and increased SOB with activity. No LOB, min VC for step sequencing   Stairs             Wheelchair Mobility     Tilt Bed    Modified Rankin (Stroke Patients Only)       Balance Overall balance assessment: Needs assistance Sitting-balance support: Feet supported Sitting balance-Leahy Scale: Fair Sitting balance - Comments: tremulous in sitting but able to maintain balance with UE support- pt  reported could be due to alcohol withdrawal   Standing balance support: Bilateral upper extremity supported, Reliant on assistive device for balance Standing balance-Leahy Scale: Fair Standing balance comment: heavy BUE support on RW                            Communication Communication Communication: No apparent difficulties  Cognition  Arousal: Alert Behavior During Therapy: Anxious   PT - Cognitive impairments: No apparent impairments                       PT - Cognition Comments: A&Ox4 Following commands: Impaired Following commands impaired: Follows one step commands with increased time    Cueing Cueing Techniques: Verbal cues, Visual cues  Exercises Other Exercises Other Exercises: HR and SpO2 monitored during session: pt initially on 1.5L [place] HR 100bpm SpO2 92%, pt desatted to 84% HR 103bpm with bed mobility- RN called and approved to titrate to 3L, SpO2 improved to 96%. After short bout of ambulation/STS activity SpO2 92%, improved to 94-95% with HR 94bpm when pt was returned to supine with rest.    General Comments        Pertinent Vitals/Pain Pain Assessment Pain Assessment: 0-10 Pain Score: 6  Pain Location: L side pain Pain Descriptors / Indicators: Aching, Grimacing, Sharp Pain Intervention(s): Limited activity within patient's tolerance, Monitored during session, Repositioned    Home Living                          Prior Function            PT Goals (current goals can now be found in the care plan section) Progress towards PT goals: Progressing toward goals    Frequency    Min 2X/week      PT Plan      Co-evaluation              AM-PAC PT 6 Clicks Mobility   Outcome Measure  Help needed turning from your back to your side while in a flat bed without using bedrails?: A Little Help needed moving from lying on your back to sitting on the side of a flat bed without using bedrails?: A Little Help needed moving to and from a bed to a chair (including a wheelchair)?: A Little Help needed standing up from a chair using your arms (e.g., wheelchair or bedside chair)?: A Little Help needed to walk in hospital room?: A Lot Help needed climbing 3-5 steps with a railing? : A Lot 6 Click Score: 16    End of Session Equipment Utilized During Treatment: Gait  belt;Oxygen Activity Tolerance: Patient limited by fatigue Patient left: in bed;with call bell/phone within reach;with bed alarm set Nurse Communication: Mobility status PT Visit Diagnosis: Other abnormalities of gait and mobility (R26.89);Muscle weakness (generalized) (M62.81);History of falling (Z91.81);Difficulty in walking, not elsewhere classified (R26.2);Pain Pain - Right/Left: Left Pain - part of body:  (ribcage)     Time: 8574-8544 PT Time Calculation (min) (ACUTE ONLY): 30 min  Charges:    $Therapeutic Activity: 23-37 mins PT General Charges $$ ACUTE PT VISIT: 1 Visit                     Janell Axe, SPT

## 2024-07-26 NOTE — Progress Notes (Signed)
 Pt SOB w/ inspiratory and expiratory wheezing throughout lungs. SpO2 97% on 2L. MD Von contacted. Duoneb changed to Q4 PRN. Reassessment post nebulizer: pt w/ mild expiratory wheezing throughout lungs, reports no SOB.

## 2024-07-27 DIAGNOSIS — E871 Hypo-osmolality and hyponatremia: Secondary | ICD-10-CM | POA: Diagnosis not present

## 2024-07-27 LAB — BASIC METABOLIC PANEL WITH GFR
Anion gap: 9 (ref 5–15)
BUN: 11 mg/dL (ref 8–23)
CO2: 23 mmol/L (ref 22–32)
Calcium: 8.9 mg/dL (ref 8.9–10.3)
Chloride: 96 mmol/L — ABNORMAL LOW (ref 98–111)
Creatinine, Ser: 0.64 mg/dL (ref 0.44–1.00)
GFR, Estimated: >60 mL/min (ref >60)
Glucose, Bld: 102 mg/dL — ABNORMAL HIGH (ref 70–99)
Potassium: 4.2 mmol/L (ref 3.5–5.1)
Sodium: 128 mmol/L — ABNORMAL LOW (ref 135–145)

## 2024-07-27 LAB — RESPIRATORY PANEL BY PCR

## 2024-07-27 LAB — D-DIMER, QUANTITATIVE: D-Dimer, Quant: 1.4 ug{FEU}/mL — ABNORMAL HIGH (ref 0.00–0.50)

## 2024-07-27 LAB — CBC
HCT: 31.7 % — ABNORMAL LOW (ref 36.0–46.0)
Hemoglobin: 10.7 g/dL — ABNORMAL LOW (ref 12.0–15.0)
MCH: 32.1 pg (ref 26.0–34.0)
MCHC: 33.8 g/dL (ref 30.0–36.0)
MCV: 95.2 fL (ref 80.0–100.0)
Platelets: 338 K/uL (ref 150–400)
RBC: 3.33 MIL/uL — ABNORMAL LOW (ref 3.87–5.11)
RDW: 12.5 % (ref 11.5–15.5)
WBC: 10.2 K/uL (ref 4.0–10.5)
nRBC: 0 % (ref 0.0–0.2)

## 2024-07-27 LAB — PHOSPHORUS: Phosphorus: 3.9 mg/dL (ref 2.5–4.6)

## 2024-07-27 LAB — MAGNESIUM: Magnesium: 2.1 mg/dL (ref 1.7–2.4)

## 2024-07-27 MED ORDER — AMLODIPINE BESYLATE 10 MG PO TABS
10.0000 mg | ORAL_TABLET | Freq: Every day | ORAL | Status: DC
  Administered 2024-07-27 – 2024-08-01 (×6): 10 mg via ORAL
  Filled 2024-07-27 (×6): qty 1

## 2024-07-27 MED ORDER — BISACODYL 5 MG PO TBEC
10.0000 mg | DELAYED_RELEASE_TABLET | Freq: Once | ORAL | Status: AC
  Administered 2024-07-27: 10 mg via ORAL
  Filled 2024-07-27: qty 2

## 2024-07-27 MED ORDER — CITALOPRAM HYDROBROMIDE 10 MG PO TABS
40.0000 mg | ORAL_TABLET | Freq: Every day | ORAL | Status: DC
  Administered 2024-07-27 – 2024-07-30 (×4): 40 mg via ORAL
  Filled 2024-07-27 (×5): qty 4

## 2024-07-27 MED ORDER — BISACODYL 5 MG PO TBEC
10.0000 mg | DELAYED_RELEASE_TABLET | Freq: Every day | ORAL | Status: DC
  Administered 2024-07-27 – 2024-07-31 (×5): 10 mg via ORAL
  Filled 2024-07-27 (×5): qty 2

## 2024-07-27 MED ORDER — BISACODYL 10 MG RE SUPP
10.0000 mg | Freq: Every day | RECTAL | Status: DC | PRN
  Administered 2024-07-28: 10 mg via RECTAL
  Filled 2024-07-27 (×2): qty 1

## 2024-07-27 MED ORDER — HYDROMORPHONE HCL 1 MG/ML IJ SOLN
1.0000 mg | Freq: Once | INTRAMUSCULAR | Status: AC
  Administered 2024-07-27: 1 mg via INTRAVENOUS
  Filled 2024-07-27: qty 1

## 2024-07-27 MED ORDER — POLYETHYLENE GLYCOL 3350 17 G PO PACK
17.0000 g | PACK | Freq: Two times a day (BID) | ORAL | Status: DC
  Administered 2024-07-27 – 2024-08-01 (×7): 17 g via ORAL
  Filled 2024-07-27 (×11): qty 1

## 2024-07-27 NOTE — Consult Note (Signed)
 PULMONOLOGY         Date: 07/27/2024,   MRN# [id number] Christine Sparks March 26, 1961     AdmissionWeight: 55 kg                 CurrentWeight: 55 kg  Referring provider: Dr Von   CHIEF COMPLAINT:   Chest pain and wheezing   HISTORY OF PRESENT ILLNESS     63 y.o. female with PMH of alcoholism, smoking COPD HTN, HLD who reports having diarreah for few days and fell with resultant trauma to thorax and had left rib fractures.  She was sent home on oxygen in Dec 2024 and was weaned off oxygen after few week.  She reports having withrdawal symptoms from alcoholism this week.   She denies ever having seizures. She is currently on 3L/min and reports chest discomfort but is oriented x 3 and is non labored with breathing while giving interview. Imaging showed Trace retained secretions in the trachea. Central airways are patent. Occasional peripheral airway opacification due to retained secretion such as in the right lower lobe.  Patient admits to choking on food and aspiration sometimes but seldom. pCCM consultation for further evaluation and management regarding hypemia, chest discomfort and possible PE.   PAST MEDICAL HISTORY   Past Medical History:  Diagnosis Date   Acute respiratory failure with hypoxia (HCC) 09/19/2023   Anemia    Anxiety    Arthritis    Bronchitis, chronic obstructive (HCC)    COPD exacerbation (HCC) 06/20/2023   Depression    Diabetes mellitus 10/2010   a1c 5.7, fasting glu 155   Dyspnea    with exertion   H/O bronchitis    Heart murmur    High cholesterol    Hypertension    Menopausal syndrome    NSTEMI (non-ST elevated myocardial infarction) (HCC) 09/19/2023   Tobacco abuse      SURGICAL HISTORY   Past Surgical History:  Procedure Laterality Date   BREAST BIOPSY Left 07/26/2016   complex sclerosing lesion. Coil shape marker   BREAST LUMPECTOMY Left 08/17/2016   complex sclerosing lesion with focal ADH. Clear margins. The coil shaped  biopsy marker remains    BREAST LUMPECTOMY WITH NEEDLE LOCALIZATION Left 08/17/2016   Procedure: BREAST LUMPECTOMY WITH NEEDLE LOCALIZATION;  Surgeon: Louanne KANDICE Muse, MD;  Location: [facility] ORS;  Service: General;  Laterality: Left;   CERVICAL CONE BIOPSY     DILATION AND CURETTAGE OF UTERUS     EXCISION / BIOPSY BREAST / NIPPLE / DUCT Left 08/17/2016   complex sclerosing lesion removed   LIPOMA EXCISION Right    benign tumor right leg   LYMPH NODE BIOPSY     TONSILLECTOMY       FAMILY HISTORY   Family History  Problem Relation Age of Onset   Cancer Mother        melanoma   Cancer Father        Lung Cancer   Early death Brother    Heart disease Brother    Breast cancer Paternal Aunt 54   Colon cancer Other    Ovarian cancer Neg Hx    Diabetes Neg Hx      SOCIAL HISTORY   Social History   Tobacco Use   Smoking status: Every Day    Average packs/day: 0.5 packs/day for 45.1 years (22.6 ttl pk-yrs)    Types: Cigarettes    Start date: 10/03/1978    Passive exposure: Past   Smokeless tobacco:  Never  Vaping Use   Vaping status: Never Used  Substance Use Topics   Alcohol use: Yes    Alcohol/week: 6.0 standard drinks of alcohol    Types: 6 Glasses of wine per week    Comment: daily   Drug use: No     MEDICATIONS    Home Medication:    Current Medication:  Current Facility-Administered Medications:    0.9 %  sodium chloride  infusion, , Intravenous, Continuous, Von Bellis, MD, Last Rate: 75 mL/hr at 07/27/24 1256, New Bag at 07/27/24 1256   acetaminophen  (TYLENOL ) tablet 650 mg, 650 mg, Oral, TID, Von Bellis, MD, 650 mg at 07/27/24 1555   amLODipine (NORVASC) tablet 10 mg, 10 mg, Oral, Daily, Von Bellis, MD, 10 mg at 07/27/24 1111   arformoterol  (BROVANA ) nebulizer solution 15 mcg, 15 mcg, Nebulization, BID, 15 mcg at 07/27/24 0739 **AND** budesonide  (PULMICORT ) nebulizer solution 0.25 mg, 0.25 mg, Nebulization, BID, Von Bellis, MD, 0.25 mg at  07/27/24 9270   bisacodyl  (DULCOLAX) EC tablet 10 mg, 10 mg, Oral, QHS, Von Bellis, MD   bisacodyl  (DULCOLAX) suppository 10 mg, 10 mg, Rectal, Daily PRN, Von Bellis, MD   cyanocobalamin  (VITAMIN B12) injection 1,000 mcg, 1,000 mcg, Intramuscular, Daily, 1,000 mcg at 07/27/24 0914 **FOLLOWED BY** [START ON 08/01/2024] cyanocobalamin  (VITAMIN B12) tablet 1,000 mcg, 1,000 mcg, Oral, Daily, Von Bellis, MD   [COMPLETED] diazepam  (VALIUM ) tablet 5 mg, 5 mg, Oral, Q6H, 5 mg at 07/27/24 0155 **FOLLOWED BY** diazepam  (VALIUM ) tablet 5 mg, 5 mg, Oral, TID, 5 mg at 07/27/24 1555 **FOLLOWED BY** [START ON 07/28/2024] diazepam  (VALIUM ) tablet 5 mg, 5 mg, Oral, BID **FOLLOWED BY** [START ON 07/29/2024] diazepam  (VALIUM ) tablet 5 mg, 5 mg, Oral, Q1200, Von Bellis, MD   enoxaparin  (LOVENOX ) injection 40 mg, 40 mg, Subcutaneous, Q24H, Von Bellis, MD, 40 mg at 07/27/24 9097   folic acid  (FOLVITE ) tablet 1 mg, 1 mg, Oral, Daily, Von Bellis, MD, 1 mg at 07/27/24 0901   guaiFENesin  (MUCINEX ) 12 hr tablet 600 mg, 600 mg, Oral, BID, Von Bellis, MD, 600 mg at 07/27/24 0901   haloperidol lactate (HALDOL) injection 2 mg, 2 mg, Intravenous, Q6H PRN, Von Bellis, MD, 2 mg at 07/24/24 0906   hydrALAZINE  (APRESOLINE ) injection 10 mg, 10 mg, Intravenous, Q6H PRN, Von Bellis, MD, 10 mg at 07/27/24 0911   HYDROcodone-acetaminophen  (NORCO/VICODIN) 5-325 MG per tablet 1 tablet, 1 tablet, Oral, Q6H PRN, Von Bellis, MD, 1 tablet at 07/27/24 1601   ipratropium-albuterol  (DUONEB) 0.5-2.5 (3) MG/3ML nebulizer solution 3 mL, 3 mL, Nebulization, Once, Ward, Kristen N, DO   ipratropium-albuterol  (DUONEB) 0.5-2.5 (3) MG/3ML nebulizer solution 3 mL, 3 mL, Nebulization, Q4H PRN, Von Bellis, MD, 3 mL at 07/27/24 1601   LORazepam  (ATIVAN ) tablet 0.5 mg, 0.5 mg, Oral, BID PRN, Von Bellis, MD, 0.5 mg at 07/26/24 1600   methylPREDNISolone  sodium succinate (SOLU-MEDROL ) 40 mg/mL injection 40 mg, 40 mg, Intravenous,  BID, 40 mg at 07/27/24 0911 **FOLLOWED BY** [START ON 07/28/2024] predniSONE  (DELTASONE ) tablet 40 mg, 40 mg, Oral, Q breakfast, Von Bellis, MD   multivitamin with minerals tablet 1 tablet, 1 tablet, Oral, Daily, Von Bellis, MD, 1 tablet at 07/27/24 0901   ondansetron  (ZOFRAN ) tablet 4 mg, 4 mg, Oral, Q6H PRN, 4 mg at 07/26/24 0222 **OR** ondansetron  (ZOFRAN ) injection 4 mg, 4 mg, Intravenous, Q6H PRN, Von Bellis, MD, 4 mg at 07/26/24 2032   pantoprazole  (PROTONIX ) EC tablet 40 mg, 40 mg, Oral, BID, Von Bellis, MD, 40 mg at 07/27/24 0901  polyethylene glycol (MIRALAX  / GLYCOLAX ) packet 17 g, 17 g, Oral, BID, Von Bellis, MD, 17 g at 07/27/24 9097   ramipril  (ALTACE ) capsule 10 mg, 10 mg, Oral, Daily, Von Bellis, MD, 10 mg at 07/27/24 9097   sodium chloride  flush (NS) 0.9 % injection 3 mL, 3 mL, Intravenous, Q12H, Von Bellis, MD, 3 mL at 07/27/24 9096   sodium chloride  flush (NS) 0.9 % injection 3 mL, 3 mL, Intravenous, Q12H, Von Bellis, MD, 3 mL at 07/27/24 9096   sodium chloride  flush (NS) 0.9 % injection 3 mL, 3 mL, Intravenous, PRN, Von Bellis, MD, 3 mL at 07/25/24 2138   sodium chloride  tablet 2 g, 2 g, Oral, BID WC, Von Bellis, MD, 2 g at 07/27/24 1555   thiamine  (VITAMIN B1) tablet 100 mg, 100 mg, Oral, Daily, 100 mg at 07/27/24 0903 **OR** thiamine  (VITAMIN B1) injection 100 mg, 100 mg, Intravenous, Daily, Von Bellis, MD, 100 mg at 07/25/24 0831    ALLERGIES   Zithromax  [azithromycin ], Quetiapine , and Buspar  [buspirone ]     REVIEW OF SYSTEMS    Review of Systems:  Gen:  Denies  fever, sweats, chills weigh loss  HEENT: Denies blurred vision, double vision, ear pain, eye pain, hearing loss, nose bleeds, sore throat Cardiac:  No dizziness, chest pain or heaviness, chest tightness,edema Resp:   reports dyspnea chronically  Gi: Denies swallowing difficulty, stomach pain, nausea or vomiting, diarrhea, constipation, bowel incontinence Gu:  Denies  bladder incontinence, burning urine Ext:   Denies Joint pain, stiffness or swelling Skin: Denies  skin rash, easy bruising or bleeding or hives Endoc:  Denies polyuria, polydipsia , polyphagia or weight change Psych:   Denies depression, insomnia or hallucinations   Other:  All other systems negative   VS: BP (!) 180/82 (BP Location: Right Arm)   Pulse 90   Temp 97.6 F (36.4 C)   Resp 17   Ht 5' 5 (1.651 m)   Wt 55 kg   SpO2 97%   BMI 20.18 kg/m      PHYSICAL EXAM    GENERAL:NAD, no fevers, chills, no weakness no fatigue HEAD: Normocephalic, atraumatic.  EYES: Pupils equal, round, reactive to light. Extraocular muscles intact. No scleral icterus.  MOUTH: Moist mucosal membrane. Dentition intact. No abscess noted.  EAR, NOSE, THROAT: Clear without exudates. No external lesions.  NECK: Supple. No thyromegaly. No nodules. No JVD.  PULMONARY: decreased breath sounds with mild rhonchi worse at bases bilaterally.  CARDIOVASCULAR: S1 and S2. Regular rate and rhythm. No murmurs, rubs, or gallops. No edema. Pedal pulses 2+ bilaterally.  GASTROINTESTINAL: Soft, nontender, nondistended. No masses. Positive bowel sounds. No hepatosplenomegaly.  MUSCULOSKELETAL: No swelling, clubbing, or edema. Range of motion full in all extremities.  NEUROLOGIC: Cranial nerves II through XII are intact. No gross focal neurological deficits. Sensation intact. Reflexes intact.  SKIN: No ulceration, lesions, rashes, or cyanosis. Skin warm and dry. Turgor intact.  PSYCHIATRIC: Mood, affect within normal limits. The patient is awake, alert and oriented x 3. Insight, judgment intact.       IMAGING   Study Result  Narrative & Impression  EXAM: CT CHEST, ABDOMEN AND PELVIS WITH CONTRAST 07/24/2024 05:48:42 AM   TECHNIQUE: CT of the chest, abdomen and pelvis was performed with the administration of intravenous contrast. Multiplanar reformatted images are provided for review. Automated exposure  control, iterative reconstruction, and/or weight based adjustment of the mA/kV was utilized to reduce the radiation dose to as low as reasonably achievable.   COMPARISON:  Chest CT 10/16/2023.   CLINICAL HISTORY: 63 year old female. Fall, left rib pain, abd pain, diarrhea, nausea.   FINDINGS:   CHEST:   MEDIASTINUM AND LYMPH NODES: Normal heart size with evidence of calcified coronary artery and aortic atherosclerosis. The central airways are clear. No mediastinal, hilar or axillary lymphadenopathy.   LUNGS AND PLEURA: Chronic emphysema. Large lung volumes. Trace retained secretions in the trachea. Central airways are patent. Occasional peripheral airway opacification due to retained secretion such as in the right lower lobe on series 4 image 111. No consolidation or pleural effusion. Mild generalized bronchial wall thickening. No suspicious pulmonary nodule.   ABDOMEN AND PELVIS:   LIVER: Subcentimeter but circumscribed hypodensity in the left hepatic lobe on series 2 image 56 appears to be a small benign cyst or hemangioma (no follow up imaging recommended). Liver enhancement remains normal.   GALLBLADDER AND BILE DUCTS: Gallbladder is distended (coronal image 28). No pericholecystic inflammation, sludge, or stones evident by CT. No biliary ductal dilatation.   SPLEEN: Diminutive spleen.   PANCREAS: No acute abnormality.   ADRENAL GLANDS: No acute abnormality.   KIDNEYS, URETERS AND BLADDER: No stones in the kidneys or ureters. No hydronephrosis. No perinephric or periureteral stranding. Grossly negative urinary bladder.   GI AND BOWEL: Small gastric hiatal hernia. Motion artifact obscures detail of the distal small bowel in the pelvis and the rectosigmoid colon. Upstream large and small bowel loops are nondilated. Mildly redundant transverse colon. Cecum is on a lax mesentery. Normal gas containing appendix on coronal image 47.   REPRODUCTIVE ORGANS: Pelvis  motion artifact. Obscured detail of the uterus and adnexa.   PERITONEUM AND RETROPERITONEUM: No ascites. No free air.   VASCULATURE: Advanced abdominal aortic calcified atherosclerosis continuing into the iliac arteries. Major arterial structures appear patent. Major portal venous structures appear patent.   ABDOMINAL AND PELVIS LYMPH NODES: No lymphadenopathy.   BONES AND SOFT TISSUES: Non displaced fractures of the anterior left left 5th and 6th ribs are subtle, new from the January CT. No displaced rib fracture. No superficial soft tissue injury. Maintained vertebral height. Lumbar disc and endplate degeneration. Pelvis motion artifact.   IMPRESSION: 1. Subtle nondisplaced fractures of the anterior left 5th and 6th ribs, new from prior chest CT. 2. Motion artifact in the pelvis.  No other acute traumatic injury identified. 3. Distended gallbladder but no CT evidence of acute cholecystitis. 4. Emphysema.  Mild airway thickening and retained secretions. 5. Coronary artery and aortic atherosclerosis.   Electronically signed by: Helayne Hurst MD 07/24/2024 06:16 AM EDT RP Workstation: [id number]     ASSESSMENT/PLAN    Acute hypoxemic respiratory failure -CT chest - no pneumonia or large aspiration   - She does have mild atelectasis and id like to try chest physiotherapy to recruit her bases and we should be able to wean her off oxygen.  She takes shallow breaths due to pain from left rib fractures.   - will order chest PT with incentive spirometry for now  - ddimer to rule out PE -this may be falsely elevated due to recent chest trauma but her contrasted CT chest did not pick up any thrombosis    Alcoholism   Patient reports ongoing alcohilism and recently tried to quit with onset of withdrawal symptoms.  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  -will need CIWA protocol  Anxiety and Depression    - patient reports depression , she states she was unable to have children and has no family or support.  She drinks and smokes to keep herself occupied but admits to worsening depression and alcoholism.  She asked if someone could help her discuss these issues.  -will place [facility] consultation and consider adult mental health evaluation           Thank you for allowing me to participate in the care of this patient.   Patient/Family are satisfied with care plan and all questions have been answered.    Provider disclosure: Patient with at least one acute or chronic illness or injury that poses a threat to life or bodily function and is being managed actively during this encounter.  All of the below services have been performed independently by signing provider:  review of prior documentation from internal and or external health records.  Review of previous and current lab results.  Interview and comprehensive assessment during patient visit today. Review of current and previous chest radiographs/CT scans. Discussion of management and test interpretation with health care team and patient/family.   This document was prepared using Dragon voice recognition software and may include unintentional dictation errors.     Kaimen Peine, M.D.  Division of Pulmonary & Critical Care Medicine

## 2024-07-27 NOTE — Plan of Care (Signed)

## 2024-07-27 NOTE — Progress Notes (Signed)
 [facility] Progress Note  Patient: Christine Sparks    FMW:[id number]  DOA: 07/24/2024     Date of Service: the patient was seen and examined on 07/27/2024  Chief Complaint  Patient presents with   Diarrhea   Brief hospital course: Christine Sparks is Christine Sparks with Past medical history of COPD, HTN, HLD, as reviewed from EMR, presented to [facility] ED with complaining of feeling weak and fell at home and did not left-sided ribs.  Complaining of pain on the left side and also mentioned to diarrhea for past 3 days with nausea, no vomiting.  Patient was not sure of hitting her head, denies any neck pain or back pain.  Patient reported some wheezing and feeling anxious.  No cough or congestion.  EMS did not notice any hypoxia, but placed on oxygen for comfort and anxiety. Patient drinks 5 to 6 glasses of wine every day, denies any drug abuse but she does smoke cigarettes.  Patient is trying to quit but is still she smokes off-and-on.  ED workup: Patient was found to have hyponatremia Left 5th and 6th rib fracture.  Nondisplaced EtOH abuse, placed on CIWA protocol.  [facility] was consulted for admission and further management as below.  Assessment and Plan:    # Hypotonic hyponatremia Presented with generalized weakness and fall Serum osmolality 262, low Na 122 Continue fluid restriction 1.5 to 2/day Salt tablets 2 g twice daily Continue IV fluid for hydration Na 122>>130>128 Check sodium level daily Follow PT and OT eval   # COPD exacerbation S/p Solu-Medrol  125 mg given in the ED S/p Breztri  inhaler Started Mucinex  600 mg p.o. twice daily DuoNeb every 4 hourly 10/23 worsening of COPD, started Brovana  and Pulmicort  nebulizer twice daily Solu-Medrol  125 mg IV x 1 dose given, started Solu-Medrol  40 mg IV twice daily followed by tapering dose prednisone  CXR: Negative for any infiltrate Follow RVP panel pending   # Left 5th and 6th rib fracture, nondisplaced. S/p fall at home  secondary to dehydration due to diarrhea Continue as needed meds for pain control   # Cervical stenosis Continue as needed medication for pain control Recommend to follow with PCP and spine surgery as an outpatient if she experience any problems in the future.     # Diarrhea, unknown cause. Resolved  Last Diarrheal disorder was on 10/21 before admission No more diarrhea during hospital stay. S/p IVF, continue oral hydration S/p probiotics, Dc'd Use PPI for GI prophylaxis 10/25 patient is requesting for stool softeners.  Started laxatives    # EtOH use disorder: Patient is counseling done Patient drinks 5 to 6 glasses of wine daily Continue CIWA protocol and monitor for withdrawals Started multivitamin, folate and thiamine  10/24 started Valium  tapering dose for detox as patient is continuously having withdrawal symptoms.    # Nicotine  dependence, patient smokes cigarettes off-and-on to quit. Use nicotine  patch.  Smoking cessation counseling done.   # Hypertension Resume the lisinopril  10 mg p.o. daily home dose 10/25 started amlodipine 10 mg p.o. daily Use hydralazine  as needed Monitor BP and titrate medication accordingly   # Depression and anxiety Resumed Ativan  as needed home dose Patient used to take Celexa  but not taking anymore.   Vitamin B12 level 221, goal >400.   Started vitamin B12 1000 mcg IM injection daily during hospital stay, followed by oral supplement.  Follow-up PCP to repeat vitamin B12 level after 3 to 6 months.   Body mass index is 20.18 kg/m.  Interventions:   Diet: Cardiac diet DVT Prophylaxis: Subcutaneous Lovenox    Advance goals of care discussion: Full code  Family Communication: family was not present at bedside, at the time of interview.  The pt provided permission to discuss medical plan with the family. Opportunity was given to ask question and all questions were answered satisfactorily.   Disposition:  Pt is from Home, admitted with  hyponatremia, falls, left 5th and 6th fracture, EtOH abuse/withdrawal symptoms, still has withdrawal symptoms and intractable pain and low sodium, which precludes Christine safe discharge. Discharge to home, when stable, may need Christine few days to improve.  Subjective: No acute events overnight, patient feels improvement in the withdrawal symptoms after starting Ativan .  He still has shortness of breath and mild productive cough.  Denied any other complaints, pain is under control.   Physical Exam: General: NAD, lying comfortably Appear in no distress, affect appropriate Eyes: PERRLA ENT: Oral Mucosa Clear, moist  Neck: no JVD,  Cardiovascular: S1 and S2 Present, no Murmur,  Respiratory: Increased respiratory effort, mild crackles and  wheezes bilaterally, gradually improving Abdomen: Bowel Sound present, Soft and no tenderness,  Skin: no rashes Extremities: no Pedal edema, no calf tenderness Neurologic: without any new focal findings Gait not checked due to patient safety concerns  Vitals:   07/27/24 0911 07/27/24 1050 07/27/24 1530 07/27/24 1534  BP: (!) 175/98 (!) 150/84 (!) 142/76 (!) 180/82  Pulse:  93 98 90  Resp:  17 17 17   Temp:  97.6 F (36.4 C) 98.1 F (36.7 C) 97.6 F (36.4 C)  TempSrc:      SpO2:  99% 92% 97%  Weight:      Height:       No intake or output data in the 24 hours ending 07/27/24 1543  Filed Weights   07/24/24 0416  Weight: 55 kg    Data Reviewed: I have personally reviewed and interpreted daily labs, tele strips, imagings as discussed above. I reviewed all nursing notes, pharmacy notes, vitals, pertinent old records I have discussed plan of care as described above with RN and patient/family.  CBC: Recent Labs  Lab 07/24/24 0417 07/25/24 0408 07/26/24 0429 07/27/24 0326  WBC 10.4 11.1* 10.5 10.2  NEUTROABS 8.3*  --   --   --   HGB 13.8 10.5* 10.1* 10.7*  HCT 38.3 29.4* 29.9* 31.7*  MCV 88.7 91.9 95.5 95.2  PLT 447* 335 314 338   Basic Metabolic  Panel: Recent Labs  Lab 07/24/24 0417 07/24/24 1105 07/25/24 0408 07/26/24 0429 07/27/24 0326  NA 122*  --  128* 130* 128*  K 4.1  --  4.7 4.3 4.2  CL 86*  --  97* 99 96*  CO2 26  --  24 23 23   GLUCOSE 102*  --  101* 113* 102*  BUN <5*  --  9 12 11   CREATININE 0.59  --  0.57 0.62 0.64  CALCIUM  8.6*  --  8.7* 8.7* 8.9  MG 1.8  --  1.9 2.0 2.1  PHOS  --  4.1 4.0 4.0 3.9    Studies: No results found.   Scheduled Meds:  acetaminophen   650 mg Oral TID   amLODipine  10 mg Oral Daily   arformoterol   15 mcg Nebulization BID   And   budesonide  (PULMICORT ) nebulizer solution  0.25 mg Nebulization BID   bisacodyl   10 mg Oral QHS   cyanocobalamin   1,000 mcg Intramuscular Daily   Followed by   NOREEN ON 08/01/2024]  vitamin B-12  1,000 mcg Oral Daily   diazepam   5 mg Oral TID   Followed by   NOREEN ON 07/28/2024] diazepam   5 mg Oral BID   Followed by   NOREEN ON 07/29/2024] diazepam   5 mg Oral Q1200   enoxaparin  (LOVENOX ) injection  40 mg Subcutaneous Q24H   folic acid   1 mg Oral Daily   guaiFENesin   600 mg Oral BID   ipratropium-albuterol   3 mL Nebulization Once   methylPREDNISolone  (SOLU-MEDROL ) injection  40 mg Intravenous BID   Followed by   NOREEN ON 07/28/2024] predniSONE   40 mg Oral Q breakfast   multivitamin with minerals  1 tablet Oral Daily   pantoprazole   40 mg Oral BID   polyethylene glycol  17 g Oral BID   ramipril   10 mg Oral Daily   saccharomyces boulardii  250 mg Oral BID   sodium chloride  flush  3 mL Intravenous Q12H   sodium chloride  flush  3 mL Intravenous Q12H   sodium chloride   2 g Oral BID WC   thiamine   100 mg Oral Daily   Or   thiamine   100 mg Intravenous Daily   Continuous Infusions:  sodium chloride  75 mL/hr at 07/27/24 1256   PRN Meds: bisacodyl , haloperidol lactate, hydrALAZINE , HYDROcodone-acetaminophen , ipratropium-albuterol , LORazepam , ondansetron  **OR** ondansetron  (ZOFRAN ) IV, sodium chloride  flush  Time spent: 55  minutes  Author: ELVAN SOR. MD [facility] 07/27/2024 3:43 PM  To reach On-call, see care teams to locate the attending and reach out to them via [email or web address]. If 7PM-7AM, please contact night-coverage If you still have difficulty reaching the attending provider, please page the [facility] (Director on Call) for [facility] on amion for assistance.

## 2024-07-27 NOTE — Progress Notes (Signed)
 Physical Therapy Treatment Patient Details Name: Christine Sparks MRN: [id number] DOB: [birth year] Today's Date: 07/27/2024   History of Present Illness Pt is a 63 y/o F presenting to ED with c/o weakness, diarrhea, fall at home. CT head and neck negative, CT chest/abdomen found subtle nondisplaced fractures of anterior L 5th and 6th ribs. MD assessment includes hypotonic hyponatremia, COPD exacerbation, cervical stenosis, diarrhea of unknown cause, ETOH use disorder. PMH significant for COPD, HTN, HLD.    PT Comments  Pt alert, pleasant and agreeable to participate in PT tx. Pt appeared to have less work of breathing and was less anxious overall this date. Pt was received in bed, minA for trunk elevation for supine > sit transfer. Pt performed STS from EOB with CGA, able to tolerate short bout of ambulation in room with no LOB. Pt required one standing rest break during bout of ambulation due to SOB. Pt initially on 2L [place] at start of session, titrated to 3L with mobility due to desat to 87% with supine > sit transfer, SpO2 remained >90% on 3L- RN informed and aware. Pt was receptive to all education on gradually increasing tolerance for ambulation and was encouraged to continue progressing mobility with nursing staff. Pt was left semi-supine in bed at end of session, all needs in reach. Pt continues to demonstrate deficits in activity tolerance and overall functional mobility, would benefit from skilled PT intervention to continue progressing toward goals.    If plan is discharge home, recommend the following: A little help with walking and/or transfers;A little help with bathing/dressing/bathroom;Assistance with cooking/housework;Assist for transportation;Help with stairs or ramp for entrance   Can travel by private vehicle     Yes  Equipment Recommendations  Other (comment) (TBD)    Recommendations for Other Services       Precautions / Restrictions Precautions Precautions: Fall Recall of  Precautions/Restrictions: Impaired Restrictions Weight Bearing Restrictions Per Provider Order: No     Mobility  Bed Mobility Overal bed mobility: Needs Assistance Bed Mobility: Supine to Sit, Sit to Supine     Supine to sit: Min assist, HOB elevated Sit to supine: Supervision   General bed mobility comments: minA trunk elevation for supine > sit, effortful to complete transfers    Transfers Overall transfer level: Needs assistance Equipment used: Rolling walker (2 wheels) Transfers: Sit to/from Stand Sit to Stand: Contact guard assist           General transfer comment: STS from EOB with CGA, VC for hand position on RW    Ambulation/Gait Ambulation/Gait assistance: Contact guard assist Gait Distance (Feet): 20 Feet Assistive device: Rolling walker (2 wheels) Gait Pattern/deviations: Step-through pattern, Decreased stride length, Narrow base of support Gait velocity: decreased     General Gait Details: Pt amb short distance in room, no LOB, required one standing rest break due to fatigue. VC throughout for RW positioning to maintain BOS within RW frame.   Stairs             Wheelchair Mobility     Tilt Bed    Modified Rankin (Stroke Patients Only)       Balance Overall balance assessment: Needs assistance Sitting-balance support: Feet supported Sitting balance-Leahy Scale: Good Sitting balance - Comments: less tremulous this date, steady static and dynamic sitting   Standing balance support: Bilateral upper extremity supported, Reliant on assistive device for balance Standing balance-Leahy Scale: Fair Standing balance comment: heavy BUE support on RW  Communication Communication Communication: No apparent difficulties  Cognition Arousal: Alert Behavior During Therapy: WFL for tasks assessed/performed   PT - Cognitive impairments: No apparent impairments                       PT - Cognition  Comments: A&Ox4 Following commands: Impaired Following commands impaired: Follows one step commands with increased time    Cueing Cueing Techniques: Verbal cues, Visual cues  Exercises Other Exercises Other Exercises: HR and SpO2 monitored during session: Pt initially on 2L [place] at rest HR 92bpm SpO2 98% at rest. After transfer to sitting EOB HR 103bpm SpO2 87%. Titrated to 3L for mobility with RN permission SpO2 improved to 94%. After bout of ambulation HR in low 110s SpO2 90%. Pt was left on 3L with HR 96 SpO2 90%- RN informed    General Comments        Pertinent Vitals/Pain Pain Assessment Pain Assessment: 0-10 Pain Score: 7  Pain Location: L side flank/rib pain, after ambulation Pain Descriptors / Indicators: Aching, Grimacing, Sharp Pain Intervention(s): Limited activity within patient's tolerance, Monitored during session, Repositioned, Patient requesting pain meds-RN notified    Home Living                          Prior Function            PT Goals (current goals can now be found in the care plan section) Progress towards PT goals: Progressing toward goals    Frequency    Min 2X/week      PT Plan      Co-evaluation              AM-PAC PT 6 Clicks Mobility   Outcome Measure  Help needed turning from your back to your side while in a flat bed without using bedrails?: A Little Help needed moving from lying on your back to sitting on the side of a flat bed without using bedrails?: A Little Help needed moving to and from a bed to a chair (including a wheelchair)?: A Little Help needed standing up from a chair using your arms (e.g., wheelchair or bedside chair)?: A Little Help needed to walk in hospital room?: A Little Help needed climbing 3-5 steps with a railing? : A Lot 6 Click Score: 17    End of Session Equipment Utilized During Treatment: Gait belt;Oxygen Activity Tolerance: Patient limited by fatigue Patient left: in bed;with call  bell/phone within reach;with bed alarm set Nurse Communication: Mobility status;Patient requests pain meds;Other (comment) (Pt left on 3L supplemental O2) PT Visit Diagnosis: Other abnormalities of gait and mobility (R26.89);Muscle weakness (generalized) (M62.81);History of falling (Z91.81);Difficulty in walking, not elsewhere classified (R26.2);Pain Pain - Right/Left: Left Pain - part of body:  (ribcage)     Time: 8471-8441 PT Time Calculation (min) (ACUTE ONLY): 30 min  Charges:    $Gait Training: 8-22 mins $Therapeutic Activity: 8-22 mins PT General Charges $$ ACUTE PT VISIT: 1 Visit                     Janell Axe, SPT

## 2024-07-28 DIAGNOSIS — E871 Hypo-osmolality and hyponatremia: Secondary | ICD-10-CM | POA: Diagnosis not present

## 2024-07-28 LAB — CBC
HCT: 30.9 % — ABNORMAL LOW (ref 36.0–46.0)
Hemoglobin: 10.9 g/dL — ABNORMAL LOW (ref 12.0–15.0)
MCH: 33 pg (ref 26.0–34.0)
MCHC: 35.3 g/dL (ref 30.0–36.0)
MCV: 93.6 fL (ref 80.0–100.0)
Platelets: 320 K/uL (ref 150–400)
RBC: 3.3 MIL/uL — ABNORMAL LOW (ref 3.87–5.11)
RDW: 12.5 % (ref 11.5–15.5)
WBC: 10 K/uL (ref 4.0–10.5)
nRBC: 0 % (ref 0.0–0.2)

## 2024-07-28 LAB — BASIC METABOLIC PANEL WITH GFR
Anion gap: 8 (ref 5–15)
BUN: 11 mg/dL (ref 8–23)
CO2: 24 mmol/L (ref 22–32)
Calcium: 8.5 mg/dL — ABNORMAL LOW (ref 8.9–10.3)
Chloride: 98 mmol/L (ref 98–111)
Creatinine, Ser: 0.39 mg/dL — ABNORMAL LOW (ref 0.44–1.00)
GFR, Estimated: >60 mL/min (ref >60)
Glucose, Bld: 119 mg/dL — ABNORMAL HIGH (ref 70–99)
Potassium: 3.8 mmol/L (ref 3.5–5.1)
Sodium: 130 mmol/L — ABNORMAL LOW (ref 135–145)

## 2024-07-28 MED ORDER — MAGNESIUM HYDROXIDE 400 MG/5ML PO SUSP
30.0000 mL | Freq: Every day | ORAL | Status: DC | PRN
  Administered 2024-07-28: 30 mL via ORAL
  Filled 2024-07-28: qty 30

## 2024-07-28 MED ORDER — FUROSEMIDE 10 MG/ML IJ SOLN
40.0000 mg | Freq: Every day | INTRAMUSCULAR | Status: AC
  Administered 2024-07-28 – 2024-07-29 (×2): 40 mg via INTRAVENOUS
  Filled 2024-07-28 (×2): qty 4

## 2024-07-28 MED ORDER — TRAZODONE HCL 50 MG PO TABS
50.0000 mg | ORAL_TABLET | Freq: Every day | ORAL | Status: AC
  Administered 2024-07-28: 50 mg via ORAL
  Filled 2024-07-28: qty 1

## 2024-07-28 NOTE — Progress Notes (Signed)
 [facility] Progress Note  Patient: Christine Sparks    FMW:[id number]  DOA: 07/24/2024     Date of Service: the patient was seen and examined on 07/28/2024  Chief Complaint  Patient presents with   Diarrhea   Brief hospital course: Christine Sparks is a 63 y.o. female with Past medical history of COPD, HTN, HLD, as reviewed from EMR, presented to [facility] ED with complaining of feeling weak and fell at home and did not left-sided ribs.  Complaining of pain on the left side and also mentioned to diarrhea for past 3 days with nausea, no vomiting.  Patient was not sure of hitting her head, denies any neck pain or back pain.  Patient reported some wheezing and feeling anxious.  No cough or congestion.  EMS did not notice any hypoxia, but placed on oxygen for comfort and anxiety. Patient drinks 5 to 6 glasses of wine every day, denies any drug abuse but she does smoke cigarettes.  Patient is trying to quit but is still she smokes off-and-on.  ED workup: Patient was found to have hyponatremia Left 5th and 6th rib fracture.  Nondisplaced EtOH abuse, placed on CIWA protocol.  TRH was consulted for admission and further management as below.  Assessment and Plan:    # Hypotonic hyponatremia Presented with generalized weakness and fall Serum osmolality 262, low Na 122 Continue fluid restriction 1.5 to 2/day Salt tablets 2 g twice daily Continue IV fluid for hydration Na 122>>130>128>130 Check sodium level daily Follow PT and OT eval   # COPD exacerbation S/p Solu-Medrol  125 mg given in the ED S/p Breztri  inhaler Started Mucinex  600 mg p.o. twice daily DuoNeb every 4 hourly 10/23 worsening of COPD, started Brovana  and Pulmicort  nebulizer twice daily Solu-Medrol  125 mg IV x 1 dose given, started Solu-Medrol  40 mg IV twice daily followed by tapering dose prednisone  CXR: Negative for any infiltrate RVP panel negative Pulmonary consulted, Lasix 40 mg IV daily x 2 doses given by pulm   #  Left 5th and 6th rib fracture, nondisplaced. S/p fall at home secondary to dehydration due to diarrhea Continue as needed meds for pain control   # Cervical stenosis Continue as needed medication for pain control Recommend to follow with PCP and spine surgery as an outpatient if she experience any problems in the future.     # Diarrhea, unknown cause. Resolved  Last Diarrheal disorder was on 10/21 before admission No more diarrhea during hospital stay. S/p IVF, continue oral hydration S/p probiotics, Dc'd Use PPI for GI prophylaxis 10/25 patient is requesting for stool softeners.  Started laxatives    # EtOH use disorder: Patient is counseling done Patient drinks 5 to 6 glasses of wine daily Continue CIWA protocol and monitor for withdrawals Started multivitamin, folate and thiamine  10/24 started Valium  tapering dose for detox as patient is continuously having withdrawal symptoms.    # Nicotine  dependence, patient smokes cigarettes off-and-on to quit. Use nicotine  patch.  Smoking cessation counseling done.   # Hypertension Resume the lisinopril  10 mg p.o. daily home dose 10/25 started amlodipine 10 mg p.o. daily Use hydralazine  as needed Monitor BP and titrate medication accordingly   # Depression and anxiety Resumed Ativan  as needed home dose Patient used to take Celexa  but not taking anymore.   Vitamin B12 level 221, goal >400.   Started vitamin B12 1000 mcg IM injection daily during hospital stay, followed by oral supplement.  Follow-up PCP to repeat vitamin B12 level after  3 to 6 months.   Body mass index is 20.18 kg/m.  Interventions:   Diet: Cardiac diet DVT Prophylaxis: Subcutaneous Lovenox    Advance goals of care discussion: Full code  Family Communication: family was not present at bedside, at the time of interview.  The pt provided permission to discuss medical plan with the family. Opportunity was given to ask question and all questions were answered  satisfactorily.   Disposition:  Pt is from Home, admitted with hyponatremia, falls, left 5th and 6th fracture, EtOH abuse/withdrawal symptoms, still has withdrawal symptoms and intractable pain and low sodium, which precludes a safe discharge. Discharge to home, when stable, may need a few days to improve.  Subjective: No acute events overnight, patient denies any withdrawal symptoms, breathing is getting better, has mild productive cough.  Left-sided chest wall pain is well-controlled.  Patient stated that she could not sleep last night, does not take anything at home because she normally drinks and sleep well at home.     Physical Exam: General: NAD, lying comfortably Appear in no distress, affect appropriate Eyes: PERRLA ENT: Oral Mucosa Clear, moist  Neck: no JVD,  Cardiovascular: S1 and S2 Present, no Murmur,  Respiratory: Equal air entry bilaterally, no crackles, minimal wheezes, significant improvement noticed.  Abdomen: Bowel Sound present, Soft and no tenderness,  Skin: no rashes Extremities: no Pedal edema, no calf tenderness Neurologic: without any new focal findings Gait not checked due to patient safety concerns  Vitals:   07/27/24 1959 07/28/24 0443 07/28/24 0735 07/28/24 0824  BP:  (!) 171/77 (!) 168/80   Pulse:  87 84   Resp:  18 18   Temp:  98.2 F (36.8 C) 97.6 F (36.4 C)   TempSrc:  Oral Oral   SpO2: 97% 94% 98% 92%  Weight:      Height:        Intake/Output Summary (Last 24 hours) at 07/28/2024 1417 Last data filed at 07/28/2024 0300 Gross per 24 hour  Intake 5080 ml  Output --  Net 5080 ml    Filed Weights   07/24/24 0416  Weight: 55 kg    Data Reviewed: I have personally reviewed and interpreted daily labs, tele strips, imagings as discussed above. I reviewed all nursing notes, pharmacy notes, vitals, pertinent old records I have discussed plan of care as described above with RN and patient/family.  CBC: Recent Labs  Lab 07/24/24 0417  07/25/24 0408 07/26/24 0429 07/27/24 0326 07/28/24 0428  WBC 10.4 11.1* 10.5 10.2 10.0  NEUTROABS 8.3*  --   --   --   --   HGB 13.8 10.5* 10.1* 10.7* 10.9*  HCT 38.3 29.4* 29.9* 31.7* 30.9*  MCV 88.7 91.9 95.5 95.2 93.6  PLT 447* 335 314 338 320   Basic Metabolic Panel: Recent Labs  Lab 07/24/24 0417 07/24/24 1105 07/25/24 0408 07/26/24 0429 07/27/24 0326 07/28/24 0428  NA 122*  --  128* 130* 128* 130*  K 4.1  --  4.7 4.3 4.2 3.8  CL 86*  --  97* 99 96* 98  CO2 26  --  24 23 23 24   GLUCOSE 102*  --  101* 113* 102* 119*  BUN <5*  --  9 12 11 11   CREATININE 0.59  --  0.57 0.62 0.64 0.39*  CALCIUM  8.6*  --  8.7* 8.7* 8.9 8.5*  MG 1.8  --  1.9 2.0 2.1  --   PHOS  --  4.1 4.0 4.0 3.9  --  Studies: No results found.   Scheduled Meds:  acetaminophen   650 mg Oral TID   amLODipine  10 mg Oral Daily   arformoterol   15 mcg Nebulization BID   And   budesonide  (PULMICORT ) nebulizer solution  0.25 mg Nebulization BID   bisacodyl   10 mg Oral QHS   citalopram   40 mg Oral Daily   cyanocobalamin   1,000 mcg Intramuscular Daily   Followed by   NOREEN ON 08/01/2024] vitamin B-12  1,000 mcg Oral Daily   diazepam   5 mg Oral BID   Followed by   NOREEN ON 07/29/2024] diazepam   5 mg Oral Q1200   enoxaparin  (LOVENOX ) injection  40 mg Subcutaneous Q24H   folic acid   1 mg Oral Daily   furosemide  40 mg Intravenous Daily   guaiFENesin   600 mg Oral BID   ipratropium-albuterol   3 mL Nebulization Once   multivitamin with minerals  1 tablet Oral Daily   pantoprazole   40 mg Oral BID   polyethylene glycol  17 g Oral BID   predniSONE   40 mg Oral Q breakfast   ramipril   10 mg Oral Daily   sodium chloride  flush  3 mL Intravenous Q12H   sodium chloride  flush  3 mL Intravenous Q12H   sodium chloride   2 g Oral BID WC   thiamine   100 mg Oral Daily   Or   thiamine   100 mg Intravenous Daily   Continuous Infusions:   PRN Meds: bisacodyl , haloperidol lactate, hydrALAZINE ,  HYDROcodone-acetaminophen , ipratropium-albuterol , LORazepam , ondansetron  **OR** ondansetron  (ZOFRAN ) IV, sodium chloride  flush  Time spent: 55 minutes  Author: ELVAN SOR. MD [facility] 07/28/2024 2:17 PM  To reach On-call, see care teams to locate the attending and reach out to them via [email or web address]. If 7PM-7AM, please contact night-coverage If you still have difficulty reaching the attending provider, please page the [facility] (Director on Call) for [facility] on amion for assistance.

## 2024-07-28 NOTE — Plan of Care (Signed)

## 2024-07-28 NOTE — Plan of Care (Signed)

## 2024-07-28 NOTE — Progress Notes (Signed)
 PULMONOLOGY         Date: 07/28/2024,   MRN# [id number] Christine Sparks 03/21/1961     AdmissionWeight: 55 kg                 CurrentWeight: 55 kg  Referring provider: Dr Von   CHIEF COMPLAINT:   Chest pain and wheezing   HISTORY OF PRESENT ILLNESS     63 y.o. female with PMH of alcoholism, smoking COPD HTN, HLD who reports having diarreah for few days and fell with resultant trauma to thorax and had left rib fractures.  She was sent home on oxygen in Dec 2024 and was weaned off oxygen after few week.  She reports having withrdawal symptoms from alcoholism this week.   She denies ever having seizures. She is currently on 3L/min and reports chest discomfort but is oriented x 3 and is non labored with breathing while giving interview. Imaging showed Trace retained secretions in the trachea. Central airways are patent. Occasional peripheral airway opacification due to retained secretion such as in the right lower lobe.  Patient admits to choking on food and aspiration sometimes but seldom. pCCM consultation for further evaluation and management regarding hypemia, chest discomfort and possible PE.   07/28/24- patient seen at bedside.  Reports breathing is better but her O2 has not been weaned down.  She is able to take deep breath up to with incentive spirometer.  She should be getting close to baseline with resp status.  Today I hear mild pan inspiratory crepitations on ausclutation likely due to interstitial edema and have ordered lasix 40 iv daily.  Her renal function is normal.  She is being treated with benzos and antipsychotics and feels out of it but admits her withdrawal symptoms are resolved.   PAST MEDICAL HISTORY   Past Medical History:  Diagnosis Date   Acute respiratory failure with hypoxia (HCC) 09/19/2023   Anemia    Anxiety    Arthritis    Bronchitis, chronic obstructive (HCC)    COPD exacerbation (HCC) 06/20/2023   Depression    Diabetes  mellitus 10/2010   a1c 5.7, fasting glu 155   Dyspnea    with exertion   H/O bronchitis    Heart murmur    High cholesterol    Hypertension    Menopausal syndrome    NSTEMI (non-ST elevated myocardial infarction) (HCC) 09/19/2023   Tobacco abuse      SURGICAL HISTORY   Past Surgical History:  Procedure Laterality Date   BREAST BIOPSY Left 07/26/2016   complex sclerosing lesion. Coil shape marker   BREAST LUMPECTOMY Left 08/17/2016   complex sclerosing lesion with focal ADH. Clear margins. The coil shaped biopsy marker remains    BREAST LUMPECTOMY WITH NEEDLE LOCALIZATION Left 08/17/2016   Procedure: BREAST LUMPECTOMY WITH NEEDLE LOCALIZATION;  Surgeon: Louanne KANDICE Muse, MD;  Location: [facility] ORS;  Service: General;  Laterality: Left;   CERVICAL CONE BIOPSY     DILATION AND CURETTAGE OF UTERUS     EXCISION / BIOPSY BREAST / NIPPLE / DUCT Left 08/17/2016   complex sclerosing lesion removed   LIPOMA EXCISION Right    benign tumor right leg   LYMPH NODE BIOPSY     TONSILLECTOMY       FAMILY HISTORY   Family History  Problem Relation Age of Onset   Cancer Mother        melanoma   Cancer Father  Lung Cancer   Early death Brother    Heart disease Brother    Breast cancer Paternal Aunt 23   Colon cancer Other    Ovarian cancer Neg Hx    Diabetes Neg Hx      SOCIAL HISTORY   Social History   Tobacco Use   Smoking status: Every Day    Average packs/day: 0.5 packs/day for 45.1 years (22.6 ttl pk-yrs)    Types: Cigarettes    Start date: 10/03/1978    Passive exposure: Past   Smokeless tobacco: Never  Vaping Use   Vaping status: Never Used  Substance Use Topics   Alcohol use: Yes    Alcohol/week: 6.0 standard drinks of alcohol    Types: 6 Glasses of wine per week    Comment: daily   Drug use: No     MEDICATIONS       Current Medication:  Current Facility-Administered Medications:    acetaminophen  (TYLENOL ) tablet 650 mg, 650 mg, Oral, TID,  Von Bellis, MD, 650 mg at 07/28/24 0804   amLODipine (NORVASC) tablet 10 mg, 10 mg, Oral, Daily, Von Bellis, MD, 10 mg at 07/28/24 9195   arformoterol  (BROVANA ) nebulizer solution 15 mcg, 15 mcg, Nebulization, BID, 15 mcg at 07/28/24 0824 **AND** budesonide  (PULMICORT ) nebulizer solution 0.25 mg, 0.25 mg, Nebulization, BID, Von Bellis, MD, 0.25 mg at 07/28/24 9175   bisacodyl  (DULCOLAX) EC tablet 10 mg, 10 mg, Oral, QHS, Von Bellis, MD, 10 mg at 07/27/24 2220   bisacodyl  (DULCOLAX) suppository 10 mg, 10 mg, Rectal, Daily PRN, Von Bellis, MD, 10 mg at 07/28/24 9240   citalopram  (CELEXA ) tablet 40 mg, 40 mg, Oral, Daily, Cleatus Delayne GAILS, MD, 40 mg at 07/27/24 2216   cyanocobalamin  (VITAMIN B12) injection 1,000 mcg, 1,000 mcg, Intramuscular, Daily, 1,000 mcg at 07/28/24 0807 **FOLLOWED BY** [START ON 08/01/2024] cyanocobalamin  (VITAMIN B12) tablet 1,000 mcg, 1,000 mcg, Oral, Daily, Von Bellis, MD   [COMPLETED] diazepam  (VALIUM ) tablet 5 mg, 5 mg, Oral, Q6H, 5 mg at 07/27/24 0155 **FOLLOWED BY** [COMPLETED] diazepam  (VALIUM ) tablet 5 mg, 5 mg, Oral, TID, 5 mg at 07/27/24 2218 **FOLLOWED BY** diazepam  (VALIUM ) tablet 5 mg, 5 mg, Oral, BID, 5 mg at 07/28/24 0804 **FOLLOWED BY** [START ON 07/29/2024] diazepam  (VALIUM ) tablet 5 mg, 5 mg, Oral, Q1200, Von Bellis, MD   enoxaparin  (LOVENOX ) injection 40 mg, 40 mg, Subcutaneous, Q24H, Von Bellis, MD, 40 mg at 07/28/24 9193   folic acid  (FOLVITE ) tablet 1 mg, 1 mg, Oral, Daily, Von Bellis, MD, 1 mg at 07/28/24 9195   guaiFENesin  (MUCINEX ) 12 hr tablet 600 mg, 600 mg, Oral, BID, Von Bellis, MD, 600 mg at 07/28/24 0804   haloperidol lactate (HALDOL) injection 2 mg, 2 mg, Intravenous, Q6H PRN, Von Bellis, MD, 2 mg at 07/24/24 0906   hydrALAZINE  (APRESOLINE ) injection 10 mg, 10 mg, Intravenous, Q6H PRN, Von Bellis, MD, 10 mg at 07/27/24 0911   HYDROcodone-acetaminophen  (NORCO/VICODIN) 5-325 MG per tablet 1 tablet, 1 tablet, Oral, Q6H  PRN, Von Bellis, MD, 1 tablet at 07/28/24 0803   ipratropium-albuterol  (DUONEB) 0.5-2.5 (3) MG/3ML nebulizer solution 3 mL, 3 mL, Nebulization, Once, Ward, Kristen N, DO   ipratropium-albuterol  (DUONEB) 0.5-2.5 (3) MG/3ML nebulizer solution 3 mL, 3 mL, Nebulization, Q4H PRN, Von Bellis, MD, 3 mL at 07/27/24 1601   LORazepam  (ATIVAN ) tablet 0.5 mg, 0.5 mg, Oral, BID PRN, Von Bellis, MD, 0.5 mg at 07/26/24 1600   multivitamin with minerals tablet 1 tablet, 1 tablet, Oral, Daily, Von Bellis, MD,  1 tablet at 07/28/24 9196   ondansetron  (ZOFRAN ) tablet 4 mg, 4 mg, Oral, Q6H PRN, 4 mg at 07/26/24 0222 **OR** ondansetron  (ZOFRAN ) injection 4 mg, 4 mg, Intravenous, Q6H PRN, Von Bellis, MD, 4 mg at 07/26/24 2032   pantoprazole  (PROTONIX ) EC tablet 40 mg, 40 mg, Oral, BID, Von Bellis, MD, 40 mg at 07/28/24 0804   polyethylene glycol (MIRALAX  / GLYCOLAX ) packet 17 g, 17 g, Oral, BID, Von Bellis, MD, 17 g at 07/28/24 0805   [COMPLETED] methylPREDNISolone  sodium succinate (SOLU-MEDROL ) 40 mg/mL injection 40 mg, 40 mg, Intravenous, BID, 40 mg at 07/27/24 2220 **FOLLOWED BY** predniSONE  (DELTASONE ) tablet 40 mg, 40 mg, Oral, Q breakfast, Von Bellis, MD, 40 mg at 07/28/24 0803   ramipril  (ALTACE ) capsule 10 mg, 10 mg, Oral, Daily, Von Bellis, MD, 10 mg at 07/28/24 9193   sodium chloride  flush (NS) 0.9 % injection 3 mL, 3 mL, Intravenous, Q12H, Von Bellis, MD, 3 mL at 07/28/24 0807   sodium chloride  flush (NS) 0.9 % injection 3 mL, 3 mL, Intravenous, Q12H, Von Bellis, MD, 3 mL at 07/28/24 0807   sodium chloride  flush (NS) 0.9 % injection 3 mL, 3 mL, Intravenous, PRN, Von Bellis, MD, 3 mL at 07/25/24 2138   sodium chloride  tablet 2 g, 2 g, Oral, BID WC, Von Bellis, MD, 2 g at 07/28/24 0803   thiamine  (VITAMIN B1) tablet 100 mg, 100 mg, Oral, Daily, 100 mg at 07/28/24 0804 **OR** thiamine  (VITAMIN B1) injection 100 mg, 100 mg, Intravenous, Daily, Von Bellis, MD, 100 mg at  07/25/24 0831    ALLERGIES   Zithromax  [azithromycin ], Quetiapine , and Buspar  [buspirone ]     REVIEW OF SYSTEMS    Review of Systems:  Gen:  Denies  fever, sweats, chills weigh loss  HEENT: Denies blurred vision, double vision, ear pain, eye pain, hearing loss, nose bleeds, sore throat Cardiac:  No dizziness, chest pain or heaviness, chest tightness,edema Resp:   reports dyspnea chronically  Gi: Denies swallowing difficulty, stomach pain, nausea or vomiting, diarrhea, constipation, bowel incontinence Gu:  Denies bladder incontinence, burning urine Ext:   Denies Joint pain, stiffness or swelling Skin: Denies  skin rash, easy bruising or bleeding or hives Endoc:  Denies polyuria, polydipsia , polyphagia or weight change Psych:   Denies depression, insomnia or hallucinations   Other:  All other systems negative   VS: BP (!) 168/80 (BP Location: Left Arm)   Pulse 84   Temp 97.6 F (36.4 C) (Oral)   Resp 18   Ht 5' 5 (1.651 m)   Wt 55 kg   SpO2 92%   BMI 20.18 kg/m      PHYSICAL EXAM    GENERAL:NAD, no fevers, chills, no weakness no fatigue HEAD: Normocephalic, atraumatic.  EYES: Pupils equal, round, reactive to light. Extraocular muscles intact. No scleral icterus.  MOUTH: Moist mucosal membrane. Dentition intact. No abscess noted.  EAR, NOSE, THROAT: Clear without exudates. No external lesions.  NECK: Supple. No thyromegaly. No nodules. No JVD.  PULMONARY: decreased breath sounds with mild rhonchi worse at bases bilaterally.  CARDIOVASCULAR: S1 and S2. Regular rate and rhythm. No murmurs, rubs, or gallops. No edema. Pedal pulses 2+ bilaterally.  GASTROINTESTINAL: Soft, nontender, nondistended. No masses. Positive bowel sounds. No hepatosplenomegaly.  MUSCULOSKELETAL: No swelling, clubbing, or edema. Range of motion full in all extremities.  NEUROLOGIC: Cranial nerves II through XII are intact. No gross focal neurological deficits. Sensation intact. Reflexes  intact.  SKIN: No ulceration, lesions, rashes, or cyanosis.  Skin warm and dry. Turgor intact.  PSYCHIATRIC: Mood, affect within normal limits. The patient is awake, alert and oriented x 3. Insight, judgment intact.       IMAGING   Study Result  Narrative & Impression  EXAM: CT CHEST, ABDOMEN AND PELVIS WITH CONTRAST 07/24/2024 05:48:42 AM   TECHNIQUE: CT of the chest, abdomen and pelvis was performed with the administration of intravenous contrast. Multiplanar reformatted images are provided for review. Automated exposure control, iterative reconstruction, and/or weight based adjustment of the mA/kV was utilized to reduce the radiation dose to as low as reasonably achievable.   COMPARISON: Chest CT 10/16/2023.   CLINICAL HISTORY: 63 year old female. Fall, left rib pain, abd pain, diarrhea, nausea.   FINDINGS:   CHEST:   MEDIASTINUM AND LYMPH NODES: Normal heart size with evidence of calcified coronary artery and aortic atherosclerosis. The central airways are clear. No mediastinal, hilar or axillary lymphadenopathy.   LUNGS AND PLEURA: Chronic emphysema. Large lung volumes. Trace retained secretions in the trachea. Central airways are patent. Occasional peripheral airway opacification due to retained secretion such as in the right lower lobe on series 4 image 111. No consolidation or pleural effusion. Mild generalized bronchial wall thickening. No suspicious pulmonary nodule.   ABDOMEN AND PELVIS:   LIVER: Subcentimeter but circumscribed hypodensity in the left hepatic lobe on series 2 image 56 appears to be a small benign cyst or hemangioma (no follow up imaging recommended). Liver enhancement remains normal.   GALLBLADDER AND BILE DUCTS: Gallbladder is distended (coronal image 28). No pericholecystic inflammation, sludge, or stones evident by CT. No biliary ductal dilatation.   SPLEEN: Diminutive spleen.   PANCREAS: No acute abnormality.   ADRENAL  GLANDS: No acute abnormality.   KIDNEYS, URETERS AND BLADDER: No stones in the kidneys or ureters. No hydronephrosis. No perinephric or periureteral stranding. Grossly negative urinary bladder.   GI AND BOWEL: Small gastric hiatal hernia. Motion artifact obscures detail of the distal small bowel in the pelvis and the rectosigmoid colon. Upstream large and small bowel loops are nondilated. Mildly redundant transverse colon. Cecum is on a lax mesentery. Normal gas containing appendix on coronal image 47.   REPRODUCTIVE ORGANS: Pelvis motion artifact. Obscured detail of the uterus and adnexa.   PERITONEUM AND RETROPERITONEUM: No ascites. No free air.   VASCULATURE: Advanced abdominal aortic calcified atherosclerosis continuing into the iliac arteries. Major arterial structures appear patent. Major portal venous structures appear patent.   ABDOMINAL AND PELVIS LYMPH NODES: No lymphadenopathy.   BONES AND SOFT TISSUES: Non displaced fractures of the anterior left left 5th and 6th ribs are subtle, new from the January CT. No displaced rib fracture. No superficial soft tissue injury. Maintained vertebral height. Lumbar disc and endplate degeneration. Pelvis motion artifact.   IMPRESSION: 1. Subtle nondisplaced fractures of the anterior left 5th and 6th ribs, new from prior chest CT. 2. Motion artifact in the pelvis.  No other acute traumatic injury identified. 3. Distended gallbladder but no CT evidence of acute cholecystitis. 4. Emphysema.  Mild airway thickening and retained secretions. 5. Coronary artery and aortic atherosclerosis.   Electronically signed by: Helayne Hurst MD 07/24/2024 06:16 AM EDT RP Workstation: [id number]     ASSESSMENT/PLAN    Acute hypoxemic respiratory failure -CT chest - no pneumonia or large aspiration   - She does have mild atelectasis and id like to try chest physiotherapy to recruit her bases and we should be able to wean her off oxygen.  She  takes  shallow breaths due to pain from left rib fractures.   - will order chest PT with incentive spirometry for now  - ddimer to rule out PE -this may be falsely elevated due to recent chest trauma but her contrasted CT chest did not pick up any thrombosis  Interstitial edema    Lasix 40 iv daily  Alcoholism   Patient reports ongoing alcohilism and recently tried to quit with onset of withdrawal symptoms.  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  -will need CIWA protocol    Anxiety and Depression    - patient reports depression , she states she was unable to have children and has no family or support.  She drinks and smokes to keep herself occupied but admits to worsening depression and alcoholism.  She asked if someone could help her discuss these issues.  -will place [facility] consultation and consider adult mental health evaluation           Thank you for allowing me to participate in the care of this patient.   Patient/Family are satisfied with care plan and all questions have been answered.    Provider disclosure: Patient with at least one acute or chronic illness or injury that poses a threat to life or bodily function and is being managed actively during this encounter.  All of the below services have been performed independently by signing provider:  review of prior documentation from internal and or external health records.  Review of previous and current lab results.  Interview and comprehensive assessment during patient visit today. Review of current and previous chest radiographs/CT scans. Discussion of management and test interpretation with health care team and patient/family.   This document was prepared using Dragon voice recognition software and may include unintentional dictation errors.     Jere Bostrom, M.D.  Division of  Pulmonary & Critical Care Medicine

## 2024-07-29 ENCOUNTER — Inpatient Hospital Stay

## 2024-07-29 DIAGNOSIS — E871 Hypo-osmolality and hyponatremia: Secondary | ICD-10-CM | POA: Diagnosis not present

## 2024-07-29 LAB — CBC
HCT: 32.4 % — ABNORMAL LOW (ref 36.0–46.0)
Hemoglobin: 11.2 g/dL — ABNORMAL LOW (ref 12.0–15.0)
MCH: 32.4 pg (ref 26.0–34.0)
MCHC: 34.6 g/dL (ref 30.0–36.0)
MCV: 93.6 fL (ref 80.0–100.0)
Platelets: 314 K/uL (ref 150–400)
RBC: 3.46 MIL/uL — ABNORMAL LOW (ref 3.87–5.11)
RDW: 12.6 % (ref 11.5–15.5)
WBC: 10.7 K/uL — ABNORMAL HIGH (ref 4.0–10.5)
nRBC: 0 % (ref 0.0–0.2)

## 2024-07-29 LAB — MAGNESIUM: Magnesium: 2.2 mg/dL (ref 1.7–2.4)

## 2024-07-29 LAB — BASIC METABOLIC PANEL WITH GFR
Anion gap: 8 (ref 5–15)
BUN: 9 mg/dL (ref 8–23)
CO2: 30 mmol/L (ref 22–32)
Calcium: 8.8 mg/dL — ABNORMAL LOW (ref 8.9–10.3)
Chloride: 94 mmol/L — ABNORMAL LOW (ref 98–111)
Creatinine, Ser: 0.38 mg/dL — ABNORMAL LOW (ref 0.44–1.00)
GFR, Estimated: >60 mL/min (ref >60)
Glucose, Bld: 95 mg/dL (ref 70–99)
Potassium: 3.1 mmol/L — ABNORMAL LOW (ref 3.5–5.1)
Sodium: 132 mmol/L — ABNORMAL LOW (ref 135–145)

## 2024-07-29 LAB — PHOSPHORUS: Phosphorus: 2.5 mg/dL (ref 2.5–4.6)

## 2024-07-29 MED ORDER — POTASSIUM CHLORIDE CRYS ER 20 MEQ PO TBCR
40.0000 meq | EXTENDED_RELEASE_TABLET | ORAL | Status: AC
  Administered 2024-07-29 (×3): 40 meq via ORAL
  Filled 2024-07-29 (×3): qty 2

## 2024-07-29 MED ORDER — UMECLIDINIUM BROMIDE 62.5 MCG/ACT IN AEPB
1.0000 | INHALATION_SPRAY | Freq: Every day | RESPIRATORY_TRACT | Status: DC
  Administered 2024-07-29 – 2024-08-01 (×4): 1 via RESPIRATORY_TRACT
  Filled 2024-07-29: qty 7

## 2024-07-29 MED ORDER — ROSUVASTATIN CALCIUM 5 MG PO TABS
5.0000 mg | ORAL_TABLET | Freq: Every evening | ORAL | Status: DC
  Administered 2024-07-29 – 2024-07-31 (×3): 5 mg via ORAL
  Filled 2024-07-29 (×3): qty 1

## 2024-07-29 MED ORDER — IOHEXOL 350 MG/ML SOLN
75.0000 mL | Freq: Once | INTRAVENOUS | Status: AC | PRN
  Administered 2024-07-29: 75 mL via INTRAVENOUS

## 2024-07-29 MED ORDER — MELATONIN 5 MG PO TABS
5.0000 mg | ORAL_TABLET | Freq: Every evening | ORAL | Status: DC | PRN
  Filled 2024-07-29: qty 1

## 2024-07-29 MED ORDER — ROSUVASTATIN CALCIUM 5 MG PO TABS: 5.0000 mg | ORAL_TABLET | Freq: Every day | ORAL | Status: DC

## 2024-07-29 NOTE — [place] FL2 (Signed)
 [place]  MEDICAID FL2 LEVEL OF CARE FORM     IDENTIFICATION  Patient Name: Christine Sparks Birthdate: March 12, 1961 Sex: female Admission Date (Current Location): 07/24/2024  [facility] and [place] Number:  Chiropodist and Address:  [facility], [address], [place], [place] [address]      Provider Number: [id number]  Attending Physician Name and Address:  Von Bellis, MD  Relative Name and Phone Number:       Current Level of Care: Hospital Recommended Level of Care: Skilled Nursing Facility Prior Approval Number:    Date Approved/Denied:   PASRR Number:    Discharge Plan: Home    Current Diagnoses: Patient Active Problem List   Diagnosis Date Noted   Alcohol use disorder 04/10/2024   Hypomagnesemia 03/28/2024   Nausea vomiting and diarrhea 03/28/2024   Panic attacks 11/26/2023   Depression, major, single episode, moderate (HCC) 11/17/2023   Mixed hyperlipidemia 06/01/2023   Cigarette nicotine  dependence without complication 06/01/2023   Hyponatremia 01/10/2023   Diverticulosis 01/10/2023   Allergic rhinitis due to pollen 01/10/2023   Vaginal atrophy 07/07/2016   History of cone biopsy of cervix 07/07/2016   Uterine leiomyoma 07/07/2016   History of endometriosis 07/07/2016   Vitamin D  deficiency 12/20/2013   History of cervical cancer 02/24/2013   B12 deficiency 02/24/2013   COPD (chronic obstructive pulmonary disease) (HCC) 11/07/2011   Anxiety    Tobacco abuse    Essential hypertension, benign    Menopausal syndrome     Orientation RESPIRATION BLADDER Height & Weight     Situation, Self, Time, Place  Normal Continent Weight: 121 lb 4.1 oz (55 kg) Height:  5' 5 (165.1 cm)  BEHAVIORAL SYMPTOMS/MOOD NEUROLOGICAL BOWEL NUTRITION STATUS      Continent Diet  AMBULATORY STATUS COMMUNICATION OF NEEDS Skin   Limited Assist Verbally Normal                       Personal Care Assistance Level of Assistance  Bathing,  Feeding, Dressing Bathing Assistance: Limited assistance Feeding assistance: Independent Dressing Assistance: Limited assistance     Functional Limitations Info  Sight, Hearing, Speech Sight Info: Adequate Hearing Info: Adequate Speech Info: Adequate    SPECIAL CARE FACTORS FREQUENCY  PT (By licensed PT), OT (By licensed OT)     PT Frequency: 5x/week OT Frequency: 5x/week            Contractures      Additional Factors Info  Code Status, Allergies Code Status Info: Full Allergies Info: Zithromax  (Azithromycin ), Quetiapine , Buspar  (Buspirone )           Current Medications (07/29/2024):  This is the current hospital active medication list Current Facility-Administered Medications  Medication Dose Route Frequency Provider Last Rate Last Admin   acetaminophen  (TYLENOL ) tablet 650 mg  650 mg Oral TID Von Bellis, MD   650 mg at 07/29/24 0851   amLODipine (NORVASC) tablet 10 mg  10 mg Oral Daily Von Bellis, MD   10 mg at 07/29/24 9147   arformoterol  (BROVANA ) nebulizer solution 15 mcg  15 mcg Nebulization BID Von Bellis, MD   15 mcg at 07/29/24 9247   And   budesonide  (PULMICORT ) nebulizer solution 0.25 mg  0.25 mg Nebulization BID Von Bellis, MD   0.25 mg at 07/29/24 9247   bisacodyl  (DULCOLAX) EC tablet 10 mg  10 mg Oral QHS Von Bellis, MD   10 mg at 07/28/24 2053   bisacodyl  (DULCOLAX) suppository 10  mg  10 mg Rectal Daily PRN Von Bellis, MD   10 mg at 07/28/24 1845   citalopram  (CELEXA ) tablet 40 mg  40 mg Oral Daily Duncan, Hazel V, MD   40 mg at 07/28/24 2051   cyanocobalamin  (VITAMIN B12) injection 1,000 mcg  1,000 mcg Intramuscular Daily Von Bellis, MD   1,000 mcg at 07/29/24 0851   Followed by   NOREEN ON 08/01/2024] cyanocobalamin  (VITAMIN B12) tablet 1,000 mcg  1,000 mcg Oral Daily Von Bellis, MD       enoxaparin  (LOVENOX ) injection 40 mg  40 mg Subcutaneous Q24H Von Bellis, MD   40 mg at 07/29/24 0850   folic acid  (FOLVITE ) tablet 1 mg   1 mg Oral Daily Von Bellis, MD   1 mg at 07/29/24 9147   guaiFENesin  (MUCINEX ) 12 hr tablet 600 mg  600 mg Oral BID Von Bellis, MD   600 mg at 07/29/24 0852   haloperidol lactate (HALDOL) injection 2 mg  2 mg Intravenous Q6H PRN Von Bellis, MD   2 mg at 07/24/24 9093   hydrALAZINE  (APRESOLINE ) injection 10 mg  10 mg Intravenous Q6H PRN Von Bellis, MD   10 mg at 07/27/24 0911   HYDROcodone-acetaminophen  (NORCO/VICODIN) 5-325 MG per tablet 1 tablet  1 tablet Oral Q6H PRN Von Bellis, MD   1 tablet at 07/29/24 0907   ipratropium-albuterol  (DUONEB) 0.5-2.5 (3) MG/3ML nebulizer solution 3 mL  3 mL Nebulization Q4H PRN Von Bellis, MD   3 mL at 07/29/24 9047   LORazepam  (ATIVAN ) tablet 0.5 mg  0.5 mg Oral BID PRN Von Bellis, MD   0.5 mg at 07/26/24 1600   magnesium  hydroxide (MILK OF MAGNESIA) suspension 30 mL  30 mL Oral Daily PRN Duncan, Hazel V, MD   30 mL at 07/28/24 2041   multivitamin with minerals tablet 1 tablet  1 tablet Oral Daily Von Bellis, MD   1 tablet at 07/29/24 0851   ondansetron  (ZOFRAN ) tablet 4 mg  4 mg Oral Q6H PRN Von Bellis, MD   4 mg at 07/26/24 0222   Or   ondansetron  (ZOFRAN ) injection 4 mg  4 mg Intravenous Q6H PRN Von Bellis, MD   4 mg at 07/26/24 2032   pantoprazole  (PROTONIX ) EC tablet 40 mg  40 mg Oral BID Von Bellis, MD   40 mg at 07/29/24 0851   polyethylene glycol (MIRALAX  / GLYCOLAX ) packet 17 g  17 g Oral BID Von Bellis, MD   17 g at 07/29/24 0850   potassium chloride SA (KLOR-CON M) CR tablet 40 mEq  40 mEq Oral Q4H Von Bellis, MD   40 mEq at 07/29/24 1141   predniSONE  (DELTASONE ) tablet 40 mg  40 mg Oral Q breakfast Von Bellis, MD   40 mg at 07/29/24 9148   ramipril  (ALTACE ) capsule 10 mg  10 mg Oral Daily Von Bellis, MD   10 mg at 07/29/24 9148   rosuvastatin  (CRESTOR ) tablet 5 mg  5 mg Oral QPM Von Bellis, MD       sodium chloride  flush (NS) 0.9 % injection 3 mL  3 mL Intravenous Q12H Von Bellis, MD   3 mL at  07/29/24 0853   sodium chloride  flush (NS) 0.9 % injection 3 mL  3 mL Intravenous Q12H Von Bellis, MD   3 mL at 07/29/24 0852   sodium chloride  flush (NS) 0.9 % injection 3 mL  3 mL Intravenous PRN Von Bellis, MD   3 mL at 07/25/24 2138  thiamine  (VITAMIN B1) tablet 100 mg  100 mg Oral Daily Von Bellis, MD   100 mg at 07/29/24 9147   Or   thiamine  (VITAMIN B1) injection 100 mg  100 mg Intravenous Daily Von Bellis, MD   100 mg at 07/25/24 0831   umeclidinium bromide  (INCRUSE ELLIPTA ) 62.5 MCG/ACT 1 puff  1 puff Inhalation Daily Von Bellis, MD         Discharge Medications: Please see discharge summary for a list of discharge medications.  Relevant Imaging Results:  Relevant Lab Results:   Additional Information SSN: [id number]  Alvaro Louder, LCSW

## 2024-07-29 NOTE — Progress Notes (Signed)
 Physical Therapy Treatment Patient Details Name: Christine Sparks MRN: [id number] DOB: [birth year] Today's Date: 07/29/2024   History of Present Illness Pt is a 63 y/o F presenting to ED with c/o weakness, diarrhea, fall at home. CT head and neck negative, CT chest/abdomen found subtle nondisplaced fractures of anterior L 5th and 6th ribs. MD assessment includes hypotonic hyponatremia, COPD exacerbation, cervical stenosis, diarrhea of unknown cause, ETOH use disorder. PMH significant for COPD, HTN, HLD.    PT Comments  Pt alert, pleasant and agreeable to participate in PT tx this date. Pt was received in bed, able to perform several repetitions of incentive spirometry prior to performing mobility, reaching ~1044mL inspiratory volume consistently. Pt performed 2 STS from EOB and 1 from toilet, each with RW and CGA, VC for hand placement on RW for safe transfers. Pt amb ~78ft total with several standing rest breaks encouraged incrementally to assess HR/SpO2. No LOB throughout amb, VC for RW positioning throughout to maintain BOS within RW frame. HR and SpO2 monitored throughout session, HR ranged from 89-107bpm and SpO2 remained >93% throughout session with pt on 2L at rest, 3L with ambulation- RN informed and aware of titration to 3L with mobility. Pt was left semi-supine in bed at end of session, on 2L [place], with all needs in reach. The patient would benefit from further skilled PT intervention to continue to progress towards goals.     If plan is discharge home, recommend the following: A little help with walking and/or transfers;A little help with bathing/dressing/bathroom;Assistance with cooking/housework;Assist for transportation;Help with stairs or ramp for entrance   Can travel by private vehicle     Yes  Equipment Recommendations  Other (comment) (TBD)    Recommendations for Other Services       Precautions / Restrictions Precautions Precautions: Fall Recall of Precautions/Restrictions:  Impaired Restrictions Weight Bearing Restrictions Per Provider Order: No     Mobility  Bed Mobility Overal bed mobility: Needs Assistance Bed Mobility: Supine to Sit, Sit to Supine     Supine to sit: Contact guard, HOB elevated, Used rails Sit to supine: Contact guard assist, HOB elevated   General bed mobility comments: no physical assistance required, HOB elevated to assist with trunk elevation    Transfers Overall transfer level: Needs assistance Equipment used: Rolling walker (2 wheels) Transfers: Sit to/from Stand Sit to Stand: Contact guard assist           General transfer comment: STS from EOBx2 and toilet with CGA, VC for hand placement on RW    Ambulation/Gait Ambulation/Gait assistance: Contact guard assist Gait Distance (Feet): 75 Feet Assistive device: Rolling walker (2 wheels) Gait Pattern/deviations: Step-through pattern, Decreased stride length, Narrow base of support Gait velocity: decreased     General Gait Details: No LOB throughout, intermittent standing rest breaks encouraged to assess HR/SpO2. Narrow BOS throughout with VC for RW positioning to maintain BOS within frame of RW   Stairs             Wheelchair Mobility     Tilt Bed    Modified Rankin (Stroke Patients Only)       Balance Overall balance assessment: Needs assistance Sitting-balance support: Feet supported Sitting balance-Leahy Scale: Good Sitting balance - Comments: steady static and dynamic sitting   Standing balance support: Bilateral upper extremity supported, No upper extremity supported, During functional activity Standing balance-Leahy Scale: Fair Standing balance comment: able to stand for brief management with steady balance  Communication Communication Communication: No apparent difficulties  Cognition Arousal: Alert Behavior During Therapy: WFL for tasks assessed/performed   PT - Cognitive impairments: No  apparent impairments                       PT - Cognition Comments: A&Ox4 Following commands: Intact Following commands impaired: Follows one step commands with increased time    Cueing Cueing Techniques: Verbal cues, Visual cues  Exercises Other Exercises Other Exercises: HR and SpO2 monitored throughout session: Pt on 2L [place] at rest SpO2 94% HR 89bpm. After supine > sit transfer SpO2 93% HR 94bpm. Titrated to 3L during ambulation HR maintained in high 90s to low 110s SpO2 >98%. Pt returned to 2L at end of session SpO2 98% HR 96bpm. RN notified Other Exercises: Pt performed several repetitions of incentive spirometry prior to mobility, able to reach ~1048mL inspiratory volume consistently    General Comments General comments (skin integrity, edema, etc.): pt performed pericare in sitting with supervision      Pertinent Vitals/Pain Pain Assessment Pain Assessment: 0-10 Pain Score: 5  Pain Location: L side flank/rib pain, after ambulation Pain Descriptors / Indicators: Aching, Grimacing, Sharp Pain Intervention(s): Monitored during session, Repositioned    Home Living                          Prior Function            PT Goals (current goals can now be found in the care plan section) Progress towards PT goals: Progressing toward goals    Frequency    Min 2X/week      PT Plan      Co-evaluation              AM-PAC PT 6 Clicks Mobility   Outcome Measure  Help needed turning from your back to your side while in a flat bed without using bedrails?: A Little Help needed moving from lying on your back to sitting on the side of a flat bed without using bedrails?: A Little Help needed moving to and from a bed to a chair (including a wheelchair)?: A Little Help needed standing up from a chair using your arms (e.g., wheelchair or bedside chair)?: A Little Help needed to walk in hospital room?: A Little Help needed climbing 3-5 steps with a railing?  : A Lot 6 Click Score: 17    End of Session Equipment Utilized During Treatment: Gait belt;Oxygen Activity Tolerance: Patient tolerated treatment well Patient left: in bed;with call bell/phone within reach;with bed alarm set (all 4 bed rails up per pt request) Nurse Communication: Mobility status PT Visit Diagnosis: Other abnormalities of gait and mobility (R26.89);Muscle weakness (generalized) (M62.81);History of falling (Z91.81);Difficulty in walking, not elsewhere classified (R26.2);Pain Pain - Right/Left: Left Pain - part of body:  (ribcage)     Time: 8396-8365 PT Time Calculation (min) (ACUTE ONLY): 31 min  Charges:    $Gait Training: 8-22 mins $Therapeutic Activity: 8-22 mins PT General Charges $$ ACUTE PT VISIT: 1 Visit                     Janell Axe, SPT

## 2024-07-29 NOTE — Plan of Care (Signed)

## 2024-07-29 NOTE — Progress Notes (Signed)
 Occupational Therapy Treatment Patient Details Name: Christine Sparks MRN: [id number] DOB: 1960-12-09 Today's Date: 07/29/2024   History of present illness Pt is a 63 y/o F presenting to ED with c/o weakness, diarrhea, fall at home. CT head and neck negative, CT chest/abdomen found subtle nondisplaced fractures of anterior L 5th and 6th ribs. MD assessment includes hypotonic hyponatremia, COPD exacerbation, cervical stenosis, diarrhea of unknown cause, ETOH use disorder. PMH significant for COPD, HTN, HLD.   OT comments  Patient seen for OT treatment on this date. Upon arrival to room patient sitting up right in bed, agreeable to treatment. Patient reports pain in ribs 5/10, did not limit therapy session. Patient performing bed/room mobility with increased time/effort and CGA in stance; O2 on 3L at start satting 95%, decreased to 2L and patient remained above 90% when checked throughout tx. Patient able to perform LB dressing, grooming and toilet transfer with SBA and cues for body mechanics. She continues to be limited in regards to activity tolerance but was.motivated and approprirate throughout session.  Patient ended treatment in bed with bed alarm on and all needs within reach. She requested all 4 bed rails up as it helps her with lifting up in bed due to rib pain. Patient making good progress toward goals, will continue to follow POC. Discharge recommendation remains appropriate.        If plan is discharge home, recommend the following:  A little help with walking and/or transfers;A little help with bathing/dressing/bathroom;Help with stairs or ramp for entrance;Direct supervision/assist for medications management;Direct supervision/assist for financial management   Equipment Recommendations  BSC/3in1    Recommendations for Other Services      Precautions / Restrictions Precautions Precautions: Fall Recall of Precautions/Restrictions: Impaired Restrictions Weight Bearing Restrictions  Per Provider Order: No       Mobility Bed Mobility Overal bed mobility: Needs Assistance Bed Mobility: Sidelying to Sit   Sidelying to sit: Supervision            Transfers Overall transfer level: Needs assistance Equipment used: Rolling walker (2 wheels) Transfers: Sit to/from Stand Sit to Stand: Contact guard assist     Step pivot transfers: Supervision, Contact guard assist     General transfer comment: sit to stand from EOB with walker with cues for hand placement, ambulated throughout room/bathroom with SBA, toilet transfer with cues for hand placement with grab bar     Balance                                           ADL either performed or assessed with clinical judgement   ADL Overall ADL's : Needs assistance/impaired     Grooming: Wash/dry face;Sitting               Lower Body Dressing: Contact guard assist Lower Body Dressing Details (indicate cue type and reason): able to don underpants while sitting/standing, CGA for standing Toilet Transfer: Minimal assistance;Grab bars;Rolling walker (2 wheels)                  Extremity/Trunk Assessment Upper Extremity Assessment Upper Extremity Assessment: Generalized weakness            Vision       Perception     Praxis     Communication Communication Communication: No apparent difficulties   Cognition Arousal: Alert Behavior During Therapy: WFL for tasks assessed/performed Cognition: No  family/caregiver present to determine baseline, Cognition impaired                               Following commands: Intact        Cueing   Cueing Techniques: Verbal cues, Visual cues  Exercises      Shoulder Instructions       General Comments started on 3L, satting 95%, decreased to 2L, O2 remained above 90%, notified RN    Pertinent Vitals/ Pain       Pain Assessment Pain Assessment: 0-10 Pain Score: 5  Pain Location: L side flank/rib pain, after  ambulation Pain Descriptors / Indicators: Aching, Grimacing, Sharp Pain Intervention(s): Monitored during session  Home Living                                          Prior Functioning/Environment              Frequency  Min 2X/week        Progress Toward Goals  OT Goals(current goals can now be found in the care plan section)  Progress towards OT goals: Progressing toward goals  Acute Rehab OT Goals Patient Stated Goal: to go home OT Goal Formulation: With patient Time For Goal Achievement: 08/08/24 Potential to Achieve Goals: Good ADL Goals Pt Will Perform Grooming: with modified independence;sitting Pt Will Perform Lower Body Dressing: with modified independence;sit to/from stand;sitting/lateral leans Pt Will Transfer to Toilet: with modified independence;ambulating Pt Will Perform Toileting - Clothing Manipulation and hygiene: with modified independence;sit to/from stand  Plan      Co-evaluation                 AM-PAC OT 6 Clicks Daily Activity     Outcome Measure   Help from another person eating meals?: None Help from another person taking care of personal grooming?: A Little Help from another person toileting, which includes using toliet, bedpan, or urinal?: A Lot Help from another person bathing (including washing, rinsing, drying)?: A Lot Help from another person to put on and taking off regular upper body clothing?: A Little Help from another person to put on and taking off regular lower body clothing?: A Lot 6 Click Score: 16    End of Session Equipment Utilized During Treatment: Rolling walker (2 wheels)  OT Visit Diagnosis: Other abnormalities of gait and mobility (R26.89);Muscle weakness (generalized) (M62.81)   Activity Tolerance Patient tolerated treatment well   Patient Left in bed;with call bell/phone within reach;with bed alarm set   Nurse Communication Other (comment) (O2 requirements)        Time:  8583-8562 OT Time Calculation (min): 21 min  Charges: OT General Charges $OT Visit: 1 Visit OT Treatments $Self Care/Home Management : 8-22 mins  Rogers Clause, OT/L MSOT, 07/29/2024

## 2024-07-29 NOTE — Care Management Important Message (Signed)
 Important Message  Patient Details  Name: Christine Sparks MRN: [id number] Date of Birth: 10-09-1960   Important Message Given:  Yes - Medicare IM     Cella Cappello W, CMA 07/29/2024, 2:45 PM

## 2024-07-29 NOTE — Progress Notes (Signed)
 [facility] Progress Note  Patient: Christine Sparks    FMW:[id number]  DOA: 07/24/2024     Date of Service: the patient was seen and examined on 07/29/2024  Chief Complaint  Patient presents with   Diarrhea   Brief hospital course: Christine Sparks is a 63 y.o. female with Past medical history of COPD, HTN, HLD, as reviewed from EMR, presented to [facility] ED with complaining of feeling weak and fell at home and did not left-sided ribs.  Complaining of pain on the left side and also mentioned to diarrhea for past 3 days with nausea, no vomiting.  Patient was not sure of hitting her head, denies any neck pain or back pain.  Patient reported some wheezing and feeling anxious.  No cough or congestion.  EMS did not notice any hypoxia, but placed on oxygen for comfort and anxiety. Patient drinks 5 to 6 glasses of wine every day, denies any drug abuse but she does smoke cigarettes.  Patient is trying to quit but is still she smokes off-and-on.  ED workup: Patient was found to have hyponatremia Left 5th and 6th rib fracture.  Nondisplaced EtOH abuse, placed on CIWA protocol.  TRH was consulted for admission and further management as below.  Assessment and Plan:    # Hypotonic hyponatremia Presented with generalized weakness and fall Serum osmolality 262, low Na 122 Continue fluid restriction 1.5 to 2/day Salt tablets 2 g twice daily Continue IV fluid for hydration Na 122>>130>128>130>132 Check sodium level daily Follow PT and OT eval   # COPD exacerbation S/p Solu-Medrol  125 mg given in the ED S/p Breztri  inhaler Started Mucinex  600 mg p.o. twice daily DuoNeb every 4 hourly 10/23 worsening of COPD, started Brovana  and Pulmicort  nebulizer twice daily Solu-Medrol  125 mg IV x 1 dose given, started Solu-Medrol  40 mg IV twice daily followed by tapering dose prednisone  CXR: Negative for any infiltrate RVP panel negative Pulmonary consulted, Lasix 40 mg IV daily x 2 doses given by  pulm Patient was using Trelegy inhaler at home, added Incruse Ellipta  during hospital stay   # Hypokalemia secondary to diuresis Potassium repleted Monitor electrolytes daily and replete as needed with   # Left 5th -- 8th Ribs fracture, nondisplaced. S/p fall at home secondary to dehydration due to diarrhea Continue as needed meds for pain control   # Cervical stenosis Continue as needed medication for pain control Recommend to follow with PCP and spine surgery as an outpatient if she experience any problems in the future.     # Diarrhea, unknown cause. Resolved  Last Diarrheal disorder was on 10/21 before admission No more diarrhea during hospital stay. S/p IVF, continue oral hydration S/p probiotics, Dc'd Use PPI for GI prophylaxis 10/25 patient is requesting for stool softeners.  Started laxatives    # EtOH use disorder: Patient is counseling done Patient drinks 5 to 6 glasses of wine daily Continue CIWA protocol and monitor for withdrawals Started multivitamin, folate and thiamine  10/24 started Valium  tapering dose for detox as patient is continuously having withdrawal symptoms.    # Nicotine  dependence, patient smokes cigarettes off-and-on to quit. Use nicotine  patch.  Smoking cessation counseling done.   # Hypertension Resume the lisinopril  10 mg p.o. daily home dose 10/25 started amlodipine 10 mg p.o. daily Use hydralazine  as needed Monitor BP and titrate medication accordingly   # Depression and anxiety Resumed Ativan  as needed home dose Patient used to take Celexa  but not taking anymore.   # Vitamin  B12 level 221, goal >400.   Started vitamin B12 1000 mcg IM injection daily during hospital stay, followed by oral supplement.  Follow-up PCP to repeat vitamin B12 level after 3 to 6 months.   Body mass index is 20.18 kg/m.  Interventions:   Diet: Cardiac diet DVT Prophylaxis: Subcutaneous Lovenox    Advance goals of care discussion: Full code  Family  Communication: family was not present at bedside, at the time of interview.  The pt provided permission to discuss medical plan with the family. Opportunity was given to ask question and all questions were answered satisfactorily.   Disposition:  Pt is from Home, admitted with hyponatremia, falls, left 5th and 6th fracture, EtOH abuse/withdrawal symptoms, still has withdrawal symptoms and intractable pain and low sodium, which precludes a safe discharge. Discharge to SNF, when stable, may need a few days to improve.  Subjective: No acute events overnight.  Patient still has significant shortness of breath, productive cough.  Pain is well-controlled.  Denies any withdrawal symptoms. Patient mentioned that she is using Trelegy at home and Crestor  Flucaine she uses medication.  We will continue management as above.   Physical Exam: General: NAD, lying comfortably Appear in no distress, affect appropriate Eyes: PERRLA ENT: Oral Mucosa Clear, moist  Neck: no JVD,  Cardiovascular: S1 and S2 Present, no Murmur,  Respiratory: Equal air entry bilaterally, no crackles, minimal wheezes, significant improvement noticed.  Abdomen: Bowel Sound present, Soft and no tenderness,  Skin: no rashes Extremities: no Pedal edema, no calf tenderness Neurologic: without any new focal findings Gait not checked due to patient safety concerns  Vitals:   07/28/24 1533 07/28/24 1926 07/29/24 0441 07/29/24 0747  BP: (!) 146/67 (!) 146/82 136/66 133/65  Pulse: 91 91 84 92  Resp: 17 17 17 20   Temp: 97.7 F (36.5 C) 97.8 F (36.6 C) 97.6 F (36.4 C) 98 F (36.7 C)  TempSrc: Oral   Oral  SpO2: 93% 94% 98% 97%  Weight:      Height:        Intake/Output Summary (Last 24 hours) at 07/29/2024 1511 Last data filed at 07/29/2024 0751 Gross per 24 hour  Intake 510 ml  Output --  Net 510 ml    Filed Weights   07/24/24 0416  Weight: 55 kg    Data Reviewed: I have personally reviewed and interpreted daily  labs, tele strips, imagings as discussed above. I reviewed all nursing notes, pharmacy notes, vitals, pertinent old records I have discussed plan of care as described above with RN and patient/family.  CBC: Recent Labs  Lab 07/24/24 0417 07/25/24 0408 07/26/24 0429 07/27/24 0326 07/28/24 0428 07/29/24 0525  WBC 10.4 11.1* 10.5 10.2 10.0 10.7*  NEUTROABS 8.3*  --   --   --   --   --   HGB 13.8 10.5* 10.1* 10.7* 10.9* 11.2*  HCT 38.3 29.4* 29.9* 31.7* 30.9* 32.4*  MCV 88.7 91.9 95.5 95.2 93.6 93.6  PLT 447* 335 314 338 320 314   Basic Metabolic Panel: Recent Labs  Lab 07/24/24 0417 07/24/24 1105 07/25/24 0408 07/26/24 0429 07/27/24 0326 07/28/24 0428 07/29/24 0525  NA 122*  --  128* 130* 128* 130* 132*  K 4.1  --  4.7 4.3 4.2 3.8 3.1*  CL 86*  --  97* 99 96* 98 94*  CO2 26  --  24 23 23 24 30   GLUCOSE 102*  --  101* 113* 102* 119* 95  BUN <5*  --  9  12 11 11 9   CREATININE 0.59  --  0.57 0.62 0.64 0.39* 0.38*  CALCIUM  8.6*  --  8.7* 8.7* 8.9 8.5* 8.8*  MG 1.8  --  1.9 2.0 2.1  --  2.2  PHOS  --  4.1 4.0 4.0 3.9  --  2.5    Studies: CT Angio Chest Pulmonary Embolism (PE) W or WO Contrast Result Date: 07/29/2024 EXAM: CTA of the Chest with contrast for PE 07/29/2024 08:56:18 AM TECHNIQUE: CTA of the chest was performed after the administration of 75 mL of iohexol  (OMNIPAQUE ) 350 MG/ML injection. Multiplanar reformatted images are provided for review. MIP images are provided for review. Automated exposure control, iterative reconstruction, and/or weight based adjustment of the mA/kV was utilized to reduce the radiation dose to as low as reasonably achievable. COMPARISON: 07/24/2024 CLINICAL HISTORY: Pulmonary embolism (PE) suspected, low to intermediate prob. Shortness of breath. FINDINGS: PULMONARY ARTERIES: Pulmonary arteries are adequately opacified for evaluation. No pulmonary embolism. MEDIASTINUM: The heart and pericardium demonstrate no acute abnormality. There is no  acute abnormality of the thoracic aorta. LYMPH NODES: No mediastinal, hilar or axillary lymphadenopathy. LUNGS AND PLEURA: Mild dependent atelectasis and small bilateral pleural effusions, right side greater than left. Moderate centrilobular emphysema. No focal consolidation or pulmonary edema. No pneumothorax. UPPER ABDOMEN: Limited images of the upper abdomen demonstrate mild central abdominal emphysema. SOFT TISSUES AND BONES: Mild diffuse body wall edema also noted. Mildly displaced fractures of the left anterior 5th through 8th ribs again noted. IMPRESSION: 1. No pulmonary embolism. 2. Small bilateral pleural effusions and mild dependent atelectasis, right greater than left. 3. Emphysema. 4. Mildly displaced fractures of left anterior 5th through 8th ribs. Electronically signed by: Norleen Kil MD 07/29/2024 09:36 AM EDT RP Workstation: [id number]     Scheduled Meds:  acetaminophen   650 mg Oral TID   amLODipine  10 mg Oral Daily   arformoterol   15 mcg Nebulization BID   And   budesonide  (PULMICORT ) nebulizer solution  0.25 mg Nebulization BID   bisacodyl   10 mg Oral QHS   citalopram   40 mg Oral Daily   cyanocobalamin   1,000 mcg Intramuscular Daily   Followed by   NOREEN ON 08/01/2024] vitamin B-12  1,000 mcg Oral Daily   enoxaparin  (LOVENOX ) injection  40 mg Subcutaneous Q24H   folic acid   1 mg Oral Daily   guaiFENesin   600 mg Oral BID   multivitamin with minerals  1 tablet Oral Daily   pantoprazole   40 mg Oral BID   polyethylene glycol  17 g Oral BID   potassium chloride  40 mEq Oral Q4H   predniSONE   40 mg Oral Q breakfast   ramipril   10 mg Oral Daily   sodium chloride  flush  3 mL Intravenous Q12H   sodium chloride  flush  3 mL Intravenous Q12H   thiamine   100 mg Oral Daily   Or   thiamine   100 mg Intravenous Daily   Continuous Infusions:   PRN Meds: bisacodyl , haloperidol lactate, hydrALAZINE , HYDROcodone-acetaminophen , ipratropium-albuterol , LORazepam , magnesium  hydroxide,  ondansetron  **OR** ondansetron  (ZOFRAN ) IV, sodium chloride  flush  Time spent: 55 minutes  Author: ELVAN SOR. MD [facility] 07/29/2024 3:11 PM  To reach On-call, see care teams to locate the attending and reach out to them via [email or web address]. If 7PM-7AM, please contact night-coverage If you still have difficulty reaching the attending provider, please page the [facility] (Director on Call) for [facility] on amion for assistance.

## 2024-07-29 NOTE — Progress Notes (Signed)
 PULMONOLOGY         Date: 07/29/2024,   MRN# [id number] Christine Sparks Feb 04, 1961     AdmissionWeight: 55 kg                 CurrentWeight: 55 kg  Referring provider: Dr Von   CHIEF COMPLAINT:   Chest pain and wheezing   HISTORY OF PRESENT ILLNESS     63 y.o. female with PMH of alcoholism, smoking COPD HTN, HLD who reports having diarreah for few days and fell with resultant trauma to thorax and had left rib fractures.  She was sent home on oxygen in Dec 2024 and was weaned off oxygen after few week.  She reports having withrdawal symptoms from alcoholism this week.   She denies ever having seizures. She is currently on 3L/min and reports chest discomfort but is oriented x 3 and is non labored with breathing while giving interview. Imaging showed Trace retained secretions in the trachea. Central airways are patent. Occasional peripheral airway opacification due to retained secretion such as in the right lower lobe.  Patient admits to choking on food and aspiration sometimes but seldom. pCCM consultation for further evaluation and management regarding hypemia, chest discomfort and possible PE.   07/28/24- patient seen at bedside.  Reports breathing is better but her O2 has not been weaned down.  She is able to take deep breath up to with incentive spirometer.  She should be getting close to baseline with resp status.  Today I hear mild pan inspiratory crepitations on ausclutation likely due to interstitial edema and have ordered lasix 40 iv daily.  Her renal function is normal.  She is being treated with benzos and antipsychotics and feels out of it but admits her withdrawal symptoms are resolved.   07/29/24- patient is on 2L/min.  She reports ongoing chest discomfort.  I met with RT Larry Sloate and he is attempting to wean patient off oxygen.  We will obtain CTPE today due to report of dyspnea and chest discomfort with hypoxemia and elevated ddimer   PAST MEDICAL  HISTORY   Past Medical History:  Diagnosis Date   Acute respiratory failure with hypoxia (HCC) 09/19/2023   Anemia    Anxiety    Arthritis    Bronchitis, chronic obstructive (HCC)    COPD exacerbation (HCC) 06/20/2023   Depression    Diabetes mellitus 10/2010   a1c 5.7, fasting glu 155   Dyspnea    with exertion   H/O bronchitis    Heart murmur    High cholesterol    Hypertension    Menopausal syndrome    NSTEMI (non-ST elevated myocardial infarction) (HCC) 09/19/2023   Tobacco abuse      SURGICAL HISTORY   Past Surgical History:  Procedure Laterality Date   BREAST BIOPSY Left 07/26/2016   complex sclerosing lesion. Coil shape marker   BREAST LUMPECTOMY Left 08/17/2016   complex sclerosing lesion with focal ADH. Clear margins. The coil shaped biopsy marker remains    BREAST LUMPECTOMY WITH NEEDLE LOCALIZATION Left 08/17/2016   Procedure: BREAST LUMPECTOMY WITH NEEDLE LOCALIZATION;  Surgeon: Louanne KANDICE Muse, MD;  Location: [facility] ORS;  Service: General;  Laterality: Left;   CERVICAL CONE BIOPSY     DILATION AND CURETTAGE OF UTERUS     EXCISION / BIOPSY BREAST / NIPPLE / DUCT Left 08/17/2016   complex sclerosing lesion removed   LIPOMA EXCISION Right    benign tumor right leg   LYMPH  NODE BIOPSY     TONSILLECTOMY       FAMILY HISTORY   Family History  Problem Relation Age of Onset   Cancer Mother        melanoma   Cancer Father        Lung Cancer   Early death Brother    Heart disease Brother    Breast cancer Paternal Aunt 16   Colon cancer Other    Ovarian cancer Neg Hx    Diabetes Neg Hx      SOCIAL HISTORY   Social History   Tobacco Use   Smoking status: Every Day    Average packs/day: 0.5 packs/day for 45.1 years (22.6 ttl pk-yrs)    Types: Cigarettes    Start date: 10/03/1978    Passive exposure: Past   Smokeless tobacco: Never  Vaping Use   Vaping status: Never Used  Substance Use Topics   Alcohol use: Yes    Alcohol/week: 6.0  standard drinks of alcohol    Types: 6 Glasses of wine per week    Comment: daily   Drug use: No     MEDICATIONS       Current Medication:  Current Facility-Administered Medications:    acetaminophen  (TYLENOL ) tablet 650 mg, 650 mg, Oral, TID, Von Bellis, MD, 650 mg at 07/28/24 2048   amLODipine (NORVASC) tablet 10 mg, 10 mg, Oral, Daily, Von Bellis, MD, 10 mg at 07/28/24 9195   arformoterol  (BROVANA ) nebulizer solution 15 mcg, 15 mcg, Nebulization, BID, 15 mcg at 07/29/24 0752 **AND** budesonide  (PULMICORT ) nebulizer solution 0.25 mg, 0.25 mg, Nebulization, BID, Von Bellis, MD, 0.25 mg at 07/29/24 9247   bisacodyl  (DULCOLAX) EC tablet 10 mg, 10 mg, Oral, QHS, Von Bellis, MD, 10 mg at 07/28/24 2053   bisacodyl  (DULCOLAX) suppository 10 mg, 10 mg, Rectal, Daily PRN, Von Bellis, MD, 10 mg at 07/28/24 1845   citalopram  (CELEXA ) tablet 40 mg, 40 mg, Oral, Daily, Cleatus Delayne GAILS, MD, 40 mg at 07/28/24 2051   cyanocobalamin  (VITAMIN B12) injection 1,000 mcg, 1,000 mcg, Intramuscular, Daily, 1,000 mcg at 07/28/24 0807 **FOLLOWED BY** [START ON 08/01/2024] cyanocobalamin  (VITAMIN B12) tablet 1,000 mcg, 1,000 mcg, Oral, Daily, Kumar, Dileep, MD   [COMPLETED] diazepam  (VALIUM ) tablet 5 mg, 5 mg, Oral, Q6H, 5 mg at 07/27/24 0155 **FOLLOWED BY** [COMPLETED] diazepam  (VALIUM ) tablet 5 mg, 5 mg, Oral, TID, 5 mg at 07/27/24 2218 **FOLLOWED BY** [COMPLETED] diazepam  (VALIUM ) tablet 5 mg, 5 mg, Oral, BID, 5 mg at 07/28/24 2050 **FOLLOWED BY** diazepam  (VALIUM ) tablet 5 mg, 5 mg, Oral, Q1200, Von Bellis, MD   enoxaparin  (LOVENOX ) injection 40 mg, 40 mg, Subcutaneous, Q24H, Von, Dileep, MD, 40 mg at 07/28/24 9193   folic acid  (FOLVITE ) tablet 1 mg, 1 mg, Oral, Daily, Von Bellis, MD, 1 mg at 07/28/24 0804   furosemide (LASIX) injection 40 mg, 40 mg, Intravenous, Daily, Maaliyah Adolph, MD, 40 mg at 07/28/24 1220   guaiFENesin  (MUCINEX ) 12 hr tablet 600 mg, 600 mg, Oral, BID, Von Bellis, MD, 600 mg at 07/28/24 0804   haloperidol lactate (HALDOL) injection 2 mg, 2 mg, Intravenous, Q6H PRN, Von Bellis, MD, 2 mg at 07/24/24 9093   hydrALAZINE  (APRESOLINE ) injection 10 mg, 10 mg, Intravenous, Q6H PRN, Von Bellis, MD, 10 mg at 07/27/24 0911   HYDROcodone-acetaminophen  (NORCO/VICODIN) 5-325 MG per tablet 1 tablet, 1 tablet, Oral, Q6H PRN, Von Bellis, MD, 1 tablet at 07/28/24 1619   ipratropium-albuterol  (DUONEB) 0.5-2.5 (3) MG/3ML nebulizer solution 3 mL, 3 mL,  Nebulization, Q4H PRN, Von Bellis, MD, 3 mL at 07/29/24 0355   LORazepam  (ATIVAN ) tablet 0.5 mg, 0.5 mg, Oral, BID PRN, Von Bellis, MD, 0.5 mg at 07/26/24 1600   magnesium  hydroxide (MILK OF MAGNESIA) suspension 30 mL, 30 mL, Oral, Daily PRN, Cleatus Delayne GAILS, MD, 30 mL at 07/28/24 2041   multivitamin with minerals tablet 1 tablet, 1 tablet, Oral, Daily, Von Bellis, MD, 1 tablet at 07/28/24 0803   ondansetron  (ZOFRAN ) tablet 4 mg, 4 mg, Oral, Q6H PRN, 4 mg at 07/26/24 0222 **OR** ondansetron  (ZOFRAN ) injection 4 mg, 4 mg, Intravenous, Q6H PRN, Von Bellis, MD, 4 mg at 07/26/24 2032   pantoprazole  (PROTONIX ) EC tablet 40 mg, 40 mg, Oral, BID, Von Bellis, MD, 40 mg at 07/28/24 2049   polyethylene glycol (MIRALAX  / GLYCOLAX ) packet 17 g, 17 g, Oral, BID, Von Bellis, MD, 17 g at 07/28/24 0805   [COMPLETED] methylPREDNISolone  sodium succinate (SOLU-MEDROL ) 40 mg/mL injection 40 mg, 40 mg, Intravenous, BID, 40 mg at 07/27/24 2220 **FOLLOWED BY** predniSONE  (DELTASONE ) tablet 40 mg, 40 mg, Oral, Q breakfast, Von Bellis, MD, 40 mg at 07/28/24 9196   ramipril  (ALTACE ) capsule 10 mg, 10 mg, Oral, Daily, Von Bellis, MD, 10 mg at 07/28/24 9193   sodium chloride  flush (NS) 0.9 % injection 3 mL, 3 mL, Intravenous, Q12H, Von Bellis, MD, 3 mL at 07/28/24 9192   sodium chloride  flush (NS) 0.9 % injection 3 mL, 3 mL, Intravenous, Q12H, Von Bellis, MD, 3 mL at 07/28/24 2054   sodium chloride  flush (NS) 0.9 %  injection 3 mL, 3 mL, Intravenous, PRN, Von Bellis, MD, 3 mL at 07/25/24 2138   sodium chloride  tablet 2 g, 2 g, Oral, BID WC, Von Bellis, MD, 2 g at 07/28/24 1619   thiamine  (VITAMIN B1) tablet 100 mg, 100 mg, Oral, Daily, 100 mg at 07/28/24 0804 **OR** thiamine  (VITAMIN B1) injection 100 mg, 100 mg, Intravenous, Daily, Von Bellis, MD, 100 mg at 07/25/24 0831    ALLERGIES   Zithromax  [azithromycin ], Quetiapine , and Buspar  [buspirone ]     REVIEW OF SYSTEMS    Review of Systems:  Gen:  Denies  fever, sweats, chills weigh loss  HEENT: Denies blurred vision, double vision, ear pain, eye pain, hearing loss, nose bleeds, sore throat Cardiac:  No dizziness, chest pain or heaviness, chest tightness,edema Resp:   reports dyspnea chronically  Gi: Denies swallowing difficulty, stomach pain, nausea or vomiting, diarrhea, constipation, bowel incontinence Gu:  Denies bladder incontinence, burning urine Ext:   Denies Joint pain, stiffness or swelling Skin: Denies  skin rash, easy bruising or bleeding or hives Endoc:  Denies polyuria, polydipsia , polyphagia or weight change Psych:   Denies depression, insomnia or hallucinations   Other:  All other systems negative   VS: BP 133/65 (BP Location: Left Arm)   Pulse 92   Temp 98 F (36.7 C) (Oral)   Resp 20   Ht 5' 5 (1.651 m)   Wt 55 kg   SpO2 97%   BMI 20.18 kg/m      PHYSICAL EXAM    GENERAL:NAD, no fevers, chills, no weakness no fatigue HEAD: Normocephalic, atraumatic.  EYES: Pupils equal, round, reactive to light. Extraocular muscles intact. No scleral icterus.  MOUTH: Moist mucosal membrane. Dentition intact. No abscess noted.  EAR, NOSE, THROAT: Clear without exudates. No external lesions.  NECK: Supple. No thyromegaly. No nodules. No JVD.  PULMONARY: decreased breath sounds with mild rhonchi worse at bases bilaterally.  CARDIOVASCULAR: S1 and  S2. Regular rate and rhythm. No murmurs, rubs, or gallops. No edema.  Pedal pulses 2+ bilaterally.  GASTROINTESTINAL: Soft, nontender, nondistended. No masses. Positive bowel sounds. No hepatosplenomegaly.  MUSCULOSKELETAL: No swelling, clubbing, or edema. Range of motion full in all extremities.  NEUROLOGIC: Cranial nerves II through XII are intact. No gross focal neurological deficits. Sensation intact. Reflexes intact.  SKIN: No ulceration, lesions, rashes, or cyanosis. Skin warm and dry. Turgor intact.  PSYCHIATRIC: Mood, affect within normal limits. The patient is awake, alert and oriented x 3. Insight, judgment intact.       IMAGING   Study Result  Narrative & Impression  EXAM: CT CHEST, ABDOMEN AND PELVIS WITH CONTRAST 07/24/2024 05:48:42 AM   TECHNIQUE: CT of the chest, abdomen and pelvis was performed with the administration of intravenous contrast. Multiplanar reformatted images are provided for review. Automated exposure control, iterative reconstruction, and/or weight based adjustment of the mA/kV was utilized to reduce the radiation dose to as low as reasonably achievable.   COMPARISON: Chest CT 10/16/2023.   CLINICAL HISTORY: 63 year old female. Fall, left rib pain, abd pain, diarrhea, nausea.   FINDINGS:   CHEST:   MEDIASTINUM AND LYMPH NODES: Normal heart size with evidence of calcified coronary artery and aortic atherosclerosis. The central airways are clear. No mediastinal, hilar or axillary lymphadenopathy.   LUNGS AND PLEURA: Chronic emphysema. Large lung volumes. Trace retained secretions in the trachea. Central airways are patent. Occasional peripheral airway opacification due to retained secretion such as in the right lower lobe on series 4 image 111. No consolidation or pleural effusion. Mild generalized bronchial wall thickening. No suspicious pulmonary nodule.   ABDOMEN AND PELVIS:   LIVER: Subcentimeter but circumscribed hypodensity in the left hepatic lobe on series 2 image 56 appears to be a small benign  cyst or hemangioma (no follow up imaging recommended). Liver enhancement remains normal.   GALLBLADDER AND BILE DUCTS: Gallbladder is distended (coronal image 28). No pericholecystic inflammation, sludge, or stones evident by CT. No biliary ductal dilatation.   SPLEEN: Diminutive spleen.   PANCREAS: No acute abnormality.   ADRENAL GLANDS: No acute abnormality.   KIDNEYS, URETERS AND BLADDER: No stones in the kidneys or ureters. No hydronephrosis. No perinephric or periureteral stranding. Grossly negative urinary bladder.   GI AND BOWEL: Small gastric hiatal hernia. Motion artifact obscures detail of the distal small bowel in the pelvis and the rectosigmoid colon. Upstream large and small bowel loops are nondilated. Mildly redundant transverse colon. Cecum is on a lax mesentery. Normal gas containing appendix on coronal image 47.   REPRODUCTIVE ORGANS: Pelvis motion artifact. Obscured detail of the uterus and adnexa.   PERITONEUM AND RETROPERITONEUM: No ascites. No free air.   VASCULATURE: Advanced abdominal aortic calcified atherosclerosis continuing into the iliac arteries. Major arterial structures appear patent. Major portal venous structures appear patent.   ABDOMINAL AND PELVIS LYMPH NODES: No lymphadenopathy.   BONES AND SOFT TISSUES: Non displaced fractures of the anterior left left 5th and 6th ribs are subtle, new from the January CT. No displaced rib fracture. No superficial soft tissue injury. Maintained vertebral height. Lumbar disc and endplate degeneration. Pelvis motion artifact.   IMPRESSION: 1. Subtle nondisplaced fractures of the anterior left 5th and 6th ribs, new from prior chest CT. 2. Motion artifact in the pelvis.  No other acute traumatic injury identified. 3. Distended gallbladder but no CT evidence of acute cholecystitis. 4. Emphysema.  Mild airway thickening and retained secretions. 5. Coronary artery and aortic  atherosclerosis.    Electronically signed by: Helayne Hurst MD 07/24/2024 06:16 AM EDT RP Workstation: [id number]     ASSESSMENT/PLAN    Acute hypoxemic respiratory failure -CT chest - no pneumonia or large aspiration   - She does have mild atelectasis and id like to try chest physiotherapy to recruit her bases and we should be able to wean her off oxygen.  She takes shallow breaths due to pain from left rib fractures.   - will order chest PT with incentive spirometry for now  - ddimer to rule out PE -this may be falsely elevated due to recent chest trauma but her contrasted CT chest did not pick up any thrombosis  Interstitial edema    Lasix 40 iv daily  Alcoholism   Patient reports ongoing alcohilism and recently tried to quit with onset of withdrawal symptoms.  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  -will need CIWA protocol    Anxiety and Depression    - patient reports depression , she states she was unable to have children and has no family or support.  She drinks and smokes to keep herself occupied but admits to worsening depression and alcoholism.  She asked if someone could help her discuss these issues.  -will place [facility] consultation and consider adult mental health evaluation           Thank you for allowing me to participate in the care of this patient.   Patient/Family are satisfied with care plan and all questions have been answered.    Provider disclosure: Patient with at least one acute or chronic illness or injury that poses a threat to life or bodily function and is being managed actively during this encounter.  All of the below services have been performed independently by signing provider:  review of prior documentation from internal and or external health records.  Review of previous and current lab results.  Interview and comprehensive  assessment during patient visit today. Review of current and previous chest radiographs/CT scans. Discussion of management and test interpretation with health care team and patient/family.   This document was prepared using Dragon voice recognition software and may include unintentional dictation errors.     Fredick Schlosser, M.D.  Division of Pulmonary & Critical Care Medicine

## 2024-07-29 NOTE — TOC CM/SW Note (Signed)
 Transition of Care (TOC) CM/SW Note   To Whom It May Concern:   Please be advised that the above-named patient will require a short-term nursing home stay - anticipated 30 days or less for rehabilitation and strengthening.  The plan is for return home

## 2024-07-29 NOTE — Plan of Care (Signed)

## 2024-07-30 DIAGNOSIS — E871 Hypo-osmolality and hyponatremia: Secondary | ICD-10-CM | POA: Diagnosis not present

## 2024-07-30 LAB — MAGNESIUM: Magnesium: 1.9 mg/dL (ref 1.7–2.4)

## 2024-07-30 LAB — CBC
HCT: 30.4 % — ABNORMAL LOW (ref 36.0–46.0)
Hemoglobin: 10.7 g/dL — ABNORMAL LOW (ref 12.0–15.0)
MCH: 32.7 pg (ref 26.0–34.0)
MCHC: 35.2 g/dL (ref 30.0–36.0)
MCV: 93 fL (ref 80.0–100.0)
Platelets: 321 K/uL (ref 150–400)
RBC: 3.27 MIL/uL — ABNORMAL LOW (ref 3.87–5.11)
RDW: 12.7 % (ref 11.5–15.5)
WBC: 8.8 K/uL (ref 4.0–10.5)
nRBC: 0 % (ref 0.0–0.2)

## 2024-07-30 LAB — BASIC METABOLIC PANEL WITH GFR
Anion gap: 6 (ref 5–15)
BUN: 8 mg/dL (ref 8–23)
CO2: 33 mmol/L — ABNORMAL HIGH (ref 22–32)
Calcium: 8.4 mg/dL — ABNORMAL LOW (ref 8.9–10.3)
Chloride: 92 mmol/L — ABNORMAL LOW (ref 98–111)
Creatinine, Ser: 0.38 mg/dL — ABNORMAL LOW (ref 0.44–1.00)
GFR, Estimated: >60 mL/min (ref >60)
Glucose, Bld: 88 mg/dL (ref 70–99)
Potassium: 3.9 mmol/L (ref 3.5–5.1)
Sodium: 131 mmol/L — ABNORMAL LOW (ref 135–145)

## 2024-07-30 LAB — BRAIN NATRIURETIC PEPTIDE: B Natriuretic Peptide: 94.9 pg/mL (ref 0.0–100.0)

## 2024-07-30 LAB — PHOSPHORUS: Phosphorus: 2.3 mg/dL — ABNORMAL LOW (ref 2.5–4.6)

## 2024-07-30 MED ORDER — POTASSIUM CHLORIDE CRYS ER 20 MEQ PO TBCR
20.0000 meq | EXTENDED_RELEASE_TABLET | Freq: Two times a day (BID) | ORAL | Status: DC
  Administered 2024-07-30 – 2024-08-01 (×5): 20 meq via ORAL
  Filled 2024-07-30 (×5): qty 1

## 2024-07-30 MED ORDER — POTASSIUM & SODIUM PHOSPHATES 280-160-250 MG PO PACK
2.0000 | PACK | Freq: Three times a day (TID) | ORAL | Status: AC
  Administered 2024-07-30 (×3): 2 via ORAL
  Filled 2024-07-30 (×3): qty 2

## 2024-07-30 MED ORDER — FUROSEMIDE 10 MG/ML IJ SOLN
40.0000 mg | Freq: Two times a day (BID) | INTRAMUSCULAR | Status: DC
  Administered 2024-07-30 – 2024-07-31 (×3): 40 mg via INTRAVENOUS
  Filled 2024-07-30 (×3): qty 4

## 2024-07-30 NOTE — Progress Notes (Signed)
 Occupational Therapy Treatment Patient Details Name: Christine Sparks MRN: [id number] DOB: 1960-11-03 Today's Date: 07/30/2024   History of present illness Pt is a 63 y/o F presenting to ED with c/o weakness, diarrhea, fall at home. CT head and neck negative, CT chest/abdomen found subtle nondisplaced fractures of anterior L 5th and 6th ribs. MD assessment includes hypotonic hyponatremia, COPD exacerbation, cervical stenosis, diarrhea of unknown cause, ETOH use disorder. PMH significant for COPD, HTN, HLD.   OT comments  Patient seen for OT treatment on this date. Upon arrival to room patient reports being nauseated from taking her meds on empty stomach but is  agreeable to treatment. Per pulmonology note, goal to dc O2; on 3L when OT started, satting above 95%, O2 slowly decreased to RA while she continued to sat above 90% while at rest; walking to/from bathroom she did drop to 83% and after several minutes of diaphragmatic breathing she was not able to achieve >90% so O2 reestablished on 1L, which achieved 90%. patient is currently on 1L, OT notified MD/RN via secure chat. OT faciliated there-ex for chest expansion with good return demo from patient (see below). Patient questioning going home vs SNF, discussed support systems that will be needed to be in place upon dc if her plan is to go home, patient agreeable to discuss with her support system to determine best course of action. Patient ended session sitting EOB with all needs within reach and bed alarm on.      If plan is discharge home, recommend the following:  A little help with walking and/or transfers;A little help with bathing/dressing/bathroom;Help with stairs or ramp for entrance;Direct supervision/assist for medications management;Direct supervision/assist for financial management   Equipment Recommendations  BSC/3in1    Recommendations for Other Services      Precautions / Restrictions Precautions Precautions: Fall Recall of  Precautions/Restrictions: Intact Restrictions Weight Bearing Restrictions Per Provider Order: No       Mobility Bed Mobility                    Transfers Overall transfer level: Needs assistance Equipment used: Quad cane Transfers: Sit to/from Stand Sit to Stand: Supervision           General transfer comment: sit<>stand from EOB x 1 and toilet tx x 1 with SBA, cues for pacing     Balance                                           ADL either performed or assessed with clinical judgement   ADL       Grooming: Wash/dry hands;Standing                   Toilet Transfer: Supervision/safety;Regular Toilet;Grab bars   Toileting- Architect and Hygiene: Supervision/safety;Sit to/from stand              Extremity/Trunk Assessment Upper Extremity Assessment Upper Extremity Assessment: Overall WFL for tasks assessed            Vision       Perception     Praxis     Communication Communication Communication: No apparent difficulties   Cognition Arousal: Alert Behavior During Therapy: WFL for tasks assessed/performed Cognition: No family/caregiver present to determine baseline, Cognition impaired  Following commands: Intact Following commands impaired: Follows multi-step commands with increased time      Cueing   Cueing Techniques: Verbal cues, Visual cues  Exercises Exercises: Other exercises Other Exercises Other Exercises: chest expansion exercises: scapular retraction x 10 reps, AAROM shoulder flexion x 10 reps    Shoulder Instructions       General Comments      Pertinent Vitals/ Pain       Pain Assessment Pain Assessment: No/denies pain  Home Living                                          Prior Functioning/Environment              Frequency  Min 2X/week        Progress Toward Goals  OT Goals(current goals can now be  found in the care plan section)  Progress towards OT goals: Progressing toward goals  Acute Rehab OT Goals Patient Stated Goal: to go home OT Goal Formulation: With patient Time For Goal Achievement: 08/08/24 Potential to Achieve Goals: Good ADL Goals Pt Will Perform Grooming: with modified independence;sitting Pt Will Perform Lower Body Dressing: with modified independence;sit to/from stand;sitting/lateral leans Pt Will Transfer to Toilet: with modified independence;ambulating Pt Will Perform Toileting - Clothing Manipulation and hygiene: with modified independence;sit to/from stand  Plan      Co-evaluation                 AM-PAC OT 6 Clicks Daily Activity     Outcome Measure   Help from another person eating meals?: None Help from another person taking care of personal grooming?: A Little Help from another person toileting, which includes using toliet, bedpan, or urinal?: A Little Help from another person bathing (including washing, rinsing, drying)?: A Little Help from another person to put on and taking off regular upper body clothing?: None Help from another person to put on and taking off regular lower body clothing?: A Little 6 Click Score: 20    End of Session Equipment Utilized During Treatment: Oxygen  OT Visit Diagnosis: Other abnormalities of gait and mobility (R26.89);Muscle weakness (generalized) (M62.81)   Activity Tolerance Patient tolerated treatment well   Patient Left in bed;with call bell/phone within reach;with bed alarm set   Nurse Communication Other (comment) (O2 requirements)        Time: 8854-8796 OT Time Calculation (min): 18 min  Charges: OT General Charges $OT Visit: 1 Visit OT Treatments $Self Care/Home Management : 8-22 mins  Rogers Clause, OT/L MSOT, 07/30/2024   Christine Sparks Clause 07/30/2024, 12:16 PM

## 2024-07-30 NOTE — Plan of Care (Signed)

## 2024-07-30 NOTE — Progress Notes (Signed)
 [facility] Progress Note  Patient: Christine Sparks    FMW:[id number]  DOA: 07/24/2024     Date of Service: the patient was seen and examined on 07/30/2024  Chief Complaint  Patient presents with   Diarrhea   Brief hospital course: Christine Sparks is a 63 y.o. female with Past medical history of COPD, HTN, HLD, as reviewed from EMR, presented to [facility] ED with complaining of feeling weak and fell at home and did not left-sided ribs.  Complaining of pain on the left side and also mentioned to diarrhea for past 3 days with nausea, no vomiting.  Patient was not sure of hitting her head, denies any neck pain or back pain.  Patient reported some wheezing and feeling anxious.  No cough or congestion.  EMS did not notice any hypoxia, but placed on oxygen for comfort and anxiety. Patient drinks 5 to 6 glasses of wine every day, denies any drug abuse but she does smoke cigarettes.  Patient is trying to quit but is still she smokes off-and-on.  ED workup: Patient was found to have hyponatremia Left 5th and 6th rib fracture.  Nondisplaced EtOH abuse, placed on CIWA protocol.  TRH was consulted for admission and further management as below.  Assessment and Plan:    # Hypotonic hyponatremia Presented with generalized weakness and fall Serum osmolality 262, low Na 122 Continue fluid restriction 1.5 to 2/day Salt tablets 2 g twice daily Continue IV fluid for hydration Na 122>>130>128>130>132>131 Check sodium level daily PT and OT eval done rec SNF   # Acute hypoxic respiratory failure due to COPD exacerbation S/p Solu-Medrol  125 mg given in the ED S/p Breztri  inhaler Started Mucinex  600 mg p.o. twice daily DuoNeb every 4 hourly 10/23 worsening of COPD, started Brovana  and Pulmicort  nebulizer twice daily Solu-Medrol  125 mg IV x 1 dose given, started Solu-Medrol  40 mg IV twice daily followed by tapering dose prednisone  CXR: Negative for any infiltrate RVP panel negative Pulmonary consulted,  started Lasix 40 mg IV twice daily  Patient was using Trelegy inhaler at home, added Incruse Ellipta  during hospital stay 10/28 RN was advised to wean off gradually, if not able to then we need to qualify her for home oxygen.  # Hypokalemia secondary to diuresis Potassium repleted # Hypophosphatemia, Phos repleted. Monitor electrolytes daily and replete as needed with   # Left 5th -- 8th Ribs fracture, nondisplaced. S/p fall at home secondary to dehydration due to diarrhea Continue as needed meds for pain control   # Cervical stenosis Continue as needed medication for pain control Recommend to follow with PCP and spine surgery as an outpatient if she experience any problems in the future.     # Diarrhea, unknown cause. Resolved  Last Diarrheal disorder was on 10/21 before admission No more diarrhea during hospital stay. S/p IVF, continue oral hydration S/p probiotics, Dc'd Use PPI for GI prophylaxis 10/25 patient is requesting for stool softeners.  Started laxatives    # EtOH use disorder: Patient is counseling done Patient drinks 5 to 6 glasses of wine daily Continue CIWA protocol and monitor for withdrawals Started multivitamin, folate and thiamine  10/24 started Valium  tapering dose for detox as patient was actively withdrawing   # Nicotine  dependence, patient smokes cigarettes off-and-on to quit. Use nicotine  patch.  Smoking cessation counseling done.   # Hypertension Resume the lisinopril  10 mg p.o. daily home dose 10/25 started amlodipine 10 mg p.o. daily Use hydralazine  as needed Monitor BP and titrate medication  accordingly   # Depression and anxiety Resumed Ativan  as needed home dose Patient used to take Celexa  but not taking anymore.   # Vitamin B12 level 221, goal >400.   Started vitamin B12 1000 mcg IM injection daily during hospital stay, followed by oral supplement.  Follow-up PCP to repeat vitamin B12 level after 3 to 6 months.   Body mass index is  20.18 kg/m.  Interventions:   Diet: Cardiac diet DVT Prophylaxis: Subcutaneous Lovenox    Advance goals of care discussion: Full code  Family Communication: family was not present at bedside, at the time of interview.  The pt provided permission to discuss medical plan with the family. Opportunity was given to ask question and all questions were answered satisfactorily.   Disposition:  Pt is from Home, admitted with hyponatremia, falls, left 5th and 6th fracture, EtOH abuse/withdrawal symptoms, still has withdrawal symptoms and intractable pain and low sodium, which precludes a safe discharge. Discharge to SNF vs Home w/HH, when stable, may need a few days to improve.  Subjective: No acute events overnight.  Patient vomited after taking morning meds, as per her she cannot take all the medication at once especially in the morning which made her stomach sick and she vomited.  Overall her physical activity is getting better, she thinks maybe she can go home when she improves Still has shortness of breath and productive cough, denies any chest pain or palpitations    Physical Exam: General: NAD, lying comfortably Appear in no distress, affect appropriate Eyes: PERRLA ENT: Oral Mucosa Clear, moist  Neck: no JVD,  Cardiovascular: S1 and S2 Present, no Murmur,  Respiratory: Equal air entry bilaterally, no crackles, minimal wheezes, significant improvement noticed.  Abdomen: Bowel Sound present, Soft and no tenderness,  Skin: no rashes Extremities: no Pedal edema, no calf tenderness Neurologic: without any new focal findings Gait not checked due to patient safety concerns  Vitals:   07/29/24 1951 07/30/24 0418 07/30/24 0816 07/30/24 1558  BP: 131/65 113/60 137/69 121/74  Pulse: 84 73 74 90  Resp: 17 17 16 16   Temp: 98 F (36.7 C) 98.2 F (36.8 C) 99.7 F (37.6 C) 98.2 F (36.8 C)  TempSrc: Oral Oral    SpO2: 96% 100% 98% (!) 85%  Weight:      Height:        Intake/Output  Summary (Last 24 hours) at 07/30/2024 1648 Last data filed at 07/30/2024 1524 Gross per 24 hour  Intake 240 ml  Output 150 ml  Net 90 ml    Filed Weights   07/24/24 0416  Weight: 55 kg    Data Reviewed: I have personally reviewed and interpreted daily labs, tele strips, imagings as discussed above. I reviewed all nursing notes, pharmacy notes, vitals, pertinent old records I have discussed plan of care as described above with RN and patient/family.  CBC: Recent Labs  Lab 07/24/24 0417 07/25/24 0408 07/26/24 0429 07/27/24 0326 07/28/24 0428 07/29/24 0525 07/30/24 0438  WBC 10.4   < > 10.5 10.2 10.0 10.7* 8.8  NEUTROABS 8.3*  --   --   --   --   --   --   HGB 13.8   < > 10.1* 10.7* 10.9* 11.2* 10.7*  HCT 38.3   < > 29.9* 31.7* 30.9* 32.4* 30.4*  MCV 88.7   < > 95.5 95.2 93.6 93.6 93.0  PLT 447*   < > 314 338 320 314 321   < > = values in this interval  not displayed.   Basic Metabolic Panel: Recent Labs  Lab 07/25/24 0408 07/26/24 0429 07/27/24 0326 07/28/24 0428 07/29/24 0525 07/30/24 0438  NA 128* 130* 128* 130* 132* 131*  K 4.7 4.3 4.2 3.8 3.1* 3.9  CL 97* 99 96* 98 94* 92*  CO2 24 23 23 24 30  33*  GLUCOSE 101* 113* 102* 119* 95 88  BUN 9 12 11 11 9 8   CREATININE 0.57 0.62 0.64 0.39* 0.38* 0.38*  CALCIUM  8.7* 8.7* 8.9 8.5* 8.8* 8.4*  MG 1.9 2.0 2.1  --  2.2 1.9  PHOS 4.0 4.0 3.9  --  2.5 2.3*    Studies: No results found.    Scheduled Meds:  acetaminophen   650 mg Oral TID   amLODipine  10 mg Oral Daily   arformoterol   15 mcg Nebulization BID   And   budesonide  (PULMICORT ) nebulizer solution  0.25 mg Nebulization BID   bisacodyl   10 mg Oral QHS   citalopram   40 mg Oral Daily   cyanocobalamin   1,000 mcg Intramuscular Daily   Followed by   NOREEN ON 08/01/2024] vitamin B-12  1,000 mcg Oral Daily   enoxaparin  (LOVENOX ) injection  40 mg Subcutaneous Q24H   folic acid   1 mg Oral Daily   furosemide  40 mg Intravenous BID   guaiFENesin   600 mg Oral  BID   multivitamin with minerals  1 tablet Oral Daily   pantoprazole   40 mg Oral BID   polyethylene glycol  17 g Oral BID   potassium & sodium phosphates  2 packet Oral TID WC & HS   potassium chloride  20 mEq Oral BID   rosuvastatin   5 mg Oral QPM   sodium chloride  flush  3 mL Intravenous Q12H   sodium chloride  flush  3 mL Intravenous Q12H   thiamine   100 mg Oral Daily   Or   thiamine   100 mg Intravenous Daily   umeclidinium bromide   1 puff Inhalation Daily   Continuous Infusions:   PRN Meds: bisacodyl , haloperidol lactate, hydrALAZINE , HYDROcodone-acetaminophen , ipratropium-albuterol , LORazepam , magnesium  hydroxide, melatonin, ondansetron  **OR** ondansetron  (ZOFRAN ) IV, sodium chloride  flush  Time spent: 40 minutes  Author: ELVAN SOR. MD [facility] 07/30/2024 4:48 PM  To reach On-call, see care teams to locate the attending and reach out to them via [email or web address]. If 7PM-7AM, please contact night-coverage If you still have difficulty reaching the attending provider, please page the [facility] (Director on Call) for [facility] on amion for assistance.

## 2024-07-30 NOTE — Progress Notes (Signed)
 Physical Therapy Treatment Patient Details Name: Christine Sparks MRN: [id number] DOB: [birth year] Today's Date: 07/30/2024   History of Present Illness Pt is a 63 y/o F presenting to ED with c/o weakness, diarrhea, fall at home. CT head and neck negative, CT chest/abdomen found subtle nondisplaced fractures of anterior L 5th and 6th ribs. MD assessment includes hypotonic hyponatremia, COPD exacerbation, cervical stenosis, diarrhea of unknown cause, ETOH use disorder. PMH significant for COPD, HTN, HLD.    PT Comments  Pt A&Ox4, pleasant and motivated to participate in PT tx. Pt was met sitting at EOB, asked author to get her a diet [place] to drink- confirmed with RN prior to giving to pt. Pt performed STS transfer from EOB with supervision, VC for hand placement on RW. Pt amb ~123ft total, several standing rest breaks taken during bout of amb to assess HR/SpO2. Pt demonstrated improved activity tolerance this session and was able to perform mobility on 1L [place] maintained during session, SpO2 >90% throughout with HR ranging between 93-105bpm during amb- RN informed. Pt was left sitting at EOB with all needs in reach, on 1L [place] with HR/SpO2 [facility]. The patient would benefit from further skilled PT intervention to continue to progress towards goals.     If plan is discharge home, recommend the following: A little help with walking and/or transfers;A little help with bathing/dressing/bathroom;Assistance with cooking/housework;Assist for transportation;Help with stairs or ramp for entrance   Can travel by private vehicle     Yes  Equipment Recommendations  Other (comment) (TBD)    Recommendations for Other Services       Precautions / Restrictions Precautions Precautions: Fall Recall of Precautions/Restrictions: Intact Restrictions Weight Bearing Restrictions Per Provider Order: No     Mobility  Bed Mobility               General bed mobility comments: NT, pt sitting at EOB pre/post  session    Transfers Overall transfer level: Needs assistance Equipment used: Rolling walker (2 wheels) Transfers: Sit to/from Stand Sit to Stand: Supervision           General transfer comment: STS from EOB with no physical assistance, VC for hand placement on RW    Ambulation/Gait Ambulation/Gait assistance: Contact guard assist Gait Distance (Feet): 150 Feet (with multiple standing rest breaks) Assistive device: Rolling walker (2 wheels) Gait Pattern/deviations: Step-through pattern, Decreased stride length, Narrow base of support Gait velocity: decreased     General Gait Details: No overt LOB but moderate bilateral knee instability noted with increased ambulation distance due to fatigue. Intermittent standing rest breaks to assess HR/SpO2. VC throughout to maintain BOS within RW frame.   Stairs             Wheelchair Mobility     Tilt Bed    Modified Rankin (Stroke Patients Only)       Balance Overall balance assessment: Needs assistance Sitting-balance support: Feet supported Sitting balance-Leahy Scale: Good Sitting balance - Comments: steady static and dynamic sitting, able to reach outside BOS   Standing balance support: Bilateral upper extremity supported, Reliant on assistive device for balance Standing balance-Leahy Scale: Fair Standing balance comment: improved balance with BUE support                            Communication Communication Communication: No apparent difficulties  Cognition Arousal: Alert Behavior During Therapy: WFL for tasks assessed/performed   PT - Cognitive impairments: No apparent impairments  PT - Cognition Comments: A&Ox4, pleasant throughout session- some long-term memory deficits possible due to pt repeating stories to dino that were told yesterday Following commands: Intact Following commands impaired: Follows multi-step commands with increased time    Cueing Cueing  Techniques: Verbal cues, Visual cues  Exercises Other Exercises Other Exercises: HR and SpO2 monitored throughout session: Pt on 1L [place] at rest SpO2 93% HR 84bpm. Pt maintained on 1L throughout ambulation, HR range 93-105bpm SpO2 90-95% during ambulation. At end of session HR 86bpm SpO2 94% on 1L. RN notified    General Comments        Pertinent Vitals/Pain Pain Assessment Pain Assessment: No/denies pain    Home Living                          Prior Function            PT Goals (current goals can now be found in the care plan section) Progress towards PT goals: Progressing toward goals    Frequency    Min 2X/week      PT Plan      Co-evaluation              AM-PAC PT 6 Clicks Mobility   Outcome Measure  Help needed turning from your back to your side while in a flat bed without using bedrails?: A Little Help needed moving from lying on your back to sitting on the side of a flat bed without using bedrails?: A Little Help needed moving to and from a bed to a chair (including a wheelchair)?: A Little Help needed standing up from a chair using your arms (e.g., wheelchair or bedside chair)?: A Little Help needed to walk in hospital room?: A Little Help needed climbing 3-5 steps with a railing? : A Lot 6 Click Score: 17    End of Session Equipment Utilized During Treatment: Gait belt;Oxygen Activity Tolerance: Patient tolerated treatment well Patient left: in bed;with call bell/phone within reach;with bed alarm set (pt sitting at EOB- RN informed) Nurse Communication: Mobility status PT Visit Diagnosis: Other abnormalities of gait and mobility (R26.89);Muscle weakness (generalized) (M62.81);History of falling (Z91.81);Difficulty in walking, not elsewhere classified (R26.2);Pain Pain - Right/Left: Left Pain - part of body:  (ribcage)     Time: 8483-8457 PT Time Calculation (min) (ACUTE ONLY): 26 min  Charges:    $Gait Training: 8-22  mins $Therapeutic Activity: 8-22 mins PT General Charges $$ ACUTE PT VISIT: 1 Visit                     Janell Axe, SPT

## 2024-07-30 NOTE — TOC Progression Note (Signed)
 Transition of Care [facility]) - Progression Note    Patient Details  Name: Christine Sparks MRN: [id number] Date of Birth: 1961-06-07  [facility] CM/SW Contact  Marinda Cooks, RN Phone Number: 07/30/2024, 4:24 PM  Clinical Narrative:    This CM updated pt is not medically cleared to dc today . Pt may need oxygen at dc per medical team . TOC will cont to follow dc planning / care coordination and update as applicable.     Expected Discharge Plan and Services    TBD   Social Drivers of Health (SDOH) Interventions SDOH Screenings   Food Insecurity: No Food Insecurity (07/24/2024)  Housing: Low Risk  (07/24/2024)  Transportation Needs: Unmet Transportation Needs (07/24/2024)  Utilities: Not At Risk (07/24/2024)  Alcohol Screen: Low Risk  (01/19/2024)  Depression (PHQ2-9): Medium Risk (06/13/2024)  Financial Resource Strain: Low Risk  (01/19/2024)  Physical Activity: Insufficiently Active (01/19/2024)  Social Connections: Moderately Isolated (07/24/2024)  Stress: No Stress Concern Present (01/19/2024)  Tobacco Use: High Risk (07/24/2024)  Health Literacy: Adequate Health Literacy (11/10/2023)    Readmission Risk Interventions     No data to display

## 2024-07-30 NOTE — Plan of Care (Signed)

## 2024-07-30 NOTE — Progress Notes (Addendum)
 PULMONOLOGY         Date: 07/30/2024,   MRN# [id number] Christine Sparks [birth year]     AdmissionWeight: 55 kg                 CurrentWeight: 55 kg  Referring provider: Dr Von   CHIEF COMPLAINT:   Chest pain and wheezing   HISTORY OF PRESENT ILLNESS     63 y.o. female with PMH of alcoholism, smoking COPD HTN, HLD who reports having diarreah for few days and fell with resultant trauma to thorax and had left rib fractures.  She was sent home on oxygen in Dec 2024 and was weaned off oxygen after few week.  She reports having withrdawal symptoms from alcoholism this week.   She denies ever having seizures. She is currently on 3L/min and reports chest discomfort but is oriented x 3 and is non labored with breathing while giving interview. Imaging showed Trace retained secretions in the trachea. Central airways are patent. Occasional peripheral airway opacification due to retained secretion such as in the right lower lobe.  Patient admits to choking on food and aspiration sometimes but seldom. pCCM consultation for further evaluation and management regarding hypemia, chest discomfort and possible PE.   07/28/24- patient seen at bedside.  Reports breathing is better but her O2 has not been weaned down.  She is able to take deep breath up to with incentive spirometer.  She should be getting close to baseline with resp status.  Today I hear mild pan inspiratory crepitations on ausclutation likely due to interstitial edema and have ordered lasix 40 iv daily.  Her renal function is normal.  She is being treated with benzos and antipsychotics and feels out of it but admits her withdrawal symptoms are resolved.   07/29/24- patient is on 2L/min.  She reports ongoing chest discomfort.  I met with RT Larry Sloate and he is attempting to wean patient off oxygen.  We will obtain CTPE today due to report of dyspnea and chest discomfort with hypoxemia and elevated ddimer  07/30/24-  patient is improved, she's on minimal O2 which I think can be removed at this time.  Her CTPE is negative for thrombosis but shows effusions and atelectasis which is what we expected as etiology of hypoxemia.  This confirms our medical plan is correct and we will continue diuresis and chest physiotherapy.  UOP is unmeasured.  I have dcd her ramipril  to allow us  to diurese her pleural effusions, and lasix has been increased to bid.  I would favor holding her antihypertensive therapy while we are diuresing.    PAST MEDICAL HISTORY   Past Medical History:  Diagnosis Date   Acute respiratory failure with hypoxia (HCC) 09/19/2023   Anemia    Anxiety    Arthritis    Bronchitis, chronic obstructive (HCC)    COPD exacerbation (HCC) 06/20/2023   Depression    Diabetes mellitus 10/2010   a1c 5.7, fasting glu 155   Dyspnea    with exertion   H/O bronchitis    Heart murmur    High cholesterol    Hypertension    Menopausal syndrome    NSTEMI (non-ST elevated myocardial infarction) (HCC) 09/19/2023   Tobacco abuse      SURGICAL HISTORY   Past Surgical History:  Procedure Laterality Date   BREAST BIOPSY Left 07/26/2016   complex sclerosing lesion. Coil shape marker   BREAST LUMPECTOMY Left 08/17/2016   complex sclerosing lesion with  focal ADH. Clear margins. The coil shaped biopsy marker remains    BREAST LUMPECTOMY WITH NEEDLE LOCALIZATION Left 08/17/2016   Procedure: BREAST LUMPECTOMY WITH NEEDLE LOCALIZATION;  Surgeon: Louanne KANDICE Muse, MD;  Location: [facility] ORS;  Service: General;  Laterality: Left;   CERVICAL CONE BIOPSY     DILATION AND CURETTAGE OF UTERUS     EXCISION / BIOPSY BREAST / NIPPLE / DUCT Left 08/17/2016   complex sclerosing lesion removed   LIPOMA EXCISION Right    benign tumor right leg   LYMPH NODE BIOPSY     TONSILLECTOMY       FAMILY HISTORY   Family History  Problem Relation Age of Onset   Cancer Mother        melanoma   Cancer Father        Lung  Cancer   Early death Brother    Heart disease Brother    Breast cancer Paternal Aunt 44   Colon cancer Other    Ovarian cancer Neg Hx    Diabetes Neg Hx      SOCIAL HISTORY   Social History   Tobacco Use   Smoking status: Every Day    Average packs/day: 0.5 packs/day for 45.1 years (22.6 ttl pk-yrs)    Types: Cigarettes    Start date: 10/03/1978    Passive exposure: Past   Smokeless tobacco: Never  Vaping Use   Vaping status: Never Used  Substance Use Topics   Alcohol use: Yes    Alcohol/week: 6.0 standard drinks of alcohol    Types: 6 Glasses of wine per week    Comment: daily   Drug use: No     MEDICATIONS       Current Medication:  Current Facility-Administered Medications:    acetaminophen  (TYLENOL ) tablet 650 mg, 650 mg, Oral, TID, Von Bellis, MD, 650 mg at 07/29/24 2111   amLODipine (NORVASC) tablet 10 mg, 10 mg, Oral, Daily, Von Bellis, MD, 10 mg at 07/29/24 9147   arformoterol  (BROVANA ) nebulizer solution 15 mcg, 15 mcg, Nebulization, BID, 15 mcg at 07/30/24 0739 **AND** budesonide  (PULMICORT ) nebulizer solution 0.25 mg, 0.25 mg, Nebulization, BID, Von Bellis, MD, 0.25 mg at 07/30/24 9260   bisacodyl  (DULCOLAX) EC tablet 10 mg, 10 mg, Oral, QHS, Von Bellis, MD, 10 mg at 07/29/24 2112   bisacodyl  (DULCOLAX) suppository 10 mg, 10 mg, Rectal, Daily PRN, Von Bellis, MD, 10 mg at 07/28/24 1845   citalopram  (CELEXA ) tablet 40 mg, 40 mg, Oral, Daily, Cleatus Hoof V, MD, 40 mg at 07/29/24 2111   cyanocobalamin  (VITAMIN B12) injection 1,000 mcg, 1,000 mcg, Intramuscular, Daily, 1,000 mcg at 07/29/24 0851 **FOLLOWED BY** [START ON 08/01/2024] cyanocobalamin  (VITAMIN B12) tablet 1,000 mcg, 1,000 mcg, Oral, Daily, Von Bellis, MD   enoxaparin  (LOVENOX ) injection 40 mg, 40 mg, Subcutaneous, Q24H, Von, Dileep, MD, 40 mg at 07/29/24 0850   folic acid  (FOLVITE ) tablet 1 mg, 1 mg, Oral, Daily, Von Bellis, MD, 1 mg at 07/29/24 9147   guaiFENesin  (MUCINEX )  12 hr tablet 600 mg, 600 mg, Oral, BID, Von Bellis, MD, 600 mg at 07/29/24 2111   haloperidol lactate (HALDOL) injection 2 mg, 2 mg, Intravenous, Q6H PRN, Von Bellis, MD, 2 mg at 07/24/24 0906   hydrALAZINE  (APRESOLINE ) injection 10 mg, 10 mg, Intravenous, Q6H PRN, Von Bellis, MD, 10 mg at 07/27/24 0911   HYDROcodone-acetaminophen  (NORCO/VICODIN) 5-325 MG per tablet 1 tablet, 1 tablet, Oral, Q6H PRN, Von Bellis, MD, 1 tablet at 07/29/24 2120   ipratropium-albuterol  (DUONEB)  0.5-2.5 (3) MG/3ML nebulizer solution 3 mL, 3 mL, Nebulization, Q4H PRN, Von Bellis, MD, 3 mL at 07/29/24 9047   LORazepam  (ATIVAN ) tablet 0.5 mg, 0.5 mg, Oral, BID PRN, Von Bellis, MD, 0.5 mg at 07/26/24 1600   magnesium  hydroxide (MILK OF MAGNESIA) suspension 30 mL, 30 mL, Oral, Daily PRN, Cleatus Delayne GAILS, MD, 30 mL at 07/28/24 2041   melatonin tablet 5 mg, 5 mg, Oral, QHS PRN, Cleatus Delayne GAILS, MD   multivitamin with minerals tablet 1 tablet, 1 tablet, Oral, Daily, Von Bellis, MD, 1 tablet at 07/29/24 9148   ondansetron  (ZOFRAN ) tablet 4 mg, 4 mg, Oral, Q6H PRN, 4 mg at 07/26/24 0222 **OR** ondansetron  (ZOFRAN ) injection 4 mg, 4 mg, Intravenous, Q6H PRN, Von Bellis, MD, 4 mg at 07/26/24 2032   pantoprazole  (PROTONIX ) EC tablet 40 mg, 40 mg, Oral, BID, Von Bellis, MD, 40 mg at 07/29/24 2112   polyethylene glycol (MIRALAX  / GLYCOLAX ) packet 17 g, 17 g, Oral, BID, Von Bellis, MD, 17 g at 07/29/24 0850   [COMPLETED] methylPREDNISolone  sodium succinate (SOLU-MEDROL ) 40 mg/mL injection 40 mg, 40 mg, Intravenous, BID, 40 mg at 07/27/24 2220 **FOLLOWED BY** predniSONE  (DELTASONE ) tablet 40 mg, 40 mg, Oral, Q breakfast, Von Bellis, MD, 40 mg at 07/29/24 9148   ramipril  (ALTACE ) capsule 10 mg, 10 mg, Oral, Daily, Von Bellis, MD, 10 mg at 07/29/24 9148   rosuvastatin  (CRESTOR ) tablet 5 mg, 5 mg, Oral, QPM, Von Bellis, MD, 5 mg at 07/29/24 1650   sodium chloride  flush (NS) 0.9 % injection 3 mL, 3 mL,  Intravenous, Q12H, Von Bellis, MD, 3 mL at 07/29/24 2115   sodium chloride  flush (NS) 0.9 % injection 3 mL, 3 mL, Intravenous, Q12H, Von Bellis, MD, 3 mL at 07/29/24 2115   sodium chloride  flush (NS) 0.9 % injection 3 mL, 3 mL, Intravenous, PRN, Von Bellis, MD, 3 mL at 07/25/24 2138   thiamine  (VITAMIN B1) tablet 100 mg, 100 mg, Oral, Daily, 100 mg at 07/29/24 0852 **OR** thiamine  (VITAMIN B1) injection 100 mg, 100 mg, Intravenous, Daily, Von Bellis, MD, 100 mg at 07/25/24 0831   umeclidinium bromide  (INCRUSE ELLIPTA ) 62.5 MCG/ACT 1 puff, 1 puff, Inhalation, Daily, Von Bellis, MD, 1 puff at 07/29/24 1649    ALLERGIES   Zithromax  [azithromycin ], Quetiapine , and Buspar  [buspirone ]     REVIEW OF SYSTEMS    Review of Systems:  Gen:  Denies  fever, sweats, chills weigh loss  HEENT: Denies blurred vision, double vision, ear pain, eye pain, hearing loss, nose bleeds, sore throat Cardiac:  No dizziness, chest pain or heaviness, chest tightness,edema Resp:   reports dyspnea chronically  Gi: Denies swallowing difficulty, stomach pain, nausea or vomiting, diarrhea, constipation, bowel incontinence Gu:  Denies bladder incontinence, burning urine Ext:   Denies Joint pain, stiffness or swelling Skin: Denies  skin rash, easy bruising or bleeding or hives Endoc:  Denies polyuria, polydipsia , polyphagia or weight change Psych:   Denies depression, insomnia or hallucinations   Other:  All other systems negative   VS: BP 113/60 (BP Location: Left Arm)   Pulse 73   Temp 98.2 F (36.8 C) (Oral)   Resp 17   Ht 5' 5 (1.651 m)   Wt 55 kg   SpO2 100%   BMI 20.18 kg/m      PHYSICAL EXAM    GENERAL:NAD, no fevers, chills, no weakness no fatigue HEAD: Normocephalic, atraumatic.  EYES: Pupils equal, round, reactive to light. Extraocular muscles intact. No scleral icterus.  MOUTH: Moist mucosal membrane. Dentition intact. No abscess noted.  EAR, NOSE, THROAT: Clear without  exudates. No external lesions.  NECK: Supple. No thyromegaly. No nodules. No JVD.  PULMONARY: decreased breath sounds with mild rhonchi worse at bases bilaterally.  CARDIOVASCULAR: S1 and S2. Regular rate and rhythm. No murmurs, rubs, or gallops. No edema. Pedal pulses 2+ bilaterally.  GASTROINTESTINAL: Soft, nontender, nondistended. No masses. Positive bowel sounds. No hepatosplenomegaly.  MUSCULOSKELETAL: No swelling, clubbing, or edema. Range of motion full in all extremities.  NEUROLOGIC: Cranial nerves II through XII are intact. No gross focal neurological deficits. Sensation intact. Reflexes intact.  SKIN: No ulceration, lesions, rashes, or cyanosis. Skin warm and dry. Turgor intact.  PSYCHIATRIC: Mood, affect within normal limits. The patient is awake, alert and oriented x 3. Insight, judgment intact.       IMAGING   Study Result  Narrative & Impression  EXAM: CT CHEST, ABDOMEN AND PELVIS WITH CONTRAST 07/24/2024 05:48:42 AM   TECHNIQUE: CT of the chest, abdomen and pelvis was performed with the administration of intravenous contrast. Multiplanar reformatted images are provided for review. Automated exposure control, iterative reconstruction, and/or weight based adjustment of the mA/kV was utilized to reduce the radiation dose to as low as reasonably achievable.   COMPARISON: Chest CT 10/16/2023.   CLINICAL HISTORY: 63 year old female. Fall, left rib pain, abd pain, diarrhea, nausea.   FINDINGS:   CHEST:   MEDIASTINUM AND LYMPH NODES: Normal heart size with evidence of calcified coronary artery and aortic atherosclerosis. The central airways are clear. No mediastinal, hilar or axillary lymphadenopathy.   LUNGS AND PLEURA: Chronic emphysema. Large lung volumes. Trace retained secretions in the trachea. Central airways are patent. Occasional peripheral airway opacification due to retained secretion such as in the right lower lobe on series 4 image 111. No  consolidation or pleural effusion. Mild generalized bronchial wall thickening. No suspicious pulmonary nodule.   ABDOMEN AND PELVIS:   LIVER: Subcentimeter but circumscribed hypodensity in the left hepatic lobe on series 2 image 56 appears to be a small benign cyst or hemangioma (no follow up imaging recommended). Liver enhancement remains normal.   GALLBLADDER AND BILE DUCTS: Gallbladder is distended (coronal image 28). No pericholecystic inflammation, sludge, or stones evident by CT. No biliary ductal dilatation.   SPLEEN: Diminutive spleen.   PANCREAS: No acute abnormality.   ADRENAL GLANDS: No acute abnormality.   KIDNEYS, URETERS AND BLADDER: No stones in the kidneys or ureters. No hydronephrosis. No perinephric or periureteral stranding. Grossly negative urinary bladder.   GI AND BOWEL: Small gastric hiatal hernia. Motion artifact obscures detail of the distal small bowel in the pelvis and the rectosigmoid colon. Upstream large and small bowel loops are nondilated. Mildly redundant transverse colon. Cecum is on a lax mesentery. Normal gas containing appendix on coronal image 47.   REPRODUCTIVE ORGANS: Pelvis motion artifact. Obscured detail of the uterus and adnexa.   PERITONEUM AND RETROPERITONEUM: No ascites. No free air.   VASCULATURE: Advanced abdominal aortic calcified atherosclerosis continuing into the iliac arteries. Major arterial structures appear patent. Major portal venous structures appear patent.   ABDOMINAL AND PELVIS LYMPH NODES: No lymphadenopathy.   BONES AND SOFT TISSUES: Non displaced fractures of the anterior left left 5th and 6th ribs are subtle, new from the January CT. No displaced rib fracture. No superficial soft tissue injury. Maintained vertebral height. Lumbar disc and endplate degeneration. Pelvis motion artifact.   IMPRESSION: 1. Subtle nondisplaced fractures of the anterior left 5th and  6th ribs, new from prior chest CT. 2.  Motion artifact in the pelvis.  No other acute traumatic injury identified. 3. Distended gallbladder but no CT evidence of acute cholecystitis. 4. Emphysema.  Mild airway thickening and retained secretions. 5. Coronary artery and aortic atherosclerosis.   Electronically signed by: Helayne Hurst MD 07/24/2024 06:16 AM EDT RP Workstation: [id number]     ASSESSMENT/PLAN    Acute hypoxemic respiratory failure -CT chest - no pneumonia or large aspiration   - She does have mild atelectasis and id like to try chest physiotherapy to recruit her bases and we should be able to wean her off oxygen.  She takes shallow breaths due to pain from left rib fractures.   - will order chest PT with incentive spirometry for now  - CT PE with atelectasis and effusions, continue crrent plan  Interstitial edema    Lasix 40 iv daily  Alcoholism   Patient reports ongoing alcohilism and recently tried to quit with onset of withdrawal symptoms.  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  -will need CIWA protocol    Anxiety and Depression    - patient reports depression , she states she was unable to have children and has no family or support.  She drinks and smokes to keep herself occupied but admits to worsening depression and alcoholism.  She asked if someone could help her discuss these issues.  -will place [facility] consultation and consider adult mental health evaluation           Thank you for allowing me to participate in the care of this patient.   Patient/Family are satisfied with care plan and all questions have been answered.    Provider disclosure: Patient with at least one acute or chronic illness or injury that poses a threat to life or bodily function and is being managed actively during this encounter.  All of the below services have been performed  independently by signing provider:  review of prior documentation from internal and or external health records.  Review of previous and current lab results.  Interview and comprehensive assessment during patient visit today. Review of current and previous chest radiographs/CT scans. Discussion of management and test interpretation with health care team and patient/family.   This document was prepared using Dragon voice recognition software and may include unintentional dictation errors.     Cailynn Bodnar, M.D.  Division of Pulmonary & Critical Care Medicine

## 2024-07-31 ENCOUNTER — Inpatient Hospital Stay: Admit: 2024-07-31 | Discharge: 2024-07-31 | Disposition: A | Attending: Student | Admitting: Student

## 2024-07-31 DIAGNOSIS — I5031 Acute diastolic (congestive) heart failure: Secondary | ICD-10-CM | POA: Diagnosis not present

## 2024-07-31 DIAGNOSIS — E871 Hypo-osmolality and hyponatremia: Secondary | ICD-10-CM | POA: Diagnosis not present

## 2024-07-31 LAB — BASIC METABOLIC PANEL WITH GFR
Anion gap: 10 (ref 5–15)
BUN: 9 mg/dL (ref 8–23)
CO2: 35 mmol/L — ABNORMAL HIGH (ref 22–32)
Calcium: 8.3 mg/dL — ABNORMAL LOW (ref 8.9–10.3)
Chloride: 84 mmol/L — ABNORMAL LOW (ref 98–111)
Creatinine, Ser: 0.54 mg/dL (ref 0.44–1.00)
GFR, Estimated: >60 mL/min (ref >60)
Glucose, Bld: 87 mg/dL (ref 70–99)
Potassium: 4.1 mmol/L (ref 3.5–5.1)
Sodium: 129 mmol/L — ABNORMAL LOW (ref 135–145)

## 2024-07-31 LAB — CBC
HCT: 31.8 % — ABNORMAL LOW (ref 36.0–46.0)
Hemoglobin: 10.9 g/dL — ABNORMAL LOW (ref 12.0–15.0)
MCH: 32.2 pg (ref 26.0–34.0)
MCHC: 34.3 g/dL (ref 30.0–36.0)
MCV: 94.1 fL (ref 80.0–100.0)
Platelets: 344 K/uL (ref 150–400)
RBC: 3.38 MIL/uL — ABNORMAL LOW (ref 3.87–5.11)
RDW: 12.7 % (ref 11.5–15.5)
WBC: 7.9 K/uL (ref 4.0–10.5)
nRBC: 0 % (ref 0.0–0.2)

## 2024-07-31 LAB — PHOSPHORUS: Phosphorus: 4.4 mg/dL (ref 2.5–4.6)

## 2024-07-31 LAB — ECHOCARDIOGRAM COMPLETE
Height: 65 in
S' Lateral: 1.63 cm
Weight: 1940.05 [oz_av]

## 2024-07-31 LAB — MAGNESIUM: Magnesium: 1.9 mg/dL (ref 1.7–2.4)

## 2024-07-31 MED ORDER — FUROSEMIDE 40 MG PO TABS
40.0000 mg | ORAL_TABLET | Freq: Two times a day (BID) | ORAL | Status: DC
  Administered 2024-07-31 – 2024-08-01 (×2): 40 mg via ORAL
  Filled 2024-07-31 (×2): qty 1

## 2024-07-31 NOTE — Progress Notes (Addendum)
 PROGRESS NOTE    Christine Sparks   FMW:[id number] DOB: 10-23-1960  DOA: 07/24/2024 Date of Service: 07/31/24 which is hospital day 7  PCP: Christine Hadassah SQUIBB, MD    Hospital course / significant events:   HPI:   10/22: admitted to hospitalist, sodium 122 likely d/t GI losses. Acute hypx resp fail likely COPD. Imaging concerning for rib fx, emphysema 10/23: COPD worsening, added nebs/steroids 10/24: EtOH withdrawals, started on CIWA 10/25-10/27: continue diuresing pulm edema, slow weaning O2, CIWA protocol. CT chest no new concerns 10/28: diuresing, wean O2 as able  10/29: Echo pending. Ordered home health PT/OT, DME incl BSC and O2      Consultants:  none  Procedures/Surgeries: none      ASSESSMENT & PLAN:   Hypotonic hyponatremia Concern for GI losses w/ N/V/D prior to admission Presented with generalized weakness and fall w/ Na 122 Na improved to 130-129, question EtOH component / SIADH w/ lung disease, fluid overload ikely new dx CHF (Echo pending)   fluid restriction 1.5 to 2/day Salt tablets have been dc Lasix    Acute hypoxic respiratory failure  due to COPD exacerbation Follow pulmonary outpatient  Continue O2 on discharge   Pulmonary edema S/p diuresis  Net IO Since Admission: 7,617.42 mL [07/31/24 1005] Echo pending Lasix transition to po today    Hypokalemia Hypophosphatemia Replace as needed Monitor BMP  Left 5th -- 8th Ribs fracture, nondisplaced. S/p fall at home secondary to dehydration due to diarrhea Continue as needed meds for pain control   Cervical stenosis Continue as needed medication for pain control Recommend to follow with PCP and spine surgery as an outpatient if she experience any problems in the future.   Diarrhea, unknown cause. Resolved  Last episode was 10/21 before admission Treat prn     EtOH use disorder Patient drinks 5 to 6 glasses of wine daily - concern w/ intake + benzo use  Started multivitamin, folate and  thiamine  10/24 started Valium  tapering dose for detox as patient was actively withdrawing  counseling done - pt has been on lorazepam  0.5 mg bid since filled 08/11, prior to that PDMP indicates fills for daily dosing. Last fill was 30 days supply on 10/01 so she should not have been out before 10/30   Nicotine  dependence,  patient smokes cigarettes off-and-on to quit. Use nicotine  patch / gum on discharge  Smoking cessation counseling done.   Hypertension Resume the lisinopril  10 mg p.o. daily home dose 10/25 started amlodipine 10 mg p.o. daily Monitor BP and titrate medication accordingly   Depression and anxiety Ativan  as needed home dose Patient used to take Celexa  but not taking anymore. Follow outpatient  pt sates she drinks when she is anxious, she requests for Ativan  on discharge which helps her anxiety a lot, and wants to follow up to address her anxiety outpatient - I educated re: benzodiazepine mechanism and dependence, discussed that outpatient provider(s) may decide NOT to continue this medication but they should be offering a plan to help her anxiety and her alcohol use. I advised NO alcohol intake while on these medications. Will consider rx on discharge    Vitamin B12 level 221, goal >400.    Started vitamin B12 1000 mcg IM injection daily during hospital stay, followed by oral supplement.   Follow-up PCP to repeat vitamin B12 level after 3 to 6 months.    No concerns based on BMI: Body mass index is 20.18 kg/m.[name] Significantly low or high BMI is associated  with higher medical risk.  Underweight - under 18  overweight - 25 to 29 obese - 30 or more Class 1 obesity: BMI of 30.0 to 34 Class 2 obesity: BMI of 35.0 to 39 Class 3 obesity: BMI of 40.0 to 49 Super Morbid Obesity: BMI 50-59 Super-super Morbid Obesity: BMI 60+ Healthy nutrition and physical activity advised as adjunct to other disease management and risk reduction treatments    DVT prophylaxis: lovenox  IV  fluids: no continuous IV fluids  Nutrition: cardiac diet --> regular w/ fluid restriction Central lines / other devices: none  Code Status: FULL CODE ACP documentation reviewed:  none on file in [facility]  TOC needs: HH/DME Medical barriers to dispo: echo. Expected medical readiness for discharge pending echo results, hoping dc tomorrow once HH/DME is arranged and pending echo.              Subjective / Brief ROS:  Patient reports anxiety when she is not drinking / when she is off benzos Denies CP/SOB.  Pain controlled.  Denies new weakness.  Tolerating diet .  Reports no concerns w/ urination/defecation. She is urinaring frequently on lasix   Family Communication: none at this time     Objective Findings:  Vitals:   07/31/24 1215 07/31/24 1217 07/31/24 1219 07/31/24 1428  BP:    110/76  Pulse:    87  Resp:    17  Temp:    97.7 F (36.5 C)  TempSrc:    Oral  SpO2: 94% (!) 82% 92% 94%  Weight:      Height:        Intake/Output Summary (Last 24 hours) at 07/31/2024 1509 Last data filed at 07/31/2024 1300 Gross per 24 hour  Intake 720 ml  Output --  Net 720 ml   Filed Weights   07/24/24 0416  Weight: 55 kg    Examination:  Physical Exam Constitutional:      General: She is not in acute distress. Cardiovascular:     Rate and Rhythm: Normal rate and regular rhythm.  Pulmonary:     Effort: Pulmonary effort is normal.     Breath sounds: Normal breath sounds.  Abdominal:     Palpations: Abdomen is soft.  Neurological:     Mental Status: She is alert and oriented to person, place, and time. Mental status is at baseline.  Psychiatric:        Mood and Affect: Mood normal.        Behavior: Behavior normal.          Scheduled Medications:   acetaminophen   650 mg Oral TID   amLODipine  10 mg Oral Daily   arformoterol   15 mcg Nebulization BID   And   budesonide  (PULMICORT ) nebulizer solution  0.25 mg Nebulization BID   bisacodyl   10 mg Oral QHS    citalopram   40 mg Oral Daily   [START ON 08/01/2024] vitamin B-12  1,000 mcg Oral Daily   enoxaparin  (LOVENOX ) injection  40 mg Subcutaneous Q24H   folic acid   1 mg Oral Daily   furosemide  40 mg Oral BID   guaiFENesin   600 mg Oral BID   multivitamin with minerals  1 tablet Oral Daily   pantoprazole   40 mg Oral BID   polyethylene glycol  17 g Oral BID   potassium chloride  20 mEq Oral BID   rosuvastatin   5 mg Oral QPM   sodium chloride  flush  3 mL Intravenous Q12H   sodium chloride  flush  3 mL Intravenous Q12H   thiamine   100 mg Oral Daily   Or   thiamine   100 mg Intravenous Daily   umeclidinium bromide   1 puff Inhalation Daily    Continuous Infusions:   PRN Medications:  bisacodyl , hydrALAZINE , HYDROcodone-acetaminophen , ipratropium-albuterol , LORazepam , magnesium  hydroxide, melatonin, ondansetron  **OR** ondansetron  (ZOFRAN ) IV, sodium chloride  flush  Antimicrobials from admission:  Anti-infectives (From admission, onward)    None           Data Reviewed:  I have personally reviewed the following...  CBC: Recent Labs  Lab 07/27/24 0326 07/28/24 0428 07/29/24 0525 07/30/24 0438 07/31/24 0339  WBC 10.2 10.0 10.7* 8.8 7.9  HGB 10.7* 10.9* 11.2* 10.7* 10.9*  HCT 31.7* 30.9* 32.4* 30.4* 31.8*  MCV 95.2 93.6 93.6 93.0 94.1  PLT 338 320 314 321 344   Basic Metabolic Panel: Recent Labs  Lab 07/26/24 0429 07/27/24 0326 07/28/24 0428 07/29/24 0525 07/30/24 0438 07/31/24 0339  NA 130* 128* 130* 132* 131* 129*  K 4.3 4.2 3.8 3.1* 3.9 4.1  CL 99 96* 98 94* 92* 84*  CO2 23 23 24 30  33* 35*  GLUCOSE 113* 102* 119* 95 88 87  BUN 12 11 11 9 8 9   CREATININE 0.62 0.64 0.39* 0.38* 0.38* 0.54  CALCIUM  8.7* 8.9 8.5* 8.8* 8.4* 8.3*  MG 2.0 2.1  --  2.2 1.9 1.9  PHOS 4.0 3.9  --  2.5 2.3* 4.4   GFR: Estimated Creatinine Clearance: 60.9 mL/min (by C-G formula based on SCr of 0.54 mg/dL). Liver Function Tests: Recent Labs  Lab 07/26/24 0429  AST 29  ALT 26   ALKPHOS 66  BILITOT 0.7  PROT 5.6*  ALBUMIN 3.2*   No results for input(s): LIPASE, AMYLASE in the last 168 hours. Recent Labs  Lab 07/26/24 0429  AMMONIA <13   Coagulation Profile: No results for input(s): INR, PROTIME in the last 168 hours. Cardiac Enzymes: No results for input(s): CKTOTAL, CKMB, CKMBINDEX, TROPONINI in the last 168 hours. BNP (last 3 results) No results for input(s): PROBNP in the last 8760 hours. HbA1C: No results for input(s): HGBA1C in the last 72 hours. CBG: No results for input(s): GLUCAP in the last 168 hours. Lipid Profile: No results for input(s): CHOL, HDL, LDLCALC, TRIG, CHOLHDL, LDLDIRECT in the last 72 hours. Thyroid  Function Tests: No results for input(s): TSH, T4TOTAL, FREET4, T3FREE, THYROIDAB in the last 72 hours. Anemia Panel: No results for input(s): VITAMINB12, FOLATE, FERRITIN, TIBC, IRON, RETICCTPCT in the last 72 hours. Most Recent Urinalysis On File:     Component Value Date/Time   COLORURINE YELLOW (A) 07/24/2024 1600   APPEARANCEUR CLEAR (A) 07/24/2024 1600   LABSPEC 1.041 (H) 07/24/2024 1600   PHURINE 6.0 07/24/2024 1600   GLUCOSEU NEGATIVE 07/24/2024 1600   HGBUR SMALL (A) 07/24/2024 1600   BILIRUBINUR NEGATIVE 07/24/2024 1600   KETONESUR NEGATIVE 07/24/2024 1600   PROTEINUR NEGATIVE 07/24/2024 1600   NITRITE NEGATIVE 07/24/2024 1600   LEUKOCYTESUR NEGATIVE 07/24/2024 1600   Sepsis Labs: @LABRCNTIP (procalcitonin:4,lacticidven:4) Microbiology: Recent Results (from the past 240 hours)  Resp panel by RT-PCR (RSV, Flu A&B, Covid) Anterior Nasal Swab     Status: None   Collection Time: 07/24/24  4:29 AM   Specimen: Anterior Nasal Swab  Result Value Ref Range Status   SARS Coronavirus 2 by RT PCR NEGATIVE NEGATIVE Final    Comment: (NOTE) SARS-CoV-2 target nucleic acids are NOT DETECTED.  The SARS-CoV-2 RNA is generally detectable in upper respiratory specimens  during the  acute phase of infection. The lowest concentration of SARS-CoV-2 viral copies this assay can detect is 138 copies/mL. A negative result does not preclude SARS-Cov-2 infection and should not be used as the sole basis for treatment or other patient management decisions. A negative result may occur with  improper specimen collection/handling, submission of specimen other than nasopharyngeal swab, presence of viral mutation(s) within the areas targeted by this assay, and inadequate number of viral copies(<138 copies/mL). A negative result must be combined with clinical observations, patient history, and epidemiological information. The expected result is Negative.  Fact Sheet for Patients:  bloggercourse.com  Fact Sheet for Healthcare Providers:  seriousbroker.it  This test is no t yet approved or cleared by the [place]  FDA and  has been authorized for detection and/or diagnosis of SARS-CoV-2 by FDA under an Emergency Use Authorization (EUA). This EUA will remain  in effect (meaning this test can be used) for the duration of the COVID-19 declaration under Section 564(b)(1) of the Act, 21 U.S.C.section 360bbb-3(b)(1), unless the authorization is terminated  or revoked sooner.       Influenza A by PCR NEGATIVE NEGATIVE Final   Influenza B by PCR NEGATIVE NEGATIVE Final    Comment: (NOTE) The Xpert Xpress SARS-CoV-2/FLU/RSV plus assay is intended as an aid in the diagnosis of influenza from Nasopharyngeal swab specimens and should not be used as a sole basis for treatment. Nasal washings and aspirates are unacceptable for Xpert Xpress SARS-CoV-2/FLU/RSV testing.  Fact Sheet for Patients: bloggercourse.com  Fact Sheet for Healthcare Providers: seriousbroker.it  This test is not yet approved or cleared by the [place]  FDA and has been authorized for detection  and/or diagnosis of SARS-CoV-2 by FDA under an Emergency Use Authorization (EUA). This EUA will remain in effect (meaning this test can be used) for the duration of the COVID-19 declaration under Section 564(b)(1) of the Act, 21 U.S.C. section 360bbb-3(b)(1), unless the authorization is terminated or revoked.     Resp Syncytial Virus by PCR NEGATIVE NEGATIVE Final    Comment: (NOTE) Fact Sheet for Patients: bloggercourse.com  Fact Sheet for Healthcare Providers: seriousbroker.it  This test is not yet approved or cleared by the [place]  FDA and has been authorized for detection and/or diagnosis of SARS-CoV-2 by FDA under an Emergency Use Authorization (EUA). This EUA will remain in effect (meaning this test can be used) for the duration of the COVID-19 declaration under Section 564(b)(1) of the Act, 21 U.S.C. section 360bbb-3(b)(1), unless the authorization is terminated or revoked.  Performed at [facility], [address] [place], [place] [address]   Respiratory (~20 pathogens) panel by PCR     Status: None   Collection Time: 07/27/24 11:25 AM   Specimen: Nasopharyngeal Swab; Respiratory  Result Value Ref Range Status   Adenovirus NOT DETECTED NOT DETECTED Final   Coronavirus 229E NOT DETECTED NOT DETECTED Final    Comment: (NOTE) The Coronavirus on the Respiratory Panel, DOES NOT test for the novel  Coronavirus (2019 nCoV)    Coronavirus HKU1 NOT DETECTED NOT DETECTED Final   Coronavirus NL63 NOT DETECTED NOT DETECTED Final   Coronavirus OC43 NOT DETECTED NOT DETECTED Final   Metapneumovirus NOT DETECTED NOT DETECTED Final   Rhinovirus / Enterovirus NOT DETECTED NOT DETECTED Final   Influenza A NOT DETECTED NOT DETECTED Final   Influenza B NOT DETECTED NOT DETECTED Final   Parainfluenza Virus 1 NOT DETECTED NOT DETECTED Final   Parainfluenza Virus 2 NOT DETECTED NOT DETECTED Final  Parainfluenza Virus 3  NOT DETECTED NOT DETECTED Final   Parainfluenza Virus 4 NOT DETECTED NOT DETECTED Final   Respiratory Syncytial Virus NOT DETECTED NOT DETECTED Final   Bordetella pertussis NOT DETECTED NOT DETECTED Final   Bordetella Parapertussis NOT DETECTED NOT DETECTED Final   Chlamydophila pneumoniae NOT DETECTED NOT DETECTED Final   Mycoplasma pneumoniae NOT DETECTED NOT DETECTED Final    Comment: Performed at [facility], [address], [place], [place] [address]      Radiology Studies last 3 days: ECHOCARDIOGRAM COMPLETE Result Date: 07/31/2024    ECHOCARDIOGRAM REPORT   Patient Name:   KEMIYAH TARAZON Date of Exam: 07/31/2024 Medical Rec #:  [id number]     Height:       65.0 in Accession #:    [id number]    Weight:       121.3 lb Date of Birth:  11-19-1960     BSA:          1.599 m Patient Age:    63 years      BP:           126/62 mmHg Patient Gender: F             HR:           67 bpm. Exam Location:  [facility] Procedure: 2D Echo, Cardiac Doppler and Color Doppler (Both Spectral and Color            Flow Doppler were utilized during procedure). Indications:     CHF-acute diastolic I50.31  History:         Patient has prior history of Echocardiogram examinations, most                  recent 09/20/2023. COPD. Tobacco abuse.  Sonographer:     Christopher Furnace Referring Phys:  [id number] ELVAN SOR Diagnosing Phys: Evalene Lunger MD  Sonographer Comments: Technically challenging study due to limited acoustic windows, no parasternal window and no apical window. Image acquisition challenging due to COPD and Subcostal was the only obtainable view. IMPRESSIONS  1. Left ventricular ejection fraction, by estimation, is 60 to 65%. The left ventricle has normal function. The left ventricle has no regional wall motion abnormalities. Left ventricular diastolic parameters are indeterminate.  2. Right ventricular systolic function is normal. The right ventricular size is normal.  3. The mitral valve is normal in structure. Mild  mitral valve regurgitation. No evidence of mitral stenosis.  4. The aortic valve is normal in structure. Aortic valve regurgitation is not visualized. No aortic stenosis is present.  5. The inferior vena cava is normal in size with greater than 50% respiratory variability, suggesting right atrial pressure of 3 mmHg. FINDINGS  Left Ventricle: Left ventricular ejection fraction, by estimation, is 60 to 65%. The left ventricle has normal function. The left ventricle has no regional wall motion abnormalities. Strain was performed and the global longitudinal strain is indeterminate. The left ventricular internal cavity size was normal in size. There is no left ventricular hypertrophy. Left ventricular diastolic parameters are indeterminate. Right Ventricle: The right ventricular size is normal. No increase in right ventricular wall thickness. Right ventricular systolic function is normal. Left Atrium: Left atrial size was normal in size. Right Atrium: Right atrial size was normal in size. Pericardium: There is no evidence of pericardial effusion. Mitral Valve: The mitral valve is normal in structure. Mild mitral valve regurgitation. No evidence of mitral valve stenosis. Tricuspid Valve: The tricuspid valve is normal  in structure. Tricuspid valve regurgitation is not demonstrated. No evidence of tricuspid stenosis. Aortic Valve: The aortic valve is normal in structure. Aortic valve regurgitation is not visualized. No aortic stenosis is present. Pulmonic Valve: The pulmonic valve was normal in structure. Pulmonic valve regurgitation is not visualized. No evidence of pulmonic stenosis. Aorta: The aortic root is normal in size and structure. Venous: The inferior vena cava is normal in size with greater than 50% respiratory variability, suggesting right atrial pressure of 3 mmHg. IAS/Shunts: No atrial level shunt detected by color flow Doppler. Additional Comments: 3D was performed not requiring image post processing on an  independent workstation and was indeterminate.  LEFT VENTRICLE PLAX 2D LVIDd:         2.43 cm LVIDs:         1.63 cm LV PW:         1.26 cm LV IVS:        0.98 cm  Evalene Lunger MD Electronically signed by Evalene Lunger MD Signature Date/Time: 07/31/2024/11:40:31 AM    Final    CT Angio Chest Pulmonary Embolism (PE) W or WO Contrast Result Date: 07/29/2024 EXAM: CTA of the Chest with contrast for PE 07/29/2024 08:56:18 AM TECHNIQUE: CTA of the chest was performed after the administration of 75 mL of iohexol  (OMNIPAQUE ) 350 MG/ML injection. Multiplanar reformatted images are provided for review. MIP images are provided for review. Automated exposure control, iterative reconstruction, and/or weight based adjustment of the mA/kV was utilized to reduce the radiation dose to as low as reasonably achievable. COMPARISON: 07/24/2024 CLINICAL HISTORY: Pulmonary embolism (PE) suspected, low to intermediate prob. Shortness of breath. FINDINGS: PULMONARY ARTERIES: Pulmonary arteries are adequately opacified for evaluation. No pulmonary embolism. MEDIASTINUM: The heart and pericardium demonstrate no acute abnormality. There is no acute abnormality of the thoracic aorta. LYMPH NODES: No mediastinal, hilar or axillary lymphadenopathy. LUNGS AND PLEURA: Mild dependent atelectasis and small bilateral pleural effusions, right side greater than left. Moderate centrilobular emphysema. No focal consolidation or pulmonary edema. No pneumothorax. UPPER ABDOMEN: Limited images of the upper abdomen demonstrate mild central abdominal emphysema. SOFT TISSUES AND BONES: Mild diffuse body wall edema also noted. Mildly displaced fractures of the left anterior 5th through 8th ribs again noted. IMPRESSION: 1. No pulmonary embolism. 2. Small bilateral pleural effusions and mild dependent atelectasis, right greater than left. 3. Emphysema. 4. Mildly displaced fractures of left anterior 5th through 8th ribs. Electronically signed by: Norleen Kil  MD 07/29/2024 09:36 AM EDT RP Workstation: [id number]       Time spent: 50 min     Laneta Blunt, DO [facility] 07/31/2024, 3:09 PM    Dictation software may have been used to generate the above note. Typos may occur and escape review in typed/dictated notes. Please contact Dr Blunt directly for clarity if needed.  Staff may message me via secure chat in Epic  but this may not receive an immediate response,  please page me for urgent matters!  If 7PM-7AM, please contact night coverage [email or web address]

## 2024-07-31 NOTE — Hospital Course (Addendum)
 Hospital course / significant events:   HPI:   10/22: admitted to hospitalist, sodium 122 likely d/t GI losses. Acute hypx resp fail likely COPD. Imaging concerning for rib fx, emphysema 10/23: COPD worsening, added nebs/steroids 10/24: EtOH withdrawals, started on CIWA 10/25-10/27: continue diuresing pulm edema, slow weaning O2, CIWA protocol. CT chest no new concerns 10/28: diuresing, wean O2 as able  10/29: Echo pending. Ordered home health PT/OT, DME incl BSC and O2  10/30: Echo preserved EF, indeterminate diast fxn, normal valve fxn. Suspect probably some diastolic dysfunction predisposing to fluid overload    Consultants:  none  Procedures/Surgeries: none      ASSESSMENT & PLAN:   Hypotonic hyponatremia Concern for GI losses w/ N/V/D prior to admission Presented with generalized weakness and fall w/ Na 122 Na improved to 130-129, question EtOH component / SIADH w/ lung disease, fluid overload ikely new dx CHF (Echo pending)   fluid restriction  Salt tablets have been dc Lasix for HFpEF   Acute hypoxic respiratory failure  due to COPD exacerbation Follow pulmonary outpatient  Continue O2 on discharge   Pulmonary edema S/p diuresis  Echo preserved EF, indeterminate diast fxn, normal valve fxn. Suspect probably some diastolic dysfunction predisposing to fluid overload  Lasix transition to po  Follow outpatient w/ PCP/cardiology    EtOH use disorder Patient drinks 5 to 6 glasses of wine daily - concern w/ intake + benzo use  Started multivitamin, folate and thiamine  10/24 started Valium  tapering dose for detox as patient was actively withdrawing   Benzodiazepine use / question dependence  counseling done - pt has been on lorazepam  0.5 mg bid since filled 08/11, prior to that PDMP indicates fills for daily dosing. Last fill was 30 days supply on 10/01 so she should not have been out before 10/30   Hypokalemia Hypophosphatemia Replace as needed Monitor  BMP  Left 5th -- 8th Ribs fracture, nondisplaced. S/p fall at home secondary to dehydration due to diarrhea Continue as needed meds for pain control   Cervical stenosis Continue as needed medication for pain control Recommend to follow with PCP and spine surgery as an outpatient    Diarrhea, unknown cause. Resolved  Last episode was 10/21 before admission Treat prn    Nicotine  dependence,  patient smokes cigarettes off-and-on to quit. Use nicotine  patch / gum on discharge  Smoking cessation counseling done.   Hypertension Resume the lisinopril  10 mg p.o. daily home dose 10/25 started amlodipine 10 mg p.o. daily Also on lasix as above  Monitor BP and titrate medication accordingly   Depression and anxiety Ativan  as needed home dose Patient used to take Celexa  but not taking anymore. Follow outpatient  pt sates she drinks when she is anxious, she requests for Ativan  on discharge which helps her anxiety a lot, and wants to follow up to address her anxiety outpatient - I educated re: benzodiazepine mechanism and dependence, discussed that outpatient provider(s) may decide NOT to continue this medication but they should be offering a plan to help her anxiety and her alcohol use. I advised NO alcohol intake while on these medications. Will consider rx on discharge    Vitamin B12 level 221, goal >400.    Started vitamin B12 1000 mcg IM injection daily during hospital stay, followed by oral supplement.   Follow-up PCP to repeat vitamin B12 level after 3 to 6 months.    No concerns based on BMI: Body mass index is 20.18 kg/m.[name] Significantly low or high BMI is  associated with higher medical risk.  Underweight - under 18  overweight - 25 to 29 obese - 30 or more Class 1 obesity: BMI of 30.0 to 34 Class 2 obesity: BMI of 35.0 to 39 Class 3 obesity: BMI of 40.0 to 49 Super Morbid Obesity: BMI 50-59 Super-super Morbid Obesity: BMI 60+ Healthy nutrition and physical activity advised  as adjunct to other disease management and risk reduction treatments    DVT prophylaxis: lovenox  IV fluids: no continuous IV fluids  Nutrition: cardiac diet --> regular w/ fluid restriction Central lines / other devices: none  Code Status: FULL CODE ACP documentation reviewed:  none on file in [facility]CA  TOC needs: HH/DME Medical barriers to dispo: none

## 2024-07-31 NOTE — Progress Notes (Signed)
*  PRELIMINARY RESULTS* Echocardiogram 2D Echocardiogram has been performed.  Christine Sparks 07/31/2024, 10:12 AM

## 2024-07-31 NOTE — Progress Notes (Signed)
 Physical Therapy Treatment Patient Details Name: Christine Sparks MRN: [id number] DOB: 02/28/1961 Today's Date: 07/31/2024   History of Present Illness Pt is a 63 y/o F presenting to ED with c/o weakness, diarrhea, fall at home. CT head and neck negative, CT chest/abdomen found subtle nondisplaced fractures of anterior L 5th and 6th ribs. MD assessment includes hypotonic hyponatremia, COPD exacerbation, cervical stenosis, diarrhea of unknown cause, ETOH use disorder. PMH significant for COPD, HTN, HLD.    PT Comments  Pt A&Ox4, pleasant and agreeable to participate in PT tx this date. Pt was met sitting at EOB, performs STS transfers with supervision and VC for hand placement on RW. Pt amb ~142ft with one standing rest break to assess HR/SpO2. No LOB throughout, VC for RW positioning and pursed lip breathing. Pt able to navigate set of 4 stairs with L hand rail assist and CGA. Mod VC and demonstration for BUE support on L hand rail for max stability with home navigation. HR and SpO2 monitored throughout session, titrated supplemental O2 to 3L for mobility and vitals remained [facility]- RN informed. Pt was left with OT at end of session. Pt continues to make strides toward goals, would benefit from additional skilled PT intervention to continue progressing activity tolerance and functional mobility.     If plan is discharge home, recommend the following: A little help with walking and/or transfers;A little help with bathing/dressing/bathroom;Assistance with cooking/housework;Assist for transportation;Help with stairs or ramp for entrance   Can travel by private vehicle     Yes  Equipment Recommendations  Other (comment) (TBD)    Recommendations for Other Services       Precautions / Restrictions Precautions Precautions: Fall Recall of Precautions/Restrictions: Intact Restrictions Weight Bearing Restrictions Per Provider Order: No     Mobility  Bed Mobility               General bed  mobility comments: NT, pt sitting at EOB pre/post session    Transfers Overall transfer level: Needs assistance Equipment used: Rolling walker (2 wheels) Transfers: Sit to/from Stand Sit to Stand: Supervision           General transfer comment: STS from EOB with no physical assistance, VC for hand placement on RW    Ambulation/Gait Ambulation/Gait assistance: Contact guard assist Gait Distance (Feet): 100 Feet Assistive device: Rolling walker (2 wheels) Gait Pattern/deviations: Step-through pattern, Decreased stride length, Narrow base of support Gait velocity: decreased     General Gait Details: One stanidng rest break during bout of amb to assess SpO2 and HR. No LOB throughout, VC for RW positioning   Stairs Stairs: Yes Stairs assistance: Contact guard assist Stair Management: One rail Left, Step to pattern, Forwards Number of Stairs: 4 General stair comments: Navigated set of 4 stairs with L hand rail and CGA. Mod VC for BUE support on L hand rail for max UE support and positioning of RW at top/bottom of steps to ensure safe home navigation. No LOB   Wheelchair Mobility     Tilt Bed    Modified Rankin (Stroke Patients Only)       Balance Overall balance assessment: Needs assistance Sitting-balance support: Feet supported Sitting balance-Leahy Scale: Good Sitting balance - Comments: steady static and dynamic sitting, able to reach outside BOS   Standing balance support: Bilateral upper extremity supported Standing balance-Leahy Scale: Fair Standing balance comment: improved balance with BUE support  Communication Communication Communication: No apparent difficulties  Cognition Arousal: Alert Behavior During Therapy: WFL for tasks assessed/performed   PT - Cognitive impairments: No apparent impairments                       PT - Cognition Comments: A&Ox4, pleasant and cooperative throughout Following  commands: Intact      Cueing Cueing Techniques: Verbal cues  Exercises Other Exercises Other Exercises: HR and SpO2 monitored throughout session: Pt on 1L [place] at rest SpO2 87% HR 92bpm. Titrated to 2L at rest SpO2 88%. SpO2 stabilized at 92% on 3L and maintained on 3L for ambulation. After ~34ft of amb SpO2 94% HR 103bpm. After completing stairs SpO2 93% HR 98bpm.    General Comments        Pertinent Vitals/Pain Pain Assessment Pain Assessment: 0-10 Pain Score: 4  Pain Location: L side flank/rib pain, after ambulation Pain Descriptors / Indicators: Aching, Grimacing, Sharp Pain Intervention(s): Monitored during session    Home Living                          Prior Function            PT Goals (current goals can now be found in the care plan section) Progress towards PT goals: Progressing toward goals    Frequency    Min 2X/week      PT Plan      Co-evaluation              AM-PAC PT 6 Clicks Mobility   Outcome Measure  Help needed turning from your back to your side while in a flat bed without using bedrails?: A Little Help needed moving from lying on your back to sitting on the side of a flat bed without using bedrails?: A Little Help needed moving to and from a bed to a chair (including a wheelchair)?: A Little Help needed standing up from a chair using your arms (e.g., wheelchair or bedside chair)?: A Little Help needed to walk in hospital room?: A Little Help needed climbing 3-5 steps with a railing? : A Little 6 Click Score: 18    End of Session Equipment Utilized During Treatment: Gait belt;Oxygen Activity Tolerance: Patient tolerated treatment well Patient left: Other (comment) (Pt left with OT to walk back to room) Nurse Communication: Mobility status PT Visit Diagnosis: Other abnormalities of gait and mobility (R26.89);Muscle weakness (generalized) (M62.81);History of falling (Z91.81);Difficulty in walking, not elsewhere classified  (R26.2);Pain Pain - Right/Left: Left Pain - part of body:  (ribcage)     Time: 1255-1316 PT Time Calculation (min) (ACUTE ONLY): 21 min  Charges:    $Gait Training: 8-22 mins PT General Charges $$ ACUTE PT VISIT: 1 Visit                     Janell Axe, SPT

## 2024-07-31 NOTE — Progress Notes (Signed)
 PULMONOLOGY         Date: 07/31/2024,   MRN# [id number] Christine Sparks 12/10/1960     AdmissionWeight: 55 kg                 CurrentWeight: 55 kg  Referring provider: Dr Von   CHIEF COMPLAINT:   Chest pain and wheezing   HISTORY OF PRESENT ILLNESS     63 y.o. female with PMH of alcoholism, smoking COPD HTN, HLD who reports having diarreah for few days and fell with resultant trauma to thorax and had left rib fractures.  She was sent home on oxygen in Dec 2024 and was weaned off oxygen after few week.  She reports having withrdawal symptoms from alcoholism this week.   She denies ever having seizures. She is currently on 3L/min and reports chest discomfort but is oriented x 3 and is non labored with breathing while giving interview. Imaging showed Trace retained secretions in the trachea. Central airways are patent. Occasional peripheral airway opacification due to retained secretion such as in the right lower lobe.  Patient admits to choking on food and aspiration sometimes but seldom. pCCM consultation for further evaluation and management regarding hypemia, chest discomfort and possible PE.   07/28/24- patient seen at bedside.  Reports breathing is better but her O2 has not been weaned down.  She is able to take deep breath up to with incentive spirometer.  She should be getting close to baseline with resp status.  Today I hear mild pan inspiratory crepitations on ausclutation likely due to interstitial edema and have ordered lasix 40 iv daily.  Her renal function is normal.  She is being treated with benzos and antipsychotics and feels out of it but admits her withdrawal symptoms are resolved.   07/29/24- patient is on 2L/min.  She reports ongoing chest discomfort.  I met with RT Larry Sloate and he is attempting to wean patient off oxygen.  We will obtain CTPE today due to report of dyspnea and chest discomfort with hypoxemia and elevated ddimer  07/30/24-  patient is improved, she's on minimal O2 which I think can be removed at this time.  Her CTPE is negative for thrombosis but shows effusions and atelectasis which is what we expected as etiology of hypoxemia.  This confirms our medical plan is correct and we will continue diuresis and chest physiotherapy.  UOP is unmeasured.  I have dcd her ramipril  to allow us  to diurese her pleural effusions, and lasix has been increased to bid.  I would favor holding her antihypertensive therapy while we are diuresing.    07/31/24- patient diuresed overnight and is close to baseline now, she should be able to wean off oxygen or may need it on outpatient for short time while recovering from multiple rib fractures.  I can follow up with her on outpatient.  She can be dcd today if approved by attending physician.    PAST MEDICAL HISTORY   Past Medical History:  Diagnosis Date   Acute respiratory failure with hypoxia (HCC) 09/19/2023   Anemia    Anxiety    Arthritis    Bronchitis, chronic obstructive (HCC)    COPD exacerbation (HCC) 06/20/2023   Depression    Diabetes mellitus 10/2010   a1c 5.7, fasting glu 155   Dyspnea    with exertion   H/O bronchitis    Heart murmur    High cholesterol    Hypertension    Menopausal syndrome  NSTEMI (non-ST elevated myocardial infarction) (HCC) 09/19/2023   Tobacco abuse      SURGICAL HISTORY   Past Surgical History:  Procedure Laterality Date   BREAST BIOPSY Left 07/26/2016   complex sclerosing lesion. Coil shape marker   BREAST LUMPECTOMY Left 08/17/2016   complex sclerosing lesion with focal ADH. Clear margins. The coil shaped biopsy marker remains    BREAST LUMPECTOMY WITH NEEDLE LOCALIZATION Left 08/17/2016   Procedure: BREAST LUMPECTOMY WITH NEEDLE LOCALIZATION;  Surgeon: Louanne KANDICE Muse, MD;  Location: [facility] ORS;  Service: General;  Laterality: Left;   CERVICAL CONE BIOPSY     DILATION AND CURETTAGE OF UTERUS     EXCISION / BIOPSY BREAST /  NIPPLE / DUCT Left 08/17/2016   complex sclerosing lesion removed   LIPOMA EXCISION Right    benign tumor right leg   LYMPH NODE BIOPSY     TONSILLECTOMY       FAMILY HISTORY   Family History  Problem Relation Age of Onset   Cancer Mother        melanoma   Cancer Father        Lung Cancer   Early death Brother    Heart disease Brother    Breast cancer Paternal Aunt 79   Colon cancer Other    Ovarian cancer Neg Hx    Diabetes Neg Hx      SOCIAL HISTORY   Social History   Tobacco Use   Smoking status: Every Day    Average packs/day: 0.5 packs/day for 45.1 years (22.6 ttl pk-yrs)    Types: Cigarettes    Start date: 10/03/1978    Passive exposure: Past   Smokeless tobacco: Never  Vaping Use   Vaping status: Never Used  Substance Use Topics   Alcohol use: Yes    Alcohol/week: 6.0 standard drinks of alcohol    Types: 6 Glasses of wine per week    Comment: daily   Drug use: No     MEDICATIONS       Current Medication:  Current Facility-Administered Medications:    acetaminophen  (TYLENOL ) tablet 650 mg, 650 mg, Oral, TID, Von Bellis, MD, 650 mg at 07/30/24 2137   amLODipine (NORVASC) tablet 10 mg, 10 mg, Oral, Daily, Von Bellis, MD, 10 mg at 07/30/24 9062   arformoterol  (BROVANA ) nebulizer solution 15 mcg, 15 mcg, Nebulization, BID, 15 mcg at 07/31/24 0757 **AND** budesonide  (PULMICORT ) nebulizer solution 0.25 mg, 0.25 mg, Nebulization, BID, Von Bellis, MD, 0.25 mg at 07/31/24 0757   bisacodyl  (DULCOLAX) EC tablet 10 mg, 10 mg, Oral, QHS, Kumar, Dileep, MD, 10 mg at 07/30/24 2136   bisacodyl  (DULCOLAX) suppository 10 mg, 10 mg, Rectal, Daily PRN, Von Bellis, MD, 10 mg at 07/28/24 1845   citalopram  (CELEXA ) tablet 40 mg, 40 mg, Oral, Daily, Cleatus Hoof V, MD, 40 mg at 07/30/24 2136   cyanocobalamin  (VITAMIN B12) injection 1,000 mcg, 1,000 mcg, Intramuscular, Daily, 1,000 mcg at 07/30/24 0941 **FOLLOWED BY** [START ON 08/01/2024] cyanocobalamin  (VITAMIN  B12) tablet 1,000 mcg, 1,000 mcg, Oral, Daily, Von Bellis, MD   enoxaparin  (LOVENOX ) injection 40 mg, 40 mg, Subcutaneous, Q24H, Von, Dileep, MD, 40 mg at 07/30/24 0941   folic acid  (FOLVITE ) tablet 1 mg, 1 mg, Oral, Daily, Von Bellis, MD, 1 mg at 07/30/24 9062   furosemide (LASIX) injection 40 mg, 40 mg, Intravenous, BID, Kataleia Quaranta, MD, 40 mg at 07/30/24 1710   guaiFENesin  (MUCINEX ) 12 hr tablet 600 mg, 600 mg, Oral, BID, Von Bellis, MD, 600  mg at 07/30/24 2136   haloperidol lactate (HALDOL) injection 2 mg, 2 mg, Intravenous, Q6H PRN, Von Bellis, MD, 2 mg at 07/24/24 0906   hydrALAZINE  (APRESOLINE ) injection 10 mg, 10 mg, Intravenous, Q6H PRN, Von Bellis, MD, 10 mg at 07/27/24 0911   HYDROcodone-acetaminophen  (NORCO/VICODIN) 5-325 MG per tablet 1 tablet, 1 tablet, Oral, Q6H PRN, Von Bellis, MD, 1 tablet at 07/31/24 0234   ipratropium-albuterol  (DUONEB) 0.5-2.5 (3) MG/3ML nebulizer solution 3 mL, 3 mL, Nebulization, Q4H PRN, Von Bellis, MD, 3 mL at 07/29/24 9047   LORazepam  (ATIVAN ) tablet 0.5 mg, 0.5 mg, Oral, BID PRN, Von Bellis, MD, 0.5 mg at 07/30/24 2039   magnesium  hydroxide (MILK OF MAGNESIA) suspension 30 mL, 30 mL, Oral, Daily PRN, Cleatus Delayne GAILS, MD, 30 mL at 07/28/24 2041   melatonin tablet 5 mg, 5 mg, Oral, QHS PRN, Cleatus Delayne GAILS, MD   multivitamin with minerals tablet 1 tablet, 1 tablet, Oral, Daily, Von Bellis, MD, 1 tablet at 07/30/24 9062   ondansetron  (ZOFRAN ) tablet 4 mg, 4 mg, Oral, Q6H PRN, 4 mg at 07/30/24 2039 **OR** ondansetron  (ZOFRAN ) injection 4 mg, 4 mg, Intravenous, Q6H PRN, Von Bellis, MD, 4 mg at 07/26/24 2032   pantoprazole  (PROTONIX ) EC tablet 40 mg, 40 mg, Oral, BID, Von Bellis, MD, 40 mg at 07/30/24 2136   polyethylene glycol (MIRALAX  / GLYCOLAX ) packet 17 g, 17 g, Oral, BID, Von Bellis, MD, 17 g at 07/30/24 9056   potassium chloride SA (KLOR-CON M) CR tablet 20 mEq, 20 mEq, Oral, BID, Patrizia Paule, MD, 20 mEq at  07/30/24 2136   rosuvastatin  (CRESTOR ) tablet 5 mg, 5 mg, Oral, QPM, Von Bellis, MD, 5 mg at 07/30/24 1710   sodium chloride  flush (NS) 0.9 % injection 3 mL, 3 mL, Intravenous, Q12H, Von Bellis, MD, 3 mL at 07/30/24 2138   sodium chloride  flush (NS) 0.9 % injection 3 mL, 3 mL, Intravenous, Q12H, Von Bellis, MD, 3 mL at 07/30/24 2138   sodium chloride  flush (NS) 0.9 % injection 3 mL, 3 mL, Intravenous, PRN, Von Bellis, MD, 3 mL at 07/25/24 2138   thiamine  (VITAMIN B1) tablet 100 mg, 100 mg, Oral, Daily, 100 mg at 07/30/24 9062 **OR** thiamine  (VITAMIN B1) injection 100 mg, 100 mg, Intravenous, Daily, Von Bellis, MD, 100 mg at 07/25/24 0831   umeclidinium bromide  (INCRUSE ELLIPTA ) 62.5 MCG/ACT 1 puff, 1 puff, Inhalation, Daily, Von Bellis, MD, 1 puff at 07/30/24 9062    ALLERGIES   Zithromax  [azithromycin ], Quetiapine , and Buspar  [buspirone ]     REVIEW OF SYSTEMS    Review of Systems:  Gen:  Denies  fever, sweats, chills weigh loss  HEENT: Denies blurred vision, double vision, ear pain, eye pain, hearing loss, nose bleeds, sore throat Cardiac:  No dizziness, chest pain or heaviness, chest tightness,edema Resp:   reports dyspnea chronically  Gi: Denies swallowing difficulty, stomach pain, nausea or vomiting, diarrhea, constipation, bowel incontinence Gu:  Denies bladder incontinence, burning urine Ext:   Denies Joint pain, stiffness or swelling Skin: Denies  skin rash, easy bruising or bleeding or hives Endoc:  Denies polyuria, polydipsia , polyphagia or weight change Psych:   Denies depression, insomnia or hallucinations   Other:  All other systems negative   VS: BP 126/62   Pulse 67   Temp 98.3 F (36.8 C) (Oral)   Resp 16   Ht 5' 5 (1.651 m)   Wt 55 kg   SpO2 100%   BMI 20.18 kg/m  PHYSICAL EXAM    GENERAL:NAD, no fevers, chills, no weakness no fatigue HEAD: Normocephalic, atraumatic.  EYES: Pupils equal, round, reactive to light.  Extraocular muscles intact. No scleral icterus.  MOUTH: Moist mucosal membrane. Dentition intact. No abscess noted.  EAR, NOSE, THROAT: Clear without exudates. No external lesions.  NECK: Supple. No thyromegaly. No nodules. No JVD.  PULMONARY: decreased breath sounds with mild rhonchi worse at bases bilaterally.  CARDIOVASCULAR: S1 and S2. Regular rate and rhythm. No murmurs, rubs, or gallops. No edema. Pedal pulses 2+ bilaterally.  GASTROINTESTINAL: Soft, nontender, nondistended. No masses. Positive bowel sounds. No hepatosplenomegaly.  MUSCULOSKELETAL: No swelling, clubbing, or edema. Range of motion full in all extremities.  NEUROLOGIC: Cranial nerves II through XII are intact. No gross focal neurological deficits. Sensation intact. Reflexes intact.  SKIN: No ulceration, lesions, rashes, or cyanosis. Skin warm and dry. Turgor intact.  PSYCHIATRIC: Mood, affect within normal limits. The patient is awake, alert and oriented x 3. Insight, judgment intact.       IMAGING   Study Result  Narrative & Impression  EXAM: CT CHEST, ABDOMEN AND PELVIS WITH CONTRAST 07/24/2024 05:48:42 AM   TECHNIQUE: CT of the chest, abdomen and pelvis was performed with the administration of intravenous contrast. Multiplanar reformatted images are provided for review. Automated exposure control, iterative reconstruction, and/or weight based adjustment of the mA/kV was utilized to reduce the radiation dose to as low as reasonably achievable.   COMPARISON: Chest CT 10/16/2023.   CLINICAL HISTORY: 63 year old female. Fall, left rib pain, abd pain, diarrhea, nausea.   FINDINGS:   CHEST:   MEDIASTINUM AND LYMPH NODES: Normal heart size with evidence of calcified coronary artery and aortic atherosclerosis. The central airways are clear. No mediastinal, hilar or axillary lymphadenopathy.   LUNGS AND PLEURA: Chronic emphysema. Large lung volumes. Trace retained secretions in the trachea. Central  airways are patent. Occasional peripheral airway opacification due to retained secretion such as in the right lower lobe on series 4 image 111. No consolidation or pleural effusion. Mild generalized bronchial wall thickening. No suspicious pulmonary nodule.   ABDOMEN AND PELVIS:   LIVER: Subcentimeter but circumscribed hypodensity in the left hepatic lobe on series 2 image 56 appears to be a small benign cyst or hemangioma (no follow up imaging recommended). Liver enhancement remains normal.   GALLBLADDER AND BILE DUCTS: Gallbladder is distended (coronal image 28). No pericholecystic inflammation, sludge, or stones evident by CT. No biliary ductal dilatation.   SPLEEN: Diminutive spleen.   PANCREAS: No acute abnormality.   ADRENAL GLANDS: No acute abnormality.   KIDNEYS, URETERS AND BLADDER: No stones in the kidneys or ureters. No hydronephrosis. No perinephric or periureteral stranding. Grossly negative urinary bladder.   GI AND BOWEL: Small gastric hiatal hernia. Motion artifact obscures detail of the distal small bowel in the pelvis and the rectosigmoid colon. Upstream large and small bowel loops are nondilated. Mildly redundant transverse colon. Cecum is on a lax mesentery. Normal gas containing appendix on coronal image 47.   REPRODUCTIVE ORGANS: Pelvis motion artifact. Obscured detail of the uterus and adnexa.   PERITONEUM AND RETROPERITONEUM: No ascites. No free air.   VASCULATURE: Advanced abdominal aortic calcified atherosclerosis continuing into the iliac arteries. Major arterial structures appear patent. Major portal venous structures appear patent.   ABDOMINAL AND PELVIS LYMPH NODES: No lymphadenopathy.   BONES AND SOFT TISSUES: Non displaced fractures of the anterior left left 5th and 6th ribs are subtle, new from the January CT. No displaced  rib fracture. No superficial soft tissue injury. Maintained vertebral height. Lumbar disc and endplate  degeneration. Pelvis motion artifact.   IMPRESSION: 1. Subtle nondisplaced fractures of the anterior left 5th and 6th ribs, new from prior chest CT. 2. Motion artifact in the pelvis.  No other acute traumatic injury identified. 3. Distended gallbladder but no CT evidence of acute cholecystitis. 4. Emphysema.  Mild airway thickening and retained secretions. 5. Coronary artery and aortic atherosclerosis.   Electronically signed by: Helayne Hurst MD 07/24/2024 06:16 AM EDT RP Workstation: [id number]     ASSESSMENT/PLAN    Acute hypoxemic respiratory failure -CT chest - no pneumonia or large aspiration   - She does have mild atelectasis and id like to try chest physiotherapy to recruit her bases and we should be able to wean her off oxygen.  She takes shallow breaths due to pain from left rib fractures.   - will order chest PT with incentive spirometry for now  - CT PE with atelectasis and effusions, continue crrent plan  Interstitial edema    Lasix 40 iv daily- improved   Alcoholism   Patient reports ongoing alcohilism and recently tried to quit with onset of withdrawal symptoms.  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  -will need CIWA protocol    Anxiety and Depression    - patient reports depression , she states she was unable to have children and has no family or support.  She drinks and smokes to keep herself occupied but admits to worsening depression and alcoholism.  She asked if someone could help her discuss these issues.  -will place [facility] consultation and consider adult mental health evaluation           Thank you for allowing me to participate in the care of this patient.   Patient/Family are satisfied with care plan and all questions have been answered.    Provider disclosure: Patient with at least one acute or chronic  illness or injury that poses a threat to life or bodily function and is being managed actively during this encounter.  All of the below services have been performed independently by signing provider:  review of prior documentation from internal and or external health records.  Review of previous and current lab results.  Interview and comprehensive assessment during patient visit today. Review of current and previous chest radiographs/CT scans. Discussion of management and test interpretation with health care team and patient/family.   This document was prepared using Dragon voice recognition software and may include unintentional dictation errors.     Katilyn Miltenberger, M.D.  Division of Pulmonary & Critical Care Medicine

## 2024-07-31 NOTE — TOC Initial Note (Signed)
 Transition of Care [facility]) - Initial/Assessment Note    Patient Details  Name: Christine Sparks MRN: [id number] Date of Birth: 08-29-1961  Transition of Care [facility]) CM/SW Contact:    Marinda Cooks, RN Phone Number: 07/31/2024, 4:08 PM  Clinical Narrative:                 This CM arrived at bedside and completed Initial assessement. Pt A&Ox4. Pt reports living  alone at address listed on demographic sheet Pt shared she  has  support provided by her friends [employer]. Pt uses [facility] & PCP is listed as Hadassah Nett. Pt  has DME at home that includes RW, Air Traffic Controller, Chair, & rollator. Pt reports not  being in r having HH set up.   DC Planning - This CM discussed dc planning recommendations with pt per PT and medical team for [facility] and DME / Home oxygen. Pt provided agency choice and confirmed she did not have a preference . This CM sent referrals to [facility] for DME. Pt also has HH secured with [facility] . TOC will cont to follow dc planning / care coordination and update as applicable.    Expected Discharge Plan and Services  Home with [facility]  Prior Living Arrangements/Services    Home     Activities of Daily Living   ADL Screening (condition at time of admission) Independently performs ADLs?: No Does the patient have a NEW difficulty with bathing/dressing/toileting/self-feeding that is expected to last >3 days?: No Does the patient have a NEW difficulty with getting in/out of bed, walking, or climbing stairs that is expected to last >3 days?: No Does the patient have a NEW difficulty with communication that is expected to last >3 days?: No Is the patient deaf or have difficulty hearing?: No Does the patient have difficulty seeing, even when wearing glasses/contacts?: No Does the patient have difficulty concentrating, remembering, or making decisions?: No  Permission Sought/Granted                  Emotional Assessment              Admission diagnosis:   Hyponatremia [E87.1] Diarrhea of infectious origin [A09] Generalized weakness [R53.1] COPD with acute exacerbation (HCC) [J44.1] Fall, initial encounter [W19.XXXA] Closed fracture of multiple ribs of left side, initial encounter [S22.42XA] Patient Active Problem List   Diagnosis Date Noted   Alcohol use disorder 04/10/2024   Hypomagnesemia 03/28/2024   Nausea vomiting and diarrhea 03/28/2024   Panic attacks 11/26/2023   Depression, major, single episode, moderate (HCC) 11/17/2023   Mixed hyperlipidemia 06/01/2023   Cigarette nicotine  dependence without complication 06/01/2023   Hyponatremia 01/10/2023   Diverticulosis 01/10/2023   Allergic rhinitis due to pollen 01/10/2023   Vaginal atrophy 07/07/2016   History of cone biopsy of cervix 07/07/2016   Uterine leiomyoma 07/07/2016   History of endometriosis 07/07/2016   Vitamin D  deficiency 12/20/2013   History of cervical cancer 02/24/2013   B12 deficiency 02/24/2013   COPD (chronic obstructive pulmonary disease) (HCC) 11/07/2011   Anxiety    Tobacco abuse    Essential hypertension, benign    Menopausal syndrome    PCP:  Nett Hadassah SQUIBB, MD Pharmacy:   [facility] [place] - [address] [place] [address] Phone: [phone] Fax: [phone]     Social Drivers of Health (SDOH) Social History: SDOH Screenings   Food Insecurity: No Food Insecurity (07/24/2024)  Housing: Low Risk  (  07/24/2024)  Transportation Needs: Unmet Transportation Needs (07/24/2024)  Utilities: Not At Risk (07/24/2024)  Alcohol Screen: Low Risk  (01/19/2024)  Depression (PHQ2-9): Medium Risk (06/13/2024)  Financial Resource Strain: Low Risk  (01/19/2024)  Physical Activity: Insufficiently Active (01/19/2024)  Social Connections: Moderately Isolated (07/24/2024)  Stress: No Stress Concern Present (01/19/2024)  Tobacco Use: High Risk (07/24/2024)  Health Literacy: Adequate Health Literacy (11/10/2023)   SDOH  Interventions:     Readmission Risk Interventions     No data to display

## 2024-07-31 NOTE — Plan of Care (Signed)

## 2024-07-31 NOTE — Progress Notes (Signed)
 Occupational Therapy Treatment Patient Details Name: Christine Sparks MRN: [id number] DOB: [birth year] Today's Date: 07/31/2024   History of present illness Pt is a 63 y/o F presenting to ED with c/o weakness, diarrhea, fall at home. CT head and neck negative, CT chest/abdomen found subtle nondisplaced fractures of anterior L 5th and 6th ribs. MD assessment includes hypotonic hyponatremia, COPD exacerbation, cervical stenosis, diarrhea of unknown cause, ETOH use disorder. PMH significant for COPD, HTN, HLD.   OT comments  Patient seen for OT treatment on this date, handoff from PT in rehab gym. Patient ambulated back to room (~120 feet) on 3L of O2 with r/w, O2 95% once in room. Patient performed therex while sitting EOB, O2 remained above 95% decreased to 1L and patient continued to sat around 95%; OT notified RN. Patient ended treatment in bed with bed alarm on and all needs within reach. Patient making good progress toward goals, will continue to follow POC. Discharge recommendation remains appropriate.        If plan is discharge home, recommend the following:  A little help with walking and/or transfers;A little help with bathing/dressing/bathroom;Help with stairs or ramp for entrance;Direct supervision/assist for medications management;Direct supervision/assist for financial management   Equipment Recommendations  BSC/3in1    Recommendations for Other Services      Precautions / Restrictions Precautions Precautions: Fall Recall of Precautions/Restrictions: Intact Restrictions Weight Bearing Restrictions Per Provider Order: No       Mobility Bed Mobility Overal bed mobility: Modified Independent Bed Mobility: Supine to Sit   Sidelying to sit: Modified independent (Device/Increase time) Supine to sit: Modified independent (Device/Increase time)          Transfers Overall transfer level: Modified independent Equipment used: None Transfers: Sit to/from Stand Sit to Stand:  Modified independent (Device/Increase time)           General transfer comment: no physical A needed     Balance           Standing balance support: During functional activity, Reliant on assistive device for balance Standing balance-Leahy Scale: Good                             ADL either performed or assessed with clinical judgement   ADL Overall ADL's : Modified independent                                       General ADL Comments: managing all parts of ADLs with increased time and increased effort, no A needed    Extremity/Trunk Assessment Upper Extremity Assessment Upper Extremity Assessment:  (pain with full ROM due to rib fx)   Lower Extremity Assessment Lower Extremity Assessment: Defer to PT evaluation        Vision       Perception     Praxis     Communication Communication Communication: No apparent difficulties   Cognition Arousal: Alert Behavior During Therapy: WFL for tasks assessed/performed Cognition: No apparent impairments                               Following commands: Intact        Cueing   Cueing Techniques: Verbal cues  Exercises Exercises: Other exercises Other Exercises Other Exercises: 10 reps of UB mobility for neck flexion/extension, lateral rotation,  AAROM shoulder flexion in semi fowlers position, horizontal abduction/adduction and arm circles with 0 lb dowel rod, patient reports no increase in pain, O2 remained 95% and above    Shoulder Instructions       General Comments      Pertinent Vitals/ Pain       Pain Assessment Pain Assessment: No/denies pain Pain Score: 6  Pain Location: L side flank/rib pain, after ambulation Pain Descriptors / Indicators: Aching, Grimacing, Sharp Pain Intervention(s): Monitored during session  Home Living                                          Prior Functioning/Environment              Frequency  Min 2X/week         Progress Toward Goals  OT Goals(current goals can now be found in the care plan section)  Progress towards OT goals: Progressing toward goals  Acute Rehab OT Goals Patient Stated Goal: to go home OT Goal Formulation: With patient Time For Goal Achievement: 08/08/24 Potential to Achieve Goals: Good ADL Goals Pt Will Perform Grooming: with modified independence;sitting Pt Will Perform Lower Body Dressing: with modified independence;sit to/from stand;sitting/lateral leans Pt Will Transfer to Toilet: with modified independence;ambulating Pt Will Perform Toileting - Clothing Manipulation and hygiene: with modified independence;sit to/from stand  Plan      Co-evaluation                 AM-PAC OT 6 Clicks Daily Activity     Outcome Measure   Help from another person eating meals?: None Help from another person taking care of personal grooming?: None Help from another person toileting, which includes using toliet, bedpan, or urinal?: None Help from another person bathing (including washing, rinsing, drying)?: None Help from another person to put on and taking off regular upper body clothing?: None Help from another person to put on and taking off regular lower body clothing?: None 6 Click Score: 24    End of Session Equipment Utilized During Treatment: Oxygen  OT Visit Diagnosis: Other abnormalities of gait and mobility (R26.89);Muscle weakness (generalized) (M62.81)   Activity Tolerance Patient tolerated treatment well   Patient Left in bed;with call bell/phone within reach   Nurse Communication Other (comment) (O2 requirement)        Time: 8685-8668 OT Time Calculation (min): 17 min  Charges: OT General Charges $OT Visit: 1 Visit OT Treatments $Therapeutic Exercise: 8-22 mins  Rogers Clause, OT/L MSOT, 07/31/2024

## 2024-08-01 ENCOUNTER — Other Ambulatory Visit: Payer: Self-pay

## 2024-08-01 ENCOUNTER — Telehealth: Payer: Self-pay

## 2024-08-01 DIAGNOSIS — E871 Hypo-osmolality and hyponatremia: Secondary | ICD-10-CM | POA: Diagnosis not present

## 2024-08-01 LAB — MAGNESIUM: Magnesium: 1.9 mg/dL (ref 1.7–2.4)

## 2024-08-01 LAB — CBC
HCT: 32.9 % — ABNORMAL LOW (ref 36.0–46.0)
Hemoglobin: 11.4 g/dL — ABNORMAL LOW (ref 12.0–15.0)
MCH: 32.4 pg (ref 26.0–34.0)
MCHC: 34.7 g/dL (ref 30.0–36.0)
MCV: 93.5 fL (ref 80.0–100.0)
Platelets: 400 K/uL (ref 150–400)
RBC: 3.52 MIL/uL — ABNORMAL LOW (ref 3.87–5.11)
RDW: 12.7 % (ref 11.5–15.5)
WBC: 8.4 K/uL (ref 4.0–10.5)
nRBC: 0 % (ref 0.0–0.2)

## 2024-08-01 LAB — BASIC METABOLIC PANEL WITH GFR
Anion gap: 10 (ref 5–15)
BUN: 9 mg/dL (ref 8–23)
CO2: 33 mmol/L — ABNORMAL HIGH (ref 22–32)
Calcium: 8.5 mg/dL — ABNORMAL LOW (ref 8.9–10.3)
Chloride: 87 mmol/L — ABNORMAL LOW (ref 98–111)
Creatinine, Ser: 0.64 mg/dL (ref 0.44–1.00)
GFR, Estimated: >60 mL/min (ref >60)
Glucose, Bld: 84 mg/dL (ref 70–99)
Potassium: 4.2 mmol/L (ref 3.5–5.1)
Sodium: 130 mmol/L — ABNORMAL LOW (ref 135–145)

## 2024-08-01 LAB — PHOSPHORUS: Phosphorus: 4.5 mg/dL (ref 2.5–4.6)

## 2024-08-01 MED ORDER — AMLODIPINE BESYLATE 10 MG PO TABS
10.0000 mg | ORAL_TABLET | Freq: Every day | ORAL | 0 refills | Status: DC
  Filled 2024-08-01: qty 30, 30d supply, fill #0

## 2024-08-01 MED ORDER — TRAZODONE HCL 50 MG PO TABS
50.0000 mg | ORAL_TABLET | Freq: Every day | ORAL | 1 refills | Status: DC
  Filled 2024-08-01: qty 30, 30d supply, fill #0

## 2024-08-01 MED ORDER — VITAMIN B-1 100 MG PO TABS
100.0000 mg | ORAL_TABLET | Freq: Every day | ORAL | 0 refills | Status: DC
  Filled 2024-08-01: qty 30, 30d supply, fill #0

## 2024-08-01 MED ORDER — OXYCODONE-ACETAMINOPHEN 5-325 MG PO TABS
1.0000 | ORAL_TABLET | Freq: Three times a day (TID) | ORAL | 0 refills | Status: DC | PRN
  Filled 2024-08-01: qty 5, 2d supply, fill #0

## 2024-08-01 MED ORDER — PANTOPRAZOLE SODIUM 40 MG PO TBEC
40.0000 mg | DELAYED_RELEASE_TABLET | Freq: Every day | ORAL | 0 refills | Status: DC
  Filled 2024-08-01: qty 30, 30d supply, fill #0

## 2024-08-01 MED ORDER — FUROSEMIDE 40 MG PO TABS
40.0000 mg | ORAL_TABLET | Freq: Two times a day (BID) | ORAL | 0 refills | Status: DC
  Filled 2024-08-01: qty 60, 30d supply, fill #0

## 2024-08-01 MED ORDER — ADULT MULTIVITAMIN W/MINERALS CH
1.0000 | ORAL_TABLET | Freq: Every day | ORAL | 0 refills | Status: AC
  Filled 2024-08-01: qty 30, 30d supply, fill #0

## 2024-08-01 MED ORDER — OXYCODONE-ACETAMINOPHEN 5-325 MG PO TABS: 1.0000 | ORAL_TABLET | Freq: Three times a day (TID) | ORAL | 0 refills | Status: DC | PRN

## 2024-08-01 MED ORDER — FOLIC ACID 1 MG PO TABS
1.0000 mg | ORAL_TABLET | Freq: Every day | ORAL | 0 refills | Status: DC
  Filled 2024-08-01: qty 30, 30d supply, fill #0

## 2024-08-01 MED ORDER — INFLUENZA VAC SPLIT HIGH-DOSE 0.5 ML IM SUSY
0.5000 mL | PREFILLED_SYRINGE | INTRAMUSCULAR | Status: DC
Start: 2024-08-02 — End: 2024-08-01

## 2024-08-01 MED ORDER — POTASSIUM CHLORIDE CRYS ER 20 MEQ PO TBCR
20.0000 meq | EXTENDED_RELEASE_TABLET | Freq: Two times a day (BID) | ORAL | 0 refills | Status: DC
  Filled 2024-08-01: qty 60, 30d supply, fill #0

## 2024-08-01 MED ORDER — NICOTINE 7 MG/24HR TD PT24
7.0000 mg | MEDICATED_PATCH | Freq: Every day | TRANSDERMAL | 0 refills | Status: DC
Start: 2024-08-01 — End: 2024-08-20
  Filled 2024-08-01: qty 28, 28d supply, fill #0

## 2024-08-01 MED ORDER — ROSUVASTATIN CALCIUM 5 MG PO TABS
5.0000 mg | ORAL_TABLET | Freq: Every day | ORAL | 0 refills | Status: DC
  Filled 2024-08-01: qty 30, 30d supply, fill #0

## 2024-08-01 MED ORDER — CYANOCOBALAMIN 1000 MCG PO TABS
1000.0000 ug | ORAL_TABLET | Freq: Every day | ORAL | 0 refills | Status: DC
  Filled 2024-08-01: qty 30, 30d supply, fill #0

## 2024-08-01 NOTE — Discharge Summary (Signed)
 Physician Discharge Summary   Patient: Christine Sparks MRN: [id number]  DOB: Sep 01, 1961   Admit:     Date of Admission: 07/24/2024 Admitted from: home   Discharge: Date of discharge: 08/01/24 Disposition: Home health Condition at discharge: good  CODE STATUS: FULL CODE     Discharge Physician: Laneta Blunt, DO [facility]     PCP: Herold Hadassah SQUIBB, MD  Recommendations for Outpatient Follow-up:  Follow up with PCP Herold Hadassah SQUIBB, MD in 1-2 weeks    Discharge Instructions     Diet general   Complete by: As directed    Increase activity slowly   Complete by: As directed          Discharge Diagnoses: Principal Problem:   Hyponatremia      Hospital course / significant events:   HPI:   10/22: admitted to hospitalist, sodium 122 likely d/t GI losses. Acute hypx resp fail likely COPD. Imaging concerning for rib fx, emphysema 10/23: COPD worsening, added nebs/steroids 10/24: EtOH withdrawals, started on CIWA 10/25-10/27: continue diuresing pulm edema, slow weaning O2, CIWA protocol. CT chest no new concerns 10/28: diuresing, wean O2 as able  10/29: Echo pending. Ordered home health PT/OT, DME incl BSC and O2  10/30: Echo preserved EF, indeterminate diast fxn, normal valve fxn. Suspect probably some diastolic dysfunction predisposing to fluid overload - baseline lower sodium also concerning for EtOH use   Consultants:  none  Procedures/Surgeries: none      ASSESSMENT & PLAN:   Hypotonic hyponatremia - improved but suspect chronic issue Concern for GI losses w/ N/V/D prior to admission Presented with generalized weakness and fall w/ Na 122 Na improved to 130-129, question EtOH component / SIADH w/ lung disease, fluid overload ikely new dx CHF (Echo pending)   fluid restriction  Salt tablets have been dc Lasix for HFpEF   Acute hypoxic respiratory failure  due to COPD exacerbation Follow pulmonary outpatient  Continue O2 on  discharge   Pulmonary edema S/p diuresis  Echo preserved EF, indeterminate diast fxn, normal valve fxn. Suspect probably some diastolic dysfunction predisposing to fluid overload  Lasix transition to po  Follow outpatient w/ PCP/cardiology    EtOH use disorder Patient drinks 5 to 6 glasses of wine daily - concern w/ intake + benzo use  Started multivitamin, folate and thiamine  10/24 started Valium  tapering dose for detox as patient was actively withdrawing   Benzodiazepine use / question dependence  counseling done - pt has been on lorazepam  0.5 mg bid since filled 08/11, prior to that PDMP indicates fills for daily dosing. Last fill was 30 days supply on 10/01 so she should not have been out before 10/30 she says she has some at home, advised follow w/ PCP   Hypokalemia Hypophosphatemia Replace as needed Monitor BMP  Left 5th -- 8th Ribs fracture, nondisplaced. S/p fall at home secondary to dehydration due to diarrhea Continue as needed meds for pain control   Cervical stenosis Continue as needed medication for pain control Recommend to follow with PCP and spine surgery as an outpatient  Limited Rx oxy-APAP on discharge    Diarrhea, unknown cause. Resolved  Last episode was 10/21 before admission Treat prn    Nicotine  dependence,  patient smokes cigarettes off-and-on to quit. Use nicotine  patch / gum on discharge  Smoking cessation counseling done.   Hypertension Resume lisinopril  10 mg p.o. daily home dose 10/25 started amlodipine 10 mg p.o. daily Also on lasix as above  Monitor BP outpatient and titrate medication accordingly   Depression and anxiety Ativan  as needed home dose Patient used to take Celexa  but not taking anymore. Follow outpatient  pt sates she drinks when she is anxious, she requests for Ativan  on discharge which helps her anxiety a lot, and wants to follow up to address her anxiety outpatient - I educated re: benzodiazepine mechanism and  dependence, discussed that outpatient provider(s) may decide NOT to continue this medication but they should be offering a plan to help her anxiety and her alcohol use. I advised NO alcohol intake while on these medications. Will consider rx on discharge    Vitamin B12 level 221, goal >400.    Started vitamin B12 1000 mcg IM injection daily during hospital stay, followed by oral supplement.   Follow-up PCP to repeat vitamin B12 level after 3 to 6 months.    No concerns based on BMI: Body mass index is 20.18 kg/m.[name] Significantly low or high BMI is associated with higher medical risk.  Underweight - under 18  overweight - 25 to 29 obese - 30 or more Class 1 obesity: BMI of 30.0 to 34 Class 2 obesity: BMI of 35.0 to 39 Class 3 obesity: BMI of 40.0 to 49 Super Morbid Obesity: BMI 50-59 Super-super Morbid Obesity: BMI 60+ Healthy nutrition and physical activity advised as adjunct to other disease management and risk reduction treatments           Discharge Instructions  Allergies as of 08/01/2024       Reactions   Zithromax  [azithromycin ] Nausea And Vomiting   Quetiapine  Other (See Comments)   nightmares   Buspar  [buspirone ] Other (See Comments)   Made her feel crazy        Medication List     STOP taking these medications    ondansetron  4 MG disintegrating tablet Commonly known as: ZOFRAN -ODT   ramipril  10 MG capsule Commonly known as: ALTACE        TAKE these medications    albuterol  108 (90 Base) MCG/ACT inhaler Commonly known as: VENTOLIN  HFA Inhale 2 puffs into the lungs every 6 (six) hours as needed for shortness of breath.   amLODipine 10 MG tablet Commonly known as: NORVASC Take 1 tablet (10 mg total) by mouth daily.   CertaVite/Antioxidants Tabs Take 1 tablet by mouth daily.   citalopram  40 MG tablet Commonly known as: CELEXA  Take 1 tablet (40 mg total) by mouth daily.   cyanocobalamin  1000 MCG tablet Commonly known as: VITAMIN B12 Take  1 tablet (1,000 mcg total) by mouth daily.   famotidine  20 MG tablet Commonly known as: Pepcid  Take 1 tablet (20 mg total) by mouth 2 (two) times daily.   folic acid  1 MG tablet Commonly known as: FOLVITE  Take 1 tablet (1 mg total) by mouth daily.   furosemide 40 MG tablet Commonly known as: LASIX Take 1 tablet (40 mg total) by mouth 2 (two) times daily.   ipratropium-albuterol  0.5-2.5 (3) MG/3ML Soln Commonly known as: DUONEB Take 3 mLs by nebulization every 6 (six) hours as needed.   LORazepam  0.5 MG tablet Commonly known as: ATIVAN  Take 1 tablet (0.5 mg total) by mouth 2 (two) times daily as needed. for anxiety   nicotine  7 mg/24hr patch Commonly known as: Nicotine  Step 3 Place 1 patch (7 mg total) onto the skin daily. What changed:  medication strength See the new instructions.   oxyCODONE -acetaminophen  5-325 MG tablet Commonly known as: PERCOCET/ROXICET Take 1 tablet by mouth every 8 (  eight) hours as needed for severe pain (pain score 7-10).   pantoprazole  40 MG tablet Commonly known as: PROTONIX  Take 1 tablet (40 mg total) by mouth daily.   potassium chloride SA 20 MEQ tablet Commonly known as: KLOR-CON M Take 1 tablet (20 mEq total) by mouth 2 (two) times daily.   rosuvastatin  5 MG tablet Commonly known as: CRESTOR  Take 1 tablet (5 mg total) by mouth daily.   thiamine  100 MG tablet Commonly known as: VITAMIN B1 Take 1 tablet (100 mg total) by mouth daily.   traZODone 50 MG tablet Commonly known as: DESYREL Take 1 tablet (50 mg total) by mouth at bedtime.   Trelegy Ellipta  100-62.5-25 MCG/ACT Aepb Generic drug: Fluticasone -Umeclidin-Vilant Inhale 1 puff into the lungs daily.               Durable Medical Equipment  (From admission, onward)           Start     Ordered   07/31/24 1416  DME 3-in-1  Once        07/31/24 1415   07/31/24 1416  For home use only DME oxygen  Once       Question Answer Comment  Length of Need 12 Months   Mode  or (Route) Nasal cannula   Liters per Minute 2   Oxygen delivery system: Gas      07/31/24 1415   07/30/24 1233  For home use only DME oxygen  Once       Question Answer Comment  Length of Need 6 Months   Mode or (Route) Nasal cannula   Liters per Minute 2   Frequency Continuous (stationary and portable oxygen unit needed)   Oxygen conserving device Yes   Oxygen delivery system: Gas   Oxygen delivery system: Concentrator      07/30/24 1233             Contact information for follow-up providers     Herold Hadassah SQUIBB, MD. Schedule an appointment as soon as possible for a visit on 08/02/2024.   Specialty: Family Medicine Why: hosp[ital follow up - recheck labs (low sodium) and follow up on prescriptions (Ativan ) and monitor alcohol use / abstinence;  Appt @ 3:20 pm Contact information: [address] [place] [address] [phone]              Contact information for after-discharge care     Home Medical Care     [facility]) .   Service: Home Health Services Contact information: [address] [place]  [phone]                     Allergies  Allergen Reactions   Zithromax  [Azithromycin ] Nausea And Vomiting   Quetiapine  Other (See Comments)    nightmares   Buspar  [Buspirone ] Other (See Comments)    Made her feel crazy     Subjective: pt states feeling well today, no concerns for discharge, no CP/SOB, ambulating independently, toelrating diet    Discharge Exam: BP 123/66 (BP Location: Right Arm)   Pulse 73   Temp 97.8 F (36.6 C)   Resp 17   Ht 5' 5 (1.651 m)   Wt 55 kg   SpO2 97%   BMI 20.18 kg/m  General: Pt is alert, awake, not in acute distress Cardiovascular: RRR, S1/S2 +, no rubs, no gallops Respiratory: CTA bilaterally, no wheezing, no rhonchi Abdominal: Soft, NT, ND, bowel sounds +  Extremities: no edema, no cyanosis     The results of significant diagnostics from this  hospitalization (including imaging, microbiology, ancillary and laboratory) are listed below for reference.     Microbiology: Recent Results (from the past 240 hours)  Resp panel by RT-PCR (RSV, Flu A&B, Covid) Anterior Nasal Swab     Status: None   Collection Time: 07/24/24  4:29 AM   Specimen: Anterior Nasal Swab  Result Value Ref Range Status   SARS Coronavirus 2 by RT PCR NEGATIVE NEGATIVE Final    Comment: (NOTE) SARS-CoV-2 target nucleic acids are NOT DETECTED.  The SARS-CoV-2 RNA is generally detectable in upper respiratory specimens during the acute phase of infection. The lowest concentration of SARS-CoV-2 viral copies this assay can detect is 138 copies/mL. A negative result does not preclude SARS-Cov-2 infection and should not be used as the sole basis for treatment or other patient management decisions. A negative result may occur with  improper specimen collection/handling, submission of specimen other than nasopharyngeal swab, presence of viral mutation(s) within the areas targeted by this assay, and inadequate number of viral copies(<138 copies/mL). A negative result must be combined with clinical observations, patient history, and epidemiological information. The expected result is Negative.  Fact Sheet for Patients:  bloggercourse.com  Fact Sheet for Healthcare Providers:  seriousbroker.it  This test is no t yet approved or cleared by the [place]  FDA and  has been authorized for detection and/or diagnosis of SARS-CoV-2 by FDA under an Emergency Use Authorization (EUA). This EUA will remain  in effect (meaning this test can be used) for the duration of the COVID-19 declaration under Section 564(b)(1) of the Act, 21 U.S.C.section 360bbb-3(b)(1), unless the authorization is terminated  or revoked sooner.       Influenza A by PCR NEGATIVE NEGATIVE Final   Influenza B by PCR NEGATIVE NEGATIVE Final     Comment: (NOTE) The Xpert Xpress SARS-CoV-2/FLU/RSV plus assay is intended as an aid in the diagnosis of influenza from Nasopharyngeal swab specimens and should not be used as a sole basis for treatment. Nasal washings and aspirates are unacceptable for Xpert Xpress SARS-CoV-2/FLU/RSV testing.  Fact Sheet for Patients: bloggercourse.com  Fact Sheet for Healthcare Providers: seriousbroker.it  This test is not yet approved or cleared by the [place]  FDA and has been authorized for detection and/or diagnosis of SARS-CoV-2 by FDA under an Emergency Use Authorization (EUA). This EUA will remain in effect (meaning this test can be used) for the duration of the COVID-19 declaration under Section 564(b)(1) of the Act, 21 U.S.C. section 360bbb-3(b)(1), unless the authorization is terminated or revoked.     Resp Syncytial Virus by PCR NEGATIVE NEGATIVE Final    Comment: (NOTE) Fact Sheet for Patients: bloggercourse.com  Fact Sheet for Healthcare Providers: seriousbroker.it  This test is not yet approved or cleared by the [place]  FDA and has been authorized for detection and/or diagnosis of SARS-CoV-2 by FDA under an Emergency Use Authorization (EUA). This EUA will remain in effect (meaning this test can be used) for the duration of the COVID-19 declaration under Section 564(b)(1) of the Act, 21 U.S.C. section 360bbb-3(b)(1), unless the authorization is terminated or revoked.  Performed at [facility], [address] [place], [place] [address]   Respiratory (~20 pathogens) panel by PCR     Status: None   Collection Time: 07/27/24 11:25 AM   Specimen: Nasopharyngeal Swab; Respiratory  Result Value Ref Range Status   Adenovirus NOT DETECTED NOT DETECTED Final  Coronavirus 229E NOT DETECTED NOT DETECTED Final    Comment: (NOTE) The Coronavirus on the Respiratory  Panel, DOES NOT test for the novel  Coronavirus (2019 nCoV)    Coronavirus HKU1 NOT DETECTED NOT DETECTED Final   Coronavirus NL63 NOT DETECTED NOT DETECTED Final   Coronavirus OC43 NOT DETECTED NOT DETECTED Final   Metapneumovirus NOT DETECTED NOT DETECTED Final   Rhinovirus / Enterovirus NOT DETECTED NOT DETECTED Final   Influenza A NOT DETECTED NOT DETECTED Final   Influenza B NOT DETECTED NOT DETECTED Final   Parainfluenza Virus 1 NOT DETECTED NOT DETECTED Final   Parainfluenza Virus 2 NOT DETECTED NOT DETECTED Final   Parainfluenza Virus 3 NOT DETECTED NOT DETECTED Final   Parainfluenza Virus 4 NOT DETECTED NOT DETECTED Final   Respiratory Syncytial Virus NOT DETECTED NOT DETECTED Final   Bordetella pertussis NOT DETECTED NOT DETECTED Final   Bordetella Parapertussis NOT DETECTED NOT DETECTED Final   Chlamydophila pneumoniae NOT DETECTED NOT DETECTED Final   Mycoplasma pneumoniae NOT DETECTED NOT DETECTED Final    Comment: Performed at [facility], [address], [place], [place] [address]     Labs: BNP (last 3 results) Recent Labs    04/03/24 1200 04/11/24 1054 07/30/24 1330  BNP 231.0* 82.6 94.9   Basic Metabolic Panel: Recent Labs  Lab 07/27/24 0326 07/28/24 0428 07/29/24 0525 07/30/24 0438 07/31/24 0339 08/01/24 0320  NA 128* 130* 132* 131* 129* 130*  K 4.2 3.8 3.1* 3.9 4.1 4.2  CL 96* 98 94* 92* 84* 87*  CO2 23 24 30  33* 35* 33*  GLUCOSE 102* 119* 95 88 87 84  BUN 11 11 9 8 9 9   CREATININE 0.64 0.39* 0.38* 0.38* 0.54 0.64  CALCIUM  8.9 8.5* 8.8* 8.4* 8.3* 8.5*  MG 2.1  --  2.2 1.9 1.9 1.9  PHOS 3.9  --  2.5 2.3* 4.4 4.5   Liver Function Tests: Recent Labs  Lab 07/26/24 0429  AST 29  ALT 26  ALKPHOS 66  BILITOT 0.7  PROT 5.6*  ALBUMIN 3.2*   No results for input(s): LIPASE, AMYLASE in the last 168 hours. Recent Labs  Lab 07/26/24 0429  AMMONIA <13   CBC: Recent Labs  Lab 07/28/24 0428 07/29/24 0525 07/30/24 0438  07/31/24 0339 08/01/24 0320  WBC 10.0 10.7* 8.8 7.9 8.4  HGB 10.9* 11.2* 10.7* 10.9* 11.4*  HCT 30.9* 32.4* 30.4* 31.8* 32.9*  MCV 93.6 93.6 93.0 94.1 93.5  PLT 320 314 321 344 400   Cardiac Enzymes: No results for input(s): CKTOTAL, CKMB, CKMBINDEX, TROPONINI in the last 168 hours. BNP: Invalid input(s): POCBNP CBG: No results for input(s): GLUCAP in the last 168 hours. D-Dimer No results for input(s): DDIMER in the last 72 hours. Hgb A1c No results for input(s): HGBA1C in the last 72 hours. Lipid Profile No results for input(s): CHOL, HDL, LDLCALC, TRIG, CHOLHDL, LDLDIRECT in the last 72 hours. Thyroid  function studies No results for input(s): TSH, T4TOTAL, T3FREE, THYROIDAB in the last 72 hours.  Invalid input(s): FREET3 Anemia work up No results for input(s): VITAMINB12, FOLATE, FERRITIN, TIBC, IRON, RETICCTPCT in the last 72 hours. Urinalysis    Component Value Date/Time   COLORURINE YELLOW (A) 07/24/2024 1600   APPEARANCEUR CLEAR (A) 07/24/2024 1600   LABSPEC 1.041 (H) 07/24/2024 1600   PHURINE 6.0 07/24/2024 1600   GLUCOSEU NEGATIVE 07/24/2024 1600   HGBUR SMALL (A) 07/24/2024 1600   BILIRUBINUR NEGATIVE 07/24/2024 1600   KETONESUR NEGATIVE 07/24/2024 1600  PROTEINUR NEGATIVE 07/24/2024 1600   NITRITE NEGATIVE 07/24/2024 1600   LEUKOCYTESUR NEGATIVE 07/24/2024 1600   Sepsis Labs Recent Labs  Lab 07/29/24 0525 07/30/24 0438 07/31/24 0339 08/01/24 0320  WBC 10.7* 8.8 7.9 8.4   Microbiology Recent Results (from the past 240 hours)  Resp panel by RT-PCR (RSV, Flu A&B, Covid) Anterior Nasal Swab     Status: None   Collection Time: 07/24/24  4:29 AM   Specimen: Anterior Nasal Swab  Result Value Ref Range Status   SARS Coronavirus 2 by RT PCR NEGATIVE NEGATIVE Final    Comment: (NOTE) SARS-CoV-2 target nucleic acids are NOT DETECTED.  The SARS-CoV-2 RNA is generally detectable in upper  respiratory specimens during the acute phase of infection. The lowest concentration of SARS-CoV-2 viral copies this assay can detect is 138 copies/mL. A negative result does not preclude SARS-Cov-2 infection and should not be used as the sole basis for treatment or other patient management decisions. A negative result may occur with  improper specimen collection/handling, submission of specimen other than nasopharyngeal swab, presence of viral mutation(s) within the areas targeted by this assay, and inadequate number of viral copies(<138 copies/mL). A negative result must be combined with clinical observations, patient history, and epidemiological information. The expected result is Negative.  Fact Sheet for Patients:  bloggercourse.com  Fact Sheet for Healthcare Providers:  seriousbroker.it  This test is no t yet approved or cleared by the [place]  FDA and  has been authorized for detection and/or diagnosis of SARS-CoV-2 by FDA under an Emergency Use Authorization (EUA). This EUA will remain  in effect (meaning this test can be used) for the duration of the COVID-19 declaration under Section 564(b)(1) of the Act, 21 U.S.C.section 360bbb-3(b)(1), unless the authorization is terminated  or revoked sooner.       Influenza A by PCR NEGATIVE NEGATIVE Final   Influenza B by PCR NEGATIVE NEGATIVE Final    Comment: (NOTE) The Xpert Xpress SARS-CoV-2/FLU/RSV plus assay is intended as an aid in the diagnosis of influenza from Nasopharyngeal swab specimens and should not be used as a sole basis for treatment. Nasal washings and aspirates are unacceptable for Xpert Xpress SARS-CoV-2/FLU/RSV testing.  Fact Sheet for Patients: bloggercourse.com  Fact Sheet for Healthcare Providers: seriousbroker.it  This test is not yet approved or cleared by the [place]  FDA and has been  authorized for detection and/or diagnosis of SARS-CoV-2 by FDA under an Emergency Use Authorization (EUA). This EUA will remain in effect (meaning this test can be used) for the duration of the COVID-19 declaration under Section 564(b)(1) of the Act, 21 U.S.C. section 360bbb-3(b)(1), unless the authorization is terminated or revoked.     Resp Syncytial Virus by PCR NEGATIVE NEGATIVE Final    Comment: (NOTE) Fact Sheet for Patients: bloggercourse.com  Fact Sheet for Healthcare Providers: seriousbroker.it  This test is not yet approved or cleared by the [place]  FDA and has been authorized for detection and/or diagnosis of SARS-CoV-2 by FDA under an Emergency Use Authorization (EUA). This EUA will remain in effect (meaning this test can be used) for the duration of the COVID-19 declaration under Section 564(b)(1) of the Act, 21 U.S.C. section 360bbb-3(b)(1), unless the authorization is terminated or revoked.  Performed at [facility], [address] [place], [place] [address]   Respiratory (~20 pathogens) panel by PCR     Status: None   Collection Time: 07/27/24 11:25 AM   Specimen: Nasopharyngeal Swab; Respiratory  Result Value Ref Range Status  Adenovirus NOT DETECTED NOT DETECTED Final   Coronavirus 229E NOT DETECTED NOT DETECTED Final    Comment: (NOTE) The Coronavirus on the Respiratory Panel, DOES NOT test for the novel  Coronavirus (2019 nCoV)    Coronavirus HKU1 NOT DETECTED NOT DETECTED Final   Coronavirus NL63 NOT DETECTED NOT DETECTED Final   Coronavirus OC43 NOT DETECTED NOT DETECTED Final   Metapneumovirus NOT DETECTED NOT DETECTED Final   Rhinovirus / Enterovirus NOT DETECTED NOT DETECTED Final   Influenza A NOT DETECTED NOT DETECTED Final   Influenza B NOT DETECTED NOT DETECTED Final   Parainfluenza Virus 1 NOT DETECTED NOT DETECTED Final   Parainfluenza Virus 2 NOT DETECTED NOT DETECTED Final    Parainfluenza Virus 3 NOT DETECTED NOT DETECTED Final   Parainfluenza Virus 4 NOT DETECTED NOT DETECTED Final   Respiratory Syncytial Virus NOT DETECTED NOT DETECTED Final   Bordetella pertussis NOT DETECTED NOT DETECTED Final   Bordetella Parapertussis NOT DETECTED NOT DETECTED Final   Chlamydophila pneumoniae NOT DETECTED NOT DETECTED Final   Mycoplasma pneumoniae NOT DETECTED NOT DETECTED Final    Comment: Performed at [facility], [address], [place], [place] [address]   Imaging ECHOCARDIOGRAM COMPLETE Result Date: 07/31/2024    ECHOCARDIOGRAM REPORT   Patient Name:   SYEDA PRICKETT Date of Exam: 07/31/2024 Medical Rec #:  [id number]     Height:       65.0 in Accession #:    [id number]    Weight:       121.3 lb Date of Birth:  1961-05-27     BSA:          1.599 m Patient Age:    63 years      BP:           126/62 mmHg Patient Gender: F             HR:           67 bpm. Exam Location:  [facility] Procedure: 2D Echo, Cardiac Doppler and Color Doppler (Both Spectral and Color            Flow Doppler were utilized during procedure). Indications:     CHF-acute diastolic I50.31  History:         Patient has prior history of Echocardiogram examinations, most                  recent 09/20/2023. COPD. Tobacco abuse.  Sonographer:     Christopher Furnace Referring Phys:  [id number] ELVAN SOR Diagnosing Phys: Evalene Lunger MD  Sonographer Comments: Technically challenging study due to limited acoustic windows, no parasternal window and no apical window. Image acquisition challenging due to COPD and Subcostal was the only obtainable view. IMPRESSIONS  1. Left ventricular ejection fraction, by estimation, is 60 to 65%. The left ventricle has normal function. The left ventricle has no regional wall motion abnormalities. Left ventricular diastolic parameters are indeterminate.  2. Right ventricular systolic function is normal. The right ventricular size is normal.  3. The mitral valve is normal in structure. Mild  mitral valve regurgitation. No evidence of mitral stenosis.  4. The aortic valve is normal in structure. Aortic valve regurgitation is not visualized. No aortic stenosis is present.  5. The inferior vena cava is normal in size with greater than 50% respiratory variability, suggesting right atrial pressure of 3 mmHg. FINDINGS  Left Ventricle: Left ventricular ejection fraction, by estimation, is 60 to 65%. The left ventricle has normal  function. The left ventricle has no regional wall motion abnormalities. Strain was performed and the global longitudinal strain is indeterminate. The left ventricular internal cavity size was normal in size. There is no left ventricular hypertrophy. Left ventricular diastolic parameters are indeterminate. Right Ventricle: The right ventricular size is normal. No increase in right ventricular wall thickness. Right ventricular systolic function is normal. Left Atrium: Left atrial size was normal in size. Right Atrium: Right atrial size was normal in size. Pericardium: There is no evidence of pericardial effusion. Mitral Valve: The mitral valve is normal in structure. Mild mitral valve regurgitation. No evidence of mitral valve stenosis. Tricuspid Valve: The tricuspid valve is normal in structure. Tricuspid valve regurgitation is not demonstrated. No evidence of tricuspid stenosis. Aortic Valve: The aortic valve is normal in structure. Aortic valve regurgitation is not visualized. No aortic stenosis is present. Pulmonic Valve: The pulmonic valve was normal in structure. Pulmonic valve regurgitation is not visualized. No evidence of pulmonic stenosis. Aorta: The aortic root is normal in size and structure. Venous: The inferior vena cava is normal in size with greater than 50% respiratory variability, suggesting right atrial pressure of 3 mmHg. IAS/Shunts: No atrial level shunt detected by color flow Doppler. Additional Comments: 3D was performed not requiring image post processing on an  independent workstation and was indeterminate.  LEFT VENTRICLE PLAX 2D LVIDd:         2.43 cm LVIDs:         1.63 cm LV PW:         1.26 cm LV IVS:        0.98 cm  Evalene Lunger MD Electronically signed by Evalene Lunger MD Signature Date/Time: 07/31/2024/11:40:31 AM    Final       Time coordinating discharge: over 30 minutes  SIGNED:  Laneta Blunt DO [facility]

## 2024-08-01 NOTE — Progress Notes (Signed)
 PULMONOLOGY         Date: 08/01/2024,   MRN# [id number] Christine Sparks 04-Nov-1960     AdmissionWeight: 55 kg                 CurrentWeight: 55 kg  Referring provider: Dr Von   CHIEF COMPLAINT:   Chest pain and wheezing   HISTORY OF PRESENT ILLNESS     63 y.o. female with PMH of alcoholism, smoking COPD HTN, HLD who reports having diarreah for few days and fell with resultant trauma to thorax and had left rib fractures.  She was sent home on oxygen in Dec 2024 and was weaned off oxygen after few week.  She reports having withrdawal symptoms from alcoholism this week.   She denies ever having seizures. She is currently on 3L/min and reports chest discomfort but is oriented x 3 and is non labored with breathing while giving interview. Imaging showed Trace retained secretions in the trachea. Central airways are patent. Occasional peripheral airway opacification due to retained secretion such as in the right lower lobe.  Patient admits to choking on food and aspiration sometimes but seldom. pCCM consultation for further evaluation and management regarding hypemia, chest discomfort and possible PE.   07/28/24- patient seen at bedside.  Reports breathing is better but her O2 has not been weaned down.  She is able to take deep breath up to with incentive spirometer.  She should be getting close to baseline with resp status.  Today I hear mild pan inspiratory crepitations on ausclutation likely due to interstitial edema and have ordered lasix 40 iv daily.  Her renal function is normal.  She is being treated with benzos and antipsychotics and feels out of it but admits her withdrawal symptoms are resolved.   07/29/24- patient is on 2L/min.  She reports ongoing chest discomfort.  I met with RT Larry Sloate and he is attempting to wean patient off oxygen.  We will obtain CTPE today due to report of dyspnea and chest discomfort with hypoxemia and elevated ddimer  07/30/24-  patient is improved, she's on minimal O2 which I think can be removed at this time.  Her CTPE is negative for thrombosis but shows effusions and atelectasis which is what we expected as etiology of hypoxemia.  This confirms our medical plan is correct and we will continue diuresis and chest physiotherapy.  UOP is unmeasured.  I have dcd her ramipril  to allow us  to diurese her pleural effusions, and lasix has been increased to bid.  I would favor holding her antihypertensive therapy while we are diuresing.    07/31/24- patient diuresed overnight and is close to baseline now, she should be able to wean off oxygen or may need it on outpatient for short time while recovering from multiple rib fractures.  I can follow up with her on outpatient.  She can be dcd today if approved by attending physician.    PAST MEDICAL HISTORY   Past Medical History:  Diagnosis Date   Acute respiratory failure with hypoxia (HCC) 09/19/2023   Anemia    Anxiety    Arthritis    Bronchitis, chronic obstructive (HCC)    COPD exacerbation (HCC) 06/20/2023   Depression    Diabetes mellitus 10/2010   a1c 5.7, fasting glu 155   Dyspnea    with exertion   H/O bronchitis    Heart murmur    High cholesterol    Hypertension    Menopausal syndrome  NSTEMI (non-ST elevated myocardial infarction) (HCC) 09/19/2023   Tobacco abuse      SURGICAL HISTORY   Past Surgical History:  Procedure Laterality Date   BREAST BIOPSY Left 07/26/2016   complex sclerosing lesion. Coil shape marker   BREAST LUMPECTOMY Left 08/17/2016   complex sclerosing lesion with focal ADH. Clear margins. The coil shaped biopsy marker remains    BREAST LUMPECTOMY WITH NEEDLE LOCALIZATION Left 08/17/2016   Procedure: BREAST LUMPECTOMY WITH NEEDLE LOCALIZATION;  Surgeon: Louanne KANDICE Muse, MD;  Location: [facility] ORS;  Service: General;  Laterality: Left;   CERVICAL CONE BIOPSY     DILATION AND CURETTAGE OF UTERUS     EXCISION / BIOPSY BREAST /  NIPPLE / DUCT Left 08/17/2016   complex sclerosing lesion removed   LIPOMA EXCISION Right    benign tumor right leg   LYMPH NODE BIOPSY     TONSILLECTOMY       FAMILY HISTORY   Family History  Problem Relation Age of Onset   Cancer Mother        melanoma   Cancer Father        Lung Cancer   Early death Brother    Heart disease Brother    Breast cancer Paternal Aunt 61   Colon cancer Other    Ovarian cancer Neg Hx    Diabetes Neg Hx      SOCIAL HISTORY   Social History   Tobacco Use   Smoking status: Every Day    Average packs/day: 0.5 packs/day for 45.1 years (22.6 ttl pk-yrs)    Types: Cigarettes    Start date: 10/03/1978    Passive exposure: Past   Smokeless tobacco: Never  Vaping Use   Vaping status: Never Used  Substance Use Topics   Alcohol use: Yes    Alcohol/week: 6.0 standard drinks of alcohol    Types: 6 Glasses of wine per week    Comment: daily   Drug use: No     MEDICATIONS       Current Medication:  Current Facility-Administered Medications:    acetaminophen  (TYLENOL ) tablet 650 mg, 650 mg, Oral, TID, Von Bellis, MD, 650 mg at 08/01/24 9171   amLODipine (NORVASC) tablet 10 mg, 10 mg, Oral, Daily, Von Bellis, MD, 10 mg at 08/01/24 9170   arformoterol  (BROVANA ) nebulizer solution 15 mcg, 15 mcg, Nebulization, BID, 15 mcg at 08/01/24 0827 **AND** budesonide  (PULMICORT ) nebulizer solution 0.25 mg, 0.25 mg, Nebulization, BID, Von Bellis, MD, 0.25 mg at 08/01/24 9173   bisacodyl  (DULCOLAX) EC tablet 10 mg, 10 mg, Oral, QHS, Von Bellis, MD, 10 mg at 07/31/24 2122   bisacodyl  (DULCOLAX) suppository 10 mg, 10 mg, Rectal, Daily PRN, Von Bellis, MD, 10 mg at 07/28/24 1845   citalopram  (CELEXA ) tablet 40 mg, 40 mg, Oral, Daily, Cleatus Hoof V, MD, 40 mg at 07/30/24 2136   [COMPLETED] cyanocobalamin  (VITAMIN B12) injection 1,000 mcg, 1,000 mcg, Intramuscular, Daily, 1,000 mcg at 07/31/24 0908 **FOLLOWED BY** cyanocobalamin  (VITAMIN B12)  tablet 1,000 mcg, 1,000 mcg, Oral, Daily, Von Bellis, MD, 1,000 mcg at 08/01/24 9164   enoxaparin  (LOVENOX ) injection 40 mg, 40 mg, Subcutaneous, Q24H, Von Bellis, MD, 40 mg at 08/01/24 9164   folic acid  (FOLVITE ) tablet 1 mg, 1 mg, Oral, Daily, Von Bellis, MD, 1 mg at 08/01/24 9170   furosemide (LASIX) tablet 40 mg, 40 mg, Oral, BID, Alexander, Natalie, DO, 40 mg at 08/01/24 9171   guaiFENesin  (MUCINEX ) 12 hr tablet 600 mg, 600 mg, Oral, BID, Von,  Dileep, MD, 600 mg at 08/01/24 9170   hydrALAZINE  (APRESOLINE ) injection 10 mg, 10 mg, Intravenous, Q6H PRN, Von Bellis, MD, 10 mg at 07/27/24 0911   HYDROcodone-acetaminophen  (NORCO/VICODIN) 5-325 MG per tablet 1 tablet, 1 tablet, Oral, Q6H PRN, Von Bellis, MD, 1 tablet at 08/01/24 0417   ipratropium-albuterol  (DUONEB) 0.5-2.5 (3) MG/3ML nebulizer solution 3 mL, 3 mL, Nebulization, Q4H PRN, Von Bellis, MD, 3 mL at 07/29/24 9047   LORazepam  (ATIVAN ) tablet 0.5 mg, 0.5 mg, Oral, BID PRN, Von Bellis, MD, 0.5 mg at 07/31/24 1938   magnesium  hydroxide (MILK OF MAGNESIA) suspension 30 mL, 30 mL, Oral, Daily PRN, Cleatus Delayne GAILS, MD, 30 mL at 07/28/24 2041   melatonin tablet 5 mg, 5 mg, Oral, QHS PRN, Cleatus Delayne GAILS, MD   multivitamin with minerals tablet 1 tablet, 1 tablet, Oral, Daily, Von Bellis, MD, 1 tablet at 08/01/24 9171   ondansetron  (ZOFRAN ) tablet 4 mg, 4 mg, Oral, Q6H PRN, 4 mg at 07/30/24 2039 **OR** ondansetron  (ZOFRAN ) injection 4 mg, 4 mg, Intravenous, Q6H PRN, Von Bellis, MD, 4 mg at 07/26/24 2032   pantoprazole  (PROTONIX ) EC tablet 40 mg, 40 mg, Oral, BID, Von Bellis, MD, 40 mg at 08/01/24 9171   polyethylene glycol (MIRALAX  / GLYCOLAX ) packet 17 g, 17 g, Oral, BID, Von Bellis, MD, 17 g at 08/01/24 9170   potassium chloride SA (KLOR-CON M) CR tablet 20 mEq, 20 mEq, Oral, BID, Arik Husmann, MD, 20 mEq at 08/01/24 9171   rosuvastatin  (CRESTOR ) tablet 5 mg, 5 mg, Oral, QPM, Von Bellis, MD, 5 mg at 07/31/24  1623   sodium chloride  flush (NS) 0.9 % injection 3 mL, 3 mL, Intravenous, Q12H, Von Bellis, MD, 3 mL at 08/01/24 0831   sodium chloride  flush (NS) 0.9 % injection 3 mL, 3 mL, Intravenous, Q12H, Von Bellis, MD, 3 mL at 08/01/24 0830   sodium chloride  flush (NS) 0.9 % injection 3 mL, 3 mL, Intravenous, PRN, Von Bellis, MD, 3 mL at 07/25/24 2138   thiamine  (VITAMIN B1) tablet 100 mg, 100 mg, Oral, Daily, 100 mg at 08/01/24 0829 **OR** thiamine  (VITAMIN B1) injection 100 mg, 100 mg, Intravenous, Daily, Von Bellis, MD, 100 mg at 07/25/24 0831   umeclidinium bromide  (INCRUSE ELLIPTA ) 62.5 MCG/ACT 1 puff, 1 puff, Inhalation, Daily, Von Bellis, MD, 1 puff at 08/01/24 0831    ALLERGIES   Zithromax  [azithromycin ], Quetiapine , and Buspar  [buspirone ]     REVIEW OF SYSTEMS    Review of Systems:  Gen:  Denies  fever, sweats, chills weigh loss  HEENT: Denies blurred vision, double vision, ear pain, eye pain, hearing loss, nose bleeds, sore throat Cardiac:  No dizziness, chest pain or heaviness, chest tightness,edema Resp:   reports dyspnea chronically  Gi: Denies swallowing difficulty, stomach pain, nausea or vomiting, diarrhea, constipation, bowel incontinence Gu:  Denies bladder incontinence, burning urine Ext:   Denies Joint pain, stiffness or swelling Skin: Denies  skin rash, easy bruising or bleeding or hives Endoc:  Denies polyuria, polydipsia , polyphagia or weight change Psych:   Denies depression, insomnia or hallucinations   Other:  All other systems negative   VS: BP 123/66 (BP Location: Right Arm)   Pulse 73   Temp 97.8 F (36.6 C)   Resp 17   Ht 5' 5 (1.651 m)   Wt 55 kg   SpO2 97%   BMI 20.18 kg/m      PHYSICAL EXAM    GENERAL:NAD, no fevers, chills, no weakness no fatigue HEAD:  Normocephalic, atraumatic.  EYES: Pupils equal, round, reactive to light. Extraocular muscles intact. No scleral icterus.  MOUTH: Moist mucosal membrane. Dentition intact.  No abscess noted.  EAR, NOSE, THROAT: Clear without exudates. No external lesions.  NECK: Supple. No thyromegaly. No nodules. No JVD.  PULMONARY: decreased breath sounds with mild rhonchi worse at bases bilaterally.  CARDIOVASCULAR: S1 and S2. Regular rate and rhythm. No murmurs, rubs, or gallops. No edema. Pedal pulses 2+ bilaterally.  GASTROINTESTINAL: Soft, nontender, nondistended. No masses. Positive bowel sounds. No hepatosplenomegaly.  MUSCULOSKELETAL: No swelling, clubbing, or edema. Range of motion full in all extremities.  NEUROLOGIC: Cranial nerves II through XII are intact. No gross focal neurological deficits. Sensation intact. Reflexes intact.  SKIN: No ulceration, lesions, rashes, or cyanosis. Skin warm and dry. Turgor intact.  PSYCHIATRIC: Mood, affect within normal limits. The patient is awake, alert and oriented x 3. Insight, judgment intact.       IMAGING   Study Result  Narrative & Impression  EXAM: CT CHEST, ABDOMEN AND PELVIS WITH CONTRAST 07/24/2024 05:48:42 AM   TECHNIQUE: CT of the chest, abdomen and pelvis was performed with the administration of intravenous contrast. Multiplanar reformatted images are provided for review. Automated exposure control, iterative reconstruction, and/or weight based adjustment of the mA/kV was utilized to reduce the radiation dose to as low as reasonably achievable.   COMPARISON: Chest CT 10/16/2023.   CLINICAL HISTORY: 63 year old female. Fall, left rib pain, abd pain, diarrhea, nausea.   FINDINGS:   CHEST:   MEDIASTINUM AND LYMPH NODES: Normal heart size with evidence of calcified coronary artery and aortic atherosclerosis. The central airways are clear. No mediastinal, hilar or axillary lymphadenopathy.   LUNGS AND PLEURA: Chronic emphysema. Large lung volumes. Trace retained secretions in the trachea. Central airways are patent. Occasional peripheral airway opacification due to retained secretion such as in the  right lower lobe on series 4 image 111. No consolidation or pleural effusion. Mild generalized bronchial wall thickening. No suspicious pulmonary nodule.   ABDOMEN AND PELVIS:   LIVER: Subcentimeter but circumscribed hypodensity in the left hepatic lobe on series 2 image 56 appears to be a small benign cyst or hemangioma (no follow up imaging recommended). Liver enhancement remains normal.   GALLBLADDER AND BILE DUCTS: Gallbladder is distended (coronal image 28). No pericholecystic inflammation, sludge, or stones evident by CT. No biliary ductal dilatation.   SPLEEN: Diminutive spleen.   PANCREAS: No acute abnormality.   ADRENAL GLANDS: No acute abnormality.   KIDNEYS, URETERS AND BLADDER: No stones in the kidneys or ureters. No hydronephrosis. No perinephric or periureteral stranding. Grossly negative urinary bladder.   GI AND BOWEL: Small gastric hiatal hernia. Motion artifact obscures detail of the distal small bowel in the pelvis and the rectosigmoid colon. Upstream large and small bowel loops are nondilated. Mildly redundant transverse colon. Cecum is on a lax mesentery. Normal gas containing appendix on coronal image 47.   REPRODUCTIVE ORGANS: Pelvis motion artifact. Obscured detail of the uterus and adnexa.   PERITONEUM AND RETROPERITONEUM: No ascites. No free air.   VASCULATURE: Advanced abdominal aortic calcified atherosclerosis continuing into the iliac arteries. Major arterial structures appear patent. Major portal venous structures appear patent.   ABDOMINAL AND PELVIS LYMPH NODES: No lymphadenopathy.   BONES AND SOFT TISSUES: Non displaced fractures of the anterior left left 5th and 6th ribs are subtle, new from the January CT. No displaced rib fracture. No superficial soft tissue injury. Maintained vertebral height. Lumbar disc and endplate  degeneration. Pelvis motion artifact.   IMPRESSION: 1. Subtle nondisplaced fractures of the anterior left 5th  and 6th ribs, new from prior chest CT. 2. Motion artifact in the pelvis.  No other acute traumatic injury identified. 3. Distended gallbladder but no CT evidence of acute cholecystitis. 4. Emphysema.  Mild airway thickening and retained secretions. 5. Coronary artery and aortic atherosclerosis.   Electronically signed by: Helayne Hurst MD 07/24/2024 06:16 AM EDT RP Workstation: [id number]     ASSESSMENT/PLAN    Acute hypoxemic respiratory failure -CT chest - no pneumonia or large aspiration   - She does have mild atelectasis and id like to try chest physiotherapy to recruit her bases and we should be able to wean her off oxygen.  She takes shallow breaths due to pain from left rib fractures.   - will order chest PT with incentive spirometry for now  - CT PE with atelectasis and effusions, continue crrent plan  Interstitial edema    Lasix 40 iv daily- improved   Alcoholism   Patient reports ongoing alcohilism and recently tried to quit with onset of withdrawal symptoms.  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  -will need CIWA protocol    Anxiety and Depression    - patient reports depression , she states she was unable to have children and has no family or support.  She drinks and smokes to keep herself occupied but admits to worsening depression and alcoholism.  She asked if someone could help her discuss these issues.  -will place [facility] consultation and consider adult mental health evaluation           Thank you for allowing me to participate in the care of this patient.   Patient/Family are satisfied with care plan and all questions have been answered.    Provider disclosure: Patient with at least one acute or chronic illness or injury that poses a threat to life or bodily function and is being managed actively during this encounter.   All of the below services have been performed independently by signing provider:  review of prior documentation from internal and or external health records.  Review of previous and current lab results.  Interview and comprehensive assessment during patient visit today. Review of current and previous chest radiographs/CT scans. Discussion of management and test interpretation with health care team and patient/family.   This document was prepared using Dragon voice recognition software and may include unintentional dictation errors.     Yesica Kemler, M.D.  Division of Pulmonary & Critical Care Medicine

## 2024-08-01 NOTE — Plan of Care (Signed)

## 2024-08-01 NOTE — Telephone Encounter (Signed)
 Copied from CRM [id number]. Topic: General - Other >> Jul 31, 2024  5:13 PM Hadassah PARAS wrote: Reason for CRM: Dorothe for [facility]:  [facility] will be serviving pt [facility]. If there are any questions they will be contacting PCP

## 2024-08-02 ENCOUNTER — Inpatient Hospital Stay: Admitting: Pediatrics

## 2024-08-05 ENCOUNTER — Telehealth: Payer: Self-pay | Admitting: Pediatrics

## 2024-08-05 NOTE — Telephone Encounter (Unsigned)
 Copied from CRM [id number]. Topic: Clinical - Home Health Verbal Orders >> Aug 05, 2024  9:22 AM Samuella CROME wrote: Caller/AgencyBETHA Doffing [facility] Callback Number: [phone] Service Requested: Physical Therapy Frequency: Evaluation on 08/09/2024 Any new concerns about the patient? No

## 2024-08-07 ENCOUNTER — Other Ambulatory Visit: Payer: Self-pay | Admitting: Pediatrics

## 2024-08-07 DIAGNOSIS — F419 Anxiety disorder, unspecified: Secondary | ICD-10-CM

## 2024-08-07 DIAGNOSIS — F410 Panic disorder [episodic paroxysmal anxiety] without agoraphobia: Secondary | ICD-10-CM

## 2024-08-07 NOTE — Telephone Encounter (Signed)
 Left voicemail with information

## 2024-08-08 ENCOUNTER — Other Ambulatory Visit: Payer: Self-pay | Admitting: Pediatrics

## 2024-08-08 ENCOUNTER — Telehealth: Payer: Self-pay | Admitting: Pediatrics

## 2024-08-08 DIAGNOSIS — F410 Panic disorder [episodic paroxysmal anxiety] without agoraphobia: Secondary | ICD-10-CM

## 2024-08-08 DIAGNOSIS — F419 Anxiety disorder, unspecified: Secondary | ICD-10-CM

## 2024-08-08 MED ORDER — LORAZEPAM 0.5 MG PO TABS: 0.5000 mg | ORAL_TABLET | Freq: Two times a day (BID) | ORAL | 0 refills | Status: DC | PRN

## 2024-08-08 NOTE — Telephone Encounter (Signed)
 Requested medication (s) are due for refill today: yes  Requested medication (s) are on the active medication list: yes  Last refill:  06/28/24  Future visit scheduled: no  Notes to clinic:  Unable to refill per protocol, cannot delegate.      Requested Prescriptions  Pending Prescriptions Disp Refills   LORazepam  (ATIVAN ) 0.5 MG tablet [Pharmacy Med Name: LORAZEPAM  0.5 MG TAB] 60 tablet     Sig: TAKE 1 TABLET BY MOUTH TWICE DAILY AS NEEDED FOR ANXIETY     Not Delegated - Psychiatry: Anxiolytics/Hypnotics 2 Failed - 08/08/2024  4:13 PM      Failed - This refill cannot be delegated      Passed - Urine Drug Screen completed in last 360 days      Passed - Patient is not pregnant      Passed - Valid encounter within last 6 months    Recent Outpatient Visits           1 month ago COPD with acute exacerbation (HCC)   [place] [facility] Herold Hadassah SQUIBB, MD   2 months ago Anxiety   [place] [facility] Hadassah SQUIBB, MD   4 months ago Chronic obstructive pulmonary disease with acute exacerbation [facility])   [place] [facility]old Hadassah SQUIBB, MD   4 months ago COPD with acute exacerbation [facility])   [place] [facility] [place], Latta T, NP   4 months ago Chronic obstructive pulmonary disease with acute exacerbation [facility])   [place] [facility] Herold Hadassah SQUIBB, MD

## 2024-08-08 NOTE — Telephone Encounter (Signed)
 Called patient and left a message for her to call back to get scheduled.

## 2024-08-08 NOTE — Telephone Encounter (Signed)
 Copied from CRM #[id number]. Topic: Clinical - Medication Refill >> Aug 08, 2024 10:33 AM Kendralyn S wrote: Medication: LORazepam  (ATIVAN ) 0.5 MG tablet   Has the patient contacted their pharmacy? Yes (Agent: If no, request that the patient contact the pharmacy for the refill. If patient does not wish to contact the pharmacy document the reason why and proceed with request.) (Agent: If yes, when and what did the pharmacy advise?)  This is the patient's preferred pharmacy:  [facility] - [place], [place] - [address] [place] [address] Phone: [phone] Fax: [phone]  Is this the correct pharmacy for this prescription? Yes If no, delete pharmacy and type the correct one.   Has the prescription been filled recently? No  Is the patient out of the medication? No, 1 or 2 left  Has the patient been seen for an appointment in the last year OR does the patient have an upcoming appointment? Yes  Can we respond through MyChart? No  Agent: Please be advised that Rx refills may take up to 3 business days. We ask that you follow-up with your pharmacy.

## 2024-08-08 NOTE — Telephone Encounter (Unsigned)
 Copied from CRM [id number]. Topic: Clinical - Medical Advice >> Aug 08, 2024  4:11 PM Amy B wrote: Reason for CRM: Darryle with [facility] states this is the second time the patient is delaying start of care.  He requests a new date of November.  [phone]

## 2024-08-09 NOTE — Telephone Encounter (Signed)
 Called and LVM giving verbal order per Dr. Herold.

## 2024-08-12 ENCOUNTER — Telehealth: Payer: Self-pay | Admitting: Pediatrics

## 2024-08-12 NOTE — Telephone Encounter (Signed)
 Patient did not want to schedule a WTM and said said she will call social security to see if its required.

## 2024-08-13 ENCOUNTER — Telehealth: Payer: Self-pay | Admitting: Pediatrics

## 2024-08-13 NOTE — Telephone Encounter (Unsigned)
 Copied from CRM [id number]. Topic: Clinical - Home Health Verbal Orders >> Aug 13, 2024  1:26 PM Myrick T wrote: Caller/Agency: Darryle from [facility] Callback Number: [phone] Service Requested: Physical Therapy Frequency: 1x9w Skilled Nursing Eval Any new concerns about the patient? No

## 2024-08-14 ENCOUNTER — Inpatient Hospital Stay: Admitting: Pediatrics

## 2024-08-14 NOTE — Telephone Encounter (Signed)
 Ok to provide verbal

## 2024-08-16 NOTE — Telephone Encounter (Signed)
 Therapist has been called and a message left for them to return the call to the office. Ok for E2C2 to review if/when they return the call.   Please advise if/when he returns call.

## 2024-08-20 ENCOUNTER — Ambulatory Visit: Admitting: Pediatrics

## 2024-08-20 VITALS — BP 108/70 | HR 100 | Wt 115.2 lb

## 2024-08-20 DIAGNOSIS — F419 Anxiety disorder, unspecified: Secondary | ICD-10-CM

## 2024-08-20 DIAGNOSIS — F1090 Alcohol use, unspecified, uncomplicated: Secondary | ICD-10-CM

## 2024-08-20 DIAGNOSIS — F17210 Nicotine dependence, cigarettes, uncomplicated: Secondary | ICD-10-CM

## 2024-08-20 DIAGNOSIS — F410 Panic disorder [episodic paroxysmal anxiety] without agoraphobia: Secondary | ICD-10-CM

## 2024-08-20 DIAGNOSIS — E871 Hypo-osmolality and hyponatremia: Secondary | ICD-10-CM

## 2024-08-20 DIAGNOSIS — J441 Chronic obstructive pulmonary disease with (acute) exacerbation: Secondary | ICD-10-CM | POA: Diagnosis not present

## 2024-08-20 MED ORDER — NICOTINE 7 MG/24HR TD PT24: 7.0000 mg | MEDICATED_PATCH | Freq: Every day | TRANSDERMAL | 3 refills | Status: DC

## 2024-08-20 MED ORDER — CITALOPRAM HYDROBROMIDE 40 MG PO TABS: 40.0000 mg | ORAL_TABLET | Freq: Every day | ORAL | 2 refills | Status: AC

## 2024-08-20 MED ORDER — AMOXICILLIN-POT CLAVULANATE 875-125 MG PO TABS: 1.0000 | ORAL_TABLET | Freq: Two times a day (BID) | ORAL | 0 refills | Status: AC

## 2024-08-20 MED ORDER — METHYLPREDNISOLONE 4 MG PO TBPK: ORAL_TABLET | ORAL | 0 refills | Status: DC

## 2024-08-20 NOTE — Progress Notes (Addendum)
 Office Visit  BP 108/70 (BP Location: Left Arm, Patient Position: Sitting)   Pulse 100   Wt 115 lb 3.2 oz (52.3 kg)   SpO2 95%   BMI 19.17 kg/m    Subjective:    Patient ID: Christine Sparks, female    DOB: 1961/05/28, 63 y.o.   MRN: [id number]  HPI: Christine Sparks is a 63 y.o. female  Chief Complaint  Patient presents with   Hospitalization Follow-up    Fractured ribs due to a fall, has had phlegm that she's been trying to cough up and has been ineffective, she has been taking OTC mucinex    Shortness of Breath    Discussed the use of AI scribe software for clinical note transcription with the patient, who gave verbal consent to proceed.  History of Present Illness   Christine Sparks is a 63 year old female with COPD who presents with worsening cough and difficulty breathing following a rib fracture.  She has been experiencing a worsening productive cough and difficulty breathing following a fall that resulted in two fractured ribs. The cough has become more severe over the past three days, and she has difficulty clearing her lungs, which is exacerbated by the rib fractures. She has been using a nebulizer at home to manage her symptoms.  The fall occurred in the laundry room when she slipped on the concrete floor while reaching into the dryer, resulting in a chest impact. She has been experiencing significant pain from the rib fractures and has been taking ibuprofen for pain management.  She has a history of COPD. She recalls a previous experience with prednisone  that was too strong and prefers a tapering dose. She also mentions a previous antibiotic that caused sickness and prefers to switch to amoxicillin .  Her medication regimen includes Ativan , which she is cautious about using with alcohol. She has been abstinent from alcohol since her last hospital admission. She continues to take citalopram  for anxiety and requires a refill.  She has been prescribed vitamins due to past alcohol  use but stopped them due to nausea. She is currently taking a diuretic and continues with a multivitamin like Centrum. She also requests nicotine  patches to manage cravings.        Relevant past medical, surgical, family and social history reviewed and updated as indicated. Interim medical history since our last visit reviewed. Allergies and medications reviewed and updated.  ROS per HPI unless specifically indicated above     Objective:    BP 108/70 (BP Location: Left Arm, Patient Position: Sitting)   Pulse 100   Wt 115 lb 3.2 oz (52.3 kg)   SpO2 95%   BMI 19.17 kg/m   Wt Readings from Last 3 Encounters:  08/26/24 115 lb (52.2 kg)  08/20/24 115 lb 3.2 oz (52.3 kg)  07/24/24 121 lb 4.1 oz (55 kg)     Physical Exam Constitutional:      Appearance: Normal appearance.  Pulmonary:     Effort: Pulmonary effort is normal.     Breath sounds: Examination of the left-upper field reveals wheezing. Decreased breath sounds and wheezing present.  Musculoskeletal:        General: Normal range of motion.  Skin:    Comments: Normal skin color  Neurological:     General: No focal deficit present.     Mental Status: She is alert. Mental status is at baseline.  Psychiatric:        Mood and Affect: Mood normal.  Behavior: Behavior normal.        Thought Content: Thought content normal.         06/13/2024    2:01 PM 05/13/2024   10:55 AM 03/13/2024    1:36 PM 01/19/2024    8:56 AM 12/06/2023    1:16 PM  Depression screen PHQ 2/9  Decreased Interest 3 1 1  0 1  Down, Depressed, Hopeless 1 1 1  0 0  PHQ - 2 Score 4 2 2  0 1  Altered sleeping 0 1 2  1   Tired, decreased energy 1 1 2  1   Change in appetite 1 1 2  1   Feeling bad or failure about yourself  0 0 2  0  Trouble concentrating 0 1 0  0  Moving slowly or fidgety/restless 0 0 0  0  Suicidal thoughts 0 0 0  0  PHQ-9 Score 6  6  10   4    Difficult doing work/chores Not difficult at all    Somewhat difficult     Data saved  with a previous flowsheet row definition       06/13/2024    2:04 PM 05/13/2024   10:56 AM 03/13/2024    1:36 PM 12/06/2023    1:16 PM  GAD 7 : Generalized Anxiety Score  Nervous, Anxious, on Edge 0 2 1   Control/stop worrying 0 2 1 0  Worry too much - different things 0 2 1 0  Trouble relaxing 0 2 1 1   Restless 0 0 0 0  Easily annoyed or irritable 0 1 1 0  Afraid - awful might happen 0 1 1 0  Total GAD 7 Score 0 10 6   Anxiety Difficulty Not difficult at all   Somewhat difficult       Assessment & Plan:  Assessment & Plan   COPD exacerbation (HCC) Worsening cough and difficulty expectorating sputum likely due to rib fractures and COPD exacerbation. Previous prednisone  was too strong, and a different antibiotic is needed due to adverse effects. Strongly recommend she establishes with pulm (missed prior apt). - Prescribed low-dose prednisone  with tapering schedule. - Prescribed amoxicillin  as alternative antibiotic. - Advised use of nebulizer at home. - Instructed to call if breathing does not improve in one week for reassessment. -     methylPREDNISolone ; Follow instructions in packaging  Dispense: 21 each; Refill: 0 -     Amoxicillin -Pot Clavulanate; Take 1 tablet by mouth 2 (two) times daily for 7 days.  Dispense: 14 tablet; Refill: 0  Anxiety Panic attacks Managed with Ativan  and citalopram . Discussed reducing Ativan  to minimize risks with alcohol use. Citalopram  refill needed though may need to switch if persistent hyponatremia. She already picked up the ativan  rx with twice daily dosing, I emphasized would not fill BID dosing again. - Reduce Ativan  to once daily and monitor for withdrawal symptoms. Provided a 30 day once daily fill to start 12/6 with one refill to carry through to next provider - Refilled citalopram  prescription.  -     Citalopram  Hydrobromide; Take 1 tablet (40 mg total) by mouth daily.  Dispense: 90 tablet; Refill: 2  Alcohol use disorder Reports  abstinence at this time since hospitalization. Discussed risks of alcohol use with Ativan  and potential for alcohol cravings/withdrawal. - Continue abstinence from alcohol. - Discussed availability of medication for alcohol cravings (naltrexone ) if needed.Advised against alcohol use and ongoing benzo use.   Hyponatremia May need to dc citalopram  if ongoing hyponatremia. Suspect AUD contributing. -  Basic metabolic panel with GFR -     Magnesium   Cigarette nicotine  dependence without complication Reports cravings for nicotine . - Prescribed nicotine  patches, 7 mg with refills. -     Nicotine ; Place 1 patch (7 mg total) onto the skin daily.  Dispense: 90 patch; Refill: 3   Follow up plan: Return in about 2 months (around 10/20/2024) for COPD.  Hadassah SHAUNNA Nett, MD

## 2024-08-21 ENCOUNTER — Other Ambulatory Visit: Payer: Self-pay | Admitting: Pediatrics

## 2024-08-21 ENCOUNTER — Ambulatory Visit: Payer: Self-pay | Admitting: Pediatrics

## 2024-08-21 DIAGNOSIS — E871 Hypo-osmolality and hyponatremia: Secondary | ICD-10-CM

## 2024-08-21 LAB — CBC WITH DIFFERENTIAL/PLATELET
Basophils Absolute: 0.1 x10E3/uL (ref 0.0–0.2)
Basos: 1 %
EOS (ABSOLUTE): 0.3 x10E3/uL (ref 0.0–0.4)
Eos: 3 %
Hematocrit: 36.9 % (ref 34.0–46.6)
Hemoglobin: 12 g/dL (ref 11.1–15.9)
Immature Grans (Abs): 0 x10E3/uL (ref 0.0–0.1)
Immature Granulocytes: 0 %
Lymphocytes Absolute: 1.5 x10E3/uL (ref 0.7–3.1)
Lymphs: 18 %
MCH: 31.3 pg (ref 26.6–33.0)
MCHC: 32.5 g/dL (ref 31.5–35.7)
MCV: 96 fL (ref 79–97)
Monocytes Absolute: 0.8 x10E3/uL (ref 0.1–0.9)
Monocytes: 10 %
Neutrophils Absolute: 5.7 x10E3/uL (ref 1.4–7.0)
Neutrophils: 68 %
Platelets: 609 x10E3/uL — ABNORMAL HIGH (ref 150–450)
RBC: 3.83 x10E6/uL (ref 3.77–5.28)
RDW: 12.7 % (ref 11.7–15.4)
WBC: 8.4 x10E3/uL (ref 3.4–10.8)

## 2024-08-21 LAB — BASIC METABOLIC PANEL WITH GFR
BUN/Creatinine Ratio: 12 (ref 12–28)
BUN: 10 mg/dL (ref 8–27)
CO2: 31 mmol/L — ABNORMAL HIGH (ref 20–29)
Calcium: 9.3 mg/dL (ref 8.7–10.3)
Chloride: 78 mmol/L — ABNORMAL LOW (ref 96–106)
Creatinine, Ser: 0.82 mg/dL (ref 0.57–1.00)
Glucose: 119 mg/dL — ABNORMAL HIGH (ref 70–99)
Potassium: 3.4 mmol/L — ABNORMAL LOW (ref 3.5–5.2)
Sodium: 125 mmol/L — ABNORMAL LOW (ref 134–144)
eGFR: 79 mL/min/1.73 (ref >59)

## 2024-08-21 LAB — MAGNESIUM: Magnesium: 1.6 mg/dL (ref 1.6–2.3)

## 2024-08-21 NOTE — Progress Notes (Signed)
 Placed future order for hyponatremia work up and repeat cbc.  Christine SHAUNNA Nett, MD

## 2024-08-22 ENCOUNTER — Ambulatory Visit: Payer: Self-pay

## 2024-08-22 NOTE — Telephone Encounter (Signed)
 FYI Only or Action Required?: FYI only for provider: ED advised.  Patient was last seen in primary care on 08/20/2024 by Christine Hadassah SQUIBB, MD.  Called Nurse Triage reporting Advice Only.  Symptoms began yesterday.  Interventions attempted: Nothing.  Symptoms are: unchanged.  Triage Disposition: Call PCP Now  Patient/caregiver understands and will follow disposition?: No, refuses disposition   CAL notified by this RN.   Copied from CRM #[id number]. Topic: Clinical - Medical Advice >> Aug 22, 2024  8:26 AM [name] wrote: Reason for CRM: Patient called in regarding missed call, relay labs patient has some questions regarding her labs and sodium would like a callback Reason for Disposition  [1] Follow-up call from patient regarding patient's clinical status AND [2] information urgent  Answer Assessment - Initial Assessment Questions 1. REASON FOR CALL: What is the main reason for your call? or How can I best help you?     Discuss lab results 2. SYMPTOMS : Do you have any symptoms?      Rn asked if she was having confusion pt stated a little bit. RN advised recommendation was for her to go to the ER. Pt states she is not going to do that. RN advised why she would need to go to the Er. She states she is going to stop the diuretic. RN advised not to stop the diuretic and that is likely not the reason her sodium is low. RN asked if she was drinking water, she states she drinks lots of water. Rn advised her to cut back on her water. She stated understanding. She again stated she was not going to the Er. States she just got out of the hospital and this was getting ridiculous. Then said thank you and ended the call.  Protocols used: PCP Call - No Triage-A-AH

## 2024-08-23 ENCOUNTER — Other Ambulatory Visit: Payer: Self-pay | Admitting: Pediatrics

## 2024-08-23 ENCOUNTER — Other Ambulatory Visit

## 2024-08-23 ENCOUNTER — Telehealth: Payer: Self-pay

## 2024-08-23 ENCOUNTER — Telehealth: Payer: Self-pay | Admitting: Pediatrics

## 2024-08-23 DIAGNOSIS — J438 Other emphysema: Secondary | ICD-10-CM

## 2024-08-23 DIAGNOSIS — E871 Hypo-osmolality and hyponatremia: Secondary | ICD-10-CM

## 2024-08-23 NOTE — Telephone Encounter (Signed)
 Copied from CRM [id number]. Topic: Clinical - Home Health Verbal Orders >> Aug 23, 2024  2:11 PM Selinda RAMAN wrote: Caller/Agency: Ted Skilled Nurse with [facility] Callback Number: [phone] Any new concerns about the patient? Yes the patient states she is not interested in any skilled nursing at this time  Please assist patient further

## 2024-08-23 NOTE — Telephone Encounter (Signed)
See other encounter. Refill request sent to provider.

## 2024-08-23 NOTE — Telephone Encounter (Signed)
 Copied from CRM [id number]. Topic: Clinical - Prescription Issue >> Aug 23, 2024  2:29 PM Winona R wrote: Pt would like for her rx Trelegy to be sent to [facility] - [place], [place] - [address] [[id number]]. It has been reordered by [employer], Brittany N on 11/21. Never filled at office

## 2024-08-23 NOTE — Telephone Encounter (Signed)
 FYI to provider

## 2024-08-23 NOTE — Telephone Encounter (Signed)
 Copied from CRM [id number]. Topic: Clinical - Home Health Verbal Orders >> Aug 23, 2024  3:55 PM Shanda MATSU wrote: Caller/Agency: Damien Dickens w/[facility] Callback Number: [phone] Service Requested: Occupational Therapy Frequency: one time a week for the next 6 weeks Any new concerns about the patient? No

## 2024-08-26 ENCOUNTER — Emergency Department

## 2024-08-26 ENCOUNTER — Other Ambulatory Visit: Payer: Self-pay

## 2024-08-26 ENCOUNTER — Emergency Department
Admission: EM | Admit: 2024-08-26 | Discharge: 2024-08-26 | Disposition: A | Discharge status: Home / Self Care | Attending: Emergency Medicine | Admitting: Emergency Medicine

## 2024-08-26 ENCOUNTER — Other Ambulatory Visit: Payer: Self-pay | Admitting: Emergency Medicine

## 2024-08-26 DIAGNOSIS — J441 Chronic obstructive pulmonary disease with (acute) exacerbation: Secondary | ICD-10-CM | POA: Insufficient documentation

## 2024-08-26 DIAGNOSIS — R059 Cough, unspecified: Secondary | ICD-10-CM | POA: Diagnosis not present

## 2024-08-26 DIAGNOSIS — R0602 Shortness of breath: Secondary | ICD-10-CM | POA: Diagnosis present

## 2024-08-26 LAB — BASIC METABOLIC PANEL WITH GFR
Anion gap: 10 (ref 5–15)
BUN: 9 mg/dL (ref 8–23)
CO2: 32 mmol/L (ref 22–32)
Calcium: 9.1 mg/dL (ref 8.9–10.3)
Chloride: 90 mmol/L — ABNORMAL LOW (ref 98–111)
Creatinine, Ser: 0.66 mg/dL (ref 0.44–1.00)
GFR, Estimated: >60 mL/min (ref >60)
Glucose, Bld: 111 mg/dL — ABNORMAL HIGH (ref 70–99)
Potassium: 3 mmol/L — ABNORMAL LOW (ref 3.5–5.1)
Sodium: 132 mmol/L — ABNORMAL LOW (ref 135–145)

## 2024-08-26 LAB — TROPONIN T, HIGH SENSITIVITY
Troponin T High Sensitivity: 35 ng/L — ABNORMAL HIGH (ref 0–19)
Troponin T High Sensitivity: 30 ng/L — ABNORMAL HIGH (ref 0–19)

## 2024-08-26 LAB — CBC
HCT: 34.6 % — ABNORMAL LOW (ref 36.0–46.0)
Hemoglobin: 12 g/dL (ref 12.0–15.0)
MCH: 31.7 pg (ref 26.0–34.0)
MCHC: 34.7 g/dL (ref 30.0–36.0)
MCV: 91.3 fL (ref 80.0–100.0)
Platelets: 572 K/uL — ABNORMAL HIGH (ref 150–400)
RBC: 3.79 MIL/uL — ABNORMAL LOW (ref 3.87–5.11)
RDW: 12.5 % (ref 11.5–15.5)
WBC: 11.3 K/uL — ABNORMAL HIGH (ref 4.0–10.5)
nRBC: 0 % (ref 0.0–0.2)

## 2024-08-26 LAB — RESP PANEL BY RT-PCR (RSV, FLU A&B, COVID)  RVPGX2
Influenza A by PCR: NEGATIVE
Influenza B by PCR: NEGATIVE
Resp Syncytial Virus by PCR: NEGATIVE
SARS Coronavirus 2 by RT PCR: NEGATIVE

## 2024-08-26 LAB — PRO BRAIN NATRIURETIC PEPTIDE: Pro Brain Natriuretic Peptide: 253 pg/mL (ref <300.0)

## 2024-08-26 MED ORDER — TRAMADOL HCL 50 MG PO TABS
50.0000 mg | ORAL_TABLET | Freq: Once | ORAL | Status: AC
  Administered 2024-08-26: 50 mg via ORAL
  Filled 2024-08-26: qty 1

## 2024-08-26 MED ORDER — ALBUTEROL SULFATE HFA 108 (90 BASE) MCG/ACT IN AERS: 2.0000 | INHALATION_SPRAY | RESPIRATORY_TRACT | 0 refills | Status: DC | PRN

## 2024-08-26 MED ORDER — TRAZODONE HCL 50 MG PO TABS: 50.0000 mg | ORAL_TABLET | Freq: Every evening | ORAL | 0 refills | Status: DC | PRN

## 2024-08-26 MED ORDER — IPRATROPIUM-ALBUTEROL 0.5-2.5 (3) MG/3ML IN SOLN
3.0000 mL | Freq: Once | RESPIRATORY_TRACT | Status: AC
  Administered 2024-08-26: 3 mL via RESPIRATORY_TRACT
  Filled 2024-08-26: qty 3

## 2024-08-26 MED ORDER — PREDNISONE 50 MG PO TABS: 50.0000 mg | ORAL_TABLET | Freq: Every day | ORAL | 0 refills | Status: AC

## 2024-08-26 MED ORDER — TRELEGY ELLIPTA 100-62.5-25 MCG/ACT IN AEPB: 1.0000 | INHALATION_SPRAY | Freq: Every day | RESPIRATORY_TRACT | 0 refills | Status: DC

## 2024-08-26 MED ORDER — ALBUTEROL SULFATE (2.5 MG/3ML) 0.083% IN NEBU: 2.5000 mg | INHALATION_SOLUTION | Freq: Once | RESPIRATORY_TRACT | Status: DC

## 2024-08-26 NOTE — ED Notes (Signed)
 Pt up to toilet

## 2024-08-26 NOTE — ED Notes (Signed)
 Pt assisted back into bed and placed back on monitor.

## 2024-08-26 NOTE — ED Notes (Addendum)
 Pt still on toilet and will call when done

## 2024-08-26 NOTE — Discharge Instructions (Signed)
 Take the prednisone  daily for the next 5 days, starting tomorrow.  Use your Trelegy as normal.  Use the albuterol  inhaler up to every 4-6 hours as needed for shortness of breath.  Follow-up with your primary care provider.  Return to the ER immediately for new, worsening, or persistent severe shortness of breath, cough, fever, chest pain, or any other new or worsening symptoms that concern you.

## 2024-08-26 NOTE — ED Notes (Addendum)
 Lab called and spoke with [facility] and they will add on blood tests to blood in lab at this time.MD updated

## 2024-08-26 NOTE — ED Triage Notes (Addendum)
 Pt comes via EMS from home with c/o sob. Pt states this started last Wed. Pt recently dcx with COPD. Pt states since yesterday increased dyspnea. Pt was on 1L on [place] at home. RA was 95%. Pt has had productive cough. Pt had some relief after prednisone . Script ended and back in flare up.   Pt given 1 duoneb by EMS and 125 solu medrol .

## 2024-08-26 NOTE — ED Provider Notes (Signed)
 [facility] Provider Note    Event Date/Time   First MD Initiated Contact with Patient 08/26/24 [phone]     (approximate)   History   Shortness of Breath   HPI  Christine Sparks is a 63 y.o. female with a history of COPD, pulmonary edema, hyponatremia, alcohol use disorder, who presents with shortness of breath over the last several days, gradual onset, worsening course, associated with nonproductive cough.  The patient states that she started to have the symptoms last week and was started on antibiotics and steroid.  She has finished the steroid and over the last day feels like the shortness of breath has worsened.  She denies any leg swelling.  She has no fever.  She denies any vomiting or diarrhea.  She does report some rib pain related to her cough but no chest pain.  I reviewed the past medical records.  The patient was admitted to the hospitalist service last month with hypotonic hyponatremia and acute respiratory failure.   Physical Exam   Triage Vital Signs: ED Triage Vitals  Encounter Vitals Group     BP 08/26/24 0726 131/70     Girls Systolic BP Percentile --      Girls Diastolic BP Percentile --      Boys Systolic BP Percentile --      Boys Diastolic BP Percentile --      Pulse Rate 08/26/24 0726 87     Resp 08/26/24 0726 16     Temp --      Temp src --      SpO2 08/26/24 0726 97 %     Weight 08/26/24 0724 115 lb (52.2 kg)     Height 08/26/24 0724 5' 5 (1.651 m)     Head Circumference --      Peak Flow --      Pain Score 08/26/24 0724 6     Pain Loc --      Pain Education --      Exclude from Growth Chart --     Most recent vital signs: Vitals:   08/26/24 1145 08/26/24 1206  BP:  123/69  Pulse: 82 82  Resp: 19 19  Temp:  98 F (36.7 C)  SpO2: 99% 98%     General: Alert, relatively well-appearing, no distress.  CV:  Good peripheral perfusion.  Resp:  Slightly increased respiratory effort.  Diminished breath sounds bilaterally  with scattered wheezes. Abd:  No distention.  Other:  No peripheral edema.   ED Results / Procedures / Treatments   Labs (all labs ordered are listed, but only abnormal results are displayed) Labs Reviewed  BASIC METABOLIC PANEL WITH GFR - Abnormal; Notable for the following components:      Result Value   Sodium 132 (*)    Potassium 3.0 (*)    Chloride 90 (*)    Glucose, Bld 111 (*)    All other components within normal limits  CBC - Abnormal; Notable for the following components:   WBC 11.3 (*)    RBC 3.79 (*)    HCT 34.6 (*)    Platelets 572 (*)    All other components within normal limits  TROPONIN T, HIGH SENSITIVITY - Abnormal; Notable for the following components:   Troponin T High Sensitivity 35 (*)    All other components within normal limits  TROPONIN T, HIGH SENSITIVITY - Abnormal; Notable for the following components:   Troponin T High Sensitivity 30 (*)  All other components within normal limits  RESP PANEL BY RT-PCR (RSV, FLU A&B, COVID)  RVPGX2  PRO BRAIN NATRIURETIC PEPTIDE     EKG  ED ECG REPORT I, Waylon Cassis, the attending physician, personally viewed and interpreted this ECG.  Date: 08/26/2024 EKG Time: 0727 Rate: 86 Rhythm: normal sinus rhythm QRS Axis: Borderline right axis Intervals: normal ST/T Wave abnormalities: normal Narrative Interpretation: no evidence of acute ischemia    RADIOLOGY  Chest x-ray: I independently viewed and interpreted the images; there is no focal consolidation or edema  PROCEDURES:  Critical Care performed: No  Procedures   MEDICATIONS ORDERED IN ED: Medications  traMADol  (ULTRAM ) tablet 50 mg (50 mg Oral Given 08/26/24 0804)  ipratropium-albuterol  (DUONEB) 0.5-2.5 (3) MG/3ML nebulizer solution 3 mL (3 mLs Nebulization Given 08/26/24 0804)     IMPRESSION / MDM / ASSESSMENT AND PLAN / ED COURSE  I reviewed the triage vital signs and the nursing notes.  63 year old female with PMH as noted  above presents with worsening shortness of breath and cough.  On exam her vital signs are normal.  O2 saturation is in the high 90s on room air.  She does demonstrate somewhat increased work of breathing, diminished breath sounds, and some wheezing.  Differential diagnosis includes, but is not limited to, COPD exacerbation, acute bronchitis, pulmonary edema, pleural effusion, pneumonia, viral syndrome.  We will obtain basic labs, cardiac enzymes, respiratory panel, chest x-ray, give bronchodilators and reassess.  She was already given Solu-Medrol  by EMS.  Patient's presentation is most consistent with acute complicated illness / injury requiring diagnostic workup.  The patient is on the cardiac monitor to evaluate for evidence of arrhythmia and/or significant heart rate changes.  ----------------------------------------- 1:03 PM on 08/26/2024 -----------------------------------------  Chest x-ray shows findings consistent with COPD but no evidence of pneumonia or edema.  BMP and CBC showed no acute findings.  BNP is not elevated.  Respiratory panel is negative.  Initial troponin was minimally elevated, more consistent with very mild demand ischemia related to the patient's likely COPD exacerbation.  The patient has no chest pain or anginal symptoms, and there are no concerning EKG findings.  She is feeling much better after the steroid given by EMS and the DuoNeb here.  At this time, the patient is stable for discharge.  She feels comfortable and is ready to go home.  I have prescribed additional prednisone , albuterol , refilled her Trelegy, and have given her a small quantity of trazodone  for sleep while she is on the steroid.  I counseled her on the results of the workup and plan of care.  I gave her strict return precautions, and she expressed understanding.   FINAL CLINICAL IMPRESSION(S) / ED DIAGNOSES   Final diagnoses:  COPD exacerbation (HCC)     Rx / DC Orders   ED Discharge Orders           Ordered    predniSONE  (DELTASONE ) 50 MG tablet  Daily with breakfast        08/26/24 1302    Fluticasone -Umeclidin-Vilant (TRELEGY ELLIPTA ) 100-62.5-25 MCG/ACT AEPB  Daily        08/26/24 1302    traZODone  (DESYREL ) 50 MG tablet  At bedtime PRN        08/26/24 1302    albuterol  (VENTOLIN  HFA) 108 (90 Base) MCG/ACT inhaler  Every 4 hours PRN        08/26/24 1302             Note:  This document  was prepared using Conservation officer, historic buildings and may include unintentional dictation errors.    Jacolyn Pae, MD 08/26/24 [phone]

## 2024-08-27 LAB — COMPREHENSIVE METABOLIC PANEL WITH GFR
ALT: 59 IU/L — ABNORMAL HIGH (ref 0–32)
AST: 73 IU/L — ABNORMAL HIGH (ref 0–40)
Albumin: 4.3 g/dL (ref 3.9–4.9)
Alkaline Phosphatase: 104 IU/L (ref 49–135)
BUN/Creatinine Ratio: 14 (ref 12–28)
BUN: 10 mg/dL (ref 8–27)
Bilirubin Total: 0.2 mg/dL (ref 0.0–1.2)
CO2: 32 mmol/L — ABNORMAL HIGH (ref 20–29)
Calcium: 10.2 mg/dL (ref 8.7–10.3)
Chloride: 81 mmol/L — ABNORMAL LOW (ref 96–106)
Creatinine, Ser: 0.73 mg/dL (ref 0.57–1.00)
Globulin, Total: 2.2 g/dL (ref 1.5–4.5)
Glucose: 91 mg/dL (ref 70–99)
Potassium: 3.7 mmol/L (ref 3.5–5.2)
Sodium: 128 mmol/L — ABNORMAL LOW (ref 134–144)
Total Protein: 6.5 g/dL (ref 6.0–8.5)
eGFR: 91 mL/min/1.73 (ref >59)

## 2024-08-27 LAB — CBC WITH DIFFERENTIAL/PLATELET
Basophils Absolute: 0 x10E3/uL (ref 0.0–0.2)
Basos: 0 %
EOS (ABSOLUTE): 0 x10E3/uL (ref 0.0–0.4)
Eos: 0 %
Hematocrit: 36.8 % (ref 34.0–46.6)
Hemoglobin: 12.3 g/dL (ref 11.1–15.9)
Immature Grans (Abs): 0.1 x10E3/uL (ref 0.0–0.1)
Immature Granulocytes: 1 %
Lymphocytes Absolute: 0.8 x10E3/uL (ref 0.7–3.1)
Lymphs: 7 %
MCH: 31.6 pg (ref 26.6–33.0)
MCHC: 33.4 g/dL (ref 31.5–35.7)
MCV: 95 fL (ref 79–97)
Monocytes Absolute: 0.9 x10E3/uL (ref 0.1–0.9)
Monocytes: 8 %
Neutrophils Absolute: 9.4 x10E3/uL — ABNORMAL HIGH (ref 1.4–7.0)
Neutrophils: 84 %
Platelets: 659 x10E3/uL — ABNORMAL HIGH (ref 150–450)
RBC: 3.89 x10E6/uL (ref 3.77–5.28)
RDW: 12.4 % (ref 11.7–15.4)
WBC: 11.2 x10E3/uL — ABNORMAL HIGH (ref 3.4–10.8)

## 2024-08-27 LAB — OSMOLALITY: Osmolality Meas: 263 mosm/kg — AB (ref 280–301)

## 2024-08-27 NOTE — Telephone Encounter (Signed)
 Called and LVM asking for Damien to please return my call for verbal orders.   OK for E2C2 to speak to Damien and give verbal orders per Dr. Herold if she calls back. May need to be done by a nurse or CMA.

## 2024-08-28 LAB — SODIUM, URINE, RANDOM: Sodium, Ur: 22 mmol/L

## 2024-08-28 LAB — CREATININE, URINE, RANDOM: Creatinine, Urine: 101.2 mg/dL

## 2024-08-28 LAB — OSMOLALITY, URINE: Osmolality, Ur: 316 mosm/kg

## 2024-08-28 NOTE — Telephone Encounter (Signed)
 Called and LVM asking for Damien to please return my call for verbal orders.   OK for E2C2 to speak to Damien and give verbal orders per Dr. Herold if she calls back. May need to be done by a nurse or CMA.

## 2024-09-02 ENCOUNTER — Encounter: Payer: Self-pay | Admitting: Pediatrics

## 2024-09-02 ENCOUNTER — Ambulatory Visit: Payer: Self-pay | Admitting: Pediatrics

## 2024-09-02 ENCOUNTER — Ambulatory Visit: Admitting: Cardiovascular Disease

## 2024-09-02 MED ORDER — LORAZEPAM 0.5 MG PO TABS: 0.5000 mg | ORAL_TABLET | Freq: Every day | ORAL | 1 refills | Status: DC | PRN

## 2024-09-02 NOTE — Addendum Note (Signed)
 Addended by: Adom Schoeneck P on: 09/02/2024 11:20 AM   Modules accepted: Orders

## 2024-09-02 NOTE — Telephone Encounter (Signed)
 Called and spoke with Damien. Provided verbal orders per provider.

## 2024-09-03 ENCOUNTER — Telehealth: Payer: Self-pay | Admitting: Pediatrics

## 2024-09-03 ENCOUNTER — Other Ambulatory Visit: Payer: Self-pay | Admitting: Pediatrics

## 2024-09-03 DIAGNOSIS — F410 Panic disorder [episodic paroxysmal anxiety] without agoraphobia: Secondary | ICD-10-CM

## 2024-09-03 DIAGNOSIS — F419 Anxiety disorder, unspecified: Secondary | ICD-10-CM

## 2024-09-03 NOTE — Telephone Encounter (Unsigned)
 Copied from CRM #[id number]. Topic: General - Other >> Sep 03, 2024  8:26 AM Tobias CROME wrote: Reason for CRM: Patient requesting letter from Dr. Herold stating she is unable to walk to her mailbox. Patient is needing this so that her mail will be brought to her front door. Patient having difficulty walking to mailbox.   Patient requesting letter be mailed out to her.

## 2024-09-03 NOTE — Telephone Encounter (Signed)
 Copied from CRM #[id number]. Topic: Clinical - Lab/Test Results >> Sep 03, 2024  8:30 AM Tobias CROME wrote: Reason for CRM: Relayed results to patient. No further questions. Patient states she has been in the hospital and everything has checked out.

## 2024-09-03 NOTE — Telephone Encounter (Unsigned)
 Copied from CRM #[id number]. Topic: Clinical - Medication Refill >> Sep 03, 2024  8:27 AM Tobias L wrote: Medication: LORazepam  (ATIVAN ) 0.5 MG tablet Patient is not requesting early refill, she is aware of turnaround time which is why she is calling today. Pharmacy mails out the prescription to her and will run out by Sunday.   Has the patient contacted their pharmacy? Yes  This is the patient's preferred pharmacy:  [facility] - [place], [place] - [address] [place] [address] Phone: [phone] Fax: [phone]  Is this the correct pharmacy for this prescription? Yes  Has the prescription been filled recently? No  Is the patient out of the medication? No  Has the patient been seen for an appointment in the last year OR does the patient have an upcoming appointment? Yes  Can we respond through MyChart? No  Agent: Please be advised that Rx refills may take up to 3 business days. We ask that you follow-up with your pharmacy.

## 2024-09-03 NOTE — Telephone Encounter (Signed)
 Is this a letter that you can write for the patient please?

## 2024-09-04 ENCOUNTER — Telehealth: Payer: Self-pay | Admitting: Pediatrics

## 2024-09-04 NOTE — Telephone Encounter (Signed)
 Future order pended for 09/07/24. Called patient to confirm refill would be available on 09/07/24 at the pharmacy. Patient stated she communicated additional information during the call this morning, but information previously mentioned was not copied into original phone note.  Patient is requesting refill get done on 12/5 due to her not being able to travel, they dont deliever on saturdays so thats why she wants it sooner.      Patient states that [facility] does not make deliveries on Saturday, 12/6. Patient states she is not able to walk good to go to the pharmacy to pick up the medication. Patient states that she is currently on a walker.  Patient asking if medication can be refilled before 12/6 so that it can be delivered by the pharmacy.

## 2024-09-04 NOTE — Telephone Encounter (Unsigned)
 Copied from CRM #[id number]. Topic: Clinical - Medication Refill >> Sep 04, 2024  8:24 AM Tanazia G wrote: Medication: LORazepam  (ATIVAN ) 0.5 MG tablet  Has the patient contacted their pharmacy? Yes (Agent: If no, request that the patient contact the pharmacy for the refill. If patient does not wish to contact the pharmacy document the reason why and proceed with request.) (Agent: If yes, when and what did the pharmacy advise?)  This is the patient's preferred pharmacy:  [facility] - [place], [place] - [address] [place] [address] Phone: [phone] Fax: [phone]  Is this the correct pharmacy for this prescription? Yes If no, delete pharmacy and type the correct one.   Has the prescription been filled recently? Yes  Is the patient out of the medication? Yes  Has the patient been seen for an appointment in the last year OR does the patient have an upcoming appointment? Yes  Can we respond through MyChart? Yes  Agent: Please be advised that Rx refills may take up to 3 business days. We ask that you follow-up with your pharmacy.

## 2024-09-05 NOTE — Telephone Encounter (Signed)
 Called and notified [facility] of Dr. Ferdie message.   Called and LVM letting patient know that the pharmacy will be delivering her medication before the weekend.

## 2024-09-05 NOTE — Telephone Encounter (Signed)
 That should be fine- OK to give verbal OK to the pharmacy

## 2024-09-05 NOTE — Telephone Encounter (Signed)
 Requested medications are due for refill today.  See note from pt  Requested medications are on the active medications list.  yes  Last refill. New rx written to start on 09/07/2024 #30 1 rf  Future visit scheduled.   yes  Notes to clinic.  Per PAS: Patient is not requesting early refill, she is aware of turnaround time which is why she is calling today. Pharmacy mails out the prescription to her and will run out by Sunday.     Requested Prescriptions  Pending Prescriptions Disp Refills   LORazepam  (ATIVAN ) 0.5 MG tablet 30 tablet 1    Sig: Take 1 tablet (0.5 mg total) by mouth daily as needed. for anxiety     Not Delegated - Psychiatry: Anxiolytics/Hypnotics 2 Failed - 09/05/2024  4:39 PM      Failed - This refill cannot be delegated      Passed - Urine Drug Screen completed in last 360 days      Passed - Patient is not pregnant      Passed - Valid encounter within last 6 months    Recent Outpatient Visits           2 weeks ago COPD exacerbation (HCC)   [facility] Herold Hadassah SQUIBB, MD   2 months ago COPD with acute exacerbation [facility])   [facility] Herold Hadassah SQUIBB, MD   3 months ago Anxiety   [facility] Herold Hadassah SQUIBB, MD   5 months ago Chronic obstructive pulmonary disease with acute exacerbation [facility])   [facility] Herold Hadassah SQUIBB, MD   5 months ago COPD with acute exacerbation [facility])   [place] [facility] Valerio Melanie DASEN, NP

## 2024-09-06 ENCOUNTER — Encounter: Payer: Self-pay | Admitting: Pediatrics

## 2024-09-06 NOTE — Telephone Encounter (Signed)
 Printed and mailed

## 2024-09-09 ENCOUNTER — Telehealth: Payer: Self-pay | Admitting: Pediatrics

## 2024-09-09 NOTE — Telephone Encounter (Addendum)
 Spoke with patient. When she called she had not yet had the medication delivered. At this time she now has it and needs no refill.

## 2024-09-09 NOTE — Telephone Encounter (Unsigned)
 Copied from CRM [id number]. Topic: Clinical - Home Health Verbal Orders >> Sep 09, 2024 12:47 PM Rosaria BRAVO wrote: Ted RN from [facility] called to report that the patient does not want nursing eval or services  Best contact: [phone]

## 2024-09-27 ENCOUNTER — Other Ambulatory Visit: Payer: Self-pay | Admitting: Pediatrics

## 2024-09-27 ENCOUNTER — Telehealth: Payer: Self-pay

## 2024-09-27 ENCOUNTER — Other Ambulatory Visit: Payer: Self-pay

## 2024-09-27 DIAGNOSIS — F419 Anxiety disorder, unspecified: Secondary | ICD-10-CM

## 2024-09-27 DIAGNOSIS — F410 Panic disorder [episodic paroxysmal anxiety] without agoraphobia: Secondary | ICD-10-CM

## 2024-09-27 NOTE — Telephone Encounter (Signed)
 Copied from CRM [id number]. Topic: Clinical - Medication Refill >> Sep 27, 2024  8:17 AM [name] wrote: Medication: Fluticasone -Umeclidin-Vilant (TRELEGY ELLIPTA ) 100-62.5-25 MCG/ACT AEPB   Has the patient contacted their pharmacy? Yes (Agent: If no, request that the patient contact the pharmacy for the refill. If patient does not wish to contact the pharmacy document the reason why and proceed with request.) (Agent: If yes, when and what did the pharmacy advise?)  This is the patient's preferred pharmacy:  [facility] - [place], [place] - [address] [place] [address] Phone: [phone] Fax: [phone]  Is this the correct pharmacy for this prescription? Yes If no, delete pharmacy and type the correct one.   Has the prescription been filled recently? No  Is the patient out of the medication? Yes  Has the patient been seen for an appointment in the last year OR does the patient have an upcoming appointment? Yes  Can we respond through MyChart? Yes  Agent: Please be advised that Rx refills may take up to 3 business days. We ask that you follow-up with your pharmacy.

## 2024-09-27 NOTE — Progress Notes (Signed)
 Note created in error.

## 2024-09-27 NOTE — Telephone Encounter (Signed)
 Copied from CRM #[id number]. Topic: Clinical - Medication Question >> Sep 27, 2024  8:32 AM Montie POUR wrote: Reason for CRM:  Armetta is calling back to see why LORazepam  (ATIVAN ) 0.5 MG tablet was decrease to 1 tablet per day. Please call her at [phone] to discuss. Thanks

## 2024-09-30 ENCOUNTER — Other Ambulatory Visit: Payer: Self-pay

## 2024-09-30 NOTE — Telephone Encounter (Signed)
 Requested medication (s) are due for refill today: yes  Requested medication (s) are on the active medication list: yes  Last refill:  08/26/24  Future visit scheduled: yes  Notes to clinic:  Unable to refill per protocol, last refill by another provider.      Requested Prescriptions  Pending Prescriptions Disp Refills   Fluticasone -Umeclidin-Vilant (TRELEGY ELLIPTA ) 100-62.5-25 MCG/ACT AEPB 60 each 0    Sig: Inhale 1 puff into the lungs daily at 12 noon.     Off-Protocol Failed - 09/30/2024 12:30 PM      Failed - Medication not assigned to a protocol, review manually.      Passed - Valid encounter within last 12 months    Recent Outpatient Visits           1 month ago COPD exacerbation [facility])   [place] [facility] Herold Hadassah SQUIBB, MD   3 months ago COPD with acute exacerbation [facility])   [place] [facility] Herold Hadassah SQUIBB, MD   4 months ago Anxiety   [place] [facility] Herold Hadassah SQUIBB, MD   6 months ago Chronic obstructive pulmonary disease with acute exacerbation [facility])   [place] [facility] Herold Hadassah SQUIBB, MD   6 months ago COPD with acute exacerbation [facility])   [place] [facility] Melanie DASEN, NP

## 2024-09-30 NOTE — Telephone Encounter (Signed)
 Patient is calling to follow up on medication refill. Advised medication refills can take 3 busines days. Called refill in 09/27/24. Filled my hospitalist

## 2024-10-01 ENCOUNTER — Other Ambulatory Visit: Payer: Self-pay | Admitting: Pediatrics

## 2024-10-01 MED ORDER — TRELEGY ELLIPTA 100-62.5-25 MCG/ACT IN AEPB: 1.0000 | INHALATION_SPRAY | Freq: Every day | RESPIRATORY_TRACT | 0 refills | Status: DC

## 2024-10-01 NOTE — Telephone Encounter (Signed)
 Copied from CRM [id number]. Topic: Clinical - Prescription Issue >> Oct 01, 2024  9:15 AM Aleatha C wrote: Reason for CRM: Patient calling to check on the status of medication, she put in the refill 12/26 and it still isn't filled, been out multiple days without her inhaler

## 2024-10-06 ENCOUNTER — Other Ambulatory Visit: Payer: Self-pay

## 2024-10-06 ENCOUNTER — Observation Stay
Admission: EM | Admit: 2024-10-06 | Discharge: 2024-10-07 | Disposition: A | Discharge status: Home / Self Care | Attending: Student | Admitting: Student

## 2024-10-06 ENCOUNTER — Emergency Department

## 2024-10-06 DIAGNOSIS — R197 Diarrhea, unspecified: Secondary | ICD-10-CM | POA: Diagnosis not present

## 2024-10-06 DIAGNOSIS — F418 Other specified anxiety disorders: Secondary | ICD-10-CM | POA: Insufficient documentation

## 2024-10-06 DIAGNOSIS — E785 Hyperlipidemia, unspecified: Secondary | ICD-10-CM | POA: Diagnosis not present

## 2024-10-06 DIAGNOSIS — R531 Weakness: Secondary | ICD-10-CM | POA: Diagnosis not present

## 2024-10-06 DIAGNOSIS — F419 Anxiety disorder, unspecified: Secondary | ICD-10-CM | POA: Diagnosis not present

## 2024-10-06 DIAGNOSIS — Z79899 Other long term (current) drug therapy: Secondary | ICD-10-CM | POA: Diagnosis not present

## 2024-10-06 DIAGNOSIS — E871 Hypo-osmolality and hyponatremia: Secondary | ICD-10-CM | POA: Diagnosis not present

## 2024-10-06 DIAGNOSIS — I10 Essential (primary) hypertension: Secondary | ICD-10-CM | POA: Diagnosis not present

## 2024-10-06 DIAGNOSIS — E119 Type 2 diabetes mellitus without complications: Secondary | ICD-10-CM | POA: Insufficient documentation

## 2024-10-06 DIAGNOSIS — F32A Depression, unspecified: Secondary | ICD-10-CM | POA: Diagnosis not present

## 2024-10-06 DIAGNOSIS — K219 Gastro-esophageal reflux disease without esophagitis: Secondary | ICD-10-CM | POA: Insufficient documentation

## 2024-10-06 DIAGNOSIS — Z23 Encounter for immunization: Secondary | ICD-10-CM | POA: Diagnosis not present

## 2024-10-06 DIAGNOSIS — F410 Panic disorder [episodic paroxysmal anxiety] without agoraphobia: Secondary | ICD-10-CM

## 2024-10-06 DIAGNOSIS — J449 Chronic obstructive pulmonary disease, unspecified: Secondary | ICD-10-CM

## 2024-10-06 DIAGNOSIS — E782 Mixed hyperlipidemia: Secondary | ICD-10-CM

## 2024-10-06 DIAGNOSIS — F17210 Nicotine dependence, cigarettes, uncomplicated: Secondary | ICD-10-CM | POA: Diagnosis not present

## 2024-10-06 DIAGNOSIS — R112 Nausea with vomiting, unspecified: Secondary | ICD-10-CM | POA: Diagnosis present

## 2024-10-06 LAB — RESP PANEL BY RT-PCR (RSV, FLU A&B, COVID)  RVPGX2
Influenza A by PCR: NEGATIVE
Influenza B by PCR: NEGATIVE
Resp Syncytial Virus by PCR: NEGATIVE
SARS Coronavirus 2 by RT PCR: NEGATIVE

## 2024-10-06 LAB — CBC
HCT: 34.8 % — ABNORMAL LOW (ref 36.0–46.0)
Hemoglobin: 12.4 g/dL (ref 12.0–15.0)
MCH: 31.7 pg (ref 26.0–34.0)
MCHC: 35.6 g/dL (ref 30.0–36.0)
MCV: 89 fL (ref 80.0–100.0)
Platelets: 558 K/uL — ABNORMAL HIGH (ref 150–400)
RBC: 3.91 MIL/uL (ref 3.87–5.11)
RDW: 13 % (ref 11.5–15.5)
WBC: 6.7 K/uL (ref 4.0–10.5)
nRBC: 0 % (ref 0.0–0.2)

## 2024-10-06 LAB — URINALYSIS, ROUTINE W REFLEX MICROSCOPIC
Bilirubin Urine: NEGATIVE
Glucose, UA: NEGATIVE mg/dL
Hgb urine dipstick: NEGATIVE
Ketones, ur: NEGATIVE mg/dL
Leukocytes,Ua: NEGATIVE
Nitrite: NEGATIVE
Protein, ur: NEGATIVE mg/dL
Specific Gravity, Urine: 1.019 (ref 1.005–1.030)
pH: 7 (ref 5.0–8.0)

## 2024-10-06 LAB — TSH: TSH: 3.57 u[IU]/mL (ref 0.350–4.500)

## 2024-10-06 LAB — COMPREHENSIVE METABOLIC PANEL WITH GFR
ALT: 19 U/L (ref 0–44)
AST: 27 U/L (ref 15–41)
Albumin: 4.2 g/dL (ref 3.5–5.0)
Alkaline Phosphatase: 131 U/L — ABNORMAL HIGH (ref 38–126)
Anion gap: 13 (ref 5–15)
BUN: 6 mg/dL — ABNORMAL LOW (ref 8–23)
CO2: 22 mmol/L (ref 22–32)
Calcium: 9.4 mg/dL (ref 8.9–10.3)
Chloride: 88 mmol/L — ABNORMAL LOW (ref 98–111)
Creatinine, Ser: 0.54 mg/dL (ref 0.44–1.00)
GFR, Estimated: >60 mL/min
Glucose, Bld: 138 mg/dL — ABNORMAL HIGH (ref 70–99)
Potassium: 4.9 mmol/L (ref 3.5–5.1)
Sodium: 123 mmol/L — ABNORMAL LOW (ref 135–145)
Total Bilirubin: 0.6 mg/dL (ref 0.0–1.2)
Total Protein: 6.5 g/dL (ref 6.5–8.1)

## 2024-10-06 LAB — LIPASE, BLOOD: Lipase: 20 U/L (ref 11–51)

## 2024-10-06 LAB — OSMOLALITY, URINE: Osmolality, Ur: 252 mosm/kg — ABNORMAL LOW (ref 300–900)

## 2024-10-06 MED ORDER — IPRATROPIUM-ALBUTEROL 0.5-2.5 (3) MG/3ML IN SOLN: 3.0000 mL | Freq: Four times a day (QID) | RESPIRATORY_TRACT | Status: DC | PRN

## 2024-10-06 MED ORDER — LORAZEPAM 0.5 MG PO TABS
0.5000 mg | ORAL_TABLET | Freq: Every day | ORAL | Status: DC | PRN
  Administered 2024-10-06 – 2024-10-07 (×2): 0.5 mg via ORAL
  Filled 2024-10-06 (×2): qty 1

## 2024-10-06 MED ORDER — OXYCODONE-ACETAMINOPHEN 5-325 MG PO TABS: 1.0000 | ORAL_TABLET | Freq: Three times a day (TID) | ORAL | Status: DC | PRN

## 2024-10-06 MED ORDER — FOLIC ACID 1 MG PO TABS
1.0000 mg | ORAL_TABLET | Freq: Every day | ORAL | Status: DC
  Administered 2024-10-07: 1 mg via ORAL
  Filled 2024-10-06: qty 1

## 2024-10-06 MED ORDER — ONDANSETRON HCL 4 MG PO TABS: 4.0000 mg | ORAL_TABLET | Freq: Four times a day (QID) | ORAL | Status: DC | PRN

## 2024-10-06 MED ORDER — CITALOPRAM HYDROBROMIDE 20 MG PO TABS
40.0000 mg | ORAL_TABLET | Freq: Every day | ORAL | Status: DC
  Administered 2024-10-07: 40 mg via ORAL
  Filled 2024-10-06: qty 2

## 2024-10-06 MED ORDER — PANTOPRAZOLE SODIUM 40 MG PO TBEC
40.0000 mg | DELAYED_RELEASE_TABLET | Freq: Every day | ORAL | Status: DC
  Administered 2024-10-07: 40 mg via ORAL
  Filled 2024-10-06: qty 1

## 2024-10-06 MED ORDER — MAGNESIUM HYDROXIDE 400 MG/5ML PO SUSP: 30.0000 mL | Freq: Every day | ORAL | Status: DC | PRN

## 2024-10-06 MED ORDER — ADULT MULTIVITAMIN W/MINERALS CH
1.0000 | ORAL_TABLET | Freq: Every day | ORAL | Status: DC
  Administered 2024-10-07: 1 via ORAL
  Filled 2024-10-06: qty 1

## 2024-10-06 MED ORDER — THIAMINE MONONITRATE 100 MG PO TABS
100.0000 mg | ORAL_TABLET | Freq: Every day | ORAL | Status: DC
  Administered 2024-10-07: 100 mg via ORAL
  Filled 2024-10-06: qty 1

## 2024-10-06 MED ORDER — BUDESON-GLYCOPYRROL-FORMOTEROL 160-9-4.8 MCG/ACT IN AERO
2.0000 | INHALATION_SPRAY | Freq: Two times a day (BID) | RESPIRATORY_TRACT | Status: DC
  Administered 2024-10-07: 2 via RESPIRATORY_TRACT
  Filled 2024-10-06 (×2): qty 5.9

## 2024-10-06 MED ORDER — ACETAMINOPHEN 325 MG PO TABS
650.0000 mg | ORAL_TABLET | Freq: Four times a day (QID) | ORAL | Status: DC | PRN
  Administered 2024-10-06: 650 mg via ORAL

## 2024-10-06 MED ORDER — SODIUM CHLORIDE 0.9 % IV SOLN: INTRAVENOUS | Status: DC

## 2024-10-06 MED ORDER — IOHEXOL 300 MG/ML  SOLN
100.0000 mL | Freq: Once | INTRAMUSCULAR | Status: AC | PRN
  Administered 2024-10-06: 80 mL via INTRAVENOUS

## 2024-10-06 MED ORDER — ONDANSETRON HCL 4 MG/2ML IJ SOLN: 4.0000 mg | Freq: Four times a day (QID) | INTRAMUSCULAR | Status: DC | PRN

## 2024-10-06 MED ORDER — CYANOCOBALAMIN 500 MCG PO TABS: 1000.0000 ug | ORAL_TABLET | Freq: Every day | ORAL | Status: DC

## 2024-10-06 MED ORDER — ROSUVASTATIN CALCIUM 10 MG PO TABS
5.0000 mg | ORAL_TABLET | Freq: Every day | ORAL | Status: DC
  Administered 2024-10-07: 5 mg via ORAL
  Filled 2024-10-06: qty 1

## 2024-10-06 MED ORDER — AMLODIPINE BESYLATE 10 MG PO TABS
10.0000 mg | ORAL_TABLET | Freq: Every day | ORAL | Status: DC
  Administered 2024-10-07: 10 mg via ORAL
  Filled 2024-10-06: qty 1

## 2024-10-06 MED ORDER — INSULIN ASPART 100 UNIT/ML IJ SOLN: 0.0000 [IU] | Freq: Three times a day (TID) | INTRAMUSCULAR | Status: DC

## 2024-10-06 MED ORDER — POTASSIUM CHLORIDE CRYS ER 20 MEQ PO TBCR
20.0000 meq | EXTENDED_RELEASE_TABLET | Freq: Two times a day (BID) | ORAL | Status: DC
  Administered 2024-10-07: 20 meq via ORAL
  Filled 2024-10-06: qty 1

## 2024-10-06 MED ORDER — SODIUM CHLORIDE 0.9 % IV BOLUS
1000.0000 mL | Freq: Once | INTRAVENOUS | Status: AC
  Administered 2024-10-06: 1000 mL via INTRAVENOUS

## 2024-10-06 MED ORDER — ONDANSETRON HCL 4 MG/2ML IJ SOLN
4.0000 mg | Freq: Once | INTRAMUSCULAR | Status: AC
  Administered 2024-10-06: 4 mg via INTRAVENOUS
  Filled 2024-10-06: qty 2

## 2024-10-06 MED ORDER — METOCLOPRAMIDE HCL 5 MG/ML IJ SOLN
10.0000 mg | Freq: Once | INTRAMUSCULAR | Status: AC
  Administered 2024-10-06: 10 mg via INTRAVENOUS
  Filled 2024-10-06: qty 2

## 2024-10-06 MED ORDER — ENOXAPARIN SODIUM 40 MG/0.4ML IJ SOSY
40.0000 mg | PREFILLED_SYRINGE | INTRAMUSCULAR | Status: DC
  Administered 2024-10-06: 40 mg via SUBCUTANEOUS
  Filled 2024-10-06: qty 0.4

## 2024-10-06 MED ORDER — ACETAMINOPHEN 650 MG RE SUPP: 650.0000 mg | Freq: Four times a day (QID) | RECTAL | Status: DC | PRN

## 2024-10-06 MED ORDER — INSULIN ASPART 100 UNIT/ML IJ SOLN: 0.0000 [IU] | Freq: Every day | INTRAMUSCULAR | Status: DC

## 2024-10-06 MED ORDER — HYDRALAZINE HCL 20 MG/ML IJ SOLN: 10.0000 mg | Freq: Four times a day (QID) | INTRAMUSCULAR | Status: DC | PRN

## 2024-10-06 MED ORDER — TRAZODONE HCL 50 MG PO TABS: 50.0000 mg | ORAL_TABLET | Freq: Every evening | ORAL | Status: DC | PRN

## 2024-10-06 NOTE — Assessment & Plan Note (Signed)
-   Will continue her inhalers.

## 2024-10-06 NOTE — ED Provider Notes (Signed)
 "  [facility] Provider Note    Event Date/Time   First MD Initiated Contact with Patient 10/06/24 1657     (approximate)   History   Emesis   HPI  Christine Sparks is a 64 y.o. female with PMH of anxiety, hypertension, diabetes, COPD, NSTEMI, anemia and depression who presents for evaluation of vomiting.  Patient states that she has had nausea vomiting and diarrhea that began yesterday.  She reports she has had minimal oral intake and is concerned that she is dehydrated.  She is unsure if she has had a fever.  She endorses some sweats but no chills.  No urinary symptoms.  She denies abdominal pain.      Physical Exam   Triage Vital Signs: ED Triage Vitals  Encounter Vitals Group     BP 10/06/24 1525 (!) 165/78     Girls Systolic BP Percentile --      Girls Diastolic BP Percentile --      Boys Systolic BP Percentile --      Boys Diastolic BP Percentile --      Pulse Rate 10/06/24 1525 86     Resp 10/06/24 1525 18     Temp 10/06/24 1525 98 F (36.7 C)     Temp src --      SpO2 10/06/24 1525 100 %     Weight 10/06/24 1524 121 lb (54.9 kg)     Height 10/06/24 1524 5' 5 (1.651 m)     Head Circumference --      Peak Flow --      Pain Score 10/06/24 1524 0     Pain Loc --      Pain Education --      Exclude from Growth Chart --     Most recent vital signs: Vitals:   10/06/24 1525 10/06/24 1918  BP: (!) 165/78 (!) 154/73  Pulse: 86 79  Resp: 18 17  Temp: 98 F (36.7 C) 98.3 F (36.8 C)  SpO2: 100% 97%   General: Awake, no distress.  CV:  Good peripheral perfusion.  RRR. Resp:  Normal effort.  CTAB Abd:  No distention.  Very tender to palpation across the upper abdomen with guarding, otherwise lower abdomen nontender Other:     ED Results / Procedures / Treatments   Labs (all labs ordered are listed, but only abnormal results are displayed) Labs Reviewed  COMPREHENSIVE METABOLIC PANEL WITH GFR - Abnormal; Notable for the following  components:      Result Value   Sodium 123 (*)    Chloride 88 (*)    Glucose, Bld 138 (*)    BUN 6 (*)    Alkaline Phosphatase 131 (*)    All other components within normal limits  CBC - Abnormal; Notable for the following components:   HCT 34.8 (*)    Platelets 558 (*)    All other components within normal limits  URINALYSIS, ROUTINE W REFLEX MICROSCOPIC - Abnormal; Notable for the following components:   Color, Urine STRAW (*)    APPearance CLEAR (*)    All other components within normal limits  RESP PANEL BY RT-PCR (RSV, FLU A&B, COVID)  RVPGX2  LIPASE, BLOOD  HIV ANTIBODY (ROUTINE TESTING W REFLEX)  BASIC METABOLIC PANEL WITH GFR  CBC  NA AND K (SODIUM & POTASSIUM), RAND UR  OSMOLALITY, URINE  OSMOLALITY  TSH   RADIOLOGY  CT abdomen pelvis obtained, interpreted the images as well as reviewed the radiologist  report.  See ED course for interpretation.  PROCEDURES:  Critical Care performed: No  Procedures   MEDICATIONS ORDERED IN ED: Medications  oxyCODONE -acetaminophen  (PERCOCET/ROXICET) 5-325 MG per tablet 1 tablet (has no administration in time range)  amLODipine  (NORVASC ) tablet 10 mg (has no administration in time range)  rosuvastatin  (CRESTOR ) tablet 5 mg (has no administration in time range)  citalopram  (CELEXA ) tablet 40 mg (has no administration in time range)  LORazepam  (ATIVAN ) tablet 0.5 mg (has no administration in time range)  traZODone  (DESYREL ) tablet 50 mg (has no administration in time range)  pantoprazole  (PROTONIX ) EC tablet 40 mg (has no administration in time range)  cyanocobalamin  (VITAMIN B12) tablet 1,000 mcg (has no administration in time range)  folic acid  (FOLVITE ) tablet 1 mg (has no administration in time range)  multivitamin with minerals tablet 1 tablet (has no administration in time range)  potassium chloride  SA (KLOR-CON  M) CR tablet 20 mEq (has no administration in time range)  thiamine  (VITAMIN B1) tablet 100 mg (has no  administration in time range)  budesonide -glycopyrrolate -formoterol  (BREZTRI ) 160-9-4.8 MCG/ACT inhaler 2 puff (has no administration in time range)  ipratropium-albuterol  (DUONEB) 0.5-2.5 (3) MG/3ML nebulizer solution 3 mL (has no administration in time range)  enoxaparin  (LOVENOX ) injection 40 mg (has no administration in time range)  0.9 %  sodium chloride  infusion (has no administration in time range)  acetaminophen  (TYLENOL ) tablet 650 mg (has no administration in time range)    Or  acetaminophen  (TYLENOL ) suppository 650 mg (has no administration in time range)  magnesium  hydroxide (MILK OF MAGNESIA) suspension 30 mL (has no administration in time range)  ondansetron  (ZOFRAN ) tablet 4 mg (has no administration in time range)    Or  ondansetron  (ZOFRAN ) injection 4 mg (has no administration in time range)  sodium chloride  0.9 % bolus 1,000 mL (1,000 mLs Intravenous New Bag/Given 10/06/24 1735)  ondansetron  (ZOFRAN ) injection 4 mg (4 mg Intravenous Given 10/06/24 1733)  iohexol  (OMNIPAQUE ) 300 MG/ML solution 100 mL (80 mLs Intravenous Contrast Given 10/06/24 1752)  metoCLOPramide  (REGLAN ) injection 10 mg (10 mg Intravenous Given 10/06/24 1914)     IMPRESSION / MDM / ASSESSMENT AND PLAN / ED COURSE  I reviewed the triage vital signs and the nursing notes.                             64 year old female presents for evaluation of N/V/D.  Blood pressure is elevated otherwise vital signs are stable.  Patient uncomfortable appearing on exam.  Differential diagnosis includes, but is not limited to, viral gastroenteritis, biliary disease, pancreatitis, diverticulitis, UTI, pyelonephritis, nephrolithiasis.  Patient's presentation is most consistent with acute complicated illness / injury requiring diagnostic workup.  Labs obtained from triage.  CBC shows elevated platelets otherwise unremarkable.  Lipase within normal limits.  CMP notable for hyponatremia, hypochloremia and slightly elevated alk  phos.  Patient has not urinated yet.  Given the electrolyte derangement suspect she is dehydrated and will give patient IV fluids and nausea medication.  Patient was very tender to palpation across the upper abdomen.  We will get CT scan as well.  Patient expresses concern about going home.  She reports that she feels very weak and unsteady on her feet.  She is worried that she will not be safe because she does not have anyone who lives with her to help her.  I did attempt to get patient up to walk and she is quite  unsteady.  She is able to stand at the bedside but cannot take more than a couple steps.  I am concerned that if she were to be discharged she would have a fall but ended up back in the emergency department.  Suspect that her weakness is due to the dehydration and hyponatremia.  Will plan to admit.  Hospitalist in agreement.  Clinical Course as of 10/06/24 1939  [place] Oct 06, 2024  1834 Resp panel by RT-PCR (RSV, Flu A&B, Covid) Anterior Nasal Swab Negative. [LD]  1834 Urinalysis, Routine w reflex microscopic -Urine, Random(!) Normal, without signs of infection. 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 [LD]    Clinical Course User Index [LD] Cleaster Tinnie LABOR, PA-C     FINAL CLINICAL IMPRESSION(S) / ED DIAGNOSES   Final diagnoses:  Hyponatremia  Nausea vomiting and diarrhea  Weakness     Rx / DC Orders   ED Discharge Orders     None        Note:  This document was prepared using Dragon voice recognition software and may include unintentional dictation errors.   Cleaster Tinnie LABOR, PA-C 10/06/24 ULYESS Jacolyn Pae, MD 10/06/24 2009  "

## 2024-10-06 NOTE — Assessment & Plan Note (Signed)
-   Will continue PPI therapy.

## 2024-10-06 NOTE — Assessment & Plan Note (Signed)
-   Will continue Ativan  and trazodone .

## 2024-10-06 NOTE — ED Notes (Signed)
 Assisted the pt bathroom

## 2024-10-06 NOTE — Assessment & Plan Note (Addendum)
-   The patient will be admitted to a telemetry bed. - This is likely hypovolemic due to intractable nausea and vomiting and dehydration. - She will be continued on hydration with IV normal saline. - Antiemetics will be provided as well as IV PPI therapy for the possibility of underlying gastritis. - Will still do p.o. fluid restriction. - Will obtain hyponatremia workup. - Will follow serial sodium levels. - I will hold off diuretic therapy at this time.

## 2024-10-06 NOTE — H&P (Addendum)
 "     Christine Sparks   PATIENT NAME: Christine Sparks    MR#:  [id number]  DATE OF BIRTH:  04-Apr-1961  DATE OF ADMISSION:  10/06/2024  PRIMARY CARE PHYSICIAN: Herold Hadassah SQUIBB, MD   Patient is coming from: Home  REQUESTING/REFERRING PHYSICIAN: Cleaster Tinnie LABOR, PA-C    CHIEF COMPLAINT:   Chief Complaint  Patient presents with   Emesis    HISTORY OF PRESENT ILLNESS:  Christine Sparks is a 64 y.o. Caucasian female with medical history significant for osteoarthritis, COPD, type 2 diabetes mellitus dyslipidemia, depression, anemia, hypertension and coronary artery disease, who presented to the emergency room with acute onset of intractable nausea and vomiting since yesterday.  He has been having associated diarrhea.  She admitted to significantly diminished p.o. intake since then.  She was unsure if she had a fever but admitted to diaphoresis without chills.  She denies any abdominal pain or melena or bright red bleeding per rectum.  No bilious vomitus or hematemesis.  No dysuria, oliguria or hematuria or flank pain.  No chest pain or palpitations.  No cough or wheezing or dyspnea.  ED Course: When she came to the ER, BP was 165/78 with otherwise normal vital signs.  Labs revealed hyponatremia 123 and hypochloremia of 88 with a blood glucose of 138.  Alk phos was 131 and otherwise CMP was within normal.  CBC was unremarkable.  Respiratory panel came back negative.  UA was negative. EKG as reviewed by me : None.  Imaging: Abdominal pelvic CT scan with contrast revealed the following: 1. 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 2. No acute abnormality seen in the abdomen or pelvis. 3. Aortic atherosclerosis.  The patient was given 1 L bolus of IV normal saline, 4 mg of IV Zofran  and 10 mg of IV Reglan .  She will be admitted to a medical telemetry bed for further evaluation and management.  PAST MEDICAL HISTORY:   Past Medical History:  Diagnosis Date   Acute respiratory failure  with hypoxia (HCC) 09/19/2023   Anemia    Anxiety    Arthritis    Bronchitis, chronic obstructive (HCC)    COPD exacerbation (HCC) 06/20/2023   Depression    Diabetes mellitus 10/2010   a1c 5.7, fasting glu 155   Dyspnea    with exertion   H/O bronchitis    Heart murmur    High cholesterol    Hypertension    Menopausal syndrome    NSTEMI (non-ST elevated myocardial infarction) (HCC) 09/19/2023   Tobacco abuse     PAST SURGICAL HISTORY:   Past Surgical History:  Procedure Laterality Date   BREAST BIOPSY Left 07/26/2016   complex sclerosing lesion. Coil shape marker   BREAST LUMPECTOMY Left 08/17/2016   complex sclerosing lesion with focal ADH. Clear margins. The coil shaped biopsy marker remains    BREAST LUMPECTOMY WITH NEEDLE LOCALIZATION Left 08/17/2016   Procedure: BREAST LUMPECTOMY WITH NEEDLE LOCALIZATION;  Surgeon: Louanne KANDICE Muse, MD;  Location: [facility] ORS;  Service: General;  Laterality: Left;   CERVICAL CONE BIOPSY     DILATION AND CURETTAGE OF UTERUS     EXCISION / BIOPSY BREAST / NIPPLE / DUCT Left 08/17/2016   complex sclerosing lesion removed   LIPOMA EXCISION Right    benign tumor right leg   LYMPH NODE BIOPSY     TONSILLECTOMY      SOCIAL HISTORY:   Social History   Tobacco Use   Smoking status: Every  Day    Average packs/day: 0.5 packs/day for 45.1 years (22.6 ttl pk-yrs)    Types: Cigarettes    Start date: 10/03/1978    Passive exposure: Past   Smokeless tobacco: Never  Substance Use Topics   Alcohol use: Yes    Alcohol/week: 6.0 standard drinks of alcohol    Types: 6 Glasses of wine per week    Comment: daily    FAMILY HISTORY:   Family History  Problem Relation Age of Onset   Cancer Mother        melanoma   Cancer Father        Lung Cancer   Early death Brother    Heart disease Brother    Breast cancer Paternal Aunt 72   Colon cancer Other    Ovarian cancer Neg Hx    Diabetes Neg Hx     DRUG ALLERGIES:   Allergies[1]  REVIEW OF SYSTEMS:   ROS As per history of present illness. All pertinent systems were reviewed above. Constitutional, HEENT, cardiovascular, respiratory, GI, GU, musculoskeletal, neuro, psychiatric, endocrine, integumentary and hematologic systems were reviewed and are otherwise negative/unremarkable except for positive findings mentioned above in the HPI.   MEDICATIONS AT HOME:   Prior to Admission medications  Medication Sig Start Date End Date Taking? Authorizing Provider  albuterol  (VENTOLIN  HFA) 108 (90 Base) MCG/ACT inhaler Inhale 2 puffs into the lungs every 6 (six) hours as needed for shortness of breath. 06/14/24 06/14/25  Herold Hadassah SQUIBB, MD  albuterol  (VENTOLIN  HFA) 108 [phone] Base) MCG/ACT inhaler Inhale 2 puffs into the lungs every 4 (four) hours as needed. 08/26/24   Jacolyn Pae, MD  amLODipine  (NORVASC ) 10 MG tablet Take 1 tablet (10 mg total) by mouth daily. 08/01/24   Alexander, Natalie, DO  citalopram  (CELEXA ) 40 MG tablet Take 1 tablet (40 mg total) by mouth daily. 08/20/24   Herold Hadassah SQUIBB, MD  cyanocobalamin  1000 MCG tablet Take 1 tablet (1,000 mcg total) by mouth daily. 08/01/24   Alexander, Natalie, DO  famotidine  (PEPCID ) 20 MG tablet Take 1 tablet (20 mg total) by mouth 2 (two) times daily. 06/20/24   Herold Hadassah SQUIBB, MD  Fluticasone -Umeclidin-Vilant (TRELEGY ELLIPTA ) 100-62.5-25 MCG/ACT AEPB Inhale 1 puff into the lungs daily at 12 noon. 10/01/24   Herold Hadassah SQUIBB, MD  folic acid  (FOLVITE ) 1 MG tablet Take 1 tablet (1 mg total) by mouth daily. 08/01/24   Alexander, Natalie, DO  furosemide  (LASIX ) 40 MG tablet Take 1 tablet (40 mg total) by mouth 2 (two) times daily. 08/01/24   Alexander, Natalie, DO  ipratropium-albuterol  (DUONEB) 0.5-2.5 (3) MG/3ML SOLN Take 3 mLs by nebulization every 6 (six) hours as needed. 02/07/24   Herold Hadassah SQUIBB, MD  LORazepam  (ATIVAN ) 0.5 MG tablet Take 1 tablet (0.5 mg total) by mouth daily as needed. for anxiety 09/07/24    Herold Hadassah SQUIBB, MD  methylPREDNISolone  (MEDROL  DOSEPAK) 4 MG TBPK tablet Follow instructions in packaging 08/20/24   Herold Hadassah SQUIBB, MD  Multiple Vitamin (MULTIVITAMIN WITH MINERALS) TABS tablet Take 1 tablet by mouth daily. 08/01/24   Alexander, Natalie, DO  nicotine  (NICOTINE  STEP 3) 7 mg/24hr patch Place 1 patch (7 mg total) onto the skin daily. 08/20/24   Herold Hadassah SQUIBB, MD  oxyCODONE -acetaminophen  (PERCOCET/ROXICET) 5-325 MG tablet Take 1 tablet by mouth every 8 (eight) hours as needed for severe pain (pain score 7-10). 08/01/24   Alexander, Natalie, DO  pantoprazole  (PROTONIX ) 40 MG tablet Take 1 tablet (40 mg total) by mouth  daily. 08/01/24   Alexander, Natalie, DO  potassium chloride  SA (KLOR-CON  M) 20 MEQ tablet Take 1 tablet (20 mEq total) by mouth 2 (two) times daily. 08/01/24   Alexander, Natalie, DO  rosuvastatin  (CRESTOR ) 5 MG tablet Take 1 tablet (5 mg total) by mouth daily. 08/01/24 08/01/25  Alexander, Natalie, DO  thiamine  (VITAMIN B-1) 100 MG tablet Take 1 tablet (100 mg total) by mouth daily. 08/01/24   Alexander, Natalie, DO  traZODone  (DESYREL ) 50 MG tablet Take 1 tablet (50 mg total) by mouth at bedtime as needed for up to 7 days for sleep. 08/26/24 09/02/24  Jacolyn Pae, MD      VITAL SIGNS:  Blood pressure (!) 154/73, pulse 79, temperature 98.3 F (36.8 C), temperature source Oral, resp. rate 17, height 5' 5 (1.651 m), weight 54.9 kg, SpO2 97%.  PHYSICAL EXAMINATION:  Physical Exam  GENERAL:  64 y.o.-year-old Caucasian female patient lying in the bed with no acute distress.  EYES: Pupils equal, round, reactive to light and accommodation. No scleral icterus. Extraocular muscles intact.  HEENT: Head atraumatic, normocephalic. Oropharynx with slightly dry mucous membrane and tongue and nasopharynx clear.  NECK:  Supple, no jugular venous distention. No thyroid  enlargement, no tenderness.  LUNGS: Normal breath sounds bilaterally, no wheezing, rales,rhonchi or  crepitation. No use of accessory muscles of respiration.  CARDIOVASCULAR: Regular rate and rhythm, S1, S2 normal. No murmurs, rubs, or gallops.  ABDOMEN: Soft, nondistended, with mild epigastric tenderness without rebound tenderness guarding or rigidity. Bowel sounds present. No organomegaly or mass.  EXTREMITIES: No pedal edema, cyanosis, or clubbing.  NEUROLOGIC: Cranial nerves II through XII are intact. Muscle strength 5/5 in all extremities. Sensation intact. Gait not checked.  PSYCHIATRIC: The patient is alert and oriented x 3.  Normal affect and good eye contact. SKIN: No obvious rash, lesion, or ulcer.   LABORATORY PANEL:   CBC Recent Labs  Lab 10/06/24 1525  WBC 6.7  HGB 12.4  HCT 34.8*  PLT 558*   ------------------------------------------------------------------------------------------------------------------  Chemistries  Recent Labs  Lab 10/06/24 1525  NA 123*  K 4.9  CL 88*  CO2 22  GLUCOSE 138*  BUN 6*  CREATININE 0.54  CALCIUM  9.4  AST 27  ALT 19  ALKPHOS 131*  BILITOT 0.6   ------------------------------------------------------------------------------------------------------------------  Cardiac Enzymes No results for input(s): TROPONINI in the last 168 hours. ------------------------------------------------------------------------------------------------------------------  RADIOLOGY:  CT ABDOMEN PELVIS W CONTRAST Result Date: 10/06/2024 CLINICAL DATA:  Acute nausea, vomiting and diarrhea. EXAM: CT ABDOMEN AND PELVIS WITH CONTRAST TECHNIQUE: Multidetector CT imaging of the abdomen and pelvis was performed using the standard protocol following bolus administration of intravenous contrast. RADIATION DOSE REDUCTION: This exam was performed according to the departmental dose-optimization program which includes automated exposure control, adjustment of the mA and/or kV according to patient size and/or use of iterative reconstruction technique. CONTRAST:  80mL  OMNIPAQUE  IOHEXOL  300 MG/ML  SOLN COMPARISON:  July 24, 2024. FINDINGS: Lower chest: No acute abnormality. Hepatobiliary: No focal liver abnormality is seen. No gallstones, gallbladder wall thickening, or biliary dilatation. Pancreas: Unremarkable. No pancreatic ductal dilatation or surrounding inflammatory changes. Spleen: Normal in size without focal abnormality. Adrenals/Urinary Tract: Adrenal glands are unremarkable. Kidneys are normal, without renal calculi, focal lesion, or hydronephrosis. Bladder is unremarkable. Stomach/Bowel: Stomach is within normal limits. Appendix appears normal. No evidence of bowel wall thickening, distention, or inflammatory changes. Vascular/Lymphatic: Aortic atherosclerosis. No enlarged abdominal or pelvic lymph nodes. Reproductive: Uterus and bilateral adnexa are unremarkable. Other: No abdominal wall hernia  or abnormality. No abdominopelvic ascites. Musculoskeletal: Possible avascular necrosis seen in right femoral head. IMPRESSION: 1. 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 2. No acute abnormality seen in the abdomen or pelvis. 3. Aortic atherosclerosis. Aortic Atherosclerosis (ICD10-I70.0). Electronically Signed   By: Lynwood Landy Raddle M.D.   On: 10/06/2024 18:11      IMPRESSION AND PLAN:  Assessment and Plan: * Hyponatremia - The patient will be admitted to a telemetry bed. - This is likely hypovolemic due to intractable nausea and vomiting and dehydration. - She will be continued on hydration with IV normal saline. - Antiemetics will be provided as well as IV PPI therapy for the possibility of underlying gastritis. - Will still do p.o. fluid restriction. - Will obtain hyponatremia workup. - Will follow serial sodium levels. - I will hold off diuretic therapy at this time.  Essential hypertension - Will continue antihypertensive therapy.  Dyslipidemia - Will continue statin therapy.  Chronic obstructive pulmonary disease (COPD) (HCC) -  Will continue her inhalers.  GERD without esophagitis - Will continue PPI therapy.  Anxiety and depression - Will continue Ativan  and trazodone .   DVT prophylaxis: Lovenox . Advanced Care Planning:  Code Status: full code. Family Communication:  The plan of care was discussed in details with the patient (and family). I answered all questions. The patient agreed to proceed with the above mentioned plan. Further management will depend upon hospital course. Disposition Plan: Back to previous home environment Consults called: none. All the records are reviewed and case discussed with ED provider.  Status is: Inpatient  At the time of the admission, it appears that the appropriate admission status for this patient is inpatient.  This is judged to be reasonable and necessary in order to provide the required intensity of service to ensure the patient's safety given the presenting symptoms, physical exam findings and initial radiographic and laboratory data in the context of comorbid conditions.  The patient requires inpatient status due to high intensity of service, high risk of further deterioration and high frequency of surveillance required.  I certify that at the time of admission, it is my clinical judgment that the patient will require inpatient hospital care extending more than 2 midnights.                            Dispo: The patient is from: Home              Anticipated d/c is to: Home              Patient currently is not medically stable to d/c.              Difficult to place patient: No  Madison DELENA Peaches M.D on 10/06/2024 at 7:58 PM  [facility]   From 7 PM-7 AM, contact night-coverage [email or web address]  CC: Primary care physician; Herold Hadassah SQUIBB, MD     [1]  Allergies Allergen Reactions   Zithromax  [Azithromycin ] Nausea And Vomiting   Quetiapine  Other (See Comments)    nightmares   Buspar  [Buspirone ] Other (See Comments)    Made her feel crazy   "

## 2024-10-06 NOTE — ED Triage Notes (Signed)
 Pt comes via EMS from home with N/V/D sin e yesterday. Pt stats no appetite and might be dehydrated. Pt denies any belly pain. Pt vomited twice with ems on way here.  CBG 122

## 2024-10-06 NOTE — Assessment & Plan Note (Signed)
-   Will continue antihypertensive therapy.

## 2024-10-06 NOTE — Assessment & Plan Note (Signed)
 Will continue statin therapy

## 2024-10-07 DIAGNOSIS — E871 Hypo-osmolality and hyponatremia: Secondary | ICD-10-CM | POA: Diagnosis not present

## 2024-10-07 LAB — BASIC METABOLIC PANEL WITH GFR
Anion gap: 12 (ref 5–15)
BUN: 6 mg/dL — ABNORMAL LOW (ref 8–23)
CO2: 24 mmol/L (ref 22–32)
Calcium: 8.3 mg/dL — ABNORMAL LOW (ref 8.9–10.3)
Chloride: 95 mmol/L — ABNORMAL LOW (ref 98–111)
Creatinine, Ser: 0.5 mg/dL (ref 0.44–1.00)
GFR, Estimated: >60 mL/min
Glucose, Bld: 107 mg/dL — ABNORMAL HIGH (ref 70–99)
Potassium: 4.3 mmol/L (ref 3.5–5.1)
Sodium: 131 mmol/L — ABNORMAL LOW (ref 135–145)

## 2024-10-07 LAB — HEMOGLOBIN A1C
Hgb A1c MFr Bld: 5.1 % (ref 4.8–5.6)
Mean Plasma Glucose: 99.67 mg/dL

## 2024-10-07 LAB — VITAMIN B12: Vitamin B-12: 606 pg/mL (ref 180–914)

## 2024-10-07 LAB — CBC
HCT: 31.4 % — ABNORMAL LOW (ref 36.0–46.0)
Hemoglobin: 10.9 g/dL — ABNORMAL LOW (ref 12.0–15.0)
MCH: 31.4 pg (ref 26.0–34.0)
MCHC: 34.7 g/dL (ref 30.0–36.0)
MCV: 90.5 fL (ref 80.0–100.0)
Platelets: 490 K/uL — ABNORMAL HIGH (ref 150–400)
RBC: 3.47 MIL/uL — ABNORMAL LOW (ref 3.87–5.11)
RDW: 13 % (ref 11.5–15.5)
WBC: 7.7 K/uL (ref 4.0–10.5)
nRBC: 0 % (ref 0.0–0.2)

## 2024-10-07 LAB — NA AND K (SODIUM & POTASSIUM), RAND UR
Potassium Urine: 48 mmol/L
Sodium, Ur: <30 mmol/L

## 2024-10-07 LAB — OSMOLALITY: Osmolality: 265 mosm/kg — ABNORMAL LOW (ref 275–295)

## 2024-10-07 LAB — GLUCOSE, CAPILLARY: Glucose-Capillary: 107 mg/dL — ABNORMAL HIGH (ref 70–99)

## 2024-10-07 LAB — PHOSPHORUS: Phosphorus: 4.1 mg/dL (ref 2.5–4.6)

## 2024-10-07 LAB — HIV ANTIBODY (ROUTINE TESTING W REFLEX): HIV Screen 4th Generation wRfx: NONREACTIVE

## 2024-10-07 LAB — MAGNESIUM: Magnesium: 1.9 mg/dL (ref 1.7–2.4)

## 2024-10-07 MED ORDER — TRAZODONE HCL 50 MG PO TABS: 50.0000 mg | ORAL_TABLET | Freq: Every evening | ORAL | 0 refills | Status: DC | PRN

## 2024-10-07 MED ORDER — INFLUENZA VAC SPLIT HIGH-DOSE 0.5 ML IM SUSY: 0.5000 mL | PREFILLED_SYRINGE | INTRAMUSCULAR | Status: DC

## 2024-10-07 MED ORDER — INFLUENZA VAC SPLIT HIGH-DOSE 0.5 ML IM SUSY
0.5000 mL | PREFILLED_SYRINGE | Freq: Once | INTRAMUSCULAR | Status: AC
  Administered 2024-10-07: 0.5 mL via INTRAMUSCULAR
  Filled 2024-10-07: qty 0.5

## 2024-10-07 MED ORDER — AMLODIPINE BESYLATE 5 MG PO TABS: 10.0000 mg | ORAL_TABLET | Freq: Every day | ORAL | 5 refills | Status: DC

## 2024-10-07 MED ORDER — ENSURE PLUS HIGH PROTEIN PO LIQD
237.0000 mL | Freq: Two times a day (BID) | ORAL | Status: DC
  Administered 2024-10-07: 237 mL via ORAL

## 2024-10-07 NOTE — Discharge Summary (Signed)
 [facility] Discharge Summary   Patient: Christine Sparks  PCP: Herold Hadassah SQUIBB, MD  Date of admission: 10/06/2024   Date of discharge:  10/07/2024     Discharge Diagnoses:  Principal Problem:   Hyponatremia Active Problems:   Essential hypertension   Dyslipidemia   Chronic obstructive pulmonary disease (COPD) (HCC)   Anxiety and depression   GERD without esophagitis   Admitted From: Home Disposition:  Home   Recommendations for Outpatient Follow-up:  PCP: in 1 wk Follow up LABS/TEST:  BMP in 1 wk   Follow-up Information     Herold Hadassah SQUIBB, MD Follow up.   Specialty: Family Medicine Why: hospital follow up Contact information: [address] [place] [address] [phone]                Diet recommendation: Cardiac diet  Activity: The patient is advised to gradually reintroduce usual activities, as tolerated  Discharge Condition: stable  Code Status: Full code   History of present illness: As per the H and P dictated on admission.  Hospital Course:  Christine Sparks is a 64 y.o. Caucasian female with medical history significant for osteoarthritis, COPD, type 2 diabetes mellitus dyslipidemia, depression, anemia, hypertension and coronary artery disease, who presented to the emergency room with acute onset of intractable nausea and vomiting since yesterday.  He has been having associated diarrhea.  She admitted to significantly diminished p.o. intake since then.  She was unsure if she had a fever but admitted to diaphoresis without chills.  She denies any abdominal pain or melena or bright red bleeding per rectum.  No bilious vomitus or hematemesis.  No dysuria, oliguria or hematuria or flank pain.  No chest pain or palpitations.  No cough or wheezing or dyspnea.   ED Course: When she came to the ER, BP was 165/78 with otherwise normal vital signs.  Labs revealed hyponatremia 123 and hypochloremia of 88 with a blood glucose of 138.  Alk phos was 131 and  otherwise CMP was within normal.  CBC was unremarkable.  Respiratory panel came back negative.  UA was negative. EKG as reviewed by me : None.   Imaging: Abdominal pelvic CT scan with contrast revealed the following: 1. 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 2. No acute abnormality seen in the abdomen or pelvis. 3. Aortic atherosclerosis.   The patient was given 1 L bolus of IV normal saline, 4 mg of IV Zofran  and 10 mg of IV Reglan .  She will be admitted to a medical telemetry bed for further evaluation and management.  Assessment and Plan:   # Hyponatremia This is likely hypovolemic due to intractable nausea and vomiting and dehydration. S/p IVF, Na 123>>131 improved after IV fluids. Patient denies any nausea and vomiting.  No diarrhea.  Patient is tolerating diet well and patient requested to be discharged home.  Patient was advised to stay 1 more day to make sure her electrolytes remained stable but she stated that she is feeling fine and would like to go home.  So patient was discharged home, recommended to follow-up with PCP in 1 week.  Repeat BMP in 1 week   # Essential hypertension: Continued amlodipine  and ramipril  home dose # Dyslipidemia: continue statin therapy. # Chronic obstructive pulmonary disease (COPD): No exacerbation noticed.  Continued Trelegy Ellipta  and albuterol /DuoNeb. # GERD without esophagitis: Resumed home dose Pepcid  # Anxiety and depression: Continued home meds Celexa  trazodone  and Ativan .  Body mass index is  20.14 kg/m.  Nutrition Interventions:   - Patient was instructed, not to drive, operate heavy machinery, perform activities at heights, swimming or participation in water activities or provide baby sitting services while on Pain, Sleep and Anxiety Medications; until her outpatient Physician has advised to do so again.  - Also recommended to not to take more than prescribed Pain, Sleep and Anxiety Medications.  Patient was ambulatory  without any assistance. On the day of the discharge the patient's vitals were stable, and no other acute medical condition were reported by patient. the patient was felt safe to be discharge at Home.  Consultants: None Procedures: None  Discharge Exam: General: Appear in no distress, Oral Mucosa Clear, moist. Cardiovascular: S1 and S2 Present, no Murmur, Respiratory: normal respiratory effort, Bilateral Air entry present and no Crackles, no wheezes Abdomen: Bowel Sound present, Soft and no tenderness. Extremities: no Pedal edema, no calf tenderness Neurology: alert and oriented to time, place, and person affect appropriate.  Filed Weights   10/06/24 1524  Weight: 54.9 kg   Vitals:   10/07/24 0816 10/07/24 1243  BP: (!) 148/68 122/66  Pulse: 65 84  Resp: 17   Temp: 98.6 F (37 C) 97.7 F (36.5 C)  SpO2: 98% 99%    DISCHARGE MEDICATION: Allergies as of 10/07/2024       Reactions   Zithromax  [azithromycin ] Nausea And Vomiting   Quetiapine  Other (See Comments)   nightmares   Buspar  [buspirone ] Other (See Comments)   Made her feel crazy        Medication List     STOP taking these medications    cyanocobalamin  1000 MCG tablet Commonly known as: VITAMIN B12   folic acid  1 MG tablet Commonly known as: FOLVITE    furosemide  40 MG tablet Commonly known as: LASIX    methylPREDNISolone  4 MG Tbpk tablet Commonly known as: MEDROL  DOSEPAK   nicotine  7 mg/24hr patch Commonly known as: Nicotine  Step 3   oxyCODONE -acetaminophen  5-325 MG tablet Commonly known as: PERCOCET/ROXICET   pantoprazole  40 MG tablet Commonly known as: PROTONIX    potassium chloride  SA 20 MEQ tablet Commonly known as: KLOR-CON  M   thiamine  100 MG tablet Commonly known as: VITAMIN B1       TAKE these medications    albuterol  108 (90 Base) MCG/ACT inhaler Commonly known as: VENTOLIN  HFA Inhale 2 puffs into the lungs every 6 (six) hours as needed for shortness of breath.   amLODipine   5 MG tablet Commonly known as: NORVASC  Take 2 tablets (10 mg total) by mouth daily. Hold off if systolic BP less than 140 mmHg What changed:  medication strength additional instructions   CertaVite/Antioxidants Tabs Take 1 tablet by mouth daily.   citalopram  40 MG tablet Commonly known as: CELEXA  Take 1 tablet (40 mg total) by mouth daily.   famotidine  20 MG tablet Commonly known as: Pepcid  Take 1 tablet (20 mg total) by mouth 2 (two) times daily.   ipratropium-albuterol  0.5-2.5 (3) MG/3ML Soln Commonly known as: DUONEB Take 3 mLs by nebulization every 6 (six) hours as needed.   LORazepam  0.5 MG tablet Commonly known as: ATIVAN  Take 1 tablet (0.5 mg total) by mouth daily as needed. for anxiety   ramipril  10 MG capsule Commonly known as: ALTACE  Take 10 mg by mouth daily.   rosuvastatin  5 MG tablet Commonly known as: CRESTOR  Take 1 tablet (5 mg total) by mouth daily.   traZODone  50 MG tablet Commonly known as: DESYREL  Take 1 tablet (50 mg total) by  mouth at bedtime as needed for sleep.   Trelegy Ellipta  100-62.5-25 MCG/ACT Aepb Generic drug: Fluticasone -Umeclidin-Vilant Inhale 1 puff into the lungs daily at 12 noon.       Allergies[1] Discharge Instructions     Call MD for:  extreme fatigue   Complete by: As directed    Call MD for:  persistant dizziness or light-headedness   Complete by: As directed    Call MD for:  persistant nausea and vomiting   Complete by: As directed    Call MD for:  severe uncontrolled pain   Complete by: As directed    Discharge instructions   Complete by: As directed    F/u with PCP in 1 wk Repeat BMP in 1 wk Lung cancer screening as an out pt   Increase activity slowly   Complete by: As directed        The results of significant diagnostics from this hospitalization (including imaging, microbiology, ancillary and laboratory) are listed below for reference.    Significant Diagnostic Studies: CT ABDOMEN PELVIS W  CONTRAST Result Date: 10/06/2024 CLINICAL DATA:  Acute nausea, vomiting and diarrhea. EXAM: CT ABDOMEN AND PELVIS WITH CONTRAST TECHNIQUE: Multidetector CT imaging of the abdomen and pelvis was performed using the standard protocol following bolus administration of intravenous contrast. RADIATION DOSE REDUCTION: This exam was performed according to the departmental dose-optimization program which includes automated exposure control, adjustment of the mA and/or kV according to patient size and/or use of iterative reconstruction technique. CONTRAST:  80mL OMNIPAQUE  IOHEXOL  300 MG/ML  SOLN COMPARISON:  July 24, 2024. FINDINGS: Lower chest: No acute abnormality. Hepatobiliary: No focal liver abnormality is seen. No gallstones, gallbladder wall thickening, or biliary dilatation. Pancreas: Unremarkable. No pancreatic ductal dilatation or surrounding inflammatory changes. Spleen: Normal in size without focal abnormality. Adrenals/Urinary Tract: Adrenal glands are unremarkable. Kidneys are normal, without renal calculi, focal lesion, or hydronephrosis. Bladder is unremarkable. Stomach/Bowel: Stomach is within normal limits. Appendix appears normal. No evidence of bowel wall thickening, distention, or inflammatory changes. Vascular/Lymphatic: Aortic atherosclerosis. No enlarged abdominal or pelvic lymph nodes. Reproductive: Uterus and bilateral adnexa are unremarkable. Other: No abdominal wall hernia or abnormality. No abdominopelvic ascites. Musculoskeletal: Possible avascular necrosis seen in right femoral head. IMPRESSION: 1. 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 2. No acute abnormality seen in the abdomen or pelvis. 3. Aortic atherosclerosis. Aortic Atherosclerosis (ICD10-I70.0). Electronically Signed   By: Lynwood Landy Raddle M.D.   On: 10/06/2024 18:11    Microbiology: Recent Results (from the past 240 hours)  Resp panel by RT-PCR (RSV, Flu A&B, Covid) Anterior Nasal Swab     Status: None    Collection Time: 10/06/24  5:19 PM   Specimen: Anterior Nasal Swab  Result Value Ref Range Status   SARS Coronavirus 2 by RT PCR NEGATIVE NEGATIVE Final    Comment: (NOTE) SARS-CoV-2 target nucleic acids are NOT DETECTED.  The SARS-CoV-2 RNA is generally detectable in upper respiratory specimens during the acute phase of infection. The lowest concentration of SARS-CoV-2 viral copies this assay can detect is 138 copies/mL. A negative result does not preclude SARS-Cov-2 infection and should not be used as the sole basis for treatment or other patient management decisions. A negative result may occur with  improper specimen collection/handling, submission of specimen other than nasopharyngeal swab, presence of viral mutation(s) within the areas targeted by this assay, and inadequate number of viral copies(<138 copies/mL). A negative result must be combined with clinical observations, patient history, and epidemiological information.  The expected result is Negative.  Fact Sheet for Patients:  bloggercourse.com  Fact Sheet for Healthcare Providers:  seriousbroker.it  This test is no t yet approved or cleared by the [place]  FDA and  has been authorized for detection and/or diagnosis of SARS-CoV-2 by FDA under an Emergency Use Authorization (EUA). This EUA will remain  in effect (meaning this test can be used) for the duration of the COVID-19 declaration under Section 564(b)(1) of the Act, 21 U.S.C.section 360bbb-3(b)(1), unless the authorization is terminated  or revoked sooner.       Influenza A by PCR NEGATIVE NEGATIVE Final   Influenza B by PCR NEGATIVE NEGATIVE Final    Comment: (NOTE) The Xpert Xpress SARS-CoV-2/FLU/RSV plus assay is intended as an aid in the diagnosis of influenza from Nasopharyngeal swab specimens and should not be used as a sole basis for treatment. Nasal washings and aspirates are unacceptable for  Xpert Xpress SARS-CoV-2/FLU/RSV testing.  Fact Sheet for Patients: bloggercourse.com  Fact Sheet for Healthcare Providers: seriousbroker.it  This test is not yet approved or cleared by the [place]  FDA and has been authorized for detection and/or diagnosis of SARS-CoV-2 by FDA under an Emergency Use Authorization (EUA). This EUA will remain in effect (meaning this test can be used) for the duration of the COVID-19 declaration under Section 564(b)(1) of the Act, 21 U.S.C. section 360bbb-3(b)(1), unless the authorization is terminated or revoked.     Resp Syncytial Virus by PCR NEGATIVE NEGATIVE Final    Comment: (NOTE) Fact Sheet for Patients: bloggercourse.com  Fact Sheet for Healthcare Providers: seriousbroker.it  This test is not yet approved or cleared by the [place]  FDA and has been authorized for detection and/or diagnosis of SARS-CoV-2 by FDA under an Emergency Use Authorization (EUA). This EUA will remain in effect (meaning this test can be used) for the duration of the COVID-19 declaration under Section 564(b)(1) of the Act, 21 U.S.C. section 360bbb-3(b)(1), unless the authorization is terminated or revoked.  Performed at [facility], [address] [place], [place] [address]      Labs: CBC: Recent Labs  Lab 10/06/24 1525 10/07/24 0056  WBC 6.7 7.7  HGB 12.4 10.9*  HCT 34.8* 31.4*  MCV 89.0 90.5  PLT 558* 490*   Basic Metabolic Panel: Recent Labs  Lab 10/06/24 1525 10/07/24 0055 10/07/24 0056  NA 123*  --  131*  K 4.9  --  4.3  CL 88*  --  95*  CO2 22  --  24  GLUCOSE 138*  --  107*  BUN 6*  --  6*  CREATININE 0.54  --  0.50  CALCIUM  9.4  --  8.3*  MG  --  1.9  --   PHOS  --  4.1  --    Liver Function Tests: Recent Labs  Lab 10/06/24 1525  AST 27  ALT 19  ALKPHOS 131*  BILITOT 0.6  PROT 6.5  ALBUMIN 4.2   Recent  Labs  Lab 10/06/24 1525  LIPASE 20   No results for input(s): AMMONIA in the last 168 hours. Cardiac Enzymes: No results for input(s): CKTOTAL, CKMB, CKMBINDEX, TROPONINI in the last 168 hours. BNP (last 3 results) Recent Labs    04/03/24 1200 04/11/24 1054 07/30/24 1330  BNP 231.0* 82.6 94.9   CBG: Recent Labs  Lab 10/07/24 0818  GLUCAP 107*    Time spent: 35 minutes  Signed:  Elvan Sor  [facility] 10/07/2024 12:49 PM      [  1]  Allergies Allergen Reactions   Zithromax  [Azithromycin ] Nausea And Vomiting   Quetiapine  Other (See Comments)    nightmares   Buspar  [Buspirone ] Other (See Comments)    Made her feel crazy

## 2024-10-07 NOTE — Plan of Care (Signed)
  Problem: Education: Goal: Knowledge of General Education information will improve Description: Including pain rating scale, medication(s)/side effects and non-pharmacologic comfort measures Outcome: Progressing   Problem: Health Behavior/Discharge Planning: Goal: Ability to manage health-related needs will improve Outcome: Progressing   Problem: Clinical Measurements: Goal: Ability to maintain clinical measurements within normal limits will improve Outcome: Progressing Goal: Diagnostic test results will improve Outcome: Progressing   Problem: Activity: Goal: Risk for activity intolerance will decrease Outcome: Progressing   

## 2024-10-07 NOTE — Discharge Instructions (Signed)

## 2024-10-07 NOTE — TOC CM/SW Note (Signed)
 Transition of Care [facility]) - Inpatient Brief Assessment   Patient Details  Name: Christine Sparks MRN: [id number] Date of Birth: 1961/09/21  Transition of [facility]) CM/SW Contact:    Daved JONETTA Hamilton, RN Phone Number: 10/07/2024, 8:40 AM   Clinical Narrative:   Transition of Care [facility]) Screening Note   Patient Details  Name: Christine Sparks Date of Birth: 10/01/1961   Transition of Care [facility]) CM/SW Contact:    Daved JONETTA Hamilton, RN Phone Number: 10/07/2024, 8:40 AM  Social resources added to AVS, readmit screening complete.   Transition of Care Department [facility]) has reviewed patient and no TOC needs have been identified at this time. If new patient transition needs arise, please place a TOC consult.    Transition of Care Asessment: Insurance and Status: Insurance coverage has been reviewed Patient has primary care physician: Yes   Prior level of function:: Independent Prior/Current Home Services: No current home services Social Drivers of Health Review: SDOH reviewed interventions complete Readmission risk has been reviewed: Yes Transition of care needs: no transition of care needs at this time

## 2024-10-09 ENCOUNTER — Emergency Department: Admission: EM | Admit: 2024-10-09 | Discharge: 2024-10-09 | Disposition: A | Discharge status: Home / Self Care

## 2024-10-09 ENCOUNTER — Other Ambulatory Visit: Payer: Self-pay

## 2024-10-09 DIAGNOSIS — J449 Chronic obstructive pulmonary disease, unspecified: Secondary | ICD-10-CM | POA: Diagnosis not present

## 2024-10-09 DIAGNOSIS — R112 Nausea with vomiting, unspecified: Secondary | ICD-10-CM | POA: Diagnosis present

## 2024-10-09 DIAGNOSIS — I10 Essential (primary) hypertension: Secondary | ICD-10-CM | POA: Diagnosis not present

## 2024-10-09 DIAGNOSIS — E871 Hypo-osmolality and hyponatremia: Secondary | ICD-10-CM

## 2024-10-09 DIAGNOSIS — I2510 Atherosclerotic heart disease of native coronary artery without angina pectoris: Secondary | ICD-10-CM | POA: Insufficient documentation

## 2024-10-09 LAB — URINALYSIS, ROUTINE W REFLEX MICROSCOPIC
Bilirubin Urine: NEGATIVE
Glucose, UA: NEGATIVE mg/dL
Hgb urine dipstick: NEGATIVE
Ketones, ur: NEGATIVE mg/dL
Leukocytes,Ua: NEGATIVE
Nitrite: NEGATIVE
Protein, ur: NEGATIVE mg/dL
Specific Gravity, Urine: 1.013 (ref 1.005–1.030)
pH: 6 (ref 5.0–8.0)

## 2024-10-09 LAB — CBC WITH DIFFERENTIAL/PLATELET
Abs Immature Granulocytes: 0.14 K/uL — ABNORMAL HIGH (ref 0.00–0.07)
Basophils Absolute: 0 K/uL (ref 0.0–0.1)
Basophils Relative: 0 %
Eosinophils Absolute: 0.1 K/uL (ref 0.0–0.5)
Eosinophils Relative: 0 %
HCT: 35.5 % — ABNORMAL LOW (ref 36.0–46.0)
Hemoglobin: 12.6 g/dL (ref 12.0–15.0)
Immature Granulocytes: 1 %
Lymphocytes Relative: 1 %
Lymphs Abs: 0.1 K/uL — ABNORMAL LOW (ref 0.7–4.0)
MCH: 32.6 pg (ref 26.0–34.0)
MCHC: 35.5 g/dL (ref 30.0–36.0)
MCV: 92 fL (ref 80.0–100.0)
Monocytes Absolute: 0.4 K/uL (ref 0.1–1.0)
Monocytes Relative: 3 %
Neutro Abs: 15.3 K/uL — ABNORMAL HIGH (ref 1.7–7.7)
Neutrophils Relative %: 95 %
Platelets: 517 K/uL — ABNORMAL HIGH (ref 150–400)
RBC: 3.86 MIL/uL — ABNORMAL LOW (ref 3.87–5.11)
RDW: 13 % (ref 11.5–15.5)
WBC: 16.1 K/uL — ABNORMAL HIGH (ref 4.0–10.5)
nRBC: 0 % (ref 0.0–0.2)

## 2024-10-09 LAB — COMPREHENSIVE METABOLIC PANEL WITH GFR
ALT: 17 U/L (ref 0–44)
AST: 33 U/L (ref 15–41)
Albumin: 4 g/dL (ref 3.5–5.0)
Alkaline Phosphatase: 111 U/L (ref 38–126)
Anion gap: 14 (ref 5–15)
BUN: 9 mg/dL (ref 8–23)
CO2: 20 mmol/L — ABNORMAL LOW (ref 22–32)
Calcium: 8.9 mg/dL (ref 8.9–10.3)
Chloride: 93 mmol/L — ABNORMAL LOW (ref 98–111)
Creatinine, Ser: 0.56 mg/dL (ref 0.44–1.00)
GFR, Estimated: >60 mL/min
Glucose, Bld: 134 mg/dL — ABNORMAL HIGH (ref 70–99)
Potassium: 4.7 mmol/L (ref 3.5–5.1)
Sodium: 127 mmol/L — ABNORMAL LOW (ref 135–145)
Total Bilirubin: 0.6 mg/dL (ref 0.0–1.2)
Total Protein: 6.3 g/dL — ABNORMAL LOW (ref 6.5–8.1)

## 2024-10-09 LAB — LIPASE, BLOOD: Lipase: 25 U/L (ref 11–51)

## 2024-10-09 MED ORDER — SUCRALFATE 1 G PO TABS
1.0000 g | ORAL_TABLET | Freq: Once | ORAL | Status: AC
  Administered 2024-10-09: 1 g via ORAL
  Filled 2024-10-09: qty 1

## 2024-10-09 MED ORDER — PROMETHAZINE HCL 25 MG RE SUPP: 25.0000 mg | Freq: Four times a day (QID) | RECTAL | 0 refills | Status: DC | PRN

## 2024-10-09 MED ORDER — ONDANSETRON HCL 4 MG/2ML IJ SOLN
4.0000 mg | Freq: Once | INTRAMUSCULAR | Status: AC
  Administered 2024-10-09: 4 mg via INTRAVENOUS
  Filled 2024-10-09: qty 2

## 2024-10-09 MED ORDER — ONDANSETRON 4 MG PO TBDP: 4.0000 mg | ORAL_TABLET | Freq: Three times a day (TID) | ORAL | 0 refills | Status: DC | PRN

## 2024-10-09 MED ORDER — DIPHENHYDRAMINE HCL 50 MG/ML IJ SOLN
12.5000 mg | Freq: Once | INTRAMUSCULAR | Status: AC
  Administered 2024-10-09: 12.5 mg via INTRAVENOUS
  Filled 2024-10-09: qty 1

## 2024-10-09 MED ORDER — LORAZEPAM 0.5 MG PO TABS
0.5000 mg | ORAL_TABLET | Freq: Once | ORAL | Status: AC
  Administered 2024-10-09: 0.5 mg via ORAL
  Filled 2024-10-09: qty 1

## 2024-10-09 MED ORDER — METOCLOPRAMIDE HCL 5 MG/ML IJ SOLN
5.0000 mg | Freq: Once | INTRAMUSCULAR | Status: AC
  Administered 2024-10-09: 5 mg via INTRAVENOUS
  Filled 2024-10-09: qty 2

## 2024-10-09 MED ORDER — LACTATED RINGERS IV BOLUS
1000.0000 mL | Freq: Once | INTRAVENOUS | Status: AC
  Administered 2024-10-09: 1000 mL via INTRAVENOUS

## 2024-10-09 MED ORDER — AMLODIPINE BESYLATE 5 MG PO TABS
10.0000 mg | ORAL_TABLET | Freq: Once | ORAL | Status: AC
  Administered 2024-10-09: 10 mg via ORAL
  Filled 2024-10-09: qty 2

## 2024-10-09 MED ORDER — FAMOTIDINE IN NACL 20-0.9 MG/50ML-% IV SOLN
20.0000 mg | Freq: Once | INTRAVENOUS | Status: AC
  Administered 2024-10-09: 20 mg via INTRAVENOUS
  Filled 2024-10-09: qty 50

## 2024-10-09 NOTE — ED Triage Notes (Signed)
 Patient was discharged 10/07/24 with Norovirus, symptoms of N/V persist denies pain   hx copd

## 2024-10-09 NOTE — Discharge Instructions (Addendum)
 You was seen in the emergency department for continued nausea and vomiting.  Workup today demonstrated a slightly low sodium level which was repleted.  Please try to abstain from just drinking free water.  Instead stick to electrolyte solutions or bland diet such as chicken and rice or chicken noodle soup.  You need to have your sodium rechecked in 1 week's time.  Use your Zofran  as first-line for nausea and if your nausea continues after 20 minutes then use a Phenergan  suppository (keep in mind that these need to be refrigerated.)  Return with any acutely worsening symptoms. --  RETURN PRECAUTIONS & AFTERCARE: (ENGLISH) RETURN PRECAUTIONS: Return immediately to the emergency department or see/call your doctor if you feel worse, weak or have changes in speech or vision, are short of breath, have fever, vomiting, pain, bleeding or dark stool, trouble urinating or any new issues. Return here or see/call your doctor if not improving as expected for your suspected condition. FOLLOW-UP CARE: Call your doctor and/or any doctors we referred you to for more advice and to make an appointment. Do this today, tomorrow or after the weekend. Some doctors only take PPO insurance so if you have HMO insurance you may want to contact your HMO or your regular doctor for referral to a specialist within your plan. Either way tell the doctor's office that it was a referral from the emergency department so you get the soonest possible appointment.  YOUR TEST RESULTS: Take result reports of any blood or urine tests, imaging tests and EKG's to your doctor and any referral doctor. Have any abnormal tests repeated. Your doctor or a referral doctor can let you know when this should be done. Also make sure your doctor contacts this hospital to get any test results that are not currently available such as cultures or special tests for infection and final imaging reports, which are often not available at the time you leave the ER but which  may list additional important findings that are not documented on the preliminary report. BLOOD PRESSURE: If your blood pressure was greater than 120/80 have your blood pressure rechecked within 1 to 2 weeks. MEDICATION SIDE EFFECTS: Do not drive, walk, bike, take the bus, etc. if you have received or are being prescribed any sedating medications such as those for pain or anxiety or certain antihistamines like Benadryl . If you have been give one of these here get a taxi home or have a friend drive you home. Ask your pharmacist to counsel you on potential side effects of any new medication

## 2024-10-09 NOTE — ED Provider Notes (Signed)
 Clinical Course as of 10/09/24 1453  Wed Oct 09, 2024  0711 Received signout: just admitted for gastroenteritis. Intractable n/v, benign abdominal exam, recent CT scan.  [HD]  [id number] Patient reexamined abdomen completely benign.  She reports still continued nausea but no emesis in the room.  EKG as follows: EKG and rhythm strip are interpreted by myself:   EKG: [tachycardic sinus rhythm] at heart rate of 99, normal QRS duration, QTc 460, nonspecific ST segments and T waves no ectopy EKG not consistent with Acute STEMI Rhythm strip: tachycardic sinus in lead II  Patient did not take morning meds and will administer this as well  [HD]  0817 Urinalysis, Routine w reflex microscopic -Urine, Clean Catch(!) Not c/w in UTI [HD]  0828 Patient did have a low sodium but did receive isotonic fluids via the other physician.  I do think this is likely secondary to poor p.o. combined with patient's ramipril  which has been recently discontinued.  I did consider readmission today however patient prefers to return home which I think is reasonable.  She was advised regarding the need for repeat sodium check in 1 week.  She has a primary care physician and feels as if she can get good follow-up  Patient has tolerated p.o. feels improved feels comfortable returning home.  She states that her ramipril  has already been discontinued at last hospital admission.  She was stick to bland foods and have her sodium rechecked in 1 week by her primary care physician.  I will send her with prescription for Zofran  and Phenergan  suppositories.  All questions answered and patient voiced understanding and requested discharge [HD]  [phone] Of note, patients' ramipralil was recently discontinued by last hospitalist at discharge.  However he has not done a discharge summary yet [HD]    Clinical Course User Index [HD] Nicholaus Rolland BRAVO, MD   At time of discharge there is no evidence of acute life, limb, vision, or fertility threat. Patient  has stable vital signs, pain is well controlled, patient is ambulatory and p.o. tolerant.  Discharge instructions were completed using the EPIC system. I would refer you to those at this time. All warnings prescriptions follow-up etc. were discussed in detail with the patient. Patient indicates understanding and is agreeable with this plan. All questions answered.  Patient is made aware that they may return to the emergency department for any worsening or new condition or for any other emergency.    Nicholaus Rolland BRAVO, MD 10/09/24 [phone]

## 2024-10-09 NOTE — ED Provider Notes (Signed)
 "  [facility] Provider Note    Event Date/Time   First MD Initiated Contact with Patient 10/09/24 [phone]     (approximate)   History   Chief Complaint Nausea (Patient was discharged 10/07/24 with Norovirus, symptoms of N/V persist denies pain hx copd)   HPI  Christine Sparks is a 64 y.o. female with past medical history of hypertension, hyperlipidemia, CAD, and COPD who presents to the ED complaining of nausea and vomiting.  Patient reports that she was recently admitted to the hospital for nausea, vomiting, and diarrhea but had felt better when she was discharged 2 days ago.  States that the vomiting came back overnight and she has had numerous episodes of emesis since 3 AM.  She has been unable to keep anything down during this time, also reports watery diarrhea.  She has not noticed any blood in her emesis or stool and denies any abdominal pain or fevers.  She has not had any cough, chest pain, or shortness of breath.     Physical Exam   Triage Vital Signs: ED Triage Vitals  Encounter Vitals Group     BP      Girls Systolic BP Percentile      Girls Diastolic BP Percentile      Boys Systolic BP Percentile      Boys Diastolic BP Percentile      Pulse      Resp      Temp      Temp src      SpO2      Weight      Height      Head Circumference      Peak Flow      Pain Score      Pain Loc      Pain Education      Exclude from Growth Chart     Most recent vital signs: Vitals:   10/09/24 0607 10/09/24 0609  BP:  139/82  Pulse:  (!) 110  Resp:  18  Temp:  97.8 F (36.6 C)  SpO2: 96% 95%    Constitutional: Alert and oriented. Eyes: Conjunctivae are normal. Head: Atraumatic. Nose: No congestion/rhinnorhea. Mouth/Throat: Mucous membranes are moist.  Cardiovascular: Normal rate, regular rhythm. Grossly normal heart sounds.  2+ radial pulses bilaterally. Respiratory: Normal respiratory effort.  No retractions. Lungs CTAB. Gastrointestinal: Soft and  nontender. No distention. Musculoskeletal: No lower extremity tenderness nor edema.  Neurologic:  Normal speech and language. No gross focal neurologic deficits are appreciated.    ED Results / Procedures / Treatments   Labs (all labs ordered are listed, but only abnormal results are displayed) Labs Reviewed  CBC WITH DIFFERENTIAL/PLATELET  COMPREHENSIVE METABOLIC PANEL WITH GFR  LIPASE, BLOOD  URINALYSIS, ROUTINE W REFLEX MICROSCOPIC    PROCEDURES:  Critical Care performed: No  Procedures   MEDICATIONS ORDERED IN ED: Medications  ondansetron  (ZOFRAN ) injection 4 mg (4 mg Intravenous Given 10/09/24 0618)  lactated ringers  bolus 1,000 mL (1,000 mLs Intravenous New Bag/Given 10/09/24 0619)     IMPRESSION / MDM / ASSESSMENT AND PLAN / ED COURSE  I reviewed the triage vital signs and the nursing notes.                              64 y.o. female with past medical history of hypertension, hyperlipidemia, CAD, and COPD who presents to the ED complaining of nausea and vomiting since 3:00  this morning with inability keep down either liquids or solids.  Patient's presentation is most consistent with acute presentation with potential threat to life or bodily function.  Differential diagnosis includes, but is not limited to, gastroenteritis, dehydration, electrolyte abnormality, AKI, UTI.  Patient well-appearing and in no acute distress, vital signs remarkable for tachycardia but otherwise reassuring.  Patient has a benign abdominal exam and symptoms seem consistent with viral illness.  She did have CT imaging 3 days ago that was unremarkable and I do not feel repeat indicated at this time.  Lab results and urinalysis are pending, will treat symptomatically with IV Zofran  and hydrate with IV fluids.  Patient turned over to oncoming provider pending lab results and reassessment.      FINAL CLINICAL IMPRESSION(S) / ED DIAGNOSES   Final diagnoses:  Nausea and vomiting, unspecified  vomiting type     Rx / DC Orders   ED Discharge Orders     None        Note:  This document was prepared using Dragon voice recognition software and may include unintentional dictation errors.   Willo Dunnings, MD 10/09/24 [phone]  "

## 2024-10-16 ENCOUNTER — Telehealth: Payer: Self-pay | Admitting: Pediatrics

## 2024-10-16 DIAGNOSIS — E871 Hypo-osmolality and hyponatremia: Secondary | ICD-10-CM

## 2024-10-16 NOTE — Telephone Encounter (Signed)
 Copied from CRM [id number]. Topic: Clinical - Lab/Test Results >> Oct 16, 2024  3:45 PM Myrick T wrote: Reason for CRM: patient called to request orders to check her sodium levels before her appt on 1/22. Please f/u with patient once the orders have been placed

## 2024-10-17 NOTE — Telephone Encounter (Signed)
 Can this be ordered for the patient? Reviewed last labs in the chart from Dr. Herold and she stated that sodium level was still low but improving some.

## 2024-10-17 NOTE — Telephone Encounter (Signed)
 Called and LVM asking for patient to please return my call and schedule lab visit.   OK for E2C2 to speak with patient and schedule lab visit.

## 2024-10-17 NOTE — Telephone Encounter (Signed)
 This is fine. Order in.

## 2024-10-24 ENCOUNTER — Other Ambulatory Visit: Payer: Self-pay | Admitting: Family Medicine

## 2024-10-24 ENCOUNTER — Encounter: Payer: Self-pay | Admitting: Family Medicine

## 2024-10-24 ENCOUNTER — Ambulatory Visit (INDEPENDENT_AMBULATORY_CARE_PROVIDER_SITE_OTHER): Admitting: Family Medicine

## 2024-10-24 VITALS — BP 113/68 | HR 91 | Resp 18 | Ht 64.02 in | Wt 120.8 lb

## 2024-10-24 DIAGNOSIS — K219 Gastro-esophageal reflux disease without esophagitis: Secondary | ICD-10-CM | POA: Diagnosis not present

## 2024-10-24 DIAGNOSIS — J441 Chronic obstructive pulmonary disease with (acute) exacerbation: Secondary | ICD-10-CM

## 2024-10-24 DIAGNOSIS — E871 Hypo-osmolality and hyponatremia: Secondary | ICD-10-CM | POA: Diagnosis not present

## 2024-10-24 DIAGNOSIS — Z23 Encounter for immunization: Secondary | ICD-10-CM

## 2024-10-24 DIAGNOSIS — E782 Mixed hyperlipidemia: Secondary | ICD-10-CM

## 2024-10-24 DIAGNOSIS — J449 Chronic obstructive pulmonary disease, unspecified: Secondary | ICD-10-CM | POA: Diagnosis not present

## 2024-10-24 DIAGNOSIS — I10 Essential (primary) hypertension: Secondary | ICD-10-CM | POA: Diagnosis not present

## 2024-10-24 DIAGNOSIS — F419 Anxiety disorder, unspecified: Secondary | ICD-10-CM

## 2024-10-24 DIAGNOSIS — R600 Localized edema: Secondary | ICD-10-CM | POA: Diagnosis not present

## 2024-10-24 DIAGNOSIS — E538 Deficiency of other specified B group vitamins: Secondary | ICD-10-CM | POA: Diagnosis not present

## 2024-10-24 DIAGNOSIS — F410 Panic disorder [episodic paroxysmal anxiety] without agoraphobia: Secondary | ICD-10-CM | POA: Diagnosis not present

## 2024-10-24 DIAGNOSIS — E559 Vitamin D deficiency, unspecified: Secondary | ICD-10-CM | POA: Diagnosis not present

## 2024-10-24 MED ORDER — LORAZEPAM 0.5 MG PO TABS: 0.5000 mg | ORAL_TABLET | Freq: Every day | ORAL | 0 refills | Status: AC | PRN

## 2024-10-24 MED ORDER — FAMOTIDINE 20 MG PO TABS: 20.0000 mg | ORAL_TABLET | Freq: Two times a day (BID) | ORAL | 1 refills | Status: AC

## 2024-10-24 MED ORDER — TRAZODONE HCL 50 MG PO TABS: 50.0000 mg | ORAL_TABLET | Freq: Every evening | ORAL | 0 refills | Status: AC | PRN

## 2024-10-24 MED ORDER — ALBUTEROL SULFATE HFA 108 (90 BASE) MCG/ACT IN AERS: 2.0000 | INHALATION_SPRAY | Freq: Four times a day (QID) | RESPIRATORY_TRACT | 6 refills | Status: AC | PRN

## 2024-10-24 MED ORDER — ROSUVASTATIN CALCIUM 5 MG PO TABS: 5.0000 mg | ORAL_TABLET | Freq: Every day | ORAL | 0 refills | Status: AC

## 2024-10-24 MED ORDER — TRELEGY ELLIPTA 100-62.5-25 MCG/ACT IN AEPB: 1.0000 | INHALATION_SPRAY | Freq: Every day | RESPIRATORY_TRACT | 1 refills | Status: AC

## 2024-10-24 MED ORDER — RAMIPRIL 10 MG PO CAPS: 10.0000 mg | ORAL_CAPSULE | Freq: Every day | ORAL | 0 refills | Status: AC

## 2024-10-24 NOTE — Progress Notes (Signed)
 "  BP 113/68 (BP Location: Left Arm, Patient Position: Sitting, Cuff Size: Normal)   Pulse 91   Resp 18   Ht 5' 4.02 (1.626 m)   Wt 120 lb 12.8 oz (54.8 kg)   SpO2 97%   BMI 20.72 kg/m    Subjective:    Patient ID: Christine Sparks, female    DOB: Aug 17, 1961, 64 y.o.   MRN: [id number]  HPI: Christine Sparks is a 64 y.o. female who presents today to establish care after her PCP left the practice.   Chief Complaint  Patient presents with   Hypertension   Hyperlipidemia   Anxiety    Anxiety is pretty bad   COPD   COPD COPD status: uncontrolled Satisfied with current treatment?: no Oxygen use: no Dyspnea frequency: daily Cough frequency: occasionally Rescue inhaler frequency:  several times a day Limitation of activity: yes Productive cough: yes Pneumovax: Up to Date Influenza: Up to Date  ANXIETY/STRESS Duration: chronic Status:stable Anxious mood: yes  Excessive worrying: yes Irritability: yes  Sweating: yes Nausea: yes Palpitations:yes Hyperventilation: no Panic attacks: yes Agoraphobia: no  Obscessions/compulsions: no Depressed mood: yes    10/24/2024   10:08 AM 06/13/2024    2:01 PM 05/13/2024   10:55 AM 03/13/2024    1:36 PM 01/19/2024    8:56 AM  Depression screen PHQ 2/9  Decreased Interest 2 3 1 1  0  Down, Depressed, Hopeless 2 1 1 1  0  PHQ - 2 Score 4 4 2 2  0  Altered sleeping 3 0 1 2   Tired, decreased energy 1 1 1 2    Change in appetite 1 1 1 2    Feeling bad or failure about yourself  0 0 0 2   Trouble concentrating 0 0 1 0   Moving slowly or fidgety/restless 1 0 0 0   Suicidal thoughts 0 0 0 0   PHQ-9 Score 10 6  6  10     Difficult doing work/chores Not difficult at all Not difficult at all        Data saved with a previous flowsheet row definition      10/24/2024   10:09 AM 06/13/2024    2:04 PM 05/13/2024   10:56 AM 03/13/2024    1:36 PM  GAD 7 : Generalized Anxiety Score  Nervous, Anxious, on Edge 3 0  2  1   Control/stop worrying 3 0  2   1   Worry too much - different things 3 0  2  1   Trouble relaxing 0 0  2  1   Restless 0 0  0  0   Easily annoyed or irritable 1 0  1  1   Afraid - awful might happen 1 0  1  1   Total GAD 7 Score 11 0 10 6  Anxiety Difficulty Extremely difficult Not difficult at all       Data saved with a previous flowsheet row definition   Anhedonia: no Weight changes: no Insomnia: no   Hypersomnia: no Fatigue/loss of energy: yes Feelings of worthlessness: no Feelings of guilt: no Impaired concentration/indecisiveness: no Suicidal ideations: no  Crying spells: no Recent Stressors/Life Changes: no   Relationship problems: no   Family stress: no     Financial stress: no    Job stress: no    Recent death/loss: no  HYPERTENSION / HYPERLIPIDEMIA Satisfied with current treatment? yes Duration of hypertension: chronic BP monitoring frequency: not checking BP medication side effects: no  Past BP meds: amlodipine , ramipiril Duration of hyperlipidemia: chronic Cholesterol medication side effects: no Cholesterol supplements: none Past cholesterol medications: crestor  Medication compliance: good compliance Aspirin : no Recent stressors: no Recurrent headaches: no Visual changes: no Palpitations: no Dyspnea: no Chest pain: no Lower extremity edema: yes- for about 4 days Dizzy/lightheaded: no  Active Ambulatory Problems    Diagnosis Date Noted   COPD (chronic obstructive pulmonary disease) (HCC) 11/07/2011   Anxiety    Tobacco abuse    Essential hypertension, benign    Menopausal syndrome    History of cervical cancer 02/24/2013   B12 deficiency 02/24/2013   Vitamin D  deficiency 12/20/2013   Vaginal atrophy 07/07/2016   Uterine leiomyoma 07/07/2016   History of endometriosis 07/07/2016   Hyponatremia 01/10/2023   Diverticulosis 01/10/2023   Mixed hyperlipidemia 06/01/2023   Depression, major, single episode, moderate (HCC) 11/17/2023   Panic attacks 11/26/2023   Hypomagnesemia  03/28/2024   Alcohol use disorder 04/10/2024   GERD without esophagitis 10/06/2024   Resolved Ambulatory Problems    Diagnosis Date Noted   Sinusitis, bacterial 11/07/2011   Bronchitis, chronic obstructive (HCC)    Diabetes mellitus type 2, uncomplicated (HCC) 10/03/2010   Joint pain 08/11/2012   Encounter for preventive health examination 11/26/2013   External hemorrhoids 07/07/2016   History of cone biopsy of cervix 07/07/2016   Well woman exam with routine gynecological exam 07/07/2016   Allergic rhinitis due to pollen 01/10/2023   Cigarette nicotine  dependence without complication 06/01/2023   COPD exacerbation (HCC) 06/20/2023   Acute respiratory failure with hypoxia (HCC) 09/19/2023   NSTEMI (non-ST elevated myocardial infarction) (HCC) 09/19/2023   Alcohol withdrawal syndrome, with delirium (HCC) 03/26/2024   Nausea vomiting and diarrhea 03/28/2024   COPD with acute exacerbation (HCC) 03/28/2024   Essential hypertension 10/06/2024   Anxiety and depression 10/06/2024   Dyslipidemia 10/06/2024   Chronic obstructive pulmonary disease (COPD) (HCC) 10/06/2024   Past Medical History:  Diagnosis Date   Anemia    Arthritis    Depression    Diabetes mellitus 10/2010   Dyspnea    H/O bronchitis    Heart murmur    High cholesterol    Hypertension    Past Surgical History:  Procedure Laterality Date   BREAST BIOPSY Left 07/26/2016   complex sclerosing lesion. Coil shape marker   BREAST LUMPECTOMY Left 08/17/2016   complex sclerosing lesion with focal ADH. Clear margins. The coil shaped biopsy marker remains    BREAST LUMPECTOMY WITH NEEDLE LOCALIZATION Left 08/17/2016   Procedure: BREAST LUMPECTOMY WITH NEEDLE LOCALIZATION;  Surgeon: Louanne KANDICE Muse, MD;  Location: [facility] ORS;  Service: General;  Laterality: Left;   CERVICAL CONE BIOPSY     DILATION AND CURETTAGE OF UTERUS     EXCISION / BIOPSY BREAST / NIPPLE / DUCT Left 08/17/2016   complex sclerosing lesion removed    LIPOMA EXCISION Right    benign tumor right leg   LYMPH NODE BIOPSY     TONSILLECTOMY     Outpatient Encounter Medications as of 10/24/2024  Medication Sig Note   citalopram  (CELEXA ) 40 MG tablet Take 1 tablet (40 mg total) by mouth daily.    ipratropium-albuterol  (DUONEB) 0.5-2.5 (3) MG/3ML SOLN Take 3 mLs by nebulization every 6 (six) hours as needed. 03/26/2024: prn   Multiple Vitamin (MULTIVITAMIN WITH MINERALS) TABS tablet Take 1 tablet by mouth daily. 10/06/2024: Pt takes senior Ceretive brand of MVI.    [DISCONTINUED] albuterol  (VENTOLIN  HFA) 108 (90 Base) MCG/ACT inhaler Inhale 2  puffs into the lungs every 6 (six) hours as needed for shortness of breath.    [DISCONTINUED] amLODipine  (NORVASC ) 5 MG tablet Take 2 tablets (10 mg total) by mouth daily. Hold off if systolic BP less than 140 mmHg    [DISCONTINUED] famotidine  (PEPCID ) 20 MG tablet Take 1 tablet (20 mg total) by mouth 2 (two) times daily.    [DISCONTINUED] Fluticasone -Umeclidin-Vilant (TRELEGY ELLIPTA ) 100-62.5-25 MCG/ACT AEPB Inhale 1 puff into the lungs daily at 12 noon.    [DISCONTINUED] LORazepam  (ATIVAN ) 0.5 MG tablet Take 1 tablet (0.5 mg total) by mouth daily as needed. for anxiety    [DISCONTINUED] ondansetron  (ZOFRAN -ODT) 4 MG disintegrating tablet Take 1 tablet (4 mg total) by mouth every 8 (eight) hours as needed for nausea or vomiting.    [DISCONTINUED] promethazine  (PHENERGAN ) 25 MG suppository Place 1 suppository (25 mg total) rectally every 6 (six) hours as needed for nausea or vomiting.    [DISCONTINUED] rosuvastatin  (CRESTOR ) 5 MG tablet Take 1 tablet (5 mg total) by mouth daily.    [DISCONTINUED] traZODone  (DESYREL ) 50 MG tablet Take 1 tablet (50 mg total) by mouth at bedtime as needed for sleep.    albuterol  (VENTOLIN  HFA) 108 (90 Base) MCG/ACT inhaler Inhale 2 puffs into the lungs every 6 (six) hours as needed for shortness of breath.    famotidine  (PEPCID ) 20 MG tablet Take 1 tablet (20 mg total) by mouth 2  (two) times daily.    Fluticasone -Umeclidin-Vilant (TRELEGY ELLIPTA ) 100-62.5-25 MCG/ACT AEPB Inhale 1 puff into the lungs daily at 12 noon.    LORazepam  (ATIVAN ) 0.5 MG tablet Take 1 tablet (0.5 mg total) by mouth daily as needed. for anxiety    ramipril  (ALTACE ) 10 MG capsule Take 1 capsule (10 mg total) by mouth daily.    rosuvastatin  (CRESTOR ) 5 MG tablet Take 1 tablet (5 mg total) by mouth daily.    traZODone  (DESYREL ) 50 MG tablet Take 1 tablet (50 mg total) by mouth at bedtime as needed for sleep.    [DISCONTINUED] ramipril  (ALTACE ) 10 MG capsule Take 10 mg by mouth daily. (Patient not taking: Reported on 10/24/2024)    No facility-administered encounter medications on file as of 10/24/2024.   Allergies[1] Social History   Socioeconomic History   Marital status: Divorced    Spouse name: Not on file   Number of children: Not on file   Years of education: Not on file   Highest education level: 12th grade  Occupational History   Not on file  Tobacco Use   Smoking status: Every Day    Average packs/day: 0.5 packs/day for 45.1 years (22.6 ttl pk-yrs)    Types: Cigarettes    Start date: 10/03/1978    Passive exposure: Past   Smokeless tobacco: Never  Vaping Use   Vaping status: Never Used  Substance and Sexual Activity   Alcohol use: Yes    Alcohol/week: 6.0 standard drinks of alcohol    Types: 6 Glasses of wine per week    Comment: daily   Drug use: No   Sexual activity: Not Currently    Birth control/protection: Post-menopausal  Other Topics Concern   Not on file  Social History Narrative   Not on file   Social Drivers of Health   Tobacco Use: High Risk (10/24/2024)   Patient History    Smoking Tobacco Use: Every Day    Smokeless Tobacco Use: Never    Passive Exposure: Past  Financial Resource Strain: Low Risk (01/19/2024)   Overall Financial  Resource Strain (CARDIA)    Difficulty of Paying Living Expenses: Not hard at all  Food Insecurity: No Food Insecurity  (10/06/2024)   Epic    Worried About Programme Researcher, Broadcasting/film/video in the Last Year: Never true    Ran Out of Food in the Last Year: Never true  Transportation Needs: No Transportation Needs (10/06/2024)   Epic    Lack of Transportation (Medical): No    Lack of Transportation (Non-Medical): No  Recent Concern: Transportation Needs - Unmet Transportation Needs (07/24/2024)   Epic    Lack of Transportation (Medical): Yes    Lack of Transportation (Non-Medical): Yes  Physical Activity: Insufficiently Active (01/19/2024)   Exercise Vital Sign    Days of Exercise per Week: 3 days    Minutes of Exercise per Session: 40 min  Stress: No Stress Concern Present (01/19/2024)   [employer] of Occupational Health - Occupational Stress Questionnaire    Feeling of Stress : Not at all  Social Connections: Socially Isolated (10/06/2024)   Social Connection and Isolation Panel    Frequency of Communication with Friends and Family: Once a week    Frequency of Social Gatherings with Friends and Family: Never    Attends Religious Services: Never    Database Administrator or Organizations: No    Attends Banker Meetings: Never    Marital Status: Divorced  Depression (PHQ2-9): Medium Risk (10/24/2024)   Depression (PHQ2-9)    PHQ-2 Score: 10  Alcohol Screen: Low Risk (01/19/2024)   Alcohol Screen    Last Alcohol Screening Score (AUDIT): 3  Housing: Low Risk (10/06/2024)   Epic    Unable to Pay for Housing in the Last Year: No    Number of Times Moved in the Last Year: 0    Homeless in the Last Year: No  Utilities: Not At Risk (10/06/2024)   Epic    Threatened with loss of utilities: No  Health Literacy: Adequate Health Literacy (11/10/2023)   B1300 Health Literacy    Frequency of need for help with medical instructions: Never   Family History  Problem Relation Age of Onset   Cancer Mother        melanoma   Cancer Father        Lung Cancer   Early death Brother    Heart disease Brother     Breast cancer Paternal Aunt 28   Colon cancer Other    Ovarian cancer Neg Hx    Diabetes Neg Hx      Review of Systems  Constitutional: Negative.   HENT: Negative.    Respiratory:  Positive for cough and wheezing. Negative for apnea, choking, chest tightness, shortness of breath and stridor.   Cardiovascular:  Positive for leg swelling. Negative for chest pain and palpitations.  Musculoskeletal: Negative.   Neurological: Negative.   Psychiatric/Behavioral:  Negative for agitation, behavioral problems, confusion, decreased concentration, dysphoric mood, hallucinations, self-injury, sleep disturbance and suicidal ideas. The patient is nervous/anxious. The patient is not hyperactive.     Per HPI unless specifically indicated above     Objective:    BP 113/68 (BP Location: Left Arm, Patient Position: Sitting, Cuff Size: Normal)   Pulse 91   Resp 18   Ht 5' 4.02 (1.626 m)   Wt 120 lb 12.8 oz (54.8 kg)   SpO2 97%   BMI 20.72 kg/m   Wt Readings from Last 3 Encounters:  10/24/24 120 lb 12.8 oz (54.8 kg)  10/09/24 121 lb (  54.9 kg)  10/06/24 121 lb (54.9 kg)    Physical Exam Vitals and nursing note reviewed.  Constitutional:      General: She is not in acute distress.    Appearance: Normal appearance. She is obese. She is not ill-appearing, toxic-appearing or diaphoretic.  HENT:     Head: Normocephalic and atraumatic.     Right Ear: External ear normal.     Left Ear: External ear normal.     Nose: Nose normal.     Mouth/Throat:     Mouth: Mucous membranes are moist.     Pharynx: Oropharynx is clear.  Eyes:     General: No scleral icterus.       Right eye: No discharge.        Left eye: No discharge.     Extraocular Movements: Extraocular movements intact.     Conjunctiva/sclera: Conjunctivae normal.     Pupils: Pupils are equal, round, and reactive to light.  Cardiovascular:     Rate and Rhythm: Normal rate and regular rhythm.     Pulses: Normal pulses.     Heart  sounds: Normal heart sounds. No murmur heard.    No friction rub. No gallop.  Pulmonary:     Effort: Pulmonary effort is normal. No respiratory distress.     Breath sounds: Normal breath sounds. No stridor. No wheezing, rhonchi or rales.  Chest:     Chest wall: No tenderness.  Musculoskeletal:        General: Normal range of motion.     Cervical back: Normal range of motion and neck supple.     Right lower leg: Edema (1+) present.     Left lower leg: Edema (1+) present.  Skin:    General: Skin is warm and dry.     Capillary Refill: Capillary refill takes less than 2 seconds.     Coloration: Skin is not jaundiced or pale.     Findings: No bruising, erythema, lesion or rash.  Neurological:     General: No focal deficit present.     Mental Status: She is alert and oriented to person, place, and time. Mental status is at baseline.  Psychiatric:        Mood and Affect: Mood normal.        Behavior: Behavior normal.        Thought Content: Thought content normal.        Judgment: Judgment normal.     Results for orders placed or performed in visit on 10/24/24  B Nat Peptide   Collection Time: 10/24/24 10:57 AM  Result Value Ref Range   BNP 78.8 0.0 - 100.0 pg/mL  CBC with Differential/Platelet   Collection Time: 10/24/24 10:57 AM  Result Value Ref Range   WBC 9.3 3.4 - 10.8 x10E3/uL   RBC 3.99 3.77 - 5.28 x10E6/uL   Hemoglobin 12.4 11.1 - 15.9 g/dL   Hematocrit 61.1 65.9 - 46.6 %   MCV 97 79 - 97 fL   MCH 31.1 26.6 - 33.0 pg   MCHC 32.0 31.5 - 35.7 g/dL   RDW 87.8 88.2 - 84.5 %   Platelets 834 (HH) 150 - 450 x10E3/uL   Neutrophils 75 Not Estab. %   Lymphs 13 Not Estab. %   Monocytes 9 Not Estab. %   Eos 2 Not Estab. %   Basos 1 Not Estab. %   Neutrophils Absolute 7.0 1.4 - 7.0 x10E3/uL   Lymphocytes Absolute 1.2 0.7 - 3.1 x10E3/uL  Monocytes Absolute 0.8 0.1 - 0.9 x10E3/uL   EOS (ABSOLUTE) 0.2 0.0 - 0.4 x10E3/uL   Basophils Absolute 0.1 0.0 - 0.2 x10E3/uL   Immature  Granulocytes 0 Not Estab. %   Immature Grans (Abs) 0.0 0.0 - 0.1 x10E3/uL  Comprehensive metabolic panel with GFR   Collection Time: 10/24/24 10:57 AM  Result Value Ref Range   Glucose 91 70 - 99 mg/dL   BUN 12 8 - 27 mg/dL   Creatinine, Ser 9.27 0.57 - 1.00 mg/dL   eGFR 93 >40 fO/fpw/8.26   BUN/Creatinine Ratio 17 12 - 28   Sodium 134 134 - 144 mmol/L   Potassium 4.5 3.5 - 5.2 mmol/L   Chloride 95 (L) 96 - 106 mmol/L   CO2 22 20 - 29 mmol/L   Calcium  9.5 8.7 - 10.3 mg/dL   Total Protein 6.1 6.0 - 8.5 g/dL   Albumin 4.2 3.9 - 4.9 g/dL   Globulin, Total 1.9 1.5 - 4.5 g/dL   Bilirubin Total 0.2 0.0 - 1.2 mg/dL   Alkaline Phosphatase 85 49 - 135 IU/L   AST 22 0 - 40 IU/L   ALT 22 0 - 32 IU/L  Lipid Panel w/o Chol/HDL Ratio   Collection Time: 10/24/24 10:57 AM  Result Value Ref Range   Cholesterol, Total 179 100 - 199 mg/dL   Triglycerides 77 0 - 149 mg/dL   HDL 70 >60 mg/dL   VLDL Cholesterol Cal 14 5 - 40 mg/dL   LDL Chol Calc (NIH) 95 0 - 99 mg/dL  VITAMIN D  25 Hydroxy (Vit-D Deficiency, Fractures)   Collection Time: 10/24/24 10:57 AM  Result Value Ref Range   Vit D, 25-Hydroxy 45.1 30.0 - 100.0 ng/mL  B12   Collection Time: 10/24/24 10:57 AM  Result Value Ref Range   Vitamin B-12 581 232 - 1,245 pg/mL      Assessment & Plan:   Problem List Items Addressed This Visit       Cardiovascular and Mediastinum   Essential hypertension, benign - Primary   + Edema on amlodipine - will change back to ramipril . Follow up 1 month. Call with any concerns.       Relevant Medications   ramipril  (ALTACE ) 10 MG capsule   rosuvastatin  (CRESTOR ) 5 MG tablet   Other Relevant Orders   CBC with Differential/Platelet (Completed)   Comprehensive metabolic panel with GFR (Completed)     Respiratory   COPD (chronic obstructive pulmonary disease) (HCC)   Lungs clear. Continue inhalers. Continue to monitor. Call with any concerns.       Relevant Medications   albuterol  (VENTOLIN  HFA)  108 (90 Base) MCG/ACT inhaler   Fluticasone -Umeclidin-Vilant (TRELEGY ELLIPTA ) 100-62.5-25 MCG/ACT AEPB     Other   Anxiety   Under good control on current regimen. Continue current regimen. Continue to monitor. Call with any concerns. Refills given for 1 month. Follow up 1 month.        Relevant Medications   LORazepam  (ATIVAN ) 0.5 MG tablet   traZODone  (DESYREL ) 50 MG tablet   B12 deficiency   Rechecking labs today. Await results. Treat as needed.       Relevant Orders   B12 (Completed)   Vitamin D  deficiency   Rechecking labs today. Await results. Treat as needed.       Relevant Orders   VITAMIN D  25 Hydroxy (Vit-D Deficiency, Fractures) (Completed)   Hyponatremia   Rechecking labs today. Await results. Treat as needed. Concern for celexa  contributing to this.  Mixed hyperlipidemia   Under good control on current regimen. Continue current regimen. Continue to monitor. Call with any concerns. Refills given. Labs drawn today.        Relevant Medications   ramipril  (ALTACE ) 10 MG capsule   rosuvastatin  (CRESTOR ) 5 MG tablet   Other Relevant Orders   CBC with Differential/Platelet (Completed)   Comprehensive metabolic panel with GFR (Completed)   Lipid Panel w/o Chol/HDL Ratio (Completed)   Panic attacks   Discussed taking her lorazepam  only 1x a day. Refill given today. Follow up 1 month. Call with any concerns.       Relevant Medications   LORazepam  (ATIVAN ) 0.5 MG tablet   traZODone  (DESYREL ) 50 MG tablet   Other Visit Diagnoses       Peripheral edema       Concern that this is coming from amlodipine . Will switch back to ramipril  and recheck in about a month. Call with any concerns.   Relevant Orders   B Nat Peptide (Completed)     Gastroesophageal reflux disease, unspecified whether esophagitis present       Relevant Medications   famotidine  (PEPCID ) 20 MG tablet     Need for pneumococcal vaccine       Prevnar given today.   Relevant Orders    Pneumococcal conjugate vaccine 20-valent (Prevnar 20) (Completed)        Follow up plan: Return in 4 weeks (on 11/21/2024), or Welcome to Medicare (40 min), for records release from [facility].         [1]  Allergies Allergen Reactions   Zithromax  [Azithromycin ] Nausea And Vomiting   Quetiapine  Other (See Comments)    nightmares   Buspar  [Buspirone ] Other (See Comments)    Made her feel crazy   "

## 2024-10-24 NOTE — Telephone Encounter (Signed)
 Copied from CRM #[id number]. Topic: Clinical - Medication Refill >> Oct 24, 2024 11:57 AM Kendralyn S wrote: Medication: ramipril  (ALTACE ) 10 MG capsule (Patient threw away old bottle because in er they prescribed amlodipine )  Has the patient contacted their pharmacy? Yes (Agent: If no, request that the patient contact the pharmacy for the refill. If patient does not wish to contact the pharmacy document the reason why and proceed with request.) (Agent: If yes, when and what did the pharmacy advise?)  This is the patient's preferred pharmacy:  [facility] - [place], [place] - [address] [place] [address] Phone: [phone] Fax: [phone]  Is this the correct pharmacy for this prescription? Yes If no, delete pharmacy and type the correct one.   Has the prescription been filled recently? No  Is the patient out of the medication? Yes  Has the patient been seen for an appointment in the last year OR does the patient have an upcoming appointment? Yes  Can we respond through MyChart? Yes  Agent: Please be advised that Rx refills may take up to 3 business days. We ask that you follow-up with your pharmacy.

## 2024-10-24 NOTE — Telephone Encounter (Signed)
 Requested Prescriptions  Refused Prescriptions Disp Refills   ramipril  (ALTACE ) 10 MG capsule 90 capsule 0    Sig: Take 1 capsule (10 mg total) by mouth daily.     Cardiovascular:  ACE Inhibitors Passed - 10/24/2024  5:47 PM      Passed - Cr in normal range and within 180 days    Creatinine, Ser  Date Value Ref Range Status  10/09/2024 0.56 0.44 - 1.00 mg/dL Final   Creatinine, Urine  Date Value Ref Range Status  09/22/2023 43 mg/dL Final    Comment:    Performed at [facility], [address] [place], [place] [address]         Passed - K in normal range and within 180 days    Potassium  Date Value Ref Range Status  10/09/2024 4.7 3.5 - 5.1 mmol/L Final    Comment:    HEMOLYSIS AT THIS LEVEL MAY AFFECT RESULT         Passed - Patient is not pregnant      Passed - Last BP in normal range    BP Readings from Last 1 Encounters:  10/24/24 113/68         Passed - Valid encounter within last 6 months    Recent Outpatient Visits           Today Essential hypertension, benign   [place] [facility] [place], Megan P, DO   2 months ago COPD exacerbation [facility])   [place] [facility] Herold Hadassah SQUIBB, MD   4 months ago COPD with acute exacerbation [facility])   [place] [facility] Herold Hadassah SQUIBB, MD   5 months ago Anxiety   [facility] Herold Hadassah SQUIBB, MD   6 months ago Chronic obstructive pulmonary disease with acute exacerbation [facility])   [place] [facility] Herold Hadassah SQUIBB, MD

## 2024-10-25 ENCOUNTER — Telehealth: Payer: Self-pay | Admitting: Family Medicine

## 2024-10-25 ENCOUNTER — Ambulatory Visit: Payer: Self-pay

## 2024-10-25 NOTE — Telephone Encounter (Signed)
 FYI Only or Action Required?: Action required by provider: update on patient condition.  Patient was last seen in primary care on 10/24/2024 by Vicci Duwaine SQUIBB, DO.  Called Nurse Triage reporting Cough.  Symptoms began a week ago.  Interventions attempted: Prescription medications: Duoneb once yesterday and once today.  Symptoms are: gradually worsening.  Triage Disposition: Call PCP Now  Patient/caregiver understands and will follow disposition?: Yes                   Message from Selinda RAMAN sent at 10/25/2024  9:56 AM EST  Summary: Shortness of breath, bad cough, yellow mucous   Reason for Triage: The patient called in today stating she saw her provider yesterday for shortness of breath and bad cough. She was not prescribed anything with her provider thinking it was coming from her Copd. She says she is a lot worse today and that she is coughing up infection. She says it is yellow mucous. She would like to speak with a nurse. I will transfer her to E2C2 NT.         Reason for Disposition  [1] Recent medical visit within 24 hours AND [2] condition / symptoms WORSE  Answer Assessment - Initial Assessment Questions Feels less like when she is getting infection, worse than baseline COPD. Getting harder and harder to cough this up.   1. MAIN CONCERN OR SYMPTOM:  What is your main concern right now? What question do you have? What's the main symptom you're worried about? (e.g., breathing difficulty, cough, fever, pain)     SOB walking to car and back to house, scratchy throat, cough with yellow thick mucous  started yesterday and worse today.  2. ONSET: When did the  symptoms  start?     X 1 week.  3. BETTER-SAME-WORSE: Are you getting better, staying the same, or getting worse compared to how you felt at your last visit to the doctor (most recent medical visit)?     Worse.  4. VISIT DATE: When were you seen? (e.g., date)     Yesterday 10/24/24  5.  VISIT DOCTOR: What is the name of the doctor taking care of you now?     Dr Vicci.  6. VISIT DIAGNOSIS:  What was the main symptom or problem that you were seen for? Were you given a diagnosis?      COPD.  7. VISIT MEDICINES: Did the doctor order any new medicines for you to use? If Yes, ask: Have you filled the prescription and started taking the medicine?      No.  8. NEXT APPOINTMENT: Have you scheduled a follow-up appointment with your doctor?     12/09/24.  9. PAIN: Is there any pain? If Yes, ask: How bad is it?  (Scale 0-10; or none, mild, moderate, severe)     Scratchy throat. More of a discomfort than pain  10. FEVER: Do you have a fever? If Yes, ask: What is it, how was it measured  and when did it start?       No.  11. OTHER SYMPTOMS: Do you have any other symptoms?       No dehydration.  Protocols used: Recent Medical Visit for Illness Follow-up Call-[facility]

## 2024-10-25 NOTE — Telephone Encounter (Signed)
 patient is calling back for an update on medication prednisone . Advised that the message was sent to provider just waiting on an response.

## 2024-10-25 NOTE — Telephone Encounter (Signed)
 Copied from CRM #[id number]. Topic: Clinical - Medication Question >> Oct 25, 2024  8:09 AM Avram MATSU wrote: Reason for CRM: patient has copd and would like to be prescribed prednisone . Was seen yesterday by her provider  [facility] - [place], [place] - [address] [place] [address] Phone: [phone] Fax: [phone]

## 2024-10-26 ENCOUNTER — Ambulatory Visit: Payer: Self-pay | Admitting: Family Medicine

## 2024-10-26 DIAGNOSIS — D75839 Thrombocytosis, unspecified: Secondary | ICD-10-CM

## 2024-10-26 LAB — CBC WITH DIFFERENTIAL/PLATELET
Basophils Absolute: 0.1 10*3/uL (ref 0.0–0.2)
Basos: 1 %
EOS (ABSOLUTE): 0.2 10*3/uL (ref 0.0–0.4)
Eos: 2 %
Hematocrit: 38.8 % (ref 34.0–46.6)
Hemoglobin: 12.4 g/dL (ref 11.1–15.9)
Immature Grans (Abs): 0 10*3/uL (ref 0.0–0.1)
Immature Granulocytes: 0 %
Lymphocytes Absolute: 1.2 10*3/uL (ref 0.7–3.1)
Lymphs: 13 %
MCH: 31.1 pg (ref 26.6–33.0)
MCHC: 32 g/dL (ref 31.5–35.7)
MCV: 97 fL (ref 79–97)
Monocytes Absolute: 0.8 10*3/uL (ref 0.1–0.9)
Monocytes: 9 %
Neutrophils Absolute: 7 10*3/uL (ref 1.4–7.0)
Neutrophils: 75 %
Platelets: 834 10*3/uL (ref 150–450)
RBC: 3.99 x10E6/uL (ref 3.77–5.28)
RDW: 12.1 % (ref 11.7–15.4)
WBC: 9.3 10*3/uL (ref 3.4–10.8)

## 2024-10-26 LAB — LIPID PANEL W/O CHOL/HDL RATIO
Cholesterol, Total: 179 mg/dL (ref 100–199)
HDL: 70 mg/dL
LDL Chol Calc (NIH): 95 mg/dL (ref 0–99)
Triglycerides: 77 mg/dL (ref 0–149)
VLDL Cholesterol Cal: 14 mg/dL (ref 5–40)

## 2024-10-26 LAB — COMPREHENSIVE METABOLIC PANEL WITH GFR
ALT: 22 [IU]/L (ref 0–32)
AST: 22 [IU]/L (ref 0–40)
Albumin: 4.2 g/dL (ref 3.9–4.9)
Alkaline Phosphatase: 85 [IU]/L (ref 49–135)
BUN/Creatinine Ratio: 17 (ref 12–28)
BUN: 12 mg/dL (ref 8–27)
Bilirubin Total: 0.2 mg/dL (ref 0.0–1.2)
CO2: 22 mmol/L (ref 20–29)
Calcium: 9.5 mg/dL (ref 8.7–10.3)
Chloride: 95 mmol/L — ABNORMAL LOW (ref 96–106)
Creatinine, Ser: 0.72 mg/dL (ref 0.57–1.00)
Globulin, Total: 1.9 g/dL (ref 1.5–4.5)
Glucose: 91 mg/dL (ref 70–99)
Potassium: 4.5 mmol/L (ref 3.5–5.2)
Sodium: 134 mmol/L (ref 134–144)
Total Protein: 6.1 g/dL (ref 6.0–8.5)
eGFR: 93 mL/min/{1.73_m2}

## 2024-10-26 LAB — VITAMIN B12: Vitamin B-12: 581 pg/mL (ref 232–1245)

## 2024-10-26 LAB — BRAIN NATRIURETIC PEPTIDE: BNP: 78.8 pg/mL (ref 0.0–100.0)

## 2024-10-26 LAB — VITAMIN D 25 HYDROXY (VIT D DEFICIENCY, FRACTURES): Vit D, 25-Hydroxy: 45.1 ng/mL (ref 30.0–100.0)

## 2024-10-27 NOTE — Assessment & Plan Note (Signed)
 Rechecking labs today. Await results. Treat as needed. Concern for celexa  contributing to this.

## 2024-10-27 NOTE — Assessment & Plan Note (Signed)
 Rechecking labs today. Await results. Treat as needed.

## 2024-10-27 NOTE — Assessment & Plan Note (Signed)
 Discussed taking her lorazepam  only 1x a day. Refill given today. Follow up 1 month. Call with any concerns.

## 2024-10-27 NOTE — Assessment & Plan Note (Signed)
 Under good control on current regimen. Continue current regimen. Continue to monitor. Call with any concerns. Refills given. Labs drawn today.

## 2024-10-27 NOTE — Assessment & Plan Note (Signed)
+   Edema on amlodipine - will change back to ramipril . Follow up 1 month. Call with any concerns.

## 2024-10-27 NOTE — Assessment & Plan Note (Signed)
 Under good control on current regimen. Continue current regimen. Continue to monitor. Call with any concerns. Refills given for 1 month. Follow up 1 month.

## 2024-10-27 NOTE — Assessment & Plan Note (Signed)
 Lungs clear. Continue inhalers. Continue to monitor. Call with any concerns.

## 2024-10-29 MED ORDER — PREDNISONE 50 MG PO TABS: 50.0000 mg | ORAL_TABLET | Freq: Every day | ORAL | 0 refills | Status: AC

## 2024-11-05 ENCOUNTER — Encounter: Payer: Self-pay | Admitting: Family Medicine

## 2024-12-09 ENCOUNTER — Ambulatory Visit: Admitting: Family Medicine

## 2025-02-03 ENCOUNTER — Encounter: Admitting: Family Medicine
